# Patient Record
Sex: Female | Born: 1960
Health system: Southern US, Community
[De-identification: ages and names within clinical notes are randomized; demographics above are authoritative.]

## PROBLEM LIST (undated history)

## (undated) DIAGNOSIS — F79 Unspecified intellectual disabilities: Secondary | ICD-10-CM

## (undated) DIAGNOSIS — F419 Anxiety disorder, unspecified: Secondary | ICD-10-CM

## (undated) DIAGNOSIS — C50919 Malignant neoplasm of unspecified site of unspecified female breast: Secondary | ICD-10-CM

## (undated) DIAGNOSIS — K219 Gastro-esophageal reflux disease without esophagitis: Secondary | ICD-10-CM

## (undated) DIAGNOSIS — R1310 Dysphagia, unspecified: Secondary | ICD-10-CM

## (undated) DIAGNOSIS — C801 Malignant (primary) neoplasm, unspecified: Secondary | ICD-10-CM

## (undated) DIAGNOSIS — I10 Essential (primary) hypertension: Secondary | ICD-10-CM

## (undated) DIAGNOSIS — Z22322 Carrier or suspected carrier of Methicillin resistant Staphylococcus aureus: Secondary | ICD-10-CM

## (undated) DIAGNOSIS — F32A Depression, unspecified: Secondary | ICD-10-CM

## (undated) DIAGNOSIS — R296 Repeated falls: Secondary | ICD-10-CM

## (undated) DIAGNOSIS — M810 Age-related osteoporosis without current pathological fracture: Secondary | ICD-10-CM

## (undated) DIAGNOSIS — M1990 Unspecified osteoarthritis, unspecified site: Secondary | ICD-10-CM

## (undated) DIAGNOSIS — E785 Hyperlipidemia, unspecified: Secondary | ICD-10-CM

## (undated) DIAGNOSIS — Z8719 Personal history of other diseases of the digestive system: Secondary | ICD-10-CM

## (undated) DIAGNOSIS — L732 Hidradenitis suppurativa: Secondary | ICD-10-CM

## (undated) DIAGNOSIS — R569 Unspecified convulsions: Secondary | ICD-10-CM

## (undated) DIAGNOSIS — F329 Major depressive disorder, single episode, unspecified: Secondary | ICD-10-CM

## (undated) DIAGNOSIS — W19XXXA Unspecified fall, initial encounter: Secondary | ICD-10-CM

## (undated) DIAGNOSIS — J189 Pneumonia, unspecified organism: Secondary | ICD-10-CM

## (undated) DIAGNOSIS — H269 Unspecified cataract: Secondary | ICD-10-CM

## (undated) HISTORY — DX: Age-related osteoporosis without current pathological fracture: M81.0

## (undated) HISTORY — DX: Unspecified intellectual disabilities: F79

## (undated) HISTORY — DX: Carrier or suspected carrier of methicillin resistant Staphylococcus aureus: Z22.322

## (undated) HISTORY — DX: Hidradenitis suppurativa: L73.2

## (undated) HISTORY — DX: Essential (primary) hypertension: I10

## (undated) HISTORY — DX: Gastro-esophageal reflux disease without esophagitis: K21.9

## (undated) HISTORY — DX: Malignant (primary) neoplasm, unspecified: C80.1

## (undated) HISTORY — PX: CERVICAL ABLATION: SHX5771

## (undated) HISTORY — DX: Repeated falls: R29.6

## (undated) HISTORY — DX: Unspecified cataract: H26.9

## (undated) HISTORY — DX: Unspecified fall, initial encounter: W19.XXXA

## (undated) HISTORY — PX: OTHER SURGICAL HISTORY: SHX169

## (undated) HISTORY — DX: Unspecified convulsions: R56.9

---

## 2004-08-20 ENCOUNTER — Ambulatory Visit: Payer: Self-pay | Admitting: Family Medicine

## 2004-12-03 ENCOUNTER — Ambulatory Visit: Payer: Self-pay | Admitting: Unknown Physician Specialty

## 2004-12-07 ENCOUNTER — Ambulatory Visit: Payer: Self-pay | Admitting: Unknown Physician Specialty

## 2007-06-11 ENCOUNTER — Ambulatory Visit: Payer: Self-pay | Admitting: Family Medicine

## 2009-05-10 ENCOUNTER — Ambulatory Visit: Payer: Self-pay | Admitting: Family Medicine

## 2009-05-17 LAB — HM DEXA SCAN

## 2010-02-11 DIAGNOSIS — L732 Hidradenitis suppurativa: Secondary | ICD-10-CM

## 2010-02-11 DIAGNOSIS — Z22322 Carrier or suspected carrier of Methicillin resistant Staphylococcus aureus: Secondary | ICD-10-CM

## 2010-02-11 HISTORY — DX: Hidradenitis suppurativa: L73.2

## 2010-02-11 HISTORY — DX: Carrier or suspected carrier of methicillin resistant Staphylococcus aureus: Z22.322

## 2010-08-22 ENCOUNTER — Ambulatory Visit: Payer: Self-pay | Admitting: Family Medicine

## 2010-10-30 ENCOUNTER — Inpatient Hospital Stay: Payer: Self-pay | Admitting: Specialist

## 2011-02-20 DIAGNOSIS — L0390 Cellulitis, unspecified: Secondary | ICD-10-CM | POA: Diagnosis not present

## 2011-02-20 DIAGNOSIS — N390 Urinary tract infection, site not specified: Secondary | ICD-10-CM | POA: Diagnosis not present

## 2011-02-20 DIAGNOSIS — L0291 Cutaneous abscess, unspecified: Secondary | ICD-10-CM | POA: Diagnosis not present

## 2011-02-20 DIAGNOSIS — Q02 Microcephaly: Secondary | ICD-10-CM | POA: Diagnosis not present

## 2011-02-20 DIAGNOSIS — B953 Streptococcus pneumoniae as the cause of diseases classified elsewhere: Secondary | ICD-10-CM | POA: Diagnosis not present

## 2011-03-19 DIAGNOSIS — F39 Unspecified mood [affective] disorder: Secondary | ICD-10-CM | POA: Diagnosis not present

## 2011-04-08 DIAGNOSIS — L0390 Cellulitis, unspecified: Secondary | ICD-10-CM | POA: Diagnosis not present

## 2011-04-10 DIAGNOSIS — A4902 Methicillin resistant Staphylococcus aureus infection, unspecified site: Secondary | ICD-10-CM | POA: Diagnosis not present

## 2011-04-10 DIAGNOSIS — L02429 Furuncle of limb, unspecified: Secondary | ICD-10-CM | POA: Diagnosis not present

## 2011-04-10 DIAGNOSIS — L0291 Cutaneous abscess, unspecified: Secondary | ICD-10-CM | POA: Diagnosis not present

## 2011-04-10 DIAGNOSIS — L02229 Furuncle of trunk, unspecified: Secondary | ICD-10-CM | POA: Diagnosis not present

## 2011-04-10 DIAGNOSIS — L02439 Carbuncle of limb, unspecified: Secondary | ICD-10-CM | POA: Diagnosis not present

## 2011-04-10 DIAGNOSIS — L0390 Cellulitis, unspecified: Secondary | ICD-10-CM | POA: Diagnosis not present

## 2011-04-15 ENCOUNTER — Encounter: Payer: Self-pay | Admitting: Cardiothoracic Surgery

## 2011-04-15 ENCOUNTER — Encounter: Payer: Self-pay | Admitting: Nurse Practitioner

## 2011-04-15 DIAGNOSIS — F82 Specific developmental disorder of motor function: Secondary | ICD-10-CM | POA: Diagnosis not present

## 2011-04-15 DIAGNOSIS — F72 Severe intellectual disabilities: Secondary | ICD-10-CM | POA: Diagnosis not present

## 2011-04-15 DIAGNOSIS — L732 Hidradenitis suppurativa: Secondary | ICD-10-CM | POA: Diagnosis not present

## 2011-04-15 DIAGNOSIS — F411 Generalized anxiety disorder: Secondary | ICD-10-CM | POA: Diagnosis not present

## 2011-04-17 DIAGNOSIS — H543 Unqualified visual loss, both eyes: Secondary | ICD-10-CM | POA: Diagnosis not present

## 2011-04-17 DIAGNOSIS — F82 Specific developmental disorder of motor function: Secondary | ICD-10-CM | POA: Diagnosis not present

## 2011-04-17 DIAGNOSIS — F72 Severe intellectual disabilities: Secondary | ICD-10-CM | POA: Diagnosis not present

## 2011-04-17 DIAGNOSIS — F411 Generalized anxiety disorder: Secondary | ICD-10-CM | POA: Diagnosis not present

## 2011-04-17 DIAGNOSIS — L732 Hidradenitis suppurativa: Secondary | ICD-10-CM | POA: Diagnosis not present

## 2011-04-19 DIAGNOSIS — F72 Severe intellectual disabilities: Secondary | ICD-10-CM | POA: Diagnosis not present

## 2011-04-19 DIAGNOSIS — L732 Hidradenitis suppurativa: Secondary | ICD-10-CM | POA: Diagnosis not present

## 2011-04-19 DIAGNOSIS — H543 Unqualified visual loss, both eyes: Secondary | ICD-10-CM | POA: Diagnosis not present

## 2011-04-19 DIAGNOSIS — F411 Generalized anxiety disorder: Secondary | ICD-10-CM | POA: Diagnosis not present

## 2011-04-19 DIAGNOSIS — F82 Specific developmental disorder of motor function: Secondary | ICD-10-CM | POA: Diagnosis not present

## 2011-04-23 DIAGNOSIS — F72 Severe intellectual disabilities: Secondary | ICD-10-CM | POA: Diagnosis not present

## 2011-04-23 DIAGNOSIS — L732 Hidradenitis suppurativa: Secondary | ICD-10-CM | POA: Diagnosis not present

## 2011-04-23 DIAGNOSIS — F82 Specific developmental disorder of motor function: Secondary | ICD-10-CM | POA: Diagnosis not present

## 2011-04-23 DIAGNOSIS — F411 Generalized anxiety disorder: Secondary | ICD-10-CM | POA: Diagnosis not present

## 2011-04-23 DIAGNOSIS — H543 Unqualified visual loss, both eyes: Secondary | ICD-10-CM | POA: Diagnosis not present

## 2011-04-26 DIAGNOSIS — L732 Hidradenitis suppurativa: Secondary | ICD-10-CM | POA: Diagnosis not present

## 2011-04-26 DIAGNOSIS — F411 Generalized anxiety disorder: Secondary | ICD-10-CM | POA: Diagnosis not present

## 2011-04-26 DIAGNOSIS — H543 Unqualified visual loss, both eyes: Secondary | ICD-10-CM | POA: Diagnosis not present

## 2011-04-26 DIAGNOSIS — F82 Specific developmental disorder of motor function: Secondary | ICD-10-CM | POA: Diagnosis not present

## 2011-04-26 DIAGNOSIS — F72 Severe intellectual disabilities: Secondary | ICD-10-CM | POA: Diagnosis not present

## 2011-05-03 DIAGNOSIS — F72 Severe intellectual disabilities: Secondary | ICD-10-CM | POA: Diagnosis not present

## 2011-05-03 DIAGNOSIS — F82 Specific developmental disorder of motor function: Secondary | ICD-10-CM | POA: Diagnosis not present

## 2011-05-03 DIAGNOSIS — F411 Generalized anxiety disorder: Secondary | ICD-10-CM | POA: Diagnosis not present

## 2011-05-03 DIAGNOSIS — H543 Unqualified visual loss, both eyes: Secondary | ICD-10-CM | POA: Diagnosis not present

## 2011-05-03 DIAGNOSIS — L732 Hidradenitis suppurativa: Secondary | ICD-10-CM | POA: Diagnosis not present

## 2011-05-13 ENCOUNTER — Encounter: Payer: Self-pay | Admitting: Cardiothoracic Surgery

## 2011-05-13 ENCOUNTER — Encounter: Payer: Self-pay | Admitting: Nurse Practitioner

## 2011-05-13 DIAGNOSIS — F411 Generalized anxiety disorder: Secondary | ICD-10-CM | POA: Diagnosis not present

## 2011-05-13 DIAGNOSIS — L732 Hidradenitis suppurativa: Secondary | ICD-10-CM | POA: Diagnosis not present

## 2011-05-13 DIAGNOSIS — F72 Severe intellectual disabilities: Secondary | ICD-10-CM | POA: Diagnosis not present

## 2011-05-13 DIAGNOSIS — F82 Specific developmental disorder of motor function: Secondary | ICD-10-CM | POA: Diagnosis not present

## 2011-05-27 DIAGNOSIS — E7800 Pure hypercholesterolemia, unspecified: Secondary | ICD-10-CM | POA: Diagnosis not present

## 2011-05-27 DIAGNOSIS — K2100 Gastro-esophageal reflux disease with esophagitis, without bleeding: Secondary | ICD-10-CM | POA: Diagnosis not present

## 2011-05-27 DIAGNOSIS — F3289 Other specified depressive episodes: Secondary | ICD-10-CM | POA: Diagnosis not present

## 2011-05-27 DIAGNOSIS — F329 Major depressive disorder, single episode, unspecified: Secondary | ICD-10-CM | POA: Diagnosis not present

## 2011-05-27 DIAGNOSIS — F411 Generalized anxiety disorder: Secondary | ICD-10-CM | POA: Diagnosis not present

## 2011-06-18 DIAGNOSIS — F39 Unspecified mood [affective] disorder: Secondary | ICD-10-CM | POA: Diagnosis not present

## 2011-09-04 DIAGNOSIS — E7800 Pure hypercholesterolemia, unspecified: Secondary | ICD-10-CM | POA: Diagnosis not present

## 2011-09-04 DIAGNOSIS — Q02 Microcephaly: Secondary | ICD-10-CM | POA: Diagnosis not present

## 2011-09-04 DIAGNOSIS — F79 Unspecified intellectual disabilities: Secondary | ICD-10-CM | POA: Diagnosis not present

## 2011-09-04 DIAGNOSIS — R259 Unspecified abnormal involuntary movements: Secondary | ICD-10-CM | POA: Diagnosis not present

## 2011-09-12 DIAGNOSIS — E785 Hyperlipidemia, unspecified: Secondary | ICD-10-CM | POA: Diagnosis not present

## 2011-09-12 DIAGNOSIS — I10 Essential (primary) hypertension: Secondary | ICD-10-CM | POA: Diagnosis not present

## 2011-09-12 DIAGNOSIS — R5381 Other malaise: Secondary | ICD-10-CM | POA: Diagnosis not present

## 2011-09-12 DIAGNOSIS — G40909 Epilepsy, unspecified, not intractable, without status epilepticus: Secondary | ICD-10-CM | POA: Diagnosis not present

## 2011-09-17 DIAGNOSIS — F39 Unspecified mood [affective] disorder: Secondary | ICD-10-CM | POA: Diagnosis not present

## 2011-10-10 ENCOUNTER — Ambulatory Visit: Payer: Self-pay | Admitting: Family Medicine

## 2011-10-10 DIAGNOSIS — Z1231 Encounter for screening mammogram for malignant neoplasm of breast: Secondary | ICD-10-CM | POA: Diagnosis not present

## 2011-10-28 DIAGNOSIS — I10 Essential (primary) hypertension: Secondary | ICD-10-CM | POA: Diagnosis not present

## 2011-10-28 DIAGNOSIS — F79 Unspecified intellectual disabilities: Secondary | ICD-10-CM | POA: Diagnosis not present

## 2011-10-28 DIAGNOSIS — R5381 Other malaise: Secondary | ICD-10-CM | POA: Diagnosis not present

## 2011-10-28 DIAGNOSIS — R5383 Other fatigue: Secondary | ICD-10-CM | POA: Diagnosis not present

## 2011-10-28 DIAGNOSIS — E7800 Pure hypercholesterolemia, unspecified: Secondary | ICD-10-CM | POA: Diagnosis not present

## 2011-11-28 DIAGNOSIS — F79 Unspecified intellectual disabilities: Secondary | ICD-10-CM | POA: Diagnosis not present

## 2011-11-28 DIAGNOSIS — E7800 Pure hypercholesterolemia, unspecified: Secondary | ICD-10-CM | POA: Diagnosis not present

## 2011-11-28 DIAGNOSIS — Z23 Encounter for immunization: Secondary | ICD-10-CM | POA: Diagnosis not present

## 2011-12-04 DIAGNOSIS — R569 Unspecified convulsions: Secondary | ICD-10-CM | POA: Diagnosis not present

## 2011-12-04 DIAGNOSIS — F919 Conduct disorder, unspecified: Secondary | ICD-10-CM | POA: Diagnosis not present

## 2011-12-07 ENCOUNTER — Emergency Department: Payer: Self-pay | Admitting: Emergency Medicine

## 2011-12-07 DIAGNOSIS — I10 Essential (primary) hypertension: Secondary | ICD-10-CM | POA: Diagnosis not present

## 2011-12-07 DIAGNOSIS — N200 Calculus of kidney: Secondary | ICD-10-CM | POA: Diagnosis not present

## 2011-12-07 DIAGNOSIS — R1011 Right upper quadrant pain: Secondary | ICD-10-CM | POA: Diagnosis not present

## 2011-12-07 DIAGNOSIS — R509 Fever, unspecified: Secondary | ICD-10-CM | POA: Diagnosis not present

## 2011-12-07 DIAGNOSIS — R9431 Abnormal electrocardiogram [ECG] [EKG]: Secondary | ICD-10-CM | POA: Diagnosis not present

## 2011-12-07 DIAGNOSIS — R109 Unspecified abdominal pain: Secondary | ICD-10-CM | POA: Diagnosis not present

## 2011-12-07 DIAGNOSIS — Z79899 Other long term (current) drug therapy: Secondary | ICD-10-CM | POA: Diagnosis not present

## 2011-12-07 LAB — COMPREHENSIVE METABOLIC PANEL
Albumin: 4.2 g/dL (ref 3.4–5.0)
Alkaline Phosphatase: 115 U/L (ref 50–136)
BUN: 14 mg/dL (ref 7–18)
Bilirubin,Total: 0.3 mg/dL (ref 0.2–1.0)
Co2: 30 mmol/L (ref 21–32)
Creatinine: 0.57 mg/dL — ABNORMAL LOW (ref 0.60–1.30)
EGFR (African American): >60
Glucose: 119 mg/dL — ABNORMAL HIGH (ref 65–99)
SGOT(AST): 30 U/L (ref 15–37)
SGPT (ALT): 31 U/L (ref 12–78)
Total Protein: 7.1 g/dL (ref 6.4–8.2)

## 2011-12-07 LAB — CBC
MCHC: 33.5 g/dL (ref 32.0–36.0)
Platelet: 248 10*3/uL (ref 150–440)
RBC: 4.24 10*6/uL (ref 3.80–5.20)
RDW: 13.8 % (ref 11.5–14.5)

## 2011-12-07 LAB — URINALYSIS, COMPLETE
Bilirubin,UR: NEGATIVE
Glucose,UR: NEGATIVE mg/dL (ref 0–75)
Protein: NEGATIVE
RBC,UR: 68 /HPF (ref 0–5)
Specific Gravity: 1.017 (ref 1.003–1.030)
Squamous Epithelial: 4
WBC UR: NONE SEEN /HPF (ref 0–5)

## 2011-12-07 LAB — LIPASE, BLOOD: Lipase: 176 U/L (ref 73–393)

## 2011-12-13 DIAGNOSIS — E7800 Pure hypercholesterolemia, unspecified: Secondary | ICD-10-CM | POA: Diagnosis not present

## 2011-12-13 DIAGNOSIS — R1310 Dysphagia, unspecified: Secondary | ICD-10-CM | POA: Diagnosis not present

## 2011-12-13 DIAGNOSIS — F09 Unspecified mental disorder due to known physiological condition: Secondary | ICD-10-CM | POA: Diagnosis not present

## 2011-12-13 DIAGNOSIS — N200 Calculus of kidney: Secondary | ICD-10-CM | POA: Diagnosis not present

## 2011-12-26 ENCOUNTER — Ambulatory Visit: Payer: Self-pay | Admitting: Family Medicine

## 2011-12-26 DIAGNOSIS — R1310 Dysphagia, unspecified: Secondary | ICD-10-CM | POA: Diagnosis not present

## 2011-12-28 ENCOUNTER — Emergency Department: Payer: Self-pay | Admitting: Unknown Physician Specialty

## 2011-12-28 DIAGNOSIS — R569 Unspecified convulsions: Secondary | ICD-10-CM | POA: Diagnosis not present

## 2011-12-28 DIAGNOSIS — I10 Essential (primary) hypertension: Secondary | ICD-10-CM | POA: Diagnosis not present

## 2011-12-28 DIAGNOSIS — G40909 Epilepsy, unspecified, not intractable, without status epilepticus: Secondary | ICD-10-CM | POA: Diagnosis not present

## 2011-12-28 DIAGNOSIS — I498 Other specified cardiac arrhythmias: Secondary | ICD-10-CM | POA: Diagnosis not present

## 2011-12-28 LAB — COMPREHENSIVE METABOLIC PANEL
Alkaline Phosphatase: 112 U/L (ref 50–136)
BUN: 14 mg/dL (ref 7–18)
Bilirubin,Total: 0.4 mg/dL (ref 0.2–1.0)
Calcium, Total: 8.9 mg/dL (ref 8.5–10.1)
Chloride: 100 mmol/L (ref 98–107)
Co2: 24 mmol/L (ref 21–32)
Creatinine: 0.66 mg/dL (ref 0.60–1.30)
Osmolality: 277 (ref 275–301)
SGPT (ALT): 56 U/L (ref 12–78)
Total Protein: 7.5 g/dL (ref 6.4–8.2)

## 2011-12-28 LAB — CBC
HCT: 42.6 % (ref 35.0–47.0)
MCH: 31.5 pg (ref 26.0–34.0)
MCHC: 32.9 g/dL (ref 32.0–36.0)
MCV: 96 fL (ref 80–100)
Platelet: 269 10*3/uL (ref 150–440)
RBC: 4.45 10*6/uL (ref 3.80–5.20)
WBC: 11.2 10*3/uL — ABNORMAL HIGH (ref 3.6–11.0)

## 2011-12-28 LAB — URINALYSIS, COMPLETE
Ketone: NEGATIVE
Nitrite: NEGATIVE
Ph: 7 (ref 4.5–8.0)
Protein: NEGATIVE
RBC,UR: 30 /HPF (ref 0–5)
Specific Gravity: 1.008 (ref 1.003–1.030)
Squamous Epithelial: <1

## 2011-12-28 LAB — TSH: Thyroid Stimulating Horm: 2.55 u[IU]/mL

## 2011-12-28 LAB — PHENYTOIN LEVEL, TOTAL: Dilantin: 2.1 ug/mL — ABNORMAL LOW (ref 10.0–20.0)

## 2011-12-28 LAB — TROPONIN I: Troponin-I: <0.02 ng/mL

## 2012-01-02 DIAGNOSIS — G40919 Epilepsy, unspecified, intractable, without status epilepticus: Secondary | ICD-10-CM | POA: Diagnosis not present

## 2012-01-02 DIAGNOSIS — G808 Other cerebral palsy: Secondary | ICD-10-CM | POA: Diagnosis not present

## 2012-01-25 DIAGNOSIS — I10 Essential (primary) hypertension: Secondary | ICD-10-CM | POA: Diagnosis not present

## 2012-01-25 DIAGNOSIS — R1312 Dysphagia, oropharyngeal phase: Secondary | ICD-10-CM | POA: Diagnosis not present

## 2012-01-25 DIAGNOSIS — R569 Unspecified convulsions: Secondary | ICD-10-CM | POA: Diagnosis not present

## 2012-01-25 DIAGNOSIS — F79 Unspecified intellectual disabilities: Secondary | ICD-10-CM | POA: Diagnosis not present

## 2012-01-25 DIAGNOSIS — IMO0001 Reserved for inherently not codable concepts without codable children: Secondary | ICD-10-CM | POA: Diagnosis not present

## 2012-02-20 DIAGNOSIS — F79 Unspecified intellectual disabilities: Secondary | ICD-10-CM | POA: Diagnosis not present

## 2012-02-20 DIAGNOSIS — I10 Essential (primary) hypertension: Secondary | ICD-10-CM | POA: Diagnosis not present

## 2012-02-20 DIAGNOSIS — R1312 Dysphagia, oropharyngeal phase: Secondary | ICD-10-CM | POA: Diagnosis not present

## 2012-02-20 DIAGNOSIS — R569 Unspecified convulsions: Secondary | ICD-10-CM | POA: Diagnosis not present

## 2012-02-25 DIAGNOSIS — W19XXXA Unspecified fall, initial encounter: Secondary | ICD-10-CM | POA: Diagnosis not present

## 2012-02-25 DIAGNOSIS — F79 Unspecified intellectual disabilities: Secondary | ICD-10-CM | POA: Diagnosis not present

## 2012-03-03 DIAGNOSIS — R300 Dysuria: Secondary | ICD-10-CM | POA: Diagnosis not present

## 2012-03-03 DIAGNOSIS — R112 Nausea with vomiting, unspecified: Secondary | ICD-10-CM | POA: Diagnosis not present

## 2012-03-17 DIAGNOSIS — F39 Unspecified mood [affective] disorder: Secondary | ICD-10-CM | POA: Diagnosis not present

## 2012-03-19 DIAGNOSIS — R059 Cough, unspecified: Secondary | ICD-10-CM | POA: Diagnosis not present

## 2012-03-19 DIAGNOSIS — J209 Acute bronchitis, unspecified: Secondary | ICD-10-CM | POA: Diagnosis not present

## 2012-03-19 DIAGNOSIS — J988 Other specified respiratory disorders: Secondary | ICD-10-CM | POA: Diagnosis not present

## 2012-03-19 DIAGNOSIS — R05 Cough: Secondary | ICD-10-CM | POA: Diagnosis not present

## 2012-03-19 DIAGNOSIS — R569 Unspecified convulsions: Secondary | ICD-10-CM | POA: Diagnosis not present

## 2012-03-20 DIAGNOSIS — G40219 Localization-related (focal) (partial) symptomatic epilepsy and epileptic syndromes with complex partial seizures, intractable, without status epilepticus: Secondary | ICD-10-CM | POA: Diagnosis not present

## 2012-03-25 DIAGNOSIS — Z711 Person with feared health complaint in whom no diagnosis is made: Secondary | ICD-10-CM | POA: Diagnosis not present

## 2012-05-20 DIAGNOSIS — Z5181 Encounter for therapeutic drug level monitoring: Secondary | ICD-10-CM | POA: Diagnosis not present

## 2012-05-20 DIAGNOSIS — G40909 Epilepsy, unspecified, not intractable, without status epilepticus: Secondary | ICD-10-CM | POA: Diagnosis not present

## 2012-05-28 DIAGNOSIS — K2100 Gastro-esophageal reflux disease with esophagitis, without bleeding: Secondary | ICD-10-CM | POA: Diagnosis not present

## 2012-05-28 DIAGNOSIS — E7800 Pure hypercholesterolemia, unspecified: Secondary | ICD-10-CM | POA: Diagnosis not present

## 2012-05-28 DIAGNOSIS — E785 Hyperlipidemia, unspecified: Secondary | ICD-10-CM | POA: Diagnosis not present

## 2012-05-28 DIAGNOSIS — G40909 Epilepsy, unspecified, not intractable, without status epilepticus: Secondary | ICD-10-CM | POA: Diagnosis not present

## 2012-05-28 DIAGNOSIS — I10 Essential (primary) hypertension: Secondary | ICD-10-CM | POA: Diagnosis not present

## 2012-05-28 DIAGNOSIS — Z0000 Encounter for general adult medical examination without abnormal findings: Secondary | ICD-10-CM | POA: Diagnosis not present

## 2012-05-28 DIAGNOSIS — R109 Unspecified abdominal pain: Secondary | ICD-10-CM | POA: Diagnosis not present

## 2012-05-28 DIAGNOSIS — Z1159 Encounter for screening for other viral diseases: Secondary | ICD-10-CM | POA: Diagnosis not present

## 2012-05-28 DIAGNOSIS — R5381 Other malaise: Secondary | ICD-10-CM | POA: Diagnosis not present

## 2012-07-13 DIAGNOSIS — Z711 Person with feared health complaint in whom no diagnosis is made: Secondary | ICD-10-CM | POA: Diagnosis not present

## 2012-07-24 DIAGNOSIS — Z1339 Encounter for screening examination for other mental health and behavioral disorders: Secondary | ICD-10-CM | POA: Diagnosis not present

## 2012-07-24 DIAGNOSIS — E7800 Pure hypercholesterolemia, unspecified: Secondary | ICD-10-CM | POA: Diagnosis not present

## 2012-07-24 DIAGNOSIS — K2100 Gastro-esophageal reflux disease with esophagitis, without bleeding: Secondary | ICD-10-CM | POA: Diagnosis not present

## 2012-07-24 DIAGNOSIS — R1084 Generalized abdominal pain: Secondary | ICD-10-CM | POA: Diagnosis not present

## 2012-09-15 DIAGNOSIS — H109 Unspecified conjunctivitis: Secondary | ICD-10-CM | POA: Diagnosis not present

## 2012-10-13 ENCOUNTER — Ambulatory Visit: Payer: Self-pay | Admitting: Family Medicine

## 2012-10-13 DIAGNOSIS — Z1231 Encounter for screening mammogram for malignant neoplasm of breast: Secondary | ICD-10-CM | POA: Diagnosis not present

## 2012-10-13 DIAGNOSIS — R928 Other abnormal and inconclusive findings on diagnostic imaging of breast: Secondary | ICD-10-CM | POA: Diagnosis not present

## 2012-10-29 ENCOUNTER — Ambulatory Visit: Payer: Self-pay | Admitting: Family Medicine

## 2012-10-29 DIAGNOSIS — R928 Other abnormal and inconclusive findings on diagnostic imaging of breast: Secondary | ICD-10-CM | POA: Diagnosis not present

## 2012-10-29 DIAGNOSIS — N6489 Other specified disorders of breast: Secondary | ICD-10-CM | POA: Diagnosis not present

## 2012-10-29 DIAGNOSIS — N63 Unspecified lump in unspecified breast: Secondary | ICD-10-CM | POA: Diagnosis not present

## 2012-11-25 DIAGNOSIS — F79 Unspecified intellectual disabilities: Secondary | ICD-10-CM | POA: Diagnosis not present

## 2012-11-25 DIAGNOSIS — G40909 Epilepsy, unspecified, not intractable, without status epilepticus: Secondary | ICD-10-CM | POA: Diagnosis not present

## 2012-12-15 DIAGNOSIS — F39 Unspecified mood [affective] disorder: Secondary | ICD-10-CM | POA: Diagnosis not present

## 2012-12-30 DIAGNOSIS — E7800 Pure hypercholesterolemia, unspecified: Secondary | ICD-10-CM | POA: Diagnosis not present

## 2012-12-30 DIAGNOSIS — K2100 Gastro-esophageal reflux disease with esophagitis, without bleeding: Secondary | ICD-10-CM | POA: Diagnosis not present

## 2012-12-30 DIAGNOSIS — Z23 Encounter for immunization: Secondary | ICD-10-CM | POA: Diagnosis not present

## 2012-12-30 DIAGNOSIS — R1084 Generalized abdominal pain: Secondary | ICD-10-CM | POA: Diagnosis not present

## 2012-12-30 DIAGNOSIS — Z1339 Encounter for screening examination for other mental health and behavioral disorders: Secondary | ICD-10-CM | POA: Diagnosis not present

## 2013-01-08 ENCOUNTER — Emergency Department: Payer: Self-pay | Admitting: Emergency Medicine

## 2013-01-08 DIAGNOSIS — R51 Headache: Secondary | ICD-10-CM | POA: Diagnosis not present

## 2013-01-08 DIAGNOSIS — R109 Unspecified abdominal pain: Secondary | ICD-10-CM | POA: Diagnosis not present

## 2013-01-08 LAB — URINALYSIS, COMPLETE
Leukocyte Esterase: NEGATIVE
Nitrite: NEGATIVE
Ph: 5 (ref 4.5–8.0)
RBC,UR: 32 /HPF (ref 0–5)
Specific Gravity: 1.023 (ref 1.003–1.030)
Squamous Epithelial: 1

## 2013-01-08 LAB — COMPREHENSIVE METABOLIC PANEL
Alkaline Phosphatase: 108 U/L
Anion Gap: 2 — ABNORMAL LOW (ref 7–16)
BUN: 21 mg/dL — ABNORMAL HIGH (ref 7–18)
Bilirubin,Total: 0.4 mg/dL (ref 0.2–1.0)
Calcium, Total: 9.6 mg/dL (ref 8.5–10.1)
Chloride: 102 mmol/L (ref 98–107)
Co2: 32 mmol/L (ref 21–32)
EGFR (Non-African Amer.): >60
Glucose: 122 mg/dL — ABNORMAL HIGH (ref 65–99)
Potassium: 4 mmol/L (ref 3.5–5.1)
SGOT(AST): 39 U/L — ABNORMAL HIGH (ref 15–37)
Sodium: 136 mmol/L (ref 136–145)

## 2013-01-08 LAB — CBC
HCT: 39.5 % (ref 35.0–47.0)
HGB: 13.1 g/dL (ref 12.0–16.0)
MCH: 30.2 pg (ref 26.0–34.0)
MCHC: 33.2 g/dL (ref 32.0–36.0)
MCV: 91 fL (ref 80–100)
RDW: 14.2 % (ref 11.5–14.5)
WBC: 10.7 10*3/uL (ref 3.6–11.0)

## 2013-02-11 DIAGNOSIS — C50919 Malignant neoplasm of unspecified site of unspecified female breast: Secondary | ICD-10-CM

## 2013-02-11 HISTORY — DX: Malignant neoplasm of unspecified site of unspecified female breast: C50.919

## 2013-03-01 DIAGNOSIS — E7800 Pure hypercholesterolemia, unspecified: Secondary | ICD-10-CM | POA: Diagnosis not present

## 2013-03-01 DIAGNOSIS — K2100 Gastro-esophageal reflux disease with esophagitis, without bleeding: Secondary | ICD-10-CM | POA: Diagnosis not present

## 2013-03-01 DIAGNOSIS — I10 Essential (primary) hypertension: Secondary | ICD-10-CM | POA: Diagnosis not present

## 2013-03-01 DIAGNOSIS — R32 Unspecified urinary incontinence: Secondary | ICD-10-CM | POA: Diagnosis not present

## 2013-03-01 DIAGNOSIS — Z23 Encounter for immunization: Secondary | ICD-10-CM | POA: Diagnosis not present

## 2013-03-22 DIAGNOSIS — F79 Unspecified intellectual disabilities: Secondary | ICD-10-CM | POA: Diagnosis not present

## 2013-03-22 DIAGNOSIS — Z8279 Family history of other congenital malformations, deformations and chromosomal abnormalities: Secondary | ICD-10-CM | POA: Diagnosis not present

## 2013-06-08 DIAGNOSIS — Z0000 Encounter for general adult medical examination without abnormal findings: Secondary | ICD-10-CM | POA: Diagnosis not present

## 2013-06-08 DIAGNOSIS — I10 Essential (primary) hypertension: Secondary | ICD-10-CM | POA: Diagnosis not present

## 2013-06-08 DIAGNOSIS — Z23 Encounter for immunization: Secondary | ICD-10-CM | POA: Diagnosis not present

## 2013-06-08 DIAGNOSIS — K2100 Gastro-esophageal reflux disease with esophagitis, without bleeding: Secondary | ICD-10-CM | POA: Diagnosis not present

## 2013-06-08 DIAGNOSIS — E7800 Pure hypercholesterolemia, unspecified: Secondary | ICD-10-CM | POA: Diagnosis not present

## 2013-06-08 DIAGNOSIS — Z1339 Encounter for screening examination for other mental health and behavioral disorders: Secondary | ICD-10-CM | POA: Diagnosis not present

## 2013-06-08 DIAGNOSIS — Z1331 Encounter for screening for depression: Secondary | ICD-10-CM | POA: Diagnosis not present

## 2013-06-15 DIAGNOSIS — F39 Unspecified mood [affective] disorder: Secondary | ICD-10-CM | POA: Diagnosis not present

## 2013-06-17 DIAGNOSIS — R109 Unspecified abdominal pain: Secondary | ICD-10-CM | POA: Diagnosis not present

## 2013-06-17 DIAGNOSIS — R5381 Other malaise: Secondary | ICD-10-CM | POA: Diagnosis not present

## 2013-06-17 DIAGNOSIS — A088 Other specified intestinal infections: Secondary | ICD-10-CM | POA: Diagnosis not present

## 2013-06-17 DIAGNOSIS — I10 Essential (primary) hypertension: Secondary | ICD-10-CM | POA: Diagnosis not present

## 2013-06-17 DIAGNOSIS — R5383 Other fatigue: Secondary | ICD-10-CM | POA: Diagnosis not present

## 2013-06-17 LAB — BASIC METABOLIC PANEL
BUN: 13 mg/dL (ref 4–21)
CREATININE: 0.6 mg/dL (ref 0.5–1.1)
Glucose: 112 mg/dL
SODIUM: 146 mmol/L (ref 137–147)

## 2013-06-17 LAB — CBC AND DIFFERENTIAL
NEUTROS ABS: 3 /uL
WBC: 6.8 10^3/mL

## 2013-09-30 DIAGNOSIS — R569 Unspecified convulsions: Secondary | ICD-10-CM | POA: Insufficient documentation

## 2013-09-30 DIAGNOSIS — F79 Unspecified intellectual disabilities: Secondary | ICD-10-CM | POA: Diagnosis not present

## 2013-10-05 DIAGNOSIS — L732 Hidradenitis suppurativa: Secondary | ICD-10-CM | POA: Diagnosis not present

## 2013-10-05 DIAGNOSIS — B353 Tinea pedis: Secondary | ICD-10-CM | POA: Diagnosis not present

## 2013-10-08 DIAGNOSIS — R569 Unspecified convulsions: Secondary | ICD-10-CM | POA: Diagnosis not present

## 2013-10-22 DIAGNOSIS — F79 Unspecified intellectual disabilities: Secondary | ICD-10-CM | POA: Diagnosis not present

## 2013-10-22 DIAGNOSIS — R569 Unspecified convulsions: Secondary | ICD-10-CM | POA: Diagnosis not present

## 2013-11-04 ENCOUNTER — Ambulatory Visit: Payer: Self-pay | Admitting: Family Medicine

## 2013-11-04 DIAGNOSIS — N6459 Other signs and symptoms in breast: Secondary | ICD-10-CM | POA: Diagnosis not present

## 2013-11-04 DIAGNOSIS — N63 Unspecified lump in unspecified breast: Secondary | ICD-10-CM | POA: Diagnosis not present

## 2013-11-11 DIAGNOSIS — C801 Malignant (primary) neoplasm, unspecified: Secondary | ICD-10-CM

## 2013-11-11 HISTORY — DX: Malignant (primary) neoplasm, unspecified: C80.1

## 2013-11-16 ENCOUNTER — Ambulatory Visit: Payer: Medicare Other

## 2013-11-16 ENCOUNTER — Ambulatory Visit (INDEPENDENT_AMBULATORY_CARE_PROVIDER_SITE_OTHER): Payer: Medicare Other | Admitting: General Surgery

## 2013-11-16 ENCOUNTER — Encounter: Payer: Self-pay | Admitting: General Surgery

## 2013-11-16 VITALS — BP 120/72 | HR 86 | Resp 14 | Ht <= 58 in | Wt 149.0 lb

## 2013-11-16 DIAGNOSIS — N6320 Unspecified lump in the left breast, unspecified quadrant: Secondary | ICD-10-CM | POA: Insufficient documentation

## 2013-11-16 DIAGNOSIS — N63 Unspecified lump in breast: Secondary | ICD-10-CM | POA: Diagnosis not present

## 2013-11-16 DIAGNOSIS — C50812 Malignant neoplasm of overlapping sites of left female breast: Secondary | ICD-10-CM | POA: Diagnosis not present

## 2013-11-16 DIAGNOSIS — F79 Unspecified intellectual disabilities: Secondary | ICD-10-CM | POA: Insufficient documentation

## 2013-11-16 NOTE — Progress Notes (Signed)
Patient ID: Helen Patton, female   DOB: [birth year], 53 y.o.   MRN: [id number]  Chief Complaint  Patient presents with  . Breast Problem    abnormal mammogram    HPI Helen Patton is a 53 y.o. female.  who presents for a breast evaluation. The most recent mammogram and ultrasound was done on 11-04-13.  Patient does perform regular self breast checks with the help of group home staff and gets regular mammograms done.   Here today with her mom, Tonnie and [place], [name]. The patient does not talk much and there has not been any noted trauma or injury. 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 Report from the group home enter is that she had a sister who died at 50 months from heart problems and a brother who has also passed. Family history of breast cancer includes and Aunt at age 53. The patient functions at approximately the level of a 73-year-old by report of the group home manager who is been caring for for 20 years. She is able to ambulate, she can't follow instructions, she is essentially nonverbal except for grunts and squeaks.   HPI  Past Medical History  Diagnosis Date  . Mental retardation   . GERD (gastroesophageal reflux disease)   . Osteoporosis   . Hypertension   . Seizures   . MRSA (methicillin resistant staph aureus) culture positive 2012  . Hidradenitis 2012    MRSA, on chronic topical suppression    Past Surgical History  Procedure Laterality Date  . Broken leg    . Dislocated hip    . Cervical ablation      Family History  Problem Relation Age of Onset  . Diabetes Father   . Cancer Maternal Aunt 63    breast    Social History History  Substance Use Topics  . Smoking status: Never Smoker   . Smokeless tobacco: Never Used  . Alcohol Use: No    Allergies  Allergen Reactions  . Sulfa Antibiotics   . Keflex [Cephalexin] Other (See Comments)    unknown    Current Outpatient Prescriptions   Medication Sig Dispense Refill  . alendronate (FOSAMAX) 70 MG tablet Take 70 mg by mouth once a week. Take with a full glass of water on an empty stomach.      Marland Kitchen amLODipine (NORVASC) 10 MG tablet Take 10 mg by mouth daily.      . benzoyl peroxide 5 % external liquid Apply topically at bedtime.      . calcium-vitamin D (OSCAL WITH D) 500-200 MG-UNIT per tablet Take 1 tablet by mouth daily with breakfast.      . clindamycin (CLEOCIN T) 1 % SWAB Apply topically daily.      Marland Kitchen docusate sodium (COLACE) 100 MG capsule Take 100 mg by mouth 2 (two) times daily.      . DULoxetine (CYMBALTA) 60 MG capsule Take 60 mg by mouth daily.      Marland Kitchen lamoTRIgine (LAMICTAL) 150 MG tablet Take 150 mg by mouth 2 (two) times daily.      Marland Kitchen loratadine (CLARITIN) 10 MG tablet Take 10 mg by mouth daily.      Marland Kitchen LORazepam (ATIVAN) 1 MG tablet Take 1 mg by mouth every 8 (eight) hours as needed for anxiety.      Marland Kitchen losartan (COZAAR) 100 MG tablet Take 100 mg by mouth daily.      Marland Kitchen omeprazole (PRILOSEC) 20 MG capsule Take  20 mg by mouth daily.      Marland Kitchen terbinafine (LAMISIL) 1 % cream Apply 1 application topically 2 (two) times a week.       No current facility-administered medications for this visit.    Review of Systems Review of Systems  Constitutional: Negative.   Respiratory: Negative.   Cardiovascular: Negative.     Blood pressure 120/72, pulse 86, resp. rate 14, height 4\' 10"  (1.473 m), weight 149 lb (67.586 kg).  Physical Exam Physical Exam  Constitutional: She appears well-developed and well-nourished.  Neck: Neck supple.  Cardiovascular: Normal rate, regular rhythm and normal heart sounds.   Pulmonary/Chest: Effort normal and breath sounds normal. Right breast exhibits no inverted nipple, no mass, no nipple discharge, no skin change and no tenderness. Left breast exhibits no inverted nipple, no mass, no nipple discharge, no skin change and no tenderness.    Lymphadenopathy:    She has no cervical adenopathy.     She has no axillary adenopathy.  Neurological: She is alert.  Skin: Skin is warm and dry.    Data Reviewed 2014 2015 mammograms reviewed. 2015 ultrasound of September 24 reviewed showing a indeterminate breast mass at 6:00 and likely complex cyst at 4:00. BI-RAD-4.  PCP notes of 06/17/2013 reviewed.  Laboratory studies of 06/17/2013 showed a hemoglobin of 12.2 with an MCV of 90, white blood cell count 6800, normal comprehensive metabolic panel with exception of mild elevation of serum sodium of 146. Normal liver function studies.   Ultrasound examination of the left breast showed the cystic structure at the 4:00 position. At the 6:00 position there is an ill-defined area of hypoechoic tissue with acoustic shadowing measuring 0.3 x 0.5 x 0.7 cm at the 6:00 position, 4 cm from the nipple. This appears to correlate with the hospital ultrasound.  The patient's mother and the group home caregiver were amenable to attempt to biopsy. The patient tolerated the procedure exceptionally well. 10 cc of 0.5% Xylocaine with 0.25% Marcaine with 1-200,000 units of epinephrine was utilized well tolerated. ChloraPrep was applied to the skin. A 14-gauge Finesse device was used approximately a core samples obtained. A postbiopsy clip placed. Skin defect closed with benzoin and Steri-Strips followed by Telfa and Tegaderm dressing.  Postbiopsy  instructions reviewed with the group home enter.  Assessment     abnormal left mammogram/ultrasound.    Plan     The patient's mother will be contacted with the results when available. If benign results are obtained we'll plan for followup mammogram in 6 months. If abnormal results are obtained we'll arrange to discuss with the patient's mother options for management.         PCP/Ref Dr. Kathyrn Drown, [place] 11/16/2013, 10:40 AM

## 2013-11-16 NOTE — Patient Instructions (Signed)

## 2013-11-18 ENCOUNTER — Ambulatory Visit (INDEPENDENT_AMBULATORY_CARE_PROVIDER_SITE_OTHER): Payer: Medicare Other | Admitting: General Surgery

## 2013-11-18 DIAGNOSIS — C50912 Malignant neoplasm of unspecified site of left female breast: Secondary | ICD-10-CM | POA: Diagnosis not present

## 2013-11-18 LAB — PATHOLOGY

## 2013-11-18 NOTE — Progress Notes (Signed)
Patient ID: Helen Patton, female   DOB: 1960-09-04, 52 y.o.   MRN: [id number]  Chief Complaint  Patient presents with  . Other    discussion    HPI Helen Patton is a 53 y.o. female.  The patient's mom(Molley), caregiver(Becky) and Aunt are here today for discussion.   HPI  Past Medical History  Diagnosis Date  . Mental retardation   . GERD (gastroesophageal reflux disease)   . Osteoporosis   . Hypertension   . Seizures   . MRSA (methicillin resistant staph aureus) culture positive 2012  . Hidradenitis 2012    MRSA, on chronic topical suppression    Past Surgical History  Procedure Laterality Date  . Broken leg    . Dislocated hip    . Cervical ablation      Family History  Problem Relation Age of Onset  . Diabetes Father   . Cancer Maternal Aunt 63    breast    Social History History  Substance Use Topics  . Smoking status: Never Smoker   . Smokeless tobacco: Never Used  . Alcohol Use: No    Allergies  Allergen Reactions  . Sulfa Antibiotics   . Keflex [Cephalexin] Other (See Comments)    unknown    Current Outpatient Prescriptions  Medication Sig Dispense Refill  . alendronate (FOSAMAX) 70 MG tablet Take 70 mg by mouth once a week. Take with a full glass of water on an empty stomach.      Marland Kitchen amLODipine (NORVASC) 10 MG tablet Take 10 mg by mouth daily.      . benzoyl peroxide 5 % external liquid Apply topically at bedtime.      . calcium-vitamin D (OSCAL WITH D) 500-200 MG-UNIT per tablet Take 1 tablet by mouth daily with breakfast.      . clindamycin (CLEOCIN T) 1 % SWAB Apply topically daily.      Marland Kitchen docusate sodium (COLACE) 100 MG capsule Take 100 mg by mouth 2 (two) times daily.      . DULoxetine (CYMBALTA) 60 MG capsule Take 60 mg by mouth daily.      Marland Kitchen lamoTRIgine (LAMICTAL) 150 MG tablet Take 150 mg by mouth 2 (two) times daily.      Marland Kitchen loratadine (CLARITIN) 10 MG tablet Take 10 mg by mouth daily.      Marland Kitchen LORazepam (ATIVAN) 1 MG tablet  Take 1 mg by mouth every 8 (eight) hours as needed for anxiety.      Marland Kitchen losartan (COZAAR) 100 MG tablet Take 100 mg by mouth daily.      Marland Kitchen omeprazole (PRILOSEC) 20 MG capsule Take 20 mg by mouth daily.      Marland Kitchen terbinafine (LAMISIL) 1 % cream Apply 1 application topically 2 (two) times a week.       No current facility-administered medications for this visit.    Review of Systems Review of Systems  There were no vitals taken for this visit.  Physical Exam Physical Exam Patient not present for visit.  Data Reviewed Biopsy showed invasive mammary carcinoma of no special tie.  Assessment    Left breast cancer.     Plan    The patient's case was reviewed with medical oncology. With her mental capacity being as limited as it is, I don't think that she would tolerate radiation therapy for breast conservation. The patient's mother, aunt and group home caregiver had a chance to review the procedure for radiation therapy with a nurse from the radiation  center.  The potential to complete wide excision alone, and make use of an antiestrogen if indicated while not standard of care, may be most appropriate for this patient.    Patient's family to decide on surgery and will call the office back when they would like to proceed.   Once a surgery date is arranged, a phone interview can be completed by contacting [name] with the group home at [phone].     PCP: Philemon Kingdom 11/21/2013, 8:54 PM

## 2013-11-21 DIAGNOSIS — C50919 Malignant neoplasm of unspecified site of unspecified female breast: Secondary | ICD-10-CM | POA: Insufficient documentation

## 2013-11-24 ENCOUNTER — Telehealth: Payer: Self-pay | Admitting: *Deleted

## 2013-11-24 ENCOUNTER — Other Ambulatory Visit: Payer: Self-pay | Admitting: General Surgery

## 2013-11-24 DIAGNOSIS — C50912 Malignant neoplasm of unspecified site of left female breast: Secondary | ICD-10-CM

## 2013-11-24 NOTE — Telephone Encounter (Signed)
No answer at home number.  We need to get a date for patient's surgery.

## 2013-11-24 NOTE — Telephone Encounter (Signed)
Message left for Helen Patton in Pre-admit with this information.   This was also handwritten on surgical orders which will be hand delivered to [facility] tomorrow morning.

## 2013-11-24 NOTE — Telephone Encounter (Signed)
Message copied by Dominga Ferry on Wed Nov 24, 2013  4:18 PM ------      Message from: Goehner, Dorrington W      Created: Wed Nov 24, 2013  3:18 PM       Please notify day surgery that the patient does require IV vancomycin the morning of surgery. Thank you ------

## 2013-11-24 NOTE — Telephone Encounter (Signed)
Patient's surgery has been scheduled for 12-07-13 at [facility].  Paperwork has been mailed to patient's mom and [name] at [employer].   This patient's mom was instructed to call the office should she have further questions.

## 2013-12-06 ENCOUNTER — Encounter: Payer: Self-pay | Admitting: General Surgery

## 2013-12-06 ENCOUNTER — Ambulatory Visit (INDEPENDENT_AMBULATORY_CARE_PROVIDER_SITE_OTHER): Payer: Medicare Other | Admitting: General Surgery

## 2013-12-06 VITALS — BP 122/78 | HR 66 | Resp 14 | Ht <= 58 in | Wt 150.0 lb

## 2013-12-06 DIAGNOSIS — F79 Unspecified intellectual disabilities: Secondary | ICD-10-CM

## 2013-12-06 DIAGNOSIS — C50912 Malignant neoplasm of unspecified site of left female breast: Secondary | ICD-10-CM | POA: Diagnosis not present

## 2013-12-06 DIAGNOSIS — Z01818 Encounter for other preprocedural examination: Secondary | ICD-10-CM | POA: Diagnosis not present

## 2013-12-06 NOTE — Progress Notes (Signed)
Patient ID: Helen Patton, female   DOB: 1960-11-19, 53 y.o.   MRN: [id number]  Chief Complaint  Patient presents with  . Other    fatty area between breasts    HPI Helen Patton is a 53 y.o. female who presents for an evaluation of a fatty area between her breasts. The area was noticed approximately 2 weeks ago, shortly after the biopsy of the left breast confirming malignancy.. No pain in this area. The area seems to have reduced in size since initial discovery. No history of trauma to the area.  HPI  Past Medical History  Diagnosis Date  . Mental retardation   . GERD (gastroesophageal reflux disease)   . Osteoporosis   . Hypertension   . Seizures   . MRSA (methicillin resistant staph aureus) culture positive 2012  . Hidradenitis 2012    MRSA, on chronic topical suppression    Past Surgical History  Procedure Laterality Date  . Broken leg    . Dislocated hip    . Cervical ablation      Family History  Problem Relation Age of Onset  . Diabetes Father   . Cancer Maternal Aunt 63    breast    Social History History  Substance Use Topics  . Smoking status: Never Smoker   . Smokeless tobacco: Never Used  . Alcohol Use: No    Allergies  Allergen Reactions  . Sulfa Antibiotics   . Keflex [Cephalexin] Other (See Comments)    unknown    Current Outpatient Prescriptions  Medication Sig Dispense Refill  . alendronate (FOSAMAX) 70 MG tablet Take 70 mg by mouth once a week. Take with a full glass of water on an empty stomach.      Marland Kitchen amLODipine (NORVASC) 10 MG tablet Take 10 mg by mouth daily.      . benzoyl peroxide 5 % external liquid Apply topically at bedtime.      . calcium-vitamin D (OSCAL WITH D) 500-200 MG-UNIT per tablet Take 1 tablet by mouth daily with breakfast.      . clindamycin (CLEOCIN T) 1 % SWAB Apply topically daily.      Marland Kitchen docusate sodium (COLACE) 100 MG capsule Take 100 mg by mouth 2 (two) times daily.      . DULoxetine (CYMBALTA) 60  MG capsule Take 60 mg by mouth daily.      Marland Kitchen lamoTRIgine (LAMICTAL) 150 MG tablet Take 150 mg by mouth 2 (two) times daily.      Marland Kitchen loratadine (CLARITIN) 10 MG tablet Take 10 mg by mouth daily.      Marland Kitchen LORazepam (ATIVAN) 1 MG tablet Take 1 mg by mouth every 8 (eight) hours as needed for anxiety.      Marland Kitchen losartan (COZAAR) 100 MG tablet Take 100 mg by mouth daily.      Marland Kitchen omeprazole (PRILOSEC) 20 MG capsule Take 20 mg by mouth daily.      Marland Kitchen terbinafine (LAMISIL) 1 % cream Apply 1 application topically 2 (two) times a week.       No current facility-administered medications for this visit.    Review of Systems Review of Systems  Constitutional: Negative.   Respiratory: Negative.   Cardiovascular: Negative.     Blood pressure 122/78, pulse 66, resp. rate 14, height 4\' 10"  (1.473 m), weight 150 lb (68.04 kg).  Physical Exam Physical Exam  Constitutional: She appears well-developed and well-nourished.  Cardiovascular: Normal rate, regular rhythm and normal heart sounds.   No  murmur heard. Pulmonary/Chest: Effort normal and breath sounds normal.    Skin: Skin is warm and dry.      Assessment    Soft tissue swelling, no evidence of correlation with previous breast biopsy.  Previously identified breast cancer.    Plan    The patient's case has been presented at the [facility] tumor board. In light of her limited ability to participate in care, wide local excision only is planned followed by an aromatase inhibitor. Sentinel node biopsy will not be undertaken as it would not change treatment plan. She is not able to participate in radiation therapy.  ECG obtained today at her PCPs office showed no abnormality.    PCP: Philemon Kingdom 12/06/2013, 8:53 PM

## 2013-12-07 ENCOUNTER — Ambulatory Visit: Payer: Self-pay | Admitting: General Surgery

## 2013-12-07 DIAGNOSIS — C50912 Malignant neoplasm of unspecified site of left female breast: Secondary | ICD-10-CM | POA: Diagnosis not present

## 2013-12-07 DIAGNOSIS — K219 Gastro-esophageal reflux disease without esophagitis: Secondary | ICD-10-CM | POA: Diagnosis not present

## 2013-12-07 DIAGNOSIS — F79 Unspecified intellectual disabilities: Secondary | ICD-10-CM | POA: Diagnosis not present

## 2013-12-07 DIAGNOSIS — M810 Age-related osteoporosis without current pathological fracture: Secondary | ICD-10-CM | POA: Diagnosis not present

## 2013-12-07 DIAGNOSIS — Z881 Allergy status to other antibiotic agents status: Secondary | ICD-10-CM | POA: Diagnosis not present

## 2013-12-07 DIAGNOSIS — Z833 Family history of diabetes mellitus: Secondary | ICD-10-CM | POA: Diagnosis not present

## 2013-12-07 DIAGNOSIS — Z79899 Other long term (current) drug therapy: Secondary | ICD-10-CM | POA: Diagnosis not present

## 2013-12-07 DIAGNOSIS — Z882 Allergy status to sulfonamides status: Secondary | ICD-10-CM | POA: Diagnosis not present

## 2013-12-07 DIAGNOSIS — I10 Essential (primary) hypertension: Secondary | ICD-10-CM | POA: Diagnosis not present

## 2013-12-07 DIAGNOSIS — L732 Hidradenitis suppurativa: Secondary | ICD-10-CM | POA: Diagnosis not present

## 2013-12-07 DIAGNOSIS — D0512 Intraductal carcinoma in situ of left breast: Secondary | ICD-10-CM | POA: Diagnosis not present

## 2013-12-07 DIAGNOSIS — C50512 Malignant neoplasm of lower-outer quadrant of left female breast: Secondary | ICD-10-CM | POA: Diagnosis not present

## 2013-12-07 DIAGNOSIS — Z803 Family history of malignant neoplasm of breast: Secondary | ICD-10-CM | POA: Diagnosis not present

## 2013-12-07 HISTORY — PX: BREAST SURGERY: SHX581

## 2013-12-07 HISTORY — PX: BREAST LUMPECTOMY: SHX2

## 2013-12-08 ENCOUNTER — Encounter: Payer: Self-pay | Admitting: General Surgery

## 2013-12-10 ENCOUNTER — Ambulatory Visit (INDEPENDENT_AMBULATORY_CARE_PROVIDER_SITE_OTHER): Payer: Self-pay | Admitting: *Deleted

## 2013-12-10 DIAGNOSIS — C50912 Malignant neoplasm of unspecified site of left female breast: Secondary | ICD-10-CM

## 2013-12-10 NOTE — Progress Notes (Signed)
Patient came in today for a wound check.  The wound is clean, with no signs of infection noted. .Follow up as scheduled.  

## 2013-12-10 NOTE — Patient Instructions (Signed)
The patient is aware to call back for any questions or concerns.  

## 2013-12-13 ENCOUNTER — Encounter: Payer: Self-pay | Admitting: General Surgery

## 2013-12-14 DIAGNOSIS — F39 Unspecified mood [affective] disorder: Secondary | ICD-10-CM | POA: Diagnosis not present

## 2013-12-15 ENCOUNTER — Ambulatory Visit (INDEPENDENT_AMBULATORY_CARE_PROVIDER_SITE_OTHER): Payer: Self-pay | Admitting: General Surgery

## 2013-12-15 ENCOUNTER — Encounter: Payer: Self-pay | Admitting: General Surgery

## 2013-12-15 VITALS — BP 128/76 | HR 76 | Resp 14 | Ht <= 58 in | Wt 147.0 lb

## 2013-12-15 DIAGNOSIS — C50912 Malignant neoplasm of unspecified site of left female breast: Secondary | ICD-10-CM

## 2013-12-15 NOTE — Progress Notes (Signed)
Patient ID: Helen Patton, female   DOB: Feb 16, 1960, 53 y.o.   MRN: [id number]  Chief Complaint  Patient presents with  . Routine Post Op    left breast wide excision    HPI Helen Patton is a 53 y.o. female.  Here today for postoperative visit, left breast wide excision on 12-07-13. She has been getting along well per mother and caregiver. She does not seem to complain about much pain. The patient is accompanied by the group home manager and her mother.  HPI  Past Medical History  Diagnosis Date  . Mental retardation   . GERD (gastroesophageal reflux disease)   . Osteoporosis   . Hypertension   . Seizures   . MRSA (methicillin resistant staph aureus) culture positive 2012  . Hidradenitis 2012    MRSA, on chronic topical suppression  . Cancer Oct 2015    invasive mammary carcinoma    Past Surgical History  Procedure Laterality Date  . Broken leg    . Dislocated hip    . Cervical ablation    . Breast surgery Left 12-07-13    T1b, Nx; ER+, PR+, her 2 pending. (Not candidate for chemotherapy)    Family History  Problem Relation Age of Onset  . Diabetes Father   . Cancer Maternal Aunt 63    breast    Social History History  Substance Use Topics  . Smoking status: Never Smoker   . Smokeless tobacco: Never Used  . Alcohol Use: No    Allergies  Allergen Reactions  . Sulfa Antibiotics   . Keflex [Cephalexin] Other (See Comments)    unknown    Current Outpatient Prescriptions  Medication Sig Dispense Refill  . alendronate (FOSAMAX) 70 MG tablet Take 70 mg by mouth once a week. Take with a full glass of water on an empty stomach.    Marland Kitchen amLODipine (NORVASC) 10 MG tablet Take 10 mg by mouth daily.    . benzoyl peroxide 5 % external liquid Apply topically at bedtime.    . calcium-vitamin D (OSCAL WITH D) 500-200 MG-UNIT per tablet Take 1 tablet by mouth daily with breakfast.    . clindamycin (CLEOCIN T) 1 % SWAB Apply topically daily.    Marland Kitchen docusate  sodium (COLACE) 100 MG capsule Take 100 mg by mouth 2 (two) times daily.    . DULoxetine (CYMBALTA) 60 MG capsule Take 60 mg by mouth daily.    Marland Kitchen lamoTRIgine (LAMICTAL) 150 MG tablet Take 150 mg by mouth 2 (two) times daily.    Marland Kitchen loratadine (CLARITIN) 10 MG tablet Take 10 mg by mouth daily.    Marland Kitchen LORazepam (ATIVAN) 1 MG tablet Take 1 mg by mouth every 8 (eight) hours as needed for anxiety.    Marland Kitchen losartan (COZAAR) 100 MG tablet Take 100 mg by mouth daily.    Marland Kitchen omeprazole (PRILOSEC) 20 MG capsule Take 20 mg by mouth daily.    Marland Kitchen terbinafine (LAMISIL) 1 % cream Apply 1 application topically 2 (two) times a week.     No current facility-administered medications for this visit.    Review of Systems Review of Systems  Constitutional: Negative.   Respiratory: Negative.   Cardiovascular: Negative.     Blood pressure 128/76, pulse 76, resp. rate 14, height 4' 10"  (1.473 m), weight 147 lb (66.679 kg).  Physical Exam Physical Exam  Constitutional: She appears well-developed.  Pulmonary/Chest:    Incision healing well.  Neurological: She is alert.  Skin: Skin is warm  and dry.    Data Reviewed Telephone report from pathology shows ER and PR positive, HER-2/neu 2+, reflex to [place] pending.  The patient's case is previously been presented at the [facility] tumor board. She is not a candidate for  Radiation therapy or adjuvant chemotherapy based on her mental retardation.  Assessment    Doing well status post wide excision. Stage I carcinoma the breast.     Plan    We will initiate Femara, 2.5 mg daily. We'll plan for follow-up examination in one month.       PCP:  Philemon Kingdom 12/16/2013, 8:59 AM

## 2013-12-15 NOTE — Patient Instructions (Signed)
Continue self breast exams. Call office for any new breast issues or concerns. 

## 2013-12-16 ENCOUNTER — Encounter: Payer: Self-pay | Admitting: General Surgery

## 2013-12-18 DIAGNOSIS — Z23 Encounter for immunization: Secondary | ICD-10-CM | POA: Diagnosis not present

## 2013-12-18 DIAGNOSIS — Z01818 Encounter for other preprocedural examination: Secondary | ICD-10-CM | POA: Diagnosis not present

## 2014-01-17 ENCOUNTER — Ambulatory Visit (INDEPENDENT_AMBULATORY_CARE_PROVIDER_SITE_OTHER): Payer: Self-pay | Admitting: General Surgery

## 2014-01-17 ENCOUNTER — Encounter: Payer: Self-pay | Admitting: General Surgery

## 2014-01-17 VITALS — BP 122/80 | HR 74 | Resp 14 | Ht 59.0 in | Wt 151.0 lb

## 2014-01-17 DIAGNOSIS — C50912 Malignant neoplasm of unspecified site of left female breast: Secondary | ICD-10-CM

## 2014-01-17 NOTE — Progress Notes (Signed)
Patient ID: Helen Patton, female   DOB: 1960-05-15, 53 y.o.   MRN: [id number]  Chief Complaint  Patient presents with  . Follow-up    left  breast wide excision     HPI Helen Patton is a 53 y.o. female here today for postoperative visit, left breast wide excision on 12-07-13. She has been getting along well per mother and caregiver.  HPI  Past Medical History  Diagnosis Date  . Mental retardation   . GERD (gastroesophageal reflux disease)   . Osteoporosis   . Hypertension   . Seizures   . MRSA (methicillin resistant staph aureus) culture positive 2012  . Hidradenitis 2012    MRSA, on chronic topical suppression  . Cancer Oct 2015    invasive mammary carcinoma    Past Surgical History  Procedure Laterality Date  . Broken leg    . Dislocated hip    . Cervical ablation    . Breast surgery Left 12-07-13    T1b, Nx; ER+, PR+, her 2 pending. (Not candidate for chemotherapy)    Family History  Problem Relation Age of Onset  . Diabetes Father   . Cancer Maternal Aunt 63    breast    Social History History  Substance Use Topics  . Smoking status: Never Smoker   . Smokeless tobacco: Never Used  . Alcohol Use: No    Allergies  Allergen Reactions  . Sulfa Antibiotics   . Keflex [Cephalexin] Other (See Comments)    unknown    Current Outpatient Prescriptions  Medication Sig Dispense Refill  . alendronate (FOSAMAX) 70 MG tablet Take 70 mg by mouth once a week. Take with a full glass of water on an empty stomach.    . benzoyl peroxide 5 % external liquid Apply topically at bedtime.    . calcium-vitamin D (OSCAL WITH D) 500-200 MG-UNIT per tablet Take 1 tablet by mouth daily with breakfast.    . clindamycin (CLEOCIN T) 1 % SWAB Apply topically daily.    Marland Kitchen docusate sodium (COLACE) 100 MG capsule Take 100 mg by mouth 2 (two) times daily.    . DULoxetine (CYMBALTA) 60 MG capsule Take 60 mg by mouth daily.    Marland Kitchen lamoTRIgine (LAMICTAL) 150 MG tablet Take 150  mg by mouth 2 (two) times daily.    Marland Kitchen letrozole (FEMARA) 2.5 MG tablet Take 2.5 mg by mouth daily.    Marland Kitchen loratadine (CLARITIN) 10 MG tablet Take 10 mg by mouth daily.    Marland Kitchen LORazepam (ATIVAN) 1 MG tablet Take 1 mg by mouth every 8 (eight) hours as needed for anxiety.    Marland Kitchen losartan (COZAAR) 100 MG tablet Take 100 mg by mouth daily.    Marland Kitchen omeprazole (PRILOSEC) 20 MG capsule Take 20 mg by mouth daily.    Marland Kitchen terbinafine (LAMISIL) 1 % cream Apply 1 application topically 2 (two) times a week.     No current facility-administered medications for this visit.    Review of Systems Review of Systems  Constitutional: Negative.   Respiratory: Negative.   Cardiovascular: Negative.     Blood pressure 122/80, pulse 74, resp. rate 14, height 4\' 11"  (1.499 m), weight 151 lb (68.493 kg).  Physical Exam Physical Exam  Constitutional: She is oriented to person, place, and time. She appears well-developed and well-nourished.  Pulmonary/Chest:  Left breast excision looks clean and well healed  Neurological: She is alert and oriented to person, place, and time.  Skin: Skin is warm and dry.  Assessment    Stage I carcinoma the left breast, good tolerance of aromatase inhibitor.    Plan    Patient to return in April 2016 for breast check .      PCP: Philemon Kingdom 01/18/2014, 12:11 PM

## 2014-01-17 NOTE — Patient Instructions (Signed)
Patient to return in April 2016 for breast check.

## 2014-01-24 DIAGNOSIS — R569 Unspecified convulsions: Secondary | ICD-10-CM | POA: Diagnosis not present

## 2014-01-24 DIAGNOSIS — F79 Unspecified intellectual disabilities: Secondary | ICD-10-CM | POA: Diagnosis not present

## 2014-01-31 DIAGNOSIS — R569 Unspecified convulsions: Secondary | ICD-10-CM | POA: Diagnosis not present

## 2014-05-12 DIAGNOSIS — G809 Cerebral palsy, unspecified: Secondary | ICD-10-CM | POA: Diagnosis not present

## 2014-05-12 DIAGNOSIS — F79 Unspecified intellectual disabilities: Secondary | ICD-10-CM | POA: Diagnosis not present

## 2014-05-12 DIAGNOSIS — C50919 Malignant neoplasm of unspecified site of unspecified female breast: Secondary | ICD-10-CM | POA: Diagnosis not present

## 2014-05-12 DIAGNOSIS — M25551 Pain in right hip: Secondary | ICD-10-CM | POA: Diagnosis not present

## 2014-05-19 ENCOUNTER — Ambulatory Visit (INDEPENDENT_AMBULATORY_CARE_PROVIDER_SITE_OTHER): Payer: Medicare Other | Admitting: General Surgery

## 2014-05-19 ENCOUNTER — Encounter: Payer: Self-pay | Admitting: General Surgery

## 2014-05-19 VITALS — BP 130/70 | HR 70 | Resp 14 | Ht 59.0 in | Wt 150.0 lb

## 2014-05-19 DIAGNOSIS — C50912 Malignant neoplasm of unspecified site of left female breast: Secondary | ICD-10-CM | POA: Diagnosis not present

## 2014-05-19 NOTE — Progress Notes (Signed)
Patient ID: Helen Patton, female   DOB: 1960/08/25, 54 y.o.   MRN: [id number]  Chief Complaint  Patient presents with  . Follow-up    Breast cancer    HPI Helen Patton is a 54 y.o. female here today for here follow up with breast cancer. Patient had a left breast wide excision done on 12/07/13. Last bilateral mammogram was done on 11/04/13. She saw Dr. Carmin Richmond and her heart murmur is getting louder.  HPI  Past Medical History  Diagnosis Date  . Mental retardation   . GERD (gastroesophageal reflux disease)   . Osteoporosis   . Hypertension   . Seizures   . MRSA (methicillin resistant staph aureus) culture positive 2012  . Hidradenitis 2012    MRSA, on chronic topical suppression  . Cancer Oct 2015    invasive mammary carcinoma    Past Surgical History  Procedure Laterality Date  . Broken leg    . Dislocated hip    . Cervical ablation    . Breast surgery Left 12-07-13    T1b, Nx; ER+, PR+, her 2 pending. (Not candidate for chemotherapy)    Family History  Problem Relation Age of Onset  . Diabetes Father   . Cancer Maternal Aunt 63    breast    Social History History  Substance Use Topics  . Smoking status: Never Smoker   . Smokeless tobacco: Never Used  . Alcohol Use: No    Allergies  Allergen Reactions  . Sulfa Antibiotics   . Keflex [Cephalexin] Other (See Comments)    unknown    Current Outpatient Prescriptions  Medication Sig Dispense Refill  . alendronate (FOSAMAX) 70 MG tablet Take 70 mg by mouth once a week. Take with a full glass of water on an empty stomach.    . benzoyl peroxide 5 % external liquid Apply topically at bedtime.    . calcium-vitamin D (OSCAL WITH D) 500-200 MG-UNIT per tablet Take 1 tablet by mouth daily with breakfast.    . clindamycin (CLEOCIN T) 1 % SWAB Apply topically daily.    Marland Kitchen docusate sodium (COLACE) 100 MG capsule Take 100 mg by mouth 2 (two) times daily.    . DULoxetine (CYMBALTA) 60 MG capsule Take 60 mg by  mouth daily.    Marland Kitchen lamoTRIgine (LAMICTAL) 150 MG tablet Take 150 mg by mouth 2 (two) times daily.    Marland Kitchen letrozole (FEMARA) 2.5 MG tablet Take 2.5 mg by mouth daily.    Marland Kitchen loratadine (CLARITIN) 10 MG tablet Take 10 mg by mouth daily.    Marland Kitchen LORazepam (ATIVAN) 1 MG tablet Take 1 mg by mouth every 8 (eight) hours as needed for anxiety.    Marland Kitchen losartan (COZAAR) 100 MG tablet Take 100 mg by mouth daily.    Marland Kitchen omeprazole (PRILOSEC) 20 MG capsule Take 20 mg by mouth daily.    Marland Kitchen terbinafine (LAMISIL) 1 % cream Apply 1 application topically 2 (two) times a week.     No current facility-administered medications for this visit.    Review of Systems Review of Systems  Constitutional: Negative.   Respiratory: Negative.   Cardiovascular: Negative.     Blood pressure 130/70, pulse 70, resp. rate 14, height 4\' 11"  (1.499 m), weight 150 lb (68.04 kg).  Physical Exam Physical Exam  Constitutional: She is oriented to person, place, and time. She appears well-developed and well-nourished.  Eyes: Conjunctivae are normal. No scleral icterus.  Neck: Neck supple.  Cardiovascular: Normal rate and regular  rhythm.   Murmur heard.  Systolic murmur is present with a grade of 2/6  Pulmonary/Chest: Effort normal and breath sounds normal. Right breast exhibits no inverted nipple, no mass, no nipple discharge, no skin change and no tenderness. Left breast exhibits no inverted nipple, no mass, no nipple discharge, no skin change and no tenderness.  Left breat incision at 6 o'clock well healed.  Lymphadenopathy:    She has no cervical adenopathy.    She has no axillary adenopathy.  Neurological: She is alert and oriented to person, place, and time.  Skin: Skin is warm and dry.     Assessment    Doing well status post left breast wide excision.    Plan       Patient will be asked to return to the office in six months  with a bilateral diagnostic mammogram.  PCP:  Philemon Kingdom 05/21/2014, 12:25 PM

## 2014-05-19 NOTE — Patient Instructions (Addendum)
Patient will be asked to return to the office in six months  with a bilateral screening mammogram.

## 2014-05-26 ENCOUNTER — Ambulatory Visit
Admit: 2014-05-26 | Disposition: A | Discharge status: Home / Self Care | Payer: Self-pay | Attending: Family Medicine | Admitting: Family Medicine

## 2014-05-26 DIAGNOSIS — R011 Cardiac murmur, unspecified: Secondary | ICD-10-CM | POA: Diagnosis not present

## 2014-05-26 DIAGNOSIS — I071 Rheumatic tricuspid insufficiency: Secondary | ICD-10-CM | POA: Diagnosis not present

## 2014-05-26 DIAGNOSIS — I340 Nonrheumatic mitral (valve) insufficiency: Secondary | ICD-10-CM | POA: Diagnosis not present

## 2014-05-27 DIAGNOSIS — M25551 Pain in right hip: Secondary | ICD-10-CM | POA: Diagnosis not present

## 2014-05-27 DIAGNOSIS — R569 Unspecified convulsions: Secondary | ICD-10-CM | POA: Diagnosis not present

## 2014-06-01 DIAGNOSIS — G40901 Epilepsy, unspecified, not intractable, with status epilepticus: Secondary | ICD-10-CM | POA: Diagnosis not present

## 2014-06-01 DIAGNOSIS — F73 Profound intellectual disabilities: Secondary | ICD-10-CM | POA: Diagnosis not present

## 2014-06-01 DIAGNOSIS — M6281 Muscle weakness (generalized): Secondary | ICD-10-CM | POA: Diagnosis not present

## 2014-06-04 NOTE — Op Note (Signed)
PATIENT NAME:  Helen Patton, Helen Patton MR#:  [id number] DATE OF BIRTH:  May 27, 1960  DATE OF PROCEDURE:  12/07/2013  PREOPERATIVE DIAGNOSIS: Left breast cancer.   POSTOPERATIVE DIAGNOSIS: Left breast cancer.   OPERATIVE PROCEDURE: Left breast wide excision.   SURGEON: Hervey Ard, MD   ANESTHESIA: General by LMA under Dr. Myra Gianotti. Marcaine 0.5% with 1:200,000 units epinephrine, 30 cc local infiltration.   ESTIMATED BLOOD LOSS: Minimal.   CLINICAL NOTE: This 54 year old woman with profound mental retardation was identified with an abnormal mammogram and core biopsy showed evidence of invasive mammary carcinoma. Considering her limited ability to participate in care and in close consultation with both her mother and the group home manager where she has lived for the last 20 years, and after presenting her case at the [facility] tumor board it was elected to proceed to wide excision alone.   OPERATIVE NOTE: With the patient under adequate general anesthesia, the breast was prepped with ChloraPrep and draped. Ultrasound was used to identify the previous biopsy site and marker. A radial incision at the five-thirty o'clock position was made from the edge of the areola to just above the inframammary fold. The skin was incised sharply and the remaining dissection completed with electrocautery. The adipose layer was elevated off the breast parenchyma and a 5 x 5 x 5 cm block of tissue excised, orientated and stents were sent for specimen radiograph. The clip was identified in the gross examination showed at least a 5 mm margin for the biopsy site from the edge of the resection. Hemostasis was excellent. The wound was closed in multiple layers with 2-0 Vicryl figure-of-eight sutures. The breast parenchyma was elevated off the underlying pectoralis fascias to allow better approximation. Skin was closed with running 4-0 Vicryl subcuticular suture. Marcaine was infiltrated for postoperative analgesia. Benzoin and  Steri-Strips, followed by Telfa pad, fluff gauze, Kerlix and an Ace wrap were then applied.   The patient tolerated the procedure well and was taken to the recovery room in stable condition.     ____________________________ Robert Bellow, MD jwb:JT D: 12/07/2013 10:04:19 ET T: 12/07/2013 22:52:36 ET JOB#: [id number]  cc: Robert Bellow, MD, <Dictator> Richard L. Rosanna Randy, MD  JEFFREY Amedeo Kinsman MD ELECTRONICALLY SIGNED 12/08/2013 14:53

## 2014-06-06 LAB — SURGICAL PATHOLOGY

## 2014-06-08 DIAGNOSIS — G40901 Epilepsy, unspecified, not intractable, with status epilepticus: Secondary | ICD-10-CM | POA: Diagnosis not present

## 2014-06-08 DIAGNOSIS — F73 Profound intellectual disabilities: Secondary | ICD-10-CM | POA: Diagnosis not present

## 2014-06-08 DIAGNOSIS — M6281 Muscle weakness (generalized): Secondary | ICD-10-CM | POA: Diagnosis not present

## 2014-06-14 DIAGNOSIS — F39 Unspecified mood [affective] disorder: Secondary | ICD-10-CM | POA: Diagnosis not present

## 2014-06-15 DIAGNOSIS — M6281 Muscle weakness (generalized): Secondary | ICD-10-CM | POA: Diagnosis not present

## 2014-06-15 DIAGNOSIS — G40901 Epilepsy, unspecified, not intractable, with status epilepticus: Secondary | ICD-10-CM | POA: Diagnosis not present

## 2014-06-15 DIAGNOSIS — F73 Profound intellectual disabilities: Secondary | ICD-10-CM | POA: Diagnosis not present

## 2014-06-22 DIAGNOSIS — M25551 Pain in right hip: Secondary | ICD-10-CM | POA: Diagnosis not present

## 2014-06-22 DIAGNOSIS — Z0000 Encounter for general adult medical examination without abnormal findings: Secondary | ICD-10-CM | POA: Diagnosis not present

## 2014-06-22 DIAGNOSIS — G809 Cerebral palsy, unspecified: Secondary | ICD-10-CM | POA: Diagnosis not present

## 2014-06-22 DIAGNOSIS — R399 Unspecified symptoms and signs involving the genitourinary system: Secondary | ICD-10-CM | POA: Diagnosis not present

## 2014-06-22 DIAGNOSIS — F79 Unspecified intellectual disabilities: Secondary | ICD-10-CM | POA: Diagnosis not present

## 2014-06-24 DIAGNOSIS — F73 Profound intellectual disabilities: Secondary | ICD-10-CM | POA: Diagnosis not present

## 2014-06-24 DIAGNOSIS — M6281 Muscle weakness (generalized): Secondary | ICD-10-CM | POA: Diagnosis not present

## 2014-06-24 DIAGNOSIS — G40901 Epilepsy, unspecified, not intractable, with status epilepticus: Secondary | ICD-10-CM | POA: Diagnosis not present

## 2014-06-29 DIAGNOSIS — F73 Profound intellectual disabilities: Secondary | ICD-10-CM | POA: Diagnosis not present

## 2014-06-29 DIAGNOSIS — M6281 Muscle weakness (generalized): Secondary | ICD-10-CM | POA: Diagnosis not present

## 2014-06-29 DIAGNOSIS — G40901 Epilepsy, unspecified, not intractable, with status epilepticus: Secondary | ICD-10-CM | POA: Diagnosis not present

## 2014-07-06 DIAGNOSIS — F73 Profound intellectual disabilities: Secondary | ICD-10-CM | POA: Diagnosis not present

## 2014-07-06 DIAGNOSIS — M6281 Muscle weakness (generalized): Secondary | ICD-10-CM | POA: Diagnosis not present

## 2014-07-06 DIAGNOSIS — G40901 Epilepsy, unspecified, not intractable, with status epilepticus: Secondary | ICD-10-CM | POA: Diagnosis not present

## 2014-07-13 DIAGNOSIS — M6281 Muscle weakness (generalized): Secondary | ICD-10-CM | POA: Diagnosis not present

## 2014-07-13 DIAGNOSIS — G40901 Epilepsy, unspecified, not intractable, with status epilepticus: Secondary | ICD-10-CM | POA: Diagnosis not present

## 2014-07-13 DIAGNOSIS — F73 Profound intellectual disabilities: Secondary | ICD-10-CM | POA: Diagnosis not present

## 2014-07-26 ENCOUNTER — Other Ambulatory Visit: Payer: Self-pay | Admitting: Family Medicine

## 2014-08-02 ENCOUNTER — Other Ambulatory Visit: Payer: Self-pay | Admitting: Family Medicine

## 2014-08-03 ENCOUNTER — Other Ambulatory Visit: Payer: Self-pay

## 2014-08-03 MED ORDER — CALCIUM CARBONATE-VITAMIN D 500-200 MG-UNIT PO TABS: 1.0000 | ORAL_TABLET | Freq: Every day | ORAL | Status: DC

## 2014-08-23 ENCOUNTER — Emergency Department: Payer: Medicare Other

## 2014-08-23 ENCOUNTER — Encounter: Payer: Self-pay | Admitting: Emergency Medicine

## 2014-08-23 ENCOUNTER — Emergency Department
Admission: EM | Admit: 2014-08-23 | Discharge: 2014-08-23 | Disposition: A | Discharge status: Home / Self Care | Payer: Medicare Other | Attending: Emergency Medicine | Admitting: Emergency Medicine

## 2014-08-23 DIAGNOSIS — Z792 Long term (current) use of antibiotics: Secondary | ICD-10-CM | POA: Insufficient documentation

## 2014-08-23 DIAGNOSIS — Z79899 Other long term (current) drug therapy: Secondary | ICD-10-CM | POA: Diagnosis not present

## 2014-08-23 DIAGNOSIS — R569 Unspecified convulsions: Secondary | ICD-10-CM | POA: Diagnosis not present

## 2014-08-23 DIAGNOSIS — I10 Essential (primary) hypertension: Secondary | ICD-10-CM | POA: Insufficient documentation

## 2014-08-23 DIAGNOSIS — G40909 Epilepsy, unspecified, not intractable, without status epilepticus: Secondary | ICD-10-CM | POA: Diagnosis not present

## 2014-08-23 LAB — COMPREHENSIVE METABOLIC PANEL
ALT: 20 U/L (ref 14–54)
AST: 33 U/L (ref 15–41)
Albumin: 4.4 g/dL (ref 3.5–5.0)
Alkaline Phosphatase: 95 U/L (ref 38–126)
Anion gap: 10 (ref 5–15)
BILIRUBIN TOTAL: 0.9 mg/dL (ref 0.3–1.2)
BUN: 19 mg/dL (ref 6–20)
CALCIUM: 9 mg/dL (ref 8.9–10.3)
CHLORIDE: 93 mmol/L — AB (ref 101–111)
CO2: 28 mmol/L (ref 22–32)
Creatinine, Ser: 0.87 mg/dL (ref 0.44–1.00)
GFR calc Af Amer: >60 mL/min (ref >60)
Glucose, Bld: 136 mg/dL — ABNORMAL HIGH (ref 65–99)
Potassium: 5.2 mmol/L — ABNORMAL HIGH (ref 3.5–5.1)
SODIUM: 131 mmol/L — AB (ref 135–145)
Total Protein: 7.5 g/dL (ref 6.5–8.1)

## 2014-08-23 LAB — URINALYSIS COMPLETE WITH MICROSCOPIC (ARMC ONLY)
BACTERIA UA: NONE SEEN
BILIRUBIN URINE: NEGATIVE
GLUCOSE, UA: NEGATIVE mg/dL
KETONES UR: NEGATIVE mg/dL
Leukocytes, UA: NEGATIVE
NITRITE: NEGATIVE
PROTEIN: NEGATIVE mg/dL
Specific Gravity, Urine: 1.005 (ref 1.005–1.030)
Squamous Epithelial / LPF: NONE SEEN
pH: 7 (ref 5.0–8.0)

## 2014-08-23 LAB — CBC WITH DIFFERENTIAL/PLATELET
BASOS PCT: 1 %
Basophils Absolute: 0.1 10*3/uL (ref 0–0.1)
EOS ABS: 0.1 10*3/uL (ref 0–0.7)
Eosinophils Relative: 1 %
HCT: 38.6 % (ref 35.0–47.0)
Hemoglobin: 12.7 g/dL (ref 12.0–16.0)
Lymphocytes Relative: 16 %
Lymphs Abs: 1.8 10*3/uL (ref 1.0–3.6)
MCH: 29.9 pg (ref 26.0–34.0)
MCHC: 32.9 g/dL (ref 32.0–36.0)
MCV: 90.9 fL (ref 80.0–100.0)
Monocytes Absolute: 1.3 10*3/uL — ABNORMAL HIGH (ref 0.2–0.9)
Monocytes Relative: 11 %
NEUTROS PCT: 71 %
Neutro Abs: 8.3 10*3/uL — ABNORMAL HIGH (ref 1.4–6.5)
Platelets: 322 10*3/uL (ref 150–440)
RBC: 4.24 MIL/uL (ref 3.80–5.20)
RDW: 14.9 % — AB (ref 11.5–14.5)
WBC: 11.5 10*3/uL — ABNORMAL HIGH (ref 3.6–11.0)

## 2014-08-23 LAB — LIPASE, BLOOD: Lipase: 41 U/L (ref 22–51)

## 2014-08-23 LAB — GLUCOSE, CAPILLARY: Glucose-Capillary: 130 mg/dL — ABNORMAL HIGH (ref 65–99)

## 2014-08-23 MED ORDER — SODIUM CHLORIDE 0.9 % IV BOLUS (SEPSIS)
1000.0000 mL | Freq: Once | INTRAVENOUS | Status: AC
  Administered 2014-08-23: 1000 mL via INTRAVENOUS

## 2014-08-23 NOTE — ED Notes (Signed)
Pt here with caregiver, reports pt has had 2 seizures in 2 hours. Caregiver reports seizures last around 5 minutes; reports giving 1mg  ativan at 1413. Caregiver denies pt fell or hit head. Caregiver reports pt is back to normal, pt sees Dr Manuella Ghazi for neurology.

## 2014-08-23 NOTE — ED Provider Notes (Signed)
[facility] Emergency Department Provider Note  ____________________________________________  Time seen: Approximately 4 PM  I have reviewed the triage vital signs and the nursing notes.   HISTORY  Chief Complaint Seizures  History given by care provider who witnessed the seizures. Patient is nonverbal at her baseline  HPI Helen Patton is a 54 y.o. female with a history of mental retardation and seizures who presents today with seizure 2 from her group home. Per the caregiver the patient had 2, 5 minute seizure episodes. She was staring into space with lip smacking which is her typical seizure pattern. The last time she was having seizures was several months ago. She has been compliant with her medications and has not been acting sick lately. She is followed by Dr. Manuella Ghazi of neurology.Per the family at this time the patient is acting a little bit tired but otherwise is at her baseline.   Past Medical History  Diagnosis Date  . Mental retardation   . GERD (gastroesophageal reflux disease)   . Osteoporosis   . Hypertension   . Seizures   . MRSA (methicillin resistant staph aureus) culture positive 2012  . Hidradenitis 2012    MRSA, on chronic topical suppression  . Cancer Oct 2015    invasive mammary carcinoma    Patient Active Problem List   Diagnosis Date Noted  . Breast cancer 11/21/2013  . Mental retardation 11/16/2013    Past Surgical History  Procedure Laterality Date  . Broken leg    . Dislocated hip    . Cervical ablation    . Breast surgery Left 12-07-13    T1b, Nx; ER+, PR+, her 2 pending. (Not candidate for chemotherapy)    Current Outpatient Rx  Name  Route  Sig  Dispense  Refill  . alendronate (FOSAMAX) 70 MG tablet      1 TAB PO WEEKLY EARLY AM BEFORE FOOD/MEDS W/WATER. DON'T LIE DOWN FOR30 MIN (OSTEOPOROSIS) *NO CRUSH*   5 tablet   12   . benzoyl peroxide 5 % external liquid   Topical   Apply topically at bedtime.         . calcium-vitamin D (OSCAL WITH D) 500-200 MG-UNIT per tablet   Oral   Take 1 tablet by mouth daily with breakfast.   30 tablet   12   . clindamycin (CLEOCIN T) 1 % SWAB   Topical   Apply topically daily.         Marland Kitchen docusate sodium (COLACE) 100 MG capsule   Oral   Take 100 mg by mouth 2 (two) times daily.         . DULoxetine (CYMBALTA) 60 MG capsule   Oral   Take 60 mg by mouth daily.         Marland Kitchen lamoTRIgine (LAMICTAL) 150 MG tablet   Oral   Take 150 mg by mouth 2 (two) times daily.         Marland Kitchen letrozole (FEMARA) 2.5 MG tablet   Oral   Take 2.5 mg by mouth daily.         Marland Kitchen loratadine (CLARITIN) 10 MG tablet   Oral   Take 10 mg by mouth daily.         Marland Kitchen LORazepam (ATIVAN) 1 MG tablet   Oral   Take 1 mg by mouth every 8 (eight) hours as needed for anxiety.         Marland Kitchen losartan (COZAAR) 100 MG tablet   Oral   Take  100 mg by mouth daily.         . meloxicam (MOBIC) 7.5 MG tablet      TAKE ONE TABLET BY MOUTH 2 TIMES A DAY WITH MEALS   60 tablet   11   . omeprazole (PRILOSEC) 20 MG capsule   Oral   Take 20 mg by mouth daily.         Marland Kitchen terbinafine (LAMISIL) 1 % cream   Topical   Apply 1 application topically 2 (two) times a week.           Allergies Sulfa antibiotics and Keflex  Family History  Problem Relation Age of Onset  . Diabetes Father   . Cancer Maternal Aunt 63    breast    Social History History  Substance Use Topics  . Smoking status: Never Smoker   . Smokeless tobacco: Never Used  . Alcohol Use: No    Review of Systems Constitutional: No fever/chills Eyes: No visual changes. ENT: No sore throat. Cardiovascular: Denies chest pain. Respiratory: Denies shortness of breath. Gastrointestinal: No abdominal pain.  No nausea, no vomiting.  No diarrhea.  No constipation. Genitourinary: Negative for dysuria. Musculoskeletal: Negative for back pain. Skin: Negative for rash. Neurological: Negative for headaches, focal  weakness or numbness.  10-point ROS otherwise negative.  ____________________________________________   PHYSICAL EXAM:  VITAL SIGNS: ED Triage Vitals  Enc Vitals Group     BP 08/23/14 1513 139/72 mmHg     Pulse Rate 08/23/14 1513 109     Resp 08/23/14 1513 16     Temp 08/23/14 1513 98.3 F (36.8 C)     Temp Source 08/23/14 1513 Oral     SpO2 08/23/14 1513 96 %     Weight --      Height --      Head Cir --      Peak Flow --      Pain Score --      Pain Loc --      Pain Edu? --      Excl. in [place]? --     Constitutional: Alert. Well appearing and in no acute distress. Eyes: Conjunctivae are normal. PERRL. EOMI. Head: Atraumatic. Nose: No congestion/rhinnorhea. Mouth/Throat: Mucous membranes are moist.  Oropharynx non-erythematous. Neck: No stridor.   Cardiovascular: Normal rate, regular rhythm. Grossly normal heart sounds.  Good peripheral circulation. Respiratory: Normal respiratory effort.  No retractions. Lungs CTAB. Gastrointestinal: Soft with mild tenderness diffusely. There is no rigidity rebound or guarding. No distention. No abdominal bruits. No CVA tenderness. Musculoskeletal: No lower extremity tenderness nor edema.  No joint effusions. Neurologic:  Patient is nonverbal. No gross focal neurologic deficits are appreciated. Skin:  Skin is warm, dry and intact. No rash noted. Psychiatric: Mood and affect are normal. Speech and behavior are normal.  ____________________________________________   LABS (all labs ordered are listed, but only abnormal results are displayed)  Labs Reviewed  GLUCOSE, CAPILLARY - Abnormal; Notable for the following:    Glucose-Capillary 130 (*)    All other components within normal limits  URINALYSIS COMPLETEWITH MICROSCOPIC ([facility] ONLY) - Abnormal; Notable for the following:    Color, Urine STRAW (*)    APPearance CLEAR (*)    Hgb urine dipstick 2+ (*)    All other components within normal limits  CBC WITH DIFFERENTIAL/PLATELET -  Abnormal; Notable for the following:    WBC 11.5 (*)    RDW 14.9 (*)    Neutro Abs 8.3 (*)  Monocytes Absolute 1.3 (*)    All other components within normal limits  COMPREHENSIVE METABOLIC PANEL - Abnormal; Notable for the following:    Sodium 131 (*)    Potassium 5.2 (*)    Chloride 93 (*)    Glucose, Bld 136 (*)    All other components within normal limits  LIPASE, BLOOD  CBG MONITORING, ED   ____________________________________________  EKG   ____________________________________________  RADIOLOGY  No active cardiopulmonary disease. I personally reviewed these images. ____________________________________________   PROCEDURES    ____________________________________________   INITIAL IMPRESSION / ASSESSMENT AND PLAN / ED COURSE  Pertinent labs & imaging results that were available during my care of the patient were reviewed by me and considered in my medical decision making (see chart for details).  ----------------------------------------- 8:45 PM on 08/23/2014 -----------------------------------------  Patient resting comfortably at this time. No further seizures in the emergency department. Heart rate is down in the 90s while at rest. Caregiver and mother says that the patient is now back to her baseline mental status. I discussed the case with Dr. Irish Elders of the neurology service who does not want to make any changes to the medications at this time. Since the seizures only happening once every several months and the sodium is also borderline low it does not want to increase the patient's Lamictal for fear that it will further drop the sodium. Discussed this with the family and they will follow up with Dr. Brigitte Pulse for further recommendations. Do not see any evidence of an outside source causing the seizures as the patient did not have any outward signs of infection. There is a slightly elevated white blood cell count, but this has been seen in the past and this may be  more as a result of the seizures that of alternate pathology. I discussed thoroughly the patient's abdominal tenderness and the family says that the patient has this chronically and has been worked up for in the past without finding a pathologic source. They stated the exam was the patient's baseline. ____________________________________________   FINAL CLINICAL IMPRESSION(S) / ED DIAGNOSES  Acute seizure.     Orbie Pyo, MD 08/23/14 [phone]

## 2014-08-23 NOTE — Discharge Instructions (Signed)

## 2014-09-12 DIAGNOSIS — R569 Unspecified convulsions: Secondary | ICD-10-CM | POA: Diagnosis not present

## 2014-09-12 DIAGNOSIS — F79 Unspecified intellectual disabilities: Secondary | ICD-10-CM | POA: Diagnosis not present

## 2014-09-13 DIAGNOSIS — F39 Unspecified mood [affective] disorder: Secondary | ICD-10-CM | POA: Diagnosis not present

## 2014-09-20 ENCOUNTER — Other Ambulatory Visit: Payer: Self-pay

## 2014-09-20 DIAGNOSIS — C50912 Malignant neoplasm of unspecified site of left female breast: Secondary | ICD-10-CM

## 2014-09-26 ENCOUNTER — Other Ambulatory Visit: Payer: Self-pay

## 2014-09-26 ENCOUNTER — Emergency Department: Payer: Medicare Other

## 2014-09-26 ENCOUNTER — Emergency Department
Admission: EM | Admit: 2014-09-26 | Discharge: 2014-09-26 | Disposition: A | Discharge status: Home / Self Care | Payer: Medicare Other | Attending: Emergency Medicine | Admitting: Emergency Medicine

## 2014-09-26 ENCOUNTER — Encounter: Payer: Self-pay | Admitting: Emergency Medicine

## 2014-09-26 DIAGNOSIS — Z79899 Other long term (current) drug therapy: Secondary | ICD-10-CM | POA: Diagnosis not present

## 2014-09-26 DIAGNOSIS — I10 Essential (primary) hypertension: Secondary | ICD-10-CM | POA: Insufficient documentation

## 2014-09-26 DIAGNOSIS — Z79811 Long term (current) use of aromatase inhibitors: Secondary | ICD-10-CM | POA: Diagnosis not present

## 2014-09-26 DIAGNOSIS — S7011XA Contusion of right thigh, initial encounter: Secondary | ICD-10-CM | POA: Diagnosis not present

## 2014-09-26 DIAGNOSIS — Z791 Long term (current) use of non-steroidal anti-inflammatories (NSAID): Secondary | ICD-10-CM | POA: Insufficient documentation

## 2014-09-26 DIAGNOSIS — R2689 Other abnormalities of gait and mobility: Secondary | ICD-10-CM | POA: Insufficient documentation

## 2014-09-26 DIAGNOSIS — S0990XA Unspecified injury of head, initial encounter: Secondary | ICD-10-CM | POA: Diagnosis not present

## 2014-09-26 DIAGNOSIS — R269 Unspecified abnormalities of gait and mobility: Secondary | ICD-10-CM | POA: Diagnosis not present

## 2014-09-26 DIAGNOSIS — K08401 Partial loss of teeth, unspecified cause, class I: Secondary | ICD-10-CM | POA: Insufficient documentation

## 2014-09-26 DIAGNOSIS — W1811XA Fall from or off toilet without subsequent striking against object, initial encounter: Secondary | ICD-10-CM | POA: Diagnosis not present

## 2014-09-26 LAB — COMPREHENSIVE METABOLIC PANEL
ALT: 16 U/L (ref 14–54)
ANION GAP: 9 (ref 5–15)
AST: 24 U/L (ref 15–41)
Albumin: 4.5 g/dL (ref 3.5–5.0)
Alkaline Phosphatase: 79 U/L (ref 38–126)
BUN: 24 mg/dL — ABNORMAL HIGH (ref 6–20)
CALCIUM: 9.4 mg/dL (ref 8.9–10.3)
CHLORIDE: 98 mmol/L — AB (ref 101–111)
CO2: 32 mmol/L (ref 22–32)
Creatinine, Ser: 0.87 mg/dL (ref 0.44–1.00)
GFR calc non Af Amer: >60 mL/min (ref >60)
Glucose, Bld: 118 mg/dL — ABNORMAL HIGH (ref 65–99)
Potassium: 4.3 mmol/L (ref 3.5–5.1)
SODIUM: 139 mmol/L (ref 135–145)
Total Bilirubin: 0.6 mg/dL (ref 0.3–1.2)
Total Protein: 7.9 g/dL (ref 6.5–8.1)

## 2014-09-26 LAB — URINALYSIS COMPLETE WITH MICROSCOPIC (ARMC ONLY)
BILIRUBIN URINE: NEGATIVE
Bacteria, UA: NONE SEEN
Glucose, UA: NEGATIVE mg/dL
HGB URINE DIPSTICK: NEGATIVE
LEUKOCYTES UA: NEGATIVE
NITRITE: NEGATIVE
Protein, ur: NEGATIVE mg/dL
SPECIFIC GRAVITY, URINE: 1.018 (ref 1.005–1.030)
pH: 6 (ref 5.0–8.0)

## 2014-09-26 LAB — CBC WITH DIFFERENTIAL/PLATELET
BASOS ABS: 0.2 10*3/uL — AB (ref 0–0.1)
Basophils Relative: 2 %
EOS PCT: 1 %
Eosinophils Absolute: 0.1 10*3/uL (ref 0–0.7)
HCT: 37.5 % (ref 35.0–47.0)
Hemoglobin: 12.2 g/dL (ref 12.0–16.0)
LYMPHS PCT: 12 %
Lymphs Abs: 1.2 10*3/uL (ref 1.0–3.6)
MCH: 29.3 pg (ref 26.0–34.0)
MCHC: 32.4 g/dL (ref 32.0–36.0)
MCV: 90.4 fL (ref 80.0–100.0)
Monocytes Absolute: 1.1 10*3/uL — ABNORMAL HIGH (ref 0.2–0.9)
Monocytes Relative: 11 %
NEUTROS ABS: 7.4 10*3/uL — AB (ref 1.4–6.5)
Neutrophils Relative %: 74 %
PLATELETS: 339 10*3/uL (ref 150–440)
RBC: 4.15 MIL/uL (ref 3.80–5.20)
RDW: 14.4 % (ref 11.5–14.5)
WBC: 9.9 10*3/uL (ref 3.6–11.0)

## 2014-09-26 LAB — TROPONIN I

## 2014-09-26 LAB — LIPASE, BLOOD: Lipase: 20 U/L — ABNORMAL LOW (ref 22–51)

## 2014-09-26 NOTE — ED Notes (Signed)
Observed patient walk to the toilet and caregiver stated that it was her base line

## 2014-09-26 NOTE — ED Notes (Addendum)
Caregiver reports pt with unsteady gait that started today, reports pt had change in medication ( Lamotrigine from 200mg  daily to 300mg  twice daily.) Pt denies any urinary symptoms.  Pt is nonverbal, able to answer yes and no questions only.

## 2014-09-26 NOTE — ED Provider Notes (Signed)
[facility] Emergency Department Provider Note ____________________________________________  Time seen: Approximately 3:19 PM  I have reviewed the triage vital signs and the nursing notes.   HISTORY  Chief Complaint Gait Problem  History Limited as patient is minimally verbal at baseline and can only say yes or no. History is provided by patient's caregiver from the [employer] home where she lives, Lum Keas who states she has lived there for 2 years.  HPI Helen Patton is a 54 y.o. female history of mental retardation, seizures, breast cancer who was in her usual state of health until she fell in the bathroom yesterday however no injury was noted and patient seemed to be walking normally. 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. While there she went to the bathroom and then held onto the rail as if she couldn't get her balance and would not walk. At baseline she walks without assistance.  Has not had any recent illness, fever, vomiting, diarrhea, cough. This morning is the first time caregivers noted any difficulty while at the vocational facility. Uncertain the time that this began   Past Medical History  Diagnosis Date  . Mental retardation   . GERD (gastroesophageal reflux disease)   . Osteoporosis   . Hypertension   . Seizures   . MRSA (methicillin resistant staph aureus) culture positive 2012  . Hidradenitis 2012    MRSA, on chronic topical suppression  . Cancer Oct 2015    invasive mammary carcinoma    Patient Active Problem List   Diagnosis Date Noted  . Breast cancer 11/21/2013  . Mental retardation 11/16/2013    Past Surgical History  Procedure Laterality Date  . Broken leg    . Dislocated hip    . Cervical ablation    . Breast surgery Left 12-07-13    T1b, Nx; ER+, PR+, her 2 pending. (Not candidate for chemotherapy)     Current Outpatient Rx  Name  Route  Sig  Dispense  Refill  . alendronate (FOSAMAX) 70 MG tablet      1 TAB PO WEEKLY EARLY AM BEFORE FOOD/MEDS W/WATER. DON'T LIE DOWN FOR30 MIN (OSTEOPOROSIS) *NO CRUSH*   5 tablet   12   . benzoyl peroxide 5 % external liquid   Topical   Apply topically at bedtime.         . calcium-vitamin D (OSCAL WITH D) 500-200 MG-UNIT per tablet   Oral   Take 1 tablet by mouth daily with breakfast.   30 tablet   12   . clindamycin (CLEOCIN T) 1 % SWAB   Topical   Apply topically daily.         Marland Kitchen docusate sodium (COLACE) 100 MG capsule   Oral   Take 100 mg by mouth 2 (two) times daily.         . DULoxetine (CYMBALTA) 60 MG capsule   Oral   Take 60 mg by mouth daily.         Marland Kitchen lamoTRIgine (LAMICTAL) 150 MG tablet   Oral   Take 150 mg by mouth 2 (two) times daily.         Marland Kitchen letrozole (FEMARA) 2.5 MG tablet   Oral   Take 2.5 mg by mouth daily.         Marland Kitchen loratadine (CLARITIN) 10 MG tablet   Oral   Take 10 mg  by mouth daily.         Marland Kitchen LORazepam (ATIVAN) 1 MG tablet   Oral   Take 1 mg by mouth every 8 (eight) hours as needed for anxiety.         Marland Kitchen losartan (COZAAR) 100 MG tablet   Oral   Take 100 mg by mouth daily.         . meloxicam (MOBIC) 7.5 MG tablet      TAKE ONE TABLET BY MOUTH 2 TIMES A DAY WITH MEALS   60 tablet   11   . omeprazole (PRILOSEC) 20 MG capsule   Oral   Take 20 mg by mouth daily.         Marland Kitchen terbinafine (LAMISIL) 1 % cream   Topical   Apply 1 application topically 2 (two) times a week.           Allergies Sulfa antibiotics and Keflex  Family History  Problem Relation Age of Onset  . Diabetes Father   . Cancer Maternal Aunt 63    breast    Social History Social History  Substance Use Topics  . Smoking status: Never Smoker   . Smokeless tobacco: Never Used  . Alcohol Use: No    Review of Systems Constitutional: No feve ENT: No URI CGastrointestinal: no vomiting.  No  diarrhea.   Endocrine:No recent weight change 10-point ROS otherwise negative.  ____________________________________________   PHYSICAL EXAM:  VITAL SIGNS: ED Triage Vitals  Enc Vitals Group     BP 09/26/14 1217 125/61 mmHg     Pulse Rate 09/26/14 1216 98     Resp 09/26/14 1216 22     Temp 09/26/14 1216 98.4 F (36.9 C)     Temp Source 09/26/14 1216 Oral     SpO2 09/26/14 1216 98 %     Weight 09/26/14 1216 170 lb (77.111 kg)     Height 09/26/14 1216 5' (1.524 m)     Head Cir --      Peak Flow --      Pain Score 09/26/14 1217 10     Pain Loc --      Pain Edu? --      Excl. in [place]? --    Constitutional: Alert. Well appearing and in no acute distress. Responds to caregiver per baseline Eyes: Conjunctivae are normal. PERRL. EOMI. Head: Atraumatic. Nose: No congestion/rhinnorhea. Mouth/Throat: Mucous membranes are moist.  Oropharynx non-erythematous. Missing multiple teeth Neck: No stridor.   Lymphatic: No cervical lymphadenopathy. Cardiovascular: Normal rate, regular rhythm. Grossly normal heart sounds.  Peripheral pulses 2+ B Respiratory: Normal respiratory effort.  No retractions. Lungs CTAB. Gastrointestinal: Soft and nontender. No distention. Normal bowel sounds.  Musculoskeletal: No lower extremity tenderness nor edema.  No calf TTP. Neurologic: Answers yes to questions. Strength 5 out of 54 extremities. Normal finger to nose bilaterally. Moves face symmetrically.  Skin:  Skin is warm, dry and intact. No rash noted. Psychiatric: Mood and affect are pleasant per baseline. Speech and behavior are at her baseline.  ____________________________________________   LABS (all labs ordered are listed, but only abnormal results are displayed)  Labs Reviewed  COMPREHENSIVE METABOLIC PANEL - Abnormal; Notable for the following:    Chloride 98 (*)    Glucose, Bld 118 (*)    BUN 24 (*)    All other components within normal limits  LIPASE, BLOOD - Abnormal; Notable for the  following:    Lipase 20 (*)    All other components within  normal limits  CBC WITH DIFFERENTIAL/PLATELET - Abnormal; Notable for the following:    Neutro Abs 7.4 (*)    Monocytes Absolute 1.1 (*)    Basophils Absolute 0.2 (*)    All other components within normal limits  URINALYSIS COMPLETEWITH MICROSCOPIC ([facility] ONLY) - Abnormal; Notable for the following:    Color, Urine YELLOW (*)    APPearance CLEAR (*)    Ketones, ur TRACE (*)    Squamous Epithelial / LPF 0-5 (*)    All other components within normal limits  TROPONIN I  LAMOTRIGINE LEVEL   ____________________________________________  EKG   Date: 09/26/2014  Rate: 93  Rhythm: normal sinus rhythm  QRS Axis: normal  Intervals: normal  ST/T Wave abnormalities: normal  Conduction Disutrbances: none  Narrative Interpretation: unremarkable     ____________________________________________  RADIOLOGY  CT head-IMPRESSION: 1. No acute intracranial pathology. 2. Chronic microvascular disease and cerebral atrophy.  ____________________________________________   PROCEDURES  Procedure(s) performed: none  Critical Care performed: none ____________________________________________   INITIAL IMPRESSION / ASSESSMENT AND PLAN / ED COURSE  Pertinent labs & imaging results that were available during my care of the patient were reviewed by me and considered in my medical decision making (see chart for details).  ----------------------------------------- 6:00 PM on 09/26/2014 ----------------------------------------- On nursing evaluation patient with a gait consistent with her baseline per her caregiver who watched patient ambulate. She ambulates with a limp at baseline.  ----------------------------------------- 6:55 PM on 09/26/2014 -----------------------------------------  Patient appears to be acting and moving at her baseline. Will discharge to home with close follow-up with her primary care provider. It appears  that her gait disturbance was transient as we have not seen it in the ER.  ____________________________________________   FINAL CLINICAL IMPRESSION(S) / ED DIAGNOSES  transient gait disturbance    Ponciano Ort, MD 09/26/14 1856

## 2014-09-26 NOTE — ED Notes (Signed)
Patient and caregiver with no complaints at this time. Respirations even and unlabored. Skin warm/dry. Discharge instructions reviewed with patient and caregiver at this time. Patient and caregiver given opportunity to voice concerns/ask questions. Patient discharged at this time and left Emergency Department, via wheelchair.

## 2014-09-26 NOTE — Discharge Instructions (Signed)
Return to the emergency department for new or worsening symptoms, difficulty walking, fever, vomiting, numbness or weakness on one side or the other, or for any other concerns.  Fall Prevention and Home Safety Falls cause injuries and can affect all age groups. It is possible to prevent falls.  HOW TO PREVENT FALLS  Wear shoes with rubber soles that do not have an opening for your toes.  Keep the inside and outside of your house well lit.  Use night lights throughout your home.  Remove clutter from floors.  Clean up floor spills.  Remove throw rugs or fasten them to the floor with carpet tape.  Do not place electrical cords across pathways.  Put grab bars by your tub, shower, and toilet. Do not use towel bars as grab bars.  Put handrails on both sides of the stairway. Fix loose handrails.  Do not climb on stools or stepladders, if possible.  Do not wax your floors.  Repair uneven or unsafe sidewalks, walkways, or stairs.  Keep items you use a lot within reach.  Be aware of pets.  Keep emergency numbers next to the telephone.  Put smoke detectors in your home and near bedrooms. Ask your doctor what other things you can do to prevent falls. Document Released: 11/24/2008 Document Revised: 07/30/2011 Document Reviewed: 04/30/2011 [facility] Patient Information 2015 [place], [place]. This information is not intended to replace advice given to you by your health care provider. Make sure you discuss any questions you have with your health care provider.   Limp Your child has a limp. This is most probably due to a minor sprain or bruise. When children limp or show other signs of not wanting to bear weight on one leg (like crawling when they can already walk), they usually do not have a serious injury. A minor injury such as a fall may cause hip pain for several days. If your child can point to the spot that hurts, this can help with the diagnosis. Most children will get better after  1-2 days of rest.  If there is no improvement, your child needs to be evaluated. As part of an evaluation, your child may have some tests performed such as x-rays, ultrasound and blood tests. Sometimes, more invasive testing such as inserting a needle into the hip joint or bone is required to see if there is an infection. SEEK IMMEDIATE MEDICAL CARE IF:  Fever develops.  There is swelling at any site.  Your child has tenderness or a painful spot on the leg where you touch or press.  There is a red area on the leg.  Your child is not feeling well or is too sleepy or irritable. Document Released: 03/07/2004 Document Revised: 04/22/2011 Document Reviewed: 05/19/2008 [facility] Patient Information 2015 [place], [place]. This information is not intended to replace advice given to you by your health care provider. Make sure you discuss any questions you have with your health care provider.

## 2014-09-27 LAB — LAMOTRIGINE LEVEL: Lamotrigine Lvl: 16.2 ug/mL (ref 2.0–20.0)

## 2014-10-04 ENCOUNTER — Other Ambulatory Visit: Payer: Self-pay

## 2014-10-04 MED ORDER — CLINDAMYCIN PHOSPHATE 1 % EX SOLN
Freq: Every day | CUTANEOUS | Status: DC
Start: 2014-10-04 — End: 2015-11-30

## 2014-10-20 ENCOUNTER — Other Ambulatory Visit: Payer: Self-pay | Admitting: Family Medicine

## 2014-10-25 DIAGNOSIS — S7011XA Contusion of right thigh, initial encounter: Secondary | ICD-10-CM | POA: Diagnosis not present

## 2014-10-25 DIAGNOSIS — W19XXXA Unspecified fall, initial encounter: Secondary | ICD-10-CM | POA: Diagnosis not present

## 2014-10-29 ENCOUNTER — Ambulatory Visit (INDEPENDENT_AMBULATORY_CARE_PROVIDER_SITE_OTHER): Payer: Medicare Other

## 2014-10-29 DIAGNOSIS — Z23 Encounter for immunization: Secondary | ICD-10-CM | POA: Diagnosis not present

## 2014-11-08 ENCOUNTER — Ambulatory Visit
Admission: RE | Admit: 2014-11-08 | Discharge: 2014-11-08 | Disposition: A | Discharge status: Home / Self Care | Payer: Medicare Other | Source: Ambulatory Visit | Attending: General Surgery | Admitting: General Surgery

## 2014-11-08 ENCOUNTER — Other Ambulatory Visit: Payer: Self-pay | Admitting: General Surgery

## 2014-11-08 DIAGNOSIS — Z853 Personal history of malignant neoplasm of breast: Secondary | ICD-10-CM | POA: Insufficient documentation

## 2014-11-08 DIAGNOSIS — C50912 Malignant neoplasm of unspecified site of left female breast: Secondary | ICD-10-CM

## 2014-11-08 DIAGNOSIS — N63 Unspecified lump in breast: Secondary | ICD-10-CM | POA: Diagnosis not present

## 2014-11-08 DIAGNOSIS — R928 Other abnormal and inconclusive findings on diagnostic imaging of breast: Secondary | ICD-10-CM | POA: Diagnosis not present

## 2014-11-08 DIAGNOSIS — N6489 Other specified disorders of breast: Secondary | ICD-10-CM | POA: Diagnosis not present

## 2014-11-08 HISTORY — DX: Malignant neoplasm of unspecified site of unspecified female breast: C50.919

## 2014-11-11 ENCOUNTER — Other Ambulatory Visit: Payer: Self-pay | Admitting: Family Medicine

## 2014-11-15 DIAGNOSIS — M87851 Other osteonecrosis, right femur: Secondary | ICD-10-CM | POA: Diagnosis not present

## 2014-11-17 ENCOUNTER — Encounter: Payer: Self-pay | Admitting: General Surgery

## 2014-11-17 ENCOUNTER — Ambulatory Visit (INDEPENDENT_AMBULATORY_CARE_PROVIDER_SITE_OTHER): Payer: Medicare Other | Admitting: General Surgery

## 2014-11-17 VITALS — BP 124/76 | HR 66 | Resp 12 | Ht 60.0 in | Wt 145.0 lb

## 2014-11-17 DIAGNOSIS — C50912 Malignant neoplasm of unspecified site of left female breast: Secondary | ICD-10-CM | POA: Diagnosis not present

## 2014-11-17 NOTE — Progress Notes (Signed)
Patient ID: Helen Patton, female   DOB: [birth year], 54 y.o.   MRN: [id number]  Chief Complaint  Patient presents with  . Follow-up    mammogram    HPI Helen Patton is a 54 y.o. female.  who presents for her 6 month follow up and breast evaluation. The most recent mammogram was done on 11-08-14.  Patient does perform regular self breast checks and gets regular mammograms done.  No new breast issues. She has had 2 falls recently and has been evaluated by Dr Earnestine Leys. She is here today with her caregiver from [place] home and her mother. Tolerating Femara.  HPI  Past Medical History  Diagnosis Date  . Mental retardation   . GERD (gastroesophageal reflux disease)   . Osteoporosis   . Hypertension   . Seizures ([place])   . MRSA (methicillin resistant staph aureus) culture positive 2012  . Hidradenitis 2012    MRSA, on chronic topical suppression  . Cancer [facility]) Oct 2015    invasive mammary carcinoma  . Breast cancer ([place]) 2015    [name]  . Falls     Past Surgical History  Procedure Laterality Date  . Broken leg    . Dislocated hip    . Cervical ablation    . Breast surgery Left 12-07-13    T1b, Nx; ER+, PR+, her 2 pending. (Not candidate for chemotherapy)    Family History  Problem Relation Age of Onset  . Diabetes Father   . Cancer Maternal Aunt 63    breast  . Breast cancer Maternal Aunt     Social History Social History  Substance Use Topics  . Smoking status: Never Smoker   . Smokeless tobacco: Never Used  . Alcohol Use: No    Allergies  Allergen Reactions  . Sulfa Antibiotics   . Keflex [Cephalexin] Other (See Comments)    unknown    Current Outpatient Prescriptions  Medication Sig Dispense Refill  . alendronate (FOSAMAX) 70 MG tablet 1 TAB PO WEEKLY EARLY AM BEFORE FOOD/MEDS W/WATER. DON'T LIE DOWN FOR30 MIN (OSTEOPOROSIS) *NO CRUSH* 5 tablet 12  . BENZOYL PEROXIDE 5 % external wash USE TO WASH AFFECTED AREA  DAILY 237 g 0  . calcium-vitamin D (OSCAL WITH D) 500-200 MG-UNIT per tablet Take 1 tablet by mouth daily with breakfast. 30 tablet 12  . clindamycin (CLEOCIN T) 1 % external solution Apply topically daily. 60 mL 12  . docusate sodium (COLACE) 100 MG capsule Take 100 mg by mouth 2 (two) times daily.    . DULoxetine (CYMBALTA) 60 MG capsule Take 60 mg by mouth daily.    Marland Kitchen lamoTRIgine (LAMICTAL) 150 MG tablet Take 300 mg by mouth 2 (two) times daily.     Marland Kitchen letrozole (FEMARA) 2.5 MG tablet Take 2.5 mg by mouth daily.    Marland Kitchen loratadine (CLARITIN) 10 MG tablet Take 10 mg by mouth daily.    Marland Kitchen LORazepam (ATIVAN) 1 MG tablet Take 1 mg by mouth every 8 (eight) hours as needed for anxiety.    Marland Kitchen losartan (COZAAR) 100 MG tablet Take 100 mg by mouth daily.    . meloxicam (MOBIC) 7.5 MG tablet TAKE ONE TABLET BY MOUTH 2 TIMES A DAY WITH MEALS 60 tablet 11  . omeprazole (PRILOSEC) 20 MG capsule Take 20 mg by mouth daily.    Marland Kitchen terbinafine (LAMISIL) 1 % cream As directed previously 30 g 0   No current facility-administered medications for this visit.  Review of Systems Review of Systems  Constitutional: Negative.   Respiratory: Negative.   Cardiovascular: Negative.     Blood pressure 124/76, pulse 66, resp. rate 12, height 5' (1.524 m), weight 145 lb (65.772 kg).  Physical Exam Physical Exam  Constitutional: She appears well-developed and well-nourished.  HENT:  Mouth/Throat: Oropharynx is clear and moist.  Eyes: Conjunctivae are normal. No scleral icterus.  Pulmonary/Chest: Right breast exhibits no inverted nipple, no mass, no nipple discharge, no skin change and no tenderness. Left breast exhibits no inverted nipple, no mass, no nipple discharge, no skin change and no tenderness.    Well healed incision at 5 o'clock left breast.  Lymphadenopathy:    She has no axillary adenopathy.  Neurological: She is alert.  Skin: Skin is warm and dry.  Psychiatric: She has a normal mood and affect.     Data Reviewed Bilateral mammograms and right ultrasound dated 11/08/2014 showed no definable new abnormality except for post biopsy changes. BI-RADS-2.  Assessment    Doing well status post breast conservation.    Plan          Follow up in one year with bilateral diagnostic mammogram and office visit.   PCP:  Cranford Mon, Ilsa Iha 11/18/2014, 5:26 PM

## 2014-11-17 NOTE — Patient Instructions (Addendum)
The patient is aware to call back for any questions or concerns. Follow up in one year with bilateral diagnostic mammogram and office visit.

## 2014-11-28 DIAGNOSIS — R296 Repeated falls: Secondary | ICD-10-CM | POA: Diagnosis not present

## 2014-11-28 DIAGNOSIS — S7291XA Unspecified fracture of right femur, initial encounter for closed fracture: Secondary | ICD-10-CM | POA: Diagnosis not present

## 2014-11-28 DIAGNOSIS — M25551 Pain in right hip: Secondary | ICD-10-CM | POA: Diagnosis not present

## 2014-11-28 DIAGNOSIS — M25561 Pain in right knee: Secondary | ICD-10-CM | POA: Diagnosis not present

## 2014-12-12 ENCOUNTER — Emergency Department
Admission: EM | Admit: 2014-12-12 | Discharge: 2014-12-12 | Disposition: A | Discharge status: Home / Self Care | Payer: Medicare Other | Attending: Emergency Medicine | Admitting: Emergency Medicine

## 2014-12-12 ENCOUNTER — Encounter: Payer: Self-pay | Admitting: Emergency Medicine

## 2014-12-12 DIAGNOSIS — F79 Unspecified intellectual disabilities: Secondary | ICD-10-CM | POA: Insufficient documentation

## 2014-12-12 DIAGNOSIS — I10 Essential (primary) hypertension: Secondary | ICD-10-CM | POA: Diagnosis not present

## 2014-12-12 DIAGNOSIS — Y998 Other external cause status: Secondary | ICD-10-CM | POA: Diagnosis not present

## 2014-12-12 DIAGNOSIS — Z79899 Other long term (current) drug therapy: Secondary | ICD-10-CM | POA: Insufficient documentation

## 2014-12-12 DIAGNOSIS — Z791 Long term (current) use of non-steroidal anti-inflammatories (NSAID): Secondary | ICD-10-CM | POA: Diagnosis not present

## 2014-12-12 DIAGNOSIS — W19XXXA Unspecified fall, initial encounter: Secondary | ICD-10-CM

## 2014-12-12 DIAGNOSIS — W1839XA Other fall on same level, initial encounter: Secondary | ICD-10-CM | POA: Diagnosis not present

## 2014-12-12 DIAGNOSIS — Z792 Long term (current) use of antibiotics: Secondary | ICD-10-CM | POA: Insufficient documentation

## 2014-12-12 DIAGNOSIS — Y9389 Activity, other specified: Secondary | ICD-10-CM | POA: Insufficient documentation

## 2014-12-12 DIAGNOSIS — Y9289 Other specified places as the place of occurrence of the external cause: Secondary | ICD-10-CM | POA: Insufficient documentation

## 2014-12-12 DIAGNOSIS — S0990XA Unspecified injury of head, initial encounter: Secondary | ICD-10-CM | POA: Diagnosis present

## 2014-12-12 NOTE — ED Notes (Signed)
Pt to ed with c/o fall today.  Caregiver reports pt fell backwards.  Pt was able to get up with assistance. Pt from [facility] group home.

## 2014-12-12 NOTE — Discharge Instructions (Signed)
Fall Prevention in the Home  Falls can cause injuries and can affect people from all age groups. There are many simple things that you can do to make your home safe and to help prevent falls. WHAT CAN I DO ON THE OUTSIDE OF MY HOME?  Regularly repair the edges of walkways and driveways and fix any cracks.  Remove high doorway thresholds.  Trim any shrubbery on the main path into your home.  Use bright outdoor lighting.  Clear walkways of debris and clutter, including tools and rocks.  Regularly check that handrails are securely fastened and in good repair. Both sides of any steps should have handrails.  Install guardrails along the edges of any raised decks or porches.  Have leaves, snow, and ice cleared regularly.  Use sand or salt on walkways during winter months.  In the garage, clean up any spills right away, including grease or oil spills. WHAT CAN I DO IN THE BATHROOM?  Use night lights.  Install grab bars by the toilet and in the tub and shower. Do not use towel bars as grab bars.  Use non-skid mats or decals on the floor of the tub or shower.  If you need to sit down while you are in the shower, use a plastic, non-slip stool..  Keep the floor dry. Immediately clean up any water that spills on the floor.  Remove soap buildup in the tub or shower on a regular basis.  Attach bath mats securely with double-sided non-slip rug tape.  Remove throw rugs and other tripping hazards from the floor. WHAT CAN I DO IN THE BEDROOM?  Use night lights.  Make sure that a bedside light is easy to reach.  Do not use oversized bedding that drapes onto the floor.  Have a firm chair that has side arms to use for getting dressed.  Remove throw rugs and other tripping hazards from the floor. WHAT CAN I DO IN THE KITCHEN?   Clean up any spills right away.  Avoid walking on wet floors.  Place frequently used items in easy-to-reach places.  If you need to reach for something  above you, use a sturdy step stool that has a grab bar.  Keep electrical cables out of the way.  Do not use floor polish or wax that makes floors slippery. If you have to use wax, make sure that it is non-skid floor wax.  Remove throw rugs and other tripping hazards from the floor. WHAT CAN I DO IN THE STAIRWAYS?  Do not leave any items on the stairs.  Make sure that there are handrails on both sides of the stairs. Fix handrails that are broken or loose. Make sure that handrails are as long as the stairways.  Check any carpeting to make sure that it is firmly attached to the stairs. Fix any carpet that is loose or worn.  Avoid having throw rugs at the top or bottom of stairways, or secure the rugs with carpet tape to prevent them from moving.  Make sure that you have a light switch at the top of the stairs and the bottom of the stairs. If you do not have them, have them installed. WHAT ARE SOME OTHER FALL PREVENTION TIPS?  Wear closed-toe shoes that fit well and support your feet. Wear shoes that have rubber soles or low heels.  When you use a stepladder, make sure that it is completely opened and that the sides are firmly locked. Have someone hold the ladder while you   are using it. Do not climb a closed stepladder.  Add color or contrast paint or tape to grab bars and handrails in your home. Place contrasting color strips on the first and last steps.  Use mobility aids as needed, such as canes, walkers, scooters, and crutches.  Turn on lights if it is dark. Replace any light bulbs that burn out.  Set up furniture so that there are clear paths. Keep the furniture in the same spot.  Fix any uneven floor surfaces.  Choose a carpet design that does not hide the edge of steps of a stairway.  Be aware of any and all pets.  Review your medicines with your healthcare provider. Some medicines can cause dizziness or changes in blood pressure, which increase your risk of falling. Talk  with your health care provider about other ways that you can decrease your risk of falls. This may include working with a physical therapist or trainer to improve your strength, balance, and endurance.   This information is not intended to replace advice given to you by your health care provider. Make sure you discuss any questions you have with your health care provider.   Document Released: 01/18/2002 Document Revised: 06/14/2014 Document Reviewed: 03/04/2014 Elsevier Interactive Patient Education 2016 [employer]  

## 2014-12-12 NOTE — ED Notes (Signed)
Per her caregiver she fell this am while walking into her workshop. Hit back of head   No LOC .small hematoma noted to back of head

## 2014-12-12 NOTE — ED Provider Notes (Signed)
[facility] Emergency Department Provider Note  ____________________________________________  Time seen: Approximately 10:57 AM  I have reviewed the triage vital signs and the nursing notes.   HISTORY  Chief Complaint Fall    HPI Helen Patton is a 54 y.o. female resents emergency department status post fall. The patient does have a history of mental retardation and is nonverbal. Per the patient's caregiver the patient had a mechanical fall this morning falling and striking the posterior aspect of her head. There was no loss of consciousness. No laceration or hematoma has been observed prior to arrival. Per the caregiver the patient has been acting "normally". No other complaint at this time.   Past Medical History  Diagnosis Date  . Mental retardation   . GERD (gastroesophageal reflux disease)   . Osteoporosis   . Hypertension   . Seizures ([place])   . MRSA (methicillin resistant staph aureus) culture positive 2012  . Hidradenitis 2012    MRSA, on chronic topical suppression  . Cancer [facility]) Oct 2015    invasive mammary carcinoma  . Breast cancer ([place]) 2015    [name]  . Falls     Patient Active Problem List   Diagnosis Date Noted  . Breast cancer ([place]) 11/21/2013  . Mental retardation 11/16/2013    Past Surgical History  Procedure Laterality Date  . Broken leg    . Dislocated hip    . Cervical ablation    . Breast surgery Left 12-07-13    T1b, Nx; ER+, PR+, her 2 pending. (Not candidate for chemotherapy)    Current Outpatient Rx  Name  Route  Sig  Dispense  Refill  . alendronate (FOSAMAX) 70 MG tablet      1 TAB PO WEEKLY EARLY AM BEFORE FOOD/MEDS W/WATER. DON'T LIE DOWN FOR30 MIN (OSTEOPOROSIS) *NO CRUSH*   5 tablet   12   . BENZOYL PEROXIDE 5 % external wash      USE TO WASH AFFECTED AREA DAILY   237 g   0   . calcium-vitamin D (OSCAL WITH D) 500-200 MG-UNIT per tablet   Oral   Take 1 tablet by mouth daily  with breakfast.   30 tablet   12   . clindamycin (CLEOCIN T) 1 % external solution   Topical   Apply topically daily.   60 mL   12   . docusate sodium (COLACE) 100 MG capsule   Oral   Take 100 mg by mouth 2 (two) times daily.         . DULoxetine (CYMBALTA) 60 MG capsule   Oral   Take 60 mg by mouth daily.         Marland Kitchen lamoTRIgine (LAMICTAL) 150 MG tablet   Oral   Take 300 mg by mouth 2 (two) times daily.          Marland Kitchen letrozole (FEMARA) 2.5 MG tablet   Oral   Take 2.5 mg by mouth daily.         Marland Kitchen loratadine (CLARITIN) 10 MG tablet   Oral   Take 10 mg by mouth daily.         Marland Kitchen LORazepam (ATIVAN) 1 MG tablet   Oral   Take 1 mg by mouth every 8 (eight) hours as needed for anxiety.         Marland Kitchen losartan (COZAAR) 100 MG tablet   Oral   Take 100 mg by mouth daily.         Marland Kitchen  meloxicam (MOBIC) 7.5 MG tablet      TAKE ONE TABLET BY MOUTH 2 TIMES A DAY WITH MEALS   60 tablet   11   . omeprazole (PRILOSEC) 20 MG capsule   Oral   Take 20 mg by mouth daily.         Marland Kitchen terbinafine (LAMISIL) 1 % cream      As directed previously   30 g   0     Allergies Sulfa antibiotics and Keflex  Family History  Problem Relation Age of Onset  . Diabetes Father   . Cancer Maternal Aunt 63    breast  . Breast cancer Maternal Aunt     Social History Social History  Substance Use Topics  . Smoking status: Never Smoker   . Smokeless tobacco: Never Used  . Alcohol Use: No    Review of Systems Constitutional: No fever/chills Eyes: No visual changes. ENT: No sore throat. Cardiovascular: Denies chest pain. Respiratory: Denies shortness of breath. Gastrointestinal: No vomiting.  No diarrhea.  No constipation. Genitourinary: Negative for dysuria. Musculoskeletal: Negative for back pain. Skin: Negative for rash. Neurological: Unable to ascertain. Patient acting "per normal" per caregiver  10-point ROS otherwise  negative.  ____________________________________________   PHYSICAL EXAM:  VITAL SIGNS: ED Triage Vitals  Enc Vitals Group     BP 12/12/14 1018 125/67 mmHg     Pulse Rate 12/12/14 1018 90     Resp 12/12/14 1018 20     Temp 12/12/14 1018 98.5 F (36.9 C)     Temp Source 12/12/14 1018 Oral     SpO2 12/12/14 1018 96 %     Weight 12/12/14 1013 145 lb (65.772 kg)     Height 12/12/14 1013 5' (1.524 m)     Head Cir --      Peak Flow --      Pain Score --      Pain Loc --      Pain Edu? --      Excl. in [place]? --     Constitutional: Alert and responding to caregiver appropriately. Well appearing and in no acute distress. Eyes: Conjunctivae are normal. PERRL. EOMI. Head: Atraumatic. Nose: No congestion/rhinnorhea. Mouth/Throat: Mucous membranes are moist.  Oropharynx non-erythematous. Neck: No stridor.  No cervical spine tenderness to palpation. Cardiovascular: Normal rate, regular rhythm. Grossly normal heart sounds.  Good peripheral circulation. Respiratory: Normal respiratory effort.  No retractions. Lungs CTAB. Gastrointestinal: Soft and nontender. No distention. No abdominal bruits. No CVA tenderness. Musculoskeletal: No lower extremity tenderness nor edema.  No joint effusions. Neurologic:  Normal speech and language. No gross focal neurologic deficits are appreciated. No gait instability. Skin:  Skin is warm, dry and intact. No rash noted. Psychiatric: Mood and affect are normal. Speech and behavior are normal.  ____________________________________________   LABS (all labs ordered are listed, but only abnormal results are displayed)  Labs Reviewed - No data to display ____________________________________________  EKG   ____________________________________________  RADIOLOGY   ____________________________________________   PROCEDURES  Procedure(s) performed: None  Critical Care performed: No  ____________________________________________   INITIAL  IMPRESSION / ASSESSMENT AND PLAN / ED COURSE  Pertinent labs & imaging results that were available during my care of the patient were reviewed by me and considered in my medical decision making (see chart for details).  Agents history, symptoms, physical exam are taken into consideration for diagnosis. Patient is a mentally retarded 54 year old female who presents in the care of her caregiver for fall this  morning. He is reported to strike her head with no loss of consciousness. The patient has been acting "normally" per the caregiver since this occurrence. No visible signs of trauma or observed. Family is limited due to patient's limited interaction. Due to the patient's permanent GCS below 15 I consulted with attending Dr. Thomasene Lot to determine need for CT imaging of the brain. Since the patient was acting normal, and there was no gross abnormality detected on physical exam, I determined not to pursue a CT scan which was seconded by Dr. Thomasene Lot. I informed the patient's caregiver of diagnosis and treatment course. She verbalizes understanding of this and verbalizes that at this time she agrees with no CAT scan. I gave strict ED precautions to return for any increase in symptoms or change from patient's baseline. Her caregiver is aware of this and states that she will call 911 should any of these symptoms appear and they will return to the emergency department. Discharged back to group home in care of her caregiver. ____________________________________________   FINAL CLINICAL IMPRESSION(S) / ED DIAGNOSES  Final diagnoses:  Fall, initial encounter  Mental retardation      Darletta Moll, PA-C 12/12/14 1210  I discussed the case with J.D. Cuthriell, agreed in not obtaining a CT scan of the head for this event that had no loss of consciousness, no acute findings on physical exam, and no change in behavior or communication. I have further review the documentation above.   Ahmed Prima, MD 12/12/14 [phone]

## 2014-12-14 DIAGNOSIS — F32A Depression, unspecified: Secondary | ICD-10-CM | POA: Insufficient documentation

## 2014-12-14 DIAGNOSIS — F329 Major depressive disorder, single episode, unspecified: Secondary | ICD-10-CM | POA: Insufficient documentation

## 2014-12-14 DIAGNOSIS — G809 Cerebral palsy, unspecified: Secondary | ICD-10-CM | POA: Insufficient documentation

## 2014-12-14 DIAGNOSIS — R296 Repeated falls: Secondary | ICD-10-CM | POA: Diagnosis not present

## 2014-12-14 DIAGNOSIS — M25559 Pain in unspecified hip: Secondary | ICD-10-CM | POA: Insufficient documentation

## 2014-12-14 DIAGNOSIS — E669 Obesity, unspecified: Secondary | ICD-10-CM | POA: Insufficient documentation

## 2014-12-14 DIAGNOSIS — E785 Hyperlipidemia, unspecified: Secondary | ICD-10-CM | POA: Insufficient documentation

## 2014-12-14 DIAGNOSIS — A4902 Methicillin resistant Staphylococcus aureus infection, unspecified site: Secondary | ICD-10-CM | POA: Insufficient documentation

## 2014-12-14 DIAGNOSIS — R569 Unspecified convulsions: Secondary | ICD-10-CM | POA: Diagnosis not present

## 2014-12-14 DIAGNOSIS — K219 Gastro-esophageal reflux disease without esophagitis: Secondary | ICD-10-CM | POA: Insufficient documentation

## 2014-12-14 DIAGNOSIS — Z5181 Encounter for therapeutic drug level monitoring: Secondary | ICD-10-CM | POA: Diagnosis not present

## 2014-12-14 DIAGNOSIS — B353 Tinea pedis: Secondary | ICD-10-CM | POA: Insufficient documentation

## 2014-12-14 DIAGNOSIS — F79 Unspecified intellectual disabilities: Secondary | ICD-10-CM | POA: Diagnosis not present

## 2014-12-14 DIAGNOSIS — F419 Anxiety disorder, unspecified: Secondary | ICD-10-CM | POA: Insufficient documentation

## 2014-12-14 DIAGNOSIS — R4189 Other symptoms and signs involving cognitive functions and awareness: Secondary | ICD-10-CM | POA: Insufficient documentation

## 2014-12-14 DIAGNOSIS — I10 Essential (primary) hypertension: Secondary | ICD-10-CM | POA: Insufficient documentation

## 2014-12-20 ENCOUNTER — Ambulatory Visit: Payer: Self-pay | Admitting: Family Medicine

## 2014-12-20 DIAGNOSIS — H25813 Combined forms of age-related cataract, bilateral: Secondary | ICD-10-CM | POA: Diagnosis not present

## 2014-12-21 DIAGNOSIS — M6281 Muscle weakness (generalized): Secondary | ICD-10-CM | POA: Diagnosis not present

## 2014-12-21 DIAGNOSIS — R2681 Unsteadiness on feet: Secondary | ICD-10-CM | POA: Diagnosis not present

## 2014-12-27 ENCOUNTER — Other Ambulatory Visit: Payer: Self-pay | Admitting: Family Medicine

## 2014-12-28 ENCOUNTER — Ambulatory Visit (INDEPENDENT_AMBULATORY_CARE_PROVIDER_SITE_OTHER): Payer: Medicare Other | Admitting: Family Medicine

## 2014-12-28 ENCOUNTER — Encounter: Payer: Self-pay | Admitting: Family Medicine

## 2014-12-28 VITALS — BP 124/60 | HR 88 | Temp 97.5°F | Resp 18 | Wt 145.0 lb

## 2014-12-28 DIAGNOSIS — R296 Repeated falls: Secondary | ICD-10-CM | POA: Diagnosis not present

## 2014-12-28 NOTE — Progress Notes (Signed)
Patient ID: Helen Patton, Patton   DOB: 22-May-1960, 54 y.o.   MRN: [id number]       Patient: Helen Patton    DOB: 1960-10-13   54 y.o.   MRN: [id number] Visit Date: 12/28/2014  Today's Provider: Wilhemena Durie, MD   Chief Complaint  Patient presents with  . Fall    Patient needs an order to get a walker to prevent falls.    Subjective:    Fall  Patient is here to have orders signed to have a walker to help with ambulation. Patient has already had a neurology work-up through Dr. Manuella Ghazi, which caregiver reports was negative. Patient does have a history of re-current falls. Patient is also requesting an order be signed for a gait belt to help with ambulating.      Allergies  Allergen Reactions  . Sulfa Antibiotics   . Keflex [Cephalexin] Other (See Comments)    unknown   Previous Medications   ALENDRONATE (FOSAMAX) 70 MG TABLET    1 TAB PO WEEKLY EARLY AM BEFORE FOOD/MEDS W/WATER. DON'T LIE DOWN FOR30 MIN (OSTEOPOROSIS) *NO CRUSH*   BENZOYL PEROXIDE 5 % EXTERNAL WASH    USE TO WASH AFFECTED AREA DAILY   CALCIUM-VITAMIN D (OSCAL WITH D) 500-200 MG-UNIT PER TABLET    Take 1 tablet by mouth daily with breakfast.   CLINDAMYCIN (CLEOCIN T) 1 % EXTERNAL SOLUTION    Apply topically daily.   DOCUSATE SODIUM (COLACE) 100 MG CAPSULE    Take 100 mg by mouth 2 (two) times daily.   DULOXETINE (CYMBALTA) 60 MG CAPSULE    Take 60 mg by mouth daily.   LAMOTRIGINE (LAMICTAL) 100 MG TABLET    TAKE 3 TABLETS (300MG ) BY MOUTH 2 TIMES A DAY   LAMOTRIGINE (LAMICTAL) 150 MG TABLET    Take 300 mg by mouth 2 (two) times daily.    LETROZOLE (FEMARA) 2.5 MG TABLET    Take 2.5 mg by mouth daily.   LORATADINE (CLARITIN) 10 MG TABLET    Take 10 mg by mouth daily.   LORAZEPAM (ATIVAN) 1 MG TABLET    Take 1 mg by mouth every 8 (eight) hours as needed for anxiety.   LOSARTAN (COZAAR) 100 MG TABLET    Take 100 mg by mouth daily.   MELOXICAM (MOBIC) 7.5 MG TABLET    TAKE ONE TABLET BY  MOUTH 2 TIMES A DAY WITH MEALS   OMEPRAZOLE (PRILOSEC) 20 MG CAPSULE    TAKE 1 CAPSULE BY MOUTH TWICE DAILY FOR GERD. (DO NOT CRUSH)   TERBINAFINE (LAMISIL) 1 % CREAM    As directed previously    Review of Systems  Constitutional: Negative.   HENT: Negative.   Eyes: Negative.   Respiratory: Negative.   Cardiovascular: Negative.   Musculoskeletal: Negative.   Neurological: Negative.   Psychiatric/Behavioral: Negative.     Social History  Substance Use Topics  . Smoking status: Never Smoker   . Smokeless tobacco: Never Used  . Alcohol Use: No   Objective:   BP 124/60 mmHg  Pulse 88  Temp(Src) 97.5 F (36.4 C)  Resp 18  Wt 145 lb (65.772 kg)  Physical Exam  Constitutional: She appears well-developed and well-nourished.  HENT:  Head: Normocephalic and atraumatic.  Right Ear: External ear normal.  Left Ear: External ear normal.  Nose: Nose normal.  Eyes: Conjunctivae are normal.  Neck: Neck supple.  Cardiovascular: Normal rate, regular rhythm, normal heart sounds and intact distal pulses.  Pulmonary/Chest: Effort normal and breath sounds normal.  Abdominal: Soft.  Neurological: She is alert. She has normal reflexes. No cranial nerve deficit.  Stable neurologic exam with congenital microcephaly. She is unable to communicate verbally. She is losing some strength as she ages and this is increasing her risk of falls.  Skin: Skin is warm and dry.  Psychiatric: She has a normal mood and affect. Her behavior is normal.        Assessment & Plan:     1. Falls frequently Consider use of walker. - Ambulatory referral to Physical Therapy 2.Congenital Microcephaly      Wilhemena Durie, MD  [facility]

## 2014-12-30 DIAGNOSIS — R2681 Unsteadiness on feet: Secondary | ICD-10-CM | POA: Diagnosis not present

## 2014-12-30 DIAGNOSIS — M6281 Muscle weakness (generalized): Secondary | ICD-10-CM | POA: Diagnosis not present

## 2015-02-01 ENCOUNTER — Ambulatory Visit (INDEPENDENT_AMBULATORY_CARE_PROVIDER_SITE_OTHER): Payer: Medicare Other | Admitting: Family Medicine

## 2015-02-01 DIAGNOSIS — M2550 Pain in unspecified joint: Secondary | ICD-10-CM | POA: Diagnosis not present

## 2015-02-01 DIAGNOSIS — E669 Obesity, unspecified: Secondary | ICD-10-CM

## 2015-02-01 DIAGNOSIS — F79 Unspecified intellectual disabilities: Secondary | ICD-10-CM | POA: Diagnosis not present

## 2015-02-01 MED ORDER — TRAMADOL HCL 50 MG PO TABS: 50.0000 mg | ORAL_TABLET | Freq: Four times a day (QID) | ORAL | Status: DC | PRN

## 2015-02-01 NOTE — Progress Notes (Signed)
Patient ID: Helen Patton, female   DOB: Jun 14, 1960, 54 y.o.   MRN: [id number]    Subjective:  HPI  Patient and caregiver is here to day to discuss joint issues. They were here on November 16th last. Physical therapy evaluation was ordered. Caregiver states patient was advised she does not need to do physical therapy and the orthopedic said not to do it. She is still having pain, mainly complaints of right hip and right knee pain, caregiver noticed that patient is not picking up her left leg up as high. She is using Mobic but it does not help, she takes Tylenol during the day. They wanted to see what else can patient try without it making her sedative. Also wanted to see if there was  Second opinion about what is going on. Patient is also seems to be more stiff.  Prior to Admission medications   Medication Sig Start Date End Date Taking? Authorizing Provider  alendronate (FOSAMAX) 70 MG tablet 1 TAB PO WEEKLY EARLY AM BEFORE FOOD/MEDS W/WATER. DON'T LIE DOWN FOR30 MIN (OSTEOPOROSIS) *NO CRUSH* 08/03/14  Yes Richard Maceo Pro., MD  BENZOYL PEROXIDE 5 % external wash USE TO [facility] AFFECTED AREA DAILY 10/24/14  Yes Richard Maceo Pro., MD  calcium-vitamin D (OSCAL WITH D) 500-200 MG-UNIT per tablet Take 1 tablet by mouth daily with breakfast. 08/03/14  Yes Jerrol Banana., MD  clindamycin (CLEOCIN T) 1 % external solution Apply topically daily. 10/04/14  Yes Richard Maceo Pro., MD  docusate sodium (COLACE) 100 MG capsule Take 100 mg by mouth 2 (two) times daily.   Yes Historical Provider, MD  DULoxetine (CYMBALTA) 60 MG capsule Take 60 mg by mouth daily.   Yes Historical Provider, MD  lamoTRIgine (LAMICTAL) 100 MG tablet TAKE 3 TABLETS (300MG ) BY MOUTH 2 TIMES A DAY 11/10/14  Yes Historical Provider, MD  lamoTRIgine (LAMICTAL) 150 MG tablet Take 300 mg by mouth 2 (two) times daily.    Yes Historical Provider, MD  letrozole (FEMARA) 2.5 MG tablet Take 2.5 mg by mouth daily.   Yes  Historical Provider, MD  loratadine (CLARITIN) 10 MG tablet Take 10 mg by mouth daily.   Yes Historical Provider, MD  LORazepam (ATIVAN) 1 MG tablet Take 1 mg by mouth every 8 (eight) hours as needed for anxiety.   Yes Historical Provider, MD  losartan (COZAAR) 100 MG tablet Take 100 mg by mouth daily.   Yes Historical Provider, MD  meloxicam (MOBIC) 7.5 MG tablet TAKE ONE TABLET BY MOUTH 2 TIMES A DAY WITH MEALS 07/27/14  Yes Richard Maceo Pro., MD  omeprazole (PRILOSEC) 20 MG capsule TAKE 1 CAPSULE BY MOUTH TWICE DAILY FOR GERD. (DO NOT CRUSH) 12/28/14  Yes Jerrol Banana., MD  terbinafine (LAMISIL) 1 % cream As directed previously 11/11/14  Yes Jerrol Banana., MD    Patient Active Problem List   Diagnosis Date Noted  . Anxiety 12/14/2014  . Arthralgia of hip 12/14/2014  . Athlete's foot 12/14/2014  . Cerebral palsy ([place]) 12/14/2014  . Cognitive decline 12/14/2014  . Clinical depression 12/14/2014  . Acid reflux 12/14/2014  . HLD (hyperlipidemia) 12/14/2014  . BP (high blood pressure) 12/14/2014  . Methicillin resistant Staphylococcus aureus infection 12/14/2014  . Adiposity 12/14/2014  . Breast cancer ([place]) 11/21/2013  . Mental retardation 11/16/2013  . Seizure ([place]) 09/30/2013    Past Medical History  Diagnosis Date  . Mental retardation   . GERD (gastroesophageal reflux disease)   .  Osteoporosis   . Hypertension   . Seizures ([place])   . MRSA (methicillin resistant staph aureus) culture positive 2012  . Hidradenitis 2012    MRSA, on chronic topical suppression  . Cancer [facility]) Oct 2015    invasive mammary carcinoma  . Breast cancer ([place]) 2015    [name]  . Falls     Social History   Social History  . Marital Status: Single    Spouse Name: N/A  . Number of Children: N/A  . Years of Education: N/A   Occupational History  . Not on file.   Social History Main Topics  . Smoking status: Never Smoker   . Smokeless tobacco: Never Used  .  Alcohol Use: No  . Drug Use: No  . Sexual Activity: Not on file   Other Topics Concern  . Not on file   Social History Narrative    Allergies  Allergen Reactions  . Sulfa Antibiotics   . Keflex [Cephalexin] Other (See Comments)    unknown    Review of Systems  Constitutional: Negative.   HENT: Negative.   Eyes: Negative.   Respiratory: Negative.   Cardiovascular: Negative.   Gastrointestinal: Negative.   Musculoskeletal: Positive for joint pain.  Skin: Negative.   Neurological: Negative.   Psychiatric/Behavioral: Negative.     Immunization History  Administered Date(s) Administered  . Influenza,inj,Quad PF,36+ Mos 10/29/2014   Objective:  There were no vitals taken for this visit.  Physical Exam  Constitutional: She is oriented to person, place, and time and well-developed, well-nourished, and in no distress.  HENT:  Head: Normocephalic and atraumatic.  Right Ear: External ear normal.  Left Ear: External ear normal.  Nose: Nose normal.  Eyes: Conjunctivae are normal.  Neck: Neck supple.  Cardiovascular: Normal rate, regular rhythm and normal heart sounds.   Pulmonary/Chest: Effort normal and breath sounds normal.  Abdominal: Soft.  Neurological: She is alert and oriented to person, place, and time.  Skin: Skin is warm and dry.  Psychiatric: Mood, memory, affect and judgment normal.    Lab Results  Component Value Date   WBC 9.9 09/26/2014   HGB 12.2 09/26/2014   HCT 37.5 09/26/2014   PLT 339 09/26/2014   GLUCOSE 118* 09/26/2014    CMP     Component Value Date/Time   NA 139 09/26/2014 1545   NA 146 06/17/2013   NA 136 01/08/2013 1742   K 4.3 09/26/2014 1545   K 4.0 01/08/2013 1742   CL 98* 09/26/2014 1545   CL 102 01/08/2013 1742   CO2 32 09/26/2014 1545   CO2 32 01/08/2013 1742   GLUCOSE 118* 09/26/2014 1545   GLUCOSE 122* 01/08/2013 1742   BUN 24* 09/26/2014 1545   BUN 13 06/17/2013   BUN 21* 01/08/2013 1742   CREATININE 0.87 09/26/2014  1545   CREATININE 0.6 06/17/2013   CREATININE 0.74 01/08/2013 1742   CALCIUM 9.4 09/26/2014 1545   CALCIUM 9.6 01/08/2013 1742   PROT 7.9 09/26/2014 1545   PROT 7.3 01/08/2013 1742   ALBUMIN 4.5 09/26/2014 1545   ALBUMIN 4.1 01/08/2013 1742   AST 24 09/26/2014 1545   AST 39* 01/08/2013 1742   ALT 16 09/26/2014 1545   ALT 50 01/08/2013 1742   ALKPHOS 79 09/26/2014 1545   ALKPHOS 108 01/08/2013 1742   BILITOT 0.6 09/26/2014 1545   BILITOT 0.4 01/08/2013 1742   GFRNONAA >60 09/26/2014 1545   GFRNONAA >60 01/08/2013 1742   GFRAA >60 09/26/2014 1545  GFRAA >60 01/08/2013 1742    Assessment and Plan :  1. Joint pain/OA of hips Not better. Per evaluation patient is not a candidate for physical therapy. Can try Tramadol. Stop Meloxicam since this is not effective. Refer to another orthopedic surgeon for a second opinion. Try every other week NSAID. More than 50% of the time of this visit spent in counseling with patient, mother, and caregiver. 2. Mental retardation/Micropcephaly  3. Adiposity 4.Falls --are biggest risk for pt. Patient was seen and examined by Dr. Eulas Post and note was scribed by Theressa Millard, RMA.   I have done the exam and reviewed the above chart and it is accurate to the best of my knowledge.  Miguel Aschoff MD [facility] 02/01/2015 2:33 PM

## 2015-02-01 NOTE — Patient Instructions (Signed)
Stop Meloxicam as of January 1st 2017. Start Tramadol.

## 2015-02-16 ENCOUNTER — Telehealth: Payer: Self-pay | Admitting: Family Medicine

## 2015-02-16 NOTE — Telephone Encounter (Signed)
Please review-aa 

## 2015-02-16 NOTE — Telephone Encounter (Signed)
Helen Patton from the group home called saying it was going to be several weeks before she can be seen at [name] office.  Helen Patton's right hip is really hurting a lot.  They need a stronger pain medication for her until she can get in with [name].  Their call back is [phone]   They use pharmacare.  Thanks  [employer]

## 2015-02-17 MED ORDER — HYDROCODONE-ACETAMINOPHEN 5-325 MG PO TABS: 1.0000 | ORAL_TABLET | Freq: Four times a day (QID) | ORAL | Status: DC | PRN

## 2015-02-17 NOTE — Telephone Encounter (Signed)
Please print out generic Hydrocodone/apap--5/325--1 q 6 hrs prn pain--#100 I have called and will call back.

## 2015-02-17 NOTE — Telephone Encounter (Signed)
Pt's care giver [name] called again about stronger pain medication. I advised that Dr. Rosanna Randy is usually out of the office on Fridays but he is coming in this afternoon. [name] stated that she needs to be able to pick this RX up and be back at the home that pt is in by 2 pm this afternoon. I advised I would send the message and she stated that this was ridiculous b/c she called at 11:30 am yesterday.

## 2015-02-17 NOTE — Telephone Encounter (Signed)
Becky called to see if medication was ready. I advised that Dr. Rosanna Randy wouldn't be here until later today. She asked if it usually takes this long and that pt was here about 2 weeks ago for the same reason and that it should not take this long b/c Dr. Rosanna Randy was aware of the issue. Thanks TNP

## 2015-02-22 ENCOUNTER — Telehealth: Payer: Self-pay | Admitting: Family Medicine

## 2015-02-22 NOTE — Telephone Encounter (Signed)
Is this ok to do?-aa 

## 2015-02-22 NOTE — Telephone Encounter (Signed)
Needs DC for tramadol 50mg .  Fax to [facility] [phone]  Thanks [name]

## 2015-02-22 NOTE — Telephone Encounter (Signed)
Ok to do this 

## 2015-02-22 NOTE — Telephone Encounter (Signed)
Done-aa 

## 2015-02-23 ENCOUNTER — Ambulatory Visit
Admission: RE | Admit: 2015-02-23 | Discharge: 2015-02-23 | Disposition: A | Discharge status: Home / Self Care | Payer: Medicare Other | Source: Ambulatory Visit | Attending: Family Medicine | Admitting: Family Medicine

## 2015-02-23 ENCOUNTER — Ambulatory Visit (INDEPENDENT_AMBULATORY_CARE_PROVIDER_SITE_OTHER): Payer: Medicare Other | Admitting: Family Medicine

## 2015-02-23 VITALS — BP 108/60 | HR 94 | Temp 98.1°F | Resp 16

## 2015-02-23 DIAGNOSIS — M2550 Pain in unspecified joint: Secondary | ICD-10-CM

## 2015-02-23 DIAGNOSIS — G808 Other cerebral palsy: Secondary | ICD-10-CM

## 2015-02-23 DIAGNOSIS — F79 Unspecified intellectual disabilities: Secondary | ICD-10-CM | POA: Diagnosis not present

## 2015-02-23 DIAGNOSIS — J189 Pneumonia, unspecified organism: Secondary | ICD-10-CM | POA: Insufficient documentation

## 2015-02-23 DIAGNOSIS — E669 Obesity, unspecified: Secondary | ICD-10-CM

## 2015-02-23 DIAGNOSIS — I517 Cardiomegaly: Secondary | ICD-10-CM | POA: Insufficient documentation

## 2015-02-23 DIAGNOSIS — R05 Cough: Secondary | ICD-10-CM | POA: Diagnosis not present

## 2015-02-23 DIAGNOSIS — R059 Cough, unspecified: Secondary | ICD-10-CM

## 2015-02-23 MED ORDER — AZITHROMYCIN 250 MG PO TABS: ORAL_TABLET | ORAL | Status: DC

## 2015-02-23 NOTE — Progress Notes (Signed)
Patient ID: Helen Patton, female   DOB: 23-Aug-1960, 55 y.o.   MRN: [id number]    Subjective:  HPI  Patient developed a cough yesterday January 11th or January 10th at the earliest but caregiver did not find out until yesterday evening. Have not been able to see the color of the phlegm she is coughing up, some sore throat, some shortness of breath, she is sweating. Caregiver also mentions that patient's blood pressure was low yesterday.  Prior to Admission medications   Medication Sig Start Date End Date Taking? Authorizing Provider  alendronate (FOSAMAX) 70 MG tablet 1 TAB PO WEEKLY EARLY AM BEFORE FOOD/MEDS W/WATER. DON'T LIE DOWN FOR30 MIN (OSTEOPOROSIS) *NO CRUSH* 08/03/14  Yes Richard Maceo Pro., MD  BENZOYL PEROXIDE 5 % external wash USE TO [facility] AFFECTED AREA DAILY 10/24/14  Yes Richard Maceo Pro., MD  calcium-vitamin D (OSCAL WITH D) 500-200 MG-UNIT per tablet Take 1 tablet by mouth daily with breakfast. 08/03/14  Yes Jerrol Banana., MD  clindamycin (CLEOCIN T) 1 % external solution Apply topically daily. 10/04/14  Yes Richard Maceo Pro., MD  docusate sodium (COLACE) 100 MG capsule Take 100 mg by mouth 2 (two) times daily.   Yes Historical Provider, MD  DULoxetine (CYMBALTA) 60 MG capsule Take 60 mg by mouth daily.   Yes Historical Provider, MD  HYDROcodone-acetaminophen (NORCO/VICODIN) 5-325 MG tablet Take 1 tablet by mouth every 6 (six) hours as needed for moderate pain. 02/17/15  Yes Richard Maceo Pro., MD  lamoTRIgine (LAMICTAL) 100 MG tablet TAKE 3 TABLETS (300MG ) BY MOUTH 2 TIMES A DAY 11/10/14  Yes Historical Provider, MD  lamoTRIgine (LAMICTAL) 150 MG tablet Take 300 mg by mouth 2 (two) times daily.    Yes Historical Provider, MD  letrozole (FEMARA) 2.5 MG tablet Take 2.5 mg by mouth daily.   Yes Historical Provider, MD  loratadine (CLARITIN) 10 MG tablet Take 10 mg by mouth daily.   Yes Historical Provider, MD  LORazepam (ATIVAN) 1 MG tablet Take 1 mg by mouth  every 8 (eight) hours as needed for anxiety.   Yes Historical Provider, MD  losartan (COZAAR) 100 MG tablet Take 100 mg by mouth daily.   Yes Historical Provider, MD  omeprazole (PRILOSEC) 20 MG capsule TAKE 1 CAPSULE BY MOUTH TWICE DAILY FOR GERD. (DO NOT CRUSH) 12/28/14  Yes Jerrol Banana., MD  terbinafine (LAMISIL) 1 % cream As directed previously 11/11/14  Yes Richard Maceo Pro., MD  traMADol (ULTRAM) 50 MG tablet Take 1 tablet (50 mg total) by mouth every 6 (six) hours as needed. 02/01/15  Yes Richard Maceo Pro., MD    Patient Active Problem List   Diagnosis Date Noted  . Anxiety 12/14/2014  . Arthralgia of hip 12/14/2014  . Athlete's foot 12/14/2014  . Cerebral palsy ([place]) 12/14/2014  . Cognitive decline 12/14/2014  . Clinical depression 12/14/2014  . Acid reflux 12/14/2014  . HLD (hyperlipidemia) 12/14/2014  . BP (high blood pressure) 12/14/2014  . Methicillin resistant Staphylococcus aureus infection 12/14/2014  . Adiposity 12/14/2014  . Breast cancer ([place]) 11/21/2013  . Mental retardation 11/16/2013  . Seizure ([place]) 09/30/2013    Past Medical History  Diagnosis Date  . Mental retardation   . GERD (gastroesophageal reflux disease)   . Osteoporosis   . Hypertension   . Seizures ([place])   . MRSA (methicillin resistant staph aureus) culture positive 2012  . Hidradenitis 2012    MRSA, on chronic topical suppression  .  Cancer [facility]) Oct 2015    invasive mammary carcinoma  . Breast cancer ([place]) 2015    [name]  . Falls     Social History   Social History  . Marital Status: Single    Spouse Name: N/A  . Number of Children: N/A  . Years of Education: N/A   Occupational History  . Not on file.   Social History Main Topics  . Smoking status: Never Smoker   . Smokeless tobacco: Never Used  . Alcohol Use: No  . Drug Use: No  . Sexual Activity: Not on file   Other Topics Concern  . Not on file   Social History Narrative    Allergies    Allergen Reactions  . Sulfa Antibiotics   . Keflex [Cephalexin] Other (See Comments)    unknown    Review of Systems  Constitutional: Positive for malaise/fatigue.  Respiratory: Positive for cough and shortness of breath.   Cardiovascular: Negative.   Musculoskeletal: Positive for myalgias, joint pain and falls.  Neurological: Positive for weakness.  Endo/Heme/Allergies: Negative.   Psychiatric/Behavioral: Negative.     Immunization History  Administered Date(s) Administered  . Influenza,inj,Quad PF,36+ Mos 10/29/2014   Objective:  BP 108/60 mmHg  Pulse 94  Temp(Src) 98.1 F (36.7 C)  Resp 16  Wt   SpO2 90%  Physical Exam  Constitutional: She is well-developed, well-nourished, and in no distress.  HENT:  Head: Normocephalic and atraumatic.  Mouth/Throat: Oropharynx is clear and moist.  Eyes: Conjunctivae are normal. Pupils are equal, round, and reactive to light.  Neck: Normal range of motion. Neck supple.  Cardiovascular: Normal rate, regular rhythm, normal heart sounds and intact distal pulses.   No murmur heard. Pulmonary/Chest: Effort normal and breath sounds normal. No respiratory distress. She has no wheezes.    Lab Results  Component Value Date   WBC 9.9 09/26/2014   HGB 12.2 09/26/2014   HCT 37.5 09/26/2014   PLT 339 09/26/2014   GLUCOSE 118* 09/26/2014   TSH 2.55 12/28/2011    CMP     Component Value Date/Time   NA 139 09/26/2014 1545   NA 146 06/17/2013   NA 136 01/08/2013 1742   K 4.3 09/26/2014 1545   K 4.0 01/08/2013 1742   CL 98* 09/26/2014 1545   CL 102 01/08/2013 1742   CO2 32 09/26/2014 1545   CO2 32 01/08/2013 1742   GLUCOSE 118* 09/26/2014 1545   GLUCOSE 122* 01/08/2013 1742   BUN 24* 09/26/2014 1545   BUN 13 06/17/2013   BUN 21* 01/08/2013 1742   CREATININE 0.87 09/26/2014 1545   CREATININE 0.6 06/17/2013   CREATININE 0.74 01/08/2013 1742   CALCIUM 9.4 09/26/2014 1545   CALCIUM 9.6 01/08/2013 1742   PROT 7.9 09/26/2014  1545   PROT 7.3 01/08/2013 1742   ALBUMIN 4.5 09/26/2014 1545   ALBUMIN 4.1 01/08/2013 1742   AST 24 09/26/2014 1545   AST 39* 01/08/2013 1742   ALT 16 09/26/2014 1545   ALT 50 01/08/2013 1742   ALKPHOS 79 09/26/2014 1545   ALKPHOS 108 01/08/2013 1742   BILITOT 0.6 09/26/2014 1545   BILITOT 0.4 01/08/2013 1742   GFRNONAA >60 09/26/2014 1545   GFRNONAA >60 01/08/2013 1742   GFRAA >60 09/26/2014 1545   GFRAA >60 01/08/2013 1742    Assessment and Plan :  1. Cough New. Will get Chest Xray. Most likely viral but will provide RX due to the poor communication with patient.  2. Mental  retardation/microcephaly  3. Joint pain/OA Stable on Norco treatment at this time. She has appointment with [name] next week 4. Other cerebral palsy ([place])  5. Adiposity    Miguel Aschoff MD [facility] 02/23/2015 10:59 AM

## 2015-02-24 ENCOUNTER — Telehealth: Payer: Self-pay

## 2015-02-24 NOTE — Telephone Encounter (Signed)
Advised as below.  

## 2015-02-24 NOTE — Telephone Encounter (Signed)
Left message for Becky to call back. 

## 2015-02-24 NOTE — Telephone Encounter (Signed)
-----   Message from Jerrol Banana., MD sent at 02/23/2015  3:47 PM EST ----- Pneumonia is present--take antibiotics--plase attempt to take temperature daily for the next week--if it spikes above 101 and stays there or if pt becomes short of breath let us know. Otherwise see [place] back 4-6 weeks for f/u of pneumonia.

## 2015-02-28 ENCOUNTER — Telehealth: Payer: Self-pay | Admitting: Family Medicine

## 2015-02-28 DIAGNOSIS — J189 Pneumonia, unspecified organism: Secondary | ICD-10-CM

## 2015-02-28 DIAGNOSIS — M87051 Idiopathic aseptic necrosis of right femur: Secondary | ICD-10-CM | POA: Diagnosis not present

## 2015-02-28 DIAGNOSIS — M25552 Pain in left hip: Secondary | ICD-10-CM | POA: Diagnosis not present

## 2015-02-28 DIAGNOSIS — M25551 Pain in right hip: Secondary | ICD-10-CM | POA: Diagnosis not present

## 2015-02-28 DIAGNOSIS — J181 Lobar pneumonia, unspecified organism: Principal | ICD-10-CM

## 2015-02-28 NOTE — Telephone Encounter (Signed)
Becky pt caregiver called stating pt completed the Rx yesterday.  Pt still has a cough.  Does pt need to be seen again or does she need to wait a few days for cough to get better.  Pharm care.  [id number]

## 2015-02-28 NOTE — Telephone Encounter (Signed)
It'll probably be a week or 2 before the cough starts to improve.

## 2015-03-01 ENCOUNTER — Telehealth: Payer: Self-pay | Admitting: Emergency Medicine

## 2015-03-01 MED ORDER — PREDNISONE 10 MG (21) PO TBPK: 10.0000 mg | ORAL_TABLET | Freq: Every day | ORAL | Status: DC

## 2015-03-01 NOTE — Telephone Encounter (Signed)
LMTCB. I have not signed off on rx, yet since I have not talked to caregiver.

## 2015-03-01 NOTE — Telephone Encounter (Signed)
Okay to write note for work. 

## 2015-03-01 NOTE — Telephone Encounter (Signed)
Patient's caregiver advised as directed below. RX sent to pharmacy.

## 2015-03-01 NOTE — Telephone Encounter (Signed)
Caregiver called and wants an extended note for pt not to go to "work" until she gets over this pneumonia. So they want a note faxed stating how long she needs to be out of "work". Also [name] does not want her baring any weight on her hip right now unless absolutely necessary, so they are working on finding her a wheel chair. I can write the note and have you sign it if you just let me know how long you want her to be out. Thanks.   Fax number- [phone]

## 2015-03-01 NOTE — Telephone Encounter (Signed)
Note faxed to caregiver.

## 2015-03-01 NOTE — Telephone Encounter (Signed)
Spoke with caregiver. Informed her of below. Wanted to let you know that pt saw [name] yesterday and she will need her hip replaced. Caregiver said that [name] said he would get with you to see if there was anything else to do to get her better faster, because she is in a lot of pain and the sooner her hip is replaced the better.

## 2015-03-01 NOTE — Telephone Encounter (Signed)
Prednisone 10 mg, 6 day taper.

## 2015-03-07 ENCOUNTER — Telehealth: Payer: Self-pay | Admitting: Family Medicine

## 2015-03-07 DIAGNOSIS — S72001A Fracture of unspecified part of neck of right femur, initial encounter for closed fracture: Secondary | ICD-10-CM | POA: Insufficient documentation

## 2015-03-07 DIAGNOSIS — M87059 Idiopathic aseptic necrosis of unspecified femur: Secondary | ICD-10-CM | POA: Insufficient documentation

## 2015-03-07 NOTE — Telephone Encounter (Signed)
She should be seen 1 month after last OV

## 2015-03-07 NOTE — Telephone Encounter (Signed)
Please review-aa 

## 2015-03-07 NOTE — Telephone Encounter (Signed)
Pt's caregiver [name] stated pt finished the Prednisone today 03/07/15 and would like to know when she should make pt a f/u appt with Dr. Rosanna Randy for a chest xray or what does she need to do about getting pt ready for surgery clearance. [name] stated that pt has improved a lot since her last visit. Please advise. Thanks TNP

## 2015-03-08 NOTE — Telephone Encounter (Signed)
Helen Patton advised as below, appt will be scheduled.

## 2015-03-14 ENCOUNTER — Telehealth: Payer: Self-pay | Admitting: Family Medicine

## 2015-03-14 NOTE — Telephone Encounter (Signed)
Pt's caregiver [name] would like an order for Chucks faxed to [phone] for a 30 day supply of 120 per month. Please advise. Thanks TNP

## 2015-03-14 NOTE — Telephone Encounter (Signed)
Order done and faxed-aa

## 2015-03-14 NOTE — Telephone Encounter (Signed)
Please review-aa 

## 2015-03-14 NOTE — Telephone Encounter (Signed)
ok 

## 2015-03-21 DIAGNOSIS — F39 Unspecified mood [affective] disorder: Secondary | ICD-10-CM | POA: Diagnosis not present

## 2015-03-27 ENCOUNTER — Ambulatory Visit
Admission: RE | Admit: 2015-03-27 | Discharge: 2015-03-27 | Disposition: A | Discharge status: Home / Self Care | Payer: Medicare Other | Source: Ambulatory Visit | Attending: Family Medicine | Admitting: Family Medicine

## 2015-03-27 ENCOUNTER — Ambulatory Visit (INDEPENDENT_AMBULATORY_CARE_PROVIDER_SITE_OTHER): Payer: Medicare Other | Admitting: Family Medicine

## 2015-03-27 VITALS — BP 138/74 | HR 92 | Temp 98.1°F | Resp 16

## 2015-03-27 DIAGNOSIS — J189 Pneumonia, unspecified organism: Secondary | ICD-10-CM | POA: Diagnosis not present

## 2015-03-27 DIAGNOSIS — J986 Disorders of diaphragm: Secondary | ICD-10-CM | POA: Diagnosis not present

## 2015-03-27 DIAGNOSIS — J181 Lobar pneumonia, unspecified organism: Principal | ICD-10-CM

## 2015-03-27 NOTE — Progress Notes (Signed)
Patient ID: Helen Patton, female   DOB: [birth year], 55 y.o.   MRN: [id number]   Helen Patton  MRN: [id number] DOB: May 12, 1960  Subjective:  HPI   1. Right lower lobe pneumonia The patient is a 55 year old female who presents for follow up of her pneumonia.  She is with her mother and care taker.  They report she is doing much better and no longer has any symptoms of cough or shortness of breath.  Patient Active Problem List   Diagnosis Date Noted  . Anxiety 12/14/2014  . Arthralgia of hip 12/14/2014  . Athlete's foot 12/14/2014  . Cerebral palsy ([place]) 12/14/2014  . Cognitive decline 12/14/2014  . Clinical depression 12/14/2014  . Acid reflux 12/14/2014  . HLD (hyperlipidemia) 12/14/2014  . BP (high blood pressure) 12/14/2014  . Methicillin resistant Staphylococcus aureus infection 12/14/2014  . Adiposity 12/14/2014  . Breast cancer ([place]) 11/21/2013  . Mental retardation 11/16/2013  . Seizure ([place]) 09/30/2013    Past Medical History  Diagnosis Date  . Mental retardation   . GERD (gastroesophageal reflux disease)   . Osteoporosis   . Hypertension   . Seizures ([place])   . MRSA (methicillin resistant staph aureus) culture positive 2012  . Hidradenitis 2012    MRSA, on chronic topical suppression  . Cancer [facility]) Oct 2015    invasive mammary carcinoma  . Breast cancer ([place]) 2015    [name]  . [place]     Social History   Social History  . Marital Status: Single    Spouse Name: N/A  . Number of Children: N/A  . Years of Education: N/A   Occupational History  . Not on file.   Social History Main Topics  . Smoking status: Never Smoker   . Smokeless tobacco: Never Used  . Alcohol Use: No  . Drug Use: No  . Sexual Activity: Not on file   Other Topics Concern  . Not on file   Social History Narrative    Outpatient Prescriptions Prior to Visit  Medication Sig Dispense Refill  . alendronate (FOSAMAX) 70 MG tablet 1 TAB PO WEEKLY EARLY  AM BEFORE FOOD/MEDS W/WATER. DON'T LIE DOWN FOR30 MIN (OSTEOPOROSIS) *NO CRUSH* 5 tablet 12  . BENZOYL PEROXIDE 5 % external wash USE TO WASH AFFECTED AREA DAILY 237 g 0  . calcium-vitamin D (OSCAL WITH D) 500-200 MG-UNIT per tablet Take 1 tablet by mouth daily with breakfast. 30 tablet 12  . clindamycin (CLEOCIN T) 1 % external solution Apply topically daily. 60 mL 12  . docusate sodium (COLACE) 100 MG capsule Take 100 mg by mouth 2 (two) times daily.    . DULoxetine (CYMBALTA) 60 MG capsule Take 60 mg by mouth daily.    Marland Kitchen HYDROcodone-acetaminophen (NORCO/VICODIN) 5-325 MG tablet Take 1 tablet by mouth every 6 (six) hours as needed for moderate pain. 100 tablet 0  . lamoTRIgine (LAMICTAL) 150 MG tablet Take 300 mg by mouth 2 (two) times daily.     Marland Kitchen letrozole (FEMARA) 2.5 MG tablet Take 2.5 mg by mouth daily.    Marland Kitchen loratadine (CLARITIN) 10 MG tablet Take 10 mg by mouth daily.    Marland Kitchen LORazepam (ATIVAN) 1 MG tablet Take 1 mg by mouth every 8 (eight) hours as needed for anxiety.    Marland Kitchen losartan (COZAAR) 100 MG tablet Take 100 mg by mouth daily.    Marland Kitchen omeprazole (PRILOSEC) 20 MG capsule TAKE 1 CAPSULE BY MOUTH TWICE DAILY FOR GERD. (DO  NOT CRUSH) 60 capsule 12  . terbinafine (LAMISIL) 1 % cream As directed previously 30 g 0  . traMADol (ULTRAM) 50 MG tablet Take 1 tablet (50 mg total) by mouth every 6 (six) hours as needed. 120 tablet 5  . azithromycin (ZITHROMAX) 250 MG tablet As directed 6 tablet 0  . lamoTRIgine (LAMICTAL) 100 MG tablet TAKE 3 TABLETS (300MG ) BY MOUTH 2 TIMES A DAY    . predniSONE (STERAPRED UNI-PAK 21 TAB) 10 MG (21) TBPK tablet Take 1 tablet (10 mg total) by mouth daily. 21 tablet 0   No facility-administered medications prior to visit.   Outpatient Encounter Prescriptions as of 03/27/2015  Medication Sig Note  . alendronate (FOSAMAX) 70 MG tablet 1 TAB PO WEEKLY EARLY AM BEFORE FOOD/MEDS W/WATER. DON'T LIE DOWN FOR30 MIN (OSTEOPOROSIS) *NO CRUSH*   . BENZOYL PEROXIDE 5 % external  wash USE TO [facility] AFFECTED AREA DAILY   . calcium-vitamin D (OSCAL WITH D) 500-200 MG-UNIT per tablet Take 1 tablet by mouth daily with breakfast.   . clindamycin (CLEOCIN T) 1 % external solution Apply topically daily.   Marland Kitchen docusate sodium (COLACE) 100 MG capsule Take 100 mg by mouth 2 (two) times daily.   . DULoxetine (CYMBALTA) 60 MG capsule Take 60 mg by mouth daily.   Marland Kitchen HYDROcodone-acetaminophen (NORCO/VICODIN) 5-325 MG tablet Take 1 tablet by mouth every 6 (six) hours as needed for moderate pain.   Marland Kitchen lamoTRIgine (LAMICTAL) 150 MG tablet Take 300 mg by mouth 2 (two) times daily.    Marland Kitchen letrozole (FEMARA) 2.5 MG tablet Take 2.5 mg by mouth daily.   Marland Kitchen loratadine (CLARITIN) 10 MG tablet Take 10 mg by mouth daily.   Marland Kitchen LORazepam (ATIVAN) 1 MG tablet Take 1 mg by mouth every 8 (eight) hours as needed for anxiety.   Marland Kitchen losartan (COZAAR) 100 MG tablet Take 100 mg by mouth daily.   Marland Kitchen omeprazole (PRILOSEC) 20 MG capsule TAKE 1 CAPSULE BY MOUTH TWICE DAILY FOR GERD. (DO NOT CRUSH)   . terbinafine (LAMISIL) 1 % cream As directed previously   . traMADol (ULTRAM) 50 MG tablet Take 1 tablet (50 mg total) by mouth every 6 (six) hours as needed.   . [DISCONTINUED] azithromycin (ZITHROMAX) 250 MG tablet As directed   . [DISCONTINUED] lamoTRIgine (LAMICTAL) 100 MG tablet TAKE 3 TABLETS (300MG ) BY MOUTH 2 TIMES A DAY 12/14/2014: Received from: [facility]  . [DISCONTINUED] predniSONE (STERAPRED UNI-PAK 21 TAB) 10 MG (21) TBPK tablet Take 1 tablet (10 mg total) by mouth daily.    No facility-administered encounter medications on file as of 03/27/2015.    Allergies  Allergen Reactions  . Sulfa Antibiotics Other (See Comments)    "Does not know"  . Keflex [Cephalexin] Other (See Comments)    "Does not know" unknown    Review of Systems  Constitutional: Negative for fever, chills, weight loss, malaise/fatigue and diaphoresis.  Eyes: Negative.   Respiratory: Negative for cough, hemoptysis,  sputum production, shortness of breath and wheezing.   Cardiovascular: Negative for chest pain, palpitations, orthopnea and leg swelling.  Gastrointestinal: Negative.   Neurological: Negative for dizziness, weakness and headaches.  Endo/Heme/Allergies: Negative.   Psychiatric/Behavioral: Negative.    Objective:  BP 138/74 mmHg  Pulse 92  Temp(Src) 98.1 F (36.7 C) (Oral)  Resp 16  Physical Exam  Constitutional: She is well-developed, well-nourished, and in no distress.  HENT:  Head: Atraumatic.  Right Ear: External ear normal.  Left Ear: External ear normal.  Nose: Nose normal.  Eyes: Pupils are equal, round, and reactive to light.  Neck: Normal range of motion. Neck supple.  Cardiovascular: Normal rate, regular rhythm and normal heart sounds.   Pulmonary/Chest: Effort normal and breath sounds normal.  Abdominal: Soft.  Neurological: She is alert.  Skin: Skin is warm and dry.  Psychiatric: Mood, memory, affect and judgment normal.    Assessment and Plan :   1. Right lower lobe pneumonia If x-ray is clear patient will be ok for her surgery, if she still has some pneumonia we will repeat the x-ray in another couple weeks. 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 3.Hip OA If CXR cleared then pt medically cleared for THR with Dr Marry Guan. Miguel Aschoff MD [facility] 03/27/2015 10:41 AM

## 2015-03-29 ENCOUNTER — Telehealth: Payer: Self-pay

## 2015-03-29 NOTE — Telephone Encounter (Signed)
Just wanted to make sure that you would speak to [name] to let him know pneumonia has cleared. So patient can proceed with surgery. Do i just need to do that or did you want to talk to him about anything else in regards to the patient?-aa

## 2015-03-30 NOTE — Telephone Encounter (Signed)
Please let Dr Maree Krabbe Julianne Handler she is cleared for surgery.

## 2015-03-30 NOTE — Telephone Encounter (Signed)
Advised receptionist and she sent message to the doctor with the message below-aa

## 2015-04-05 ENCOUNTER — Other Ambulatory Visit: Payer: Self-pay

## 2015-04-05 MED ORDER — DOCUSATE SODIUM 100 MG PO CAPS: 100.0000 mg | ORAL_CAPSULE | Freq: Two times a day (BID) | ORAL | Status: DC

## 2015-04-05 MED ORDER — AMLODIPINE BESYLATE 10 MG PO TABS: 10.0000 mg | ORAL_TABLET | Freq: Every day | ORAL | Status: DC

## 2015-04-05 MED ORDER — DULOXETINE HCL 60 MG PO CPEP: 60.0000 mg | ORAL_CAPSULE | Freq: Every day | ORAL | Status: DC

## 2015-04-05 MED ORDER — LOSARTAN POTASSIUM 100 MG PO TABS: 100.0000 mg | ORAL_TABLET | Freq: Every day | ORAL | Status: DC

## 2015-04-05 MED ORDER — LORATADINE 10 MG PO TABS: 10.0000 mg | ORAL_TABLET | Freq: Every day | ORAL | Status: DC

## 2015-04-25 DIAGNOSIS — S72001S Fracture of unspecified part of neck of right femur, sequela: Secondary | ICD-10-CM | POA: Diagnosis not present

## 2015-04-25 DIAGNOSIS — M87051 Idiopathic aseptic necrosis of right femur: Secondary | ICD-10-CM | POA: Diagnosis not present

## 2015-04-25 DIAGNOSIS — M8700 Idiopathic aseptic necrosis of unspecified bone: Secondary | ICD-10-CM | POA: Diagnosis not present

## 2015-05-04 ENCOUNTER — Other Ambulatory Visit: Payer: Self-pay

## 2015-05-04 MED ORDER — TERBINAFINE HCL 1 % EX CREA: TOPICAL_CREAM | CUTANEOUS | Status: DC

## 2015-05-04 NOTE — Telephone Encounter (Signed)
Refill request received from [facility] to fill Terbinafine cream, please review-aa

## 2015-05-11 DIAGNOSIS — I10 Essential (primary) hypertension: Secondary | ICD-10-CM | POA: Diagnosis not present

## 2015-05-11 DIAGNOSIS — M810 Age-related osteoporosis without current pathological fracture: Secondary | ICD-10-CM | POA: Diagnosis not present

## 2015-05-11 DIAGNOSIS — E785 Hyperlipidemia, unspecified: Secondary | ICD-10-CM | POA: Diagnosis not present

## 2015-05-11 DIAGNOSIS — K219 Gastro-esophageal reflux disease without esophagitis: Secondary | ICD-10-CM | POA: Diagnosis not present

## 2015-05-11 DIAGNOSIS — Z7901 Long term (current) use of anticoagulants: Secondary | ICD-10-CM | POA: Diagnosis not present

## 2015-05-11 DIAGNOSIS — Z01812 Encounter for preprocedural laboratory examination: Secondary | ICD-10-CM | POA: Diagnosis not present

## 2015-05-11 DIAGNOSIS — F419 Anxiety disorder, unspecified: Secondary | ICD-10-CM | POA: Diagnosis not present

## 2015-05-11 DIAGNOSIS — R569 Unspecified convulsions: Secondary | ICD-10-CM | POA: Diagnosis not present

## 2015-05-11 DIAGNOSIS — Z79899 Other long term (current) drug therapy: Secondary | ICD-10-CM | POA: Diagnosis not present

## 2015-05-11 DIAGNOSIS — M25551 Pain in right hip: Secondary | ICD-10-CM | POA: Diagnosis not present

## 2015-05-11 DIAGNOSIS — F79 Unspecified intellectual disabilities: Secondary | ICD-10-CM | POA: Diagnosis not present

## 2015-05-11 LAB — BASIC METABOLIC PANEL
Anion gap: 7 (ref 5–15)
BUN: 18 mg/dL (ref 6–20)
CALCIUM: 9.6 mg/dL (ref 8.9–10.3)
CO2: 28 mmol/L (ref 22–32)
CREATININE: 0.57 mg/dL (ref 0.44–1.00)
Chloride: 102 mmol/L (ref 101–111)
GFR calc Af Amer: >60 mL/min (ref >60)
GFR calc non Af Amer: >60 mL/min (ref >60)
GLUCOSE: 90 mg/dL (ref 65–99)
Potassium: 3.9 mmol/L (ref 3.5–5.1)
Sodium: 137 mmol/L (ref 135–145)

## 2015-05-11 LAB — CBC
HCT: 36.5 % (ref 35.0–47.0)
Hemoglobin: 12.1 g/dL (ref 12.0–16.0)
MCH: 29.6 pg (ref 26.0–34.0)
MCHC: 33 g/dL (ref 32.0–36.0)
MCV: 89.9 fL (ref 80.0–100.0)
PLATELETS: 338 10*3/uL (ref 150–440)
RBC: 4.07 MIL/uL (ref 3.80–5.20)
RDW: 14.6 % — AB (ref 11.5–14.5)
WBC: 11.5 10*3/uL — AB (ref 3.6–11.0)

## 2015-05-11 LAB — APTT: APTT: 30 s (ref 24–36)

## 2015-05-11 LAB — SEDIMENTATION RATE: Sed Rate: 23 mm/hr (ref 0–30)

## 2015-05-11 LAB — TYPE AND SCREEN
ABO/RH(D): O POS
Antibody Screen: NEGATIVE

## 2015-05-11 LAB — PROTIME-INR
INR: 0.95
PROTHROMBIN TIME: 12.9 s (ref 11.4–15.0)

## 2015-05-11 LAB — SURGICAL PCR SCREEN
MRSA, PCR: NEGATIVE
Staphylococcus aureus: NEGATIVE

## 2015-05-11 LAB — ABO/RH: ABO/RH(D): O POS

## 2015-05-11 NOTE — Pre-Procedure Instructions (Signed)
Patient ID: Helen Patton, female DOB: [birth year], 55 y.o. MRN: [id number] Medical Clearance Dr. Marianna Fuss  MRN: [id number] DOB: 03/01/1960  Subjective:  HPI   1. Right lower lobe pneumonia The patient is a 55 year old female who presents for follow up of her pneumonia. She is with her mother and care taker. They report she is doing much better and no longer has any symptoms of cough or shortness of breath.  Patient Active Problem List   Diagnosis Date Noted  . Anxiety 12/14/2014  . Arthralgia of hip 12/14/2014  . Athlete's foot 12/14/2014  . Cerebral palsy ([place]) 12/14/2014  . Cognitive decline 12/14/2014  . Clinical depression 12/14/2014  . Acid reflux 12/14/2014  . HLD (hyperlipidemia) 12/14/2014  . BP (high blood pressure) 12/14/2014  . Methicillin resistant Staphylococcus aureus infection 12/14/2014  . Adiposity 12/14/2014  . Breast cancer ([place]) 11/21/2013  . Mental retardation 11/16/2013  . Seizure ([place]) 09/30/2013    Past Medical History  Diagnosis Date  . Mental retardation   . GERD (gastroesophageal reflux disease)   . Osteoporosis   . Hypertension   . Seizures ([place])   . MRSA (methicillin resistant staph aureus) culture positive 2012  . Hidradenitis 2012    MRSA, on chronic topical suppression  . Cancer [facility]) Oct 2015    invasive mammary carcinoma  . Breast cancer ([place]) 2015    [name]  . [place]     Social History   Social History  . Marital Status: Single    Spouse Name: N/A  . Number of Children: N/A  . Years of Education: N/A   Occupational History  . Not on file.   Social History Main Topics  . Smoking status: Never Smoker   . Smokeless tobacco: Never Used  . Alcohol Use: No  . Drug Use: No  . Sexual Activity: Not on file   Other Topics Concern  . Not  on file   Social History Narrative    Outpatient Prescriptions Prior to Visit  Medication Sig Dispense Refill  . alendronate (FOSAMAX) 70 MG tablet 1 TAB PO WEEKLY EARLY AM BEFORE FOOD/MEDS W/WATER. DON'T LIE DOWN FOR30 MIN (OSTEOPOROSIS) *NO CRUSH* 5 tablet 12  . BENZOYL PEROXIDE 5 % external wash USE TO WASH AFFECTED AREA DAILY 237 g 0  . calcium-vitamin D (OSCAL WITH D) 500-200 MG-UNIT per tablet Take 1 tablet by mouth daily with breakfast. 30 tablet 12  . clindamycin (CLEOCIN T) 1 % external solution Apply topically daily. 60 mL 12  . docusate sodium (COLACE) 100 MG capsule Take 100 mg by mouth 2 (two) times daily.    . DULoxetine (CYMBALTA) 60 MG capsule Take 60 mg by mouth daily.    Marland Kitchen HYDROcodone-acetaminophen (NORCO/VICODIN) 5-325 MG tablet Take 1 tablet by mouth every 6 (six) hours as needed for moderate pain. 100 tablet 0  . lamoTRIgine (LAMICTAL) 150 MG tablet Take 300 mg by mouth 2 (two) times daily.     Marland Kitchen letrozole (FEMARA) 2.5 MG tablet Take 2.5 mg by mouth daily.    Marland Kitchen loratadine (CLARITIN) 10 MG tablet Take 10 mg by mouth daily.    Marland Kitchen LORazepam (ATIVAN) 1 MG tablet Take 1 mg by mouth every 8 (eight) hours as needed for anxiety.    Marland Kitchen losartan (COZAAR) 100 MG tablet Take 100 mg by mouth daily.    Marland Kitchen omeprazole (PRILOSEC) 20 MG capsule TAKE 1 CAPSULE BY MOUTH TWICE DAILY FOR GERD. (DO NOT CRUSH) 60 capsule  12  . terbinafine (LAMISIL) 1 % cream As directed previously 30 g 0  . traMADol (ULTRAM) 50 MG tablet Take 1 tablet (50 mg total) by mouth every 6 (six) hours as needed. 120 tablet 5  . azithromycin (ZITHROMAX) 250 MG tablet As directed 6 tablet 0  . lamoTRIgine (LAMICTAL) 100 MG tablet TAKE 3 TABLETS (300MG ) BY MOUTH 2 TIMES A DAY    . predniSONE (STERAPRED UNI-PAK 21 TAB) 10 MG (21) TBPK tablet Take 1 tablet (10 mg total) by mouth daily. 21 tablet 0   No facility-administered  medications prior to visit.   Outpatient Encounter Prescriptions as of 03/27/2015  Medication Sig Note  . alendronate (FOSAMAX) 70 MG tablet 1 TAB PO WEEKLY EARLY AM BEFORE FOOD/MEDS W/WATER. DON'T LIE DOWN FOR30 MIN (OSTEOPOROSIS) *NO CRUSH*   . BENZOYL PEROXIDE 5 % external wash USE TO [facility] AFFECTED AREA DAILY   . calcium-vitamin D (OSCAL WITH D) 500-200 MG-UNIT per tablet Take 1 tablet by mouth daily with breakfast.   . clindamycin (CLEOCIN T) 1 % external solution Apply topically daily.   Marland Kitchen docusate sodium (COLACE) 100 MG capsule Take 100 mg by mouth 2 (two) times daily.   . DULoxetine (CYMBALTA) 60 MG capsule Take 60 mg by mouth daily.   Marland Kitchen HYDROcodone-acetaminophen (NORCO/VICODIN) 5-325 MG tablet Take 1 tablet by mouth every 6 (six) hours as needed for moderate pain.   Marland Kitchen lamoTRIgine (LAMICTAL) 150 MG tablet Take 300 mg by mouth 2 (two) times daily.    Marland Kitchen letrozole (FEMARA) 2.5 MG tablet Take 2.5 mg by mouth daily.   Marland Kitchen loratadine (CLARITIN) 10 MG tablet Take 10 mg by mouth daily.   Marland Kitchen LORazepam (ATIVAN) 1 MG tablet Take 1 mg by mouth every 8 (eight) hours as needed for anxiety.   Marland Kitchen losartan (COZAAR) 100 MG tablet Take 100 mg by mouth daily.   Marland Kitchen omeprazole (PRILOSEC) 20 MG capsule TAKE 1 CAPSULE BY MOUTH TWICE DAILY FOR GERD. (DO NOT CRUSH)   . terbinafine (LAMISIL) 1 % cream As directed previously   . traMADol (ULTRAM) 50 MG tablet Take 1 tablet (50 mg total) by mouth every 6 (six) hours as needed.   . [DISCONTINUED] azithromycin (ZITHROMAX) 250 MG tablet As directed   . [DISCONTINUED] lamoTRIgine (LAMICTAL) 100 MG tablet TAKE 3 TABLETS (300MG ) BY MOUTH 2 TIMES A DAY 12/14/2014: Received from: [facility]  . [DISCONTINUED] predniSONE (STERAPRED UNI-PAK 21 TAB) 10 MG (21) TBPK tablet Take 1 tablet (10 mg total) by mouth daily.    No facility-administered encounter medications on file as of  03/27/2015.    Allergies  Allergen Reactions  . Sulfa Antibiotics Other (See Comments)    "Does not know"  . Keflex [Cephalexin] Other (See Comments)    "Does not know" unknown    Review of Systems  Constitutional: Negative for fever, chills, weight loss, malaise/fatigue and diaphoresis.  Eyes: Negative.  Respiratory: Negative for cough, hemoptysis, sputum production, shortness of breath and wheezing.  Cardiovascular: Negative for chest pain, palpitations, orthopnea and leg swelling.  Gastrointestinal: Negative.  Neurological: Negative for dizziness, weakness and headaches.  Endo/Heme/Allergies: Negative.  Psychiatric/Behavioral: Negative.   Objective:  BP 138/74 mmHg  Pulse 92  Temp(Src) 98.1 F (36.7 C) (Oral)  Resp 16  Physical Exam  Constitutional: She is well-developed, well-nourished, and in no distress.  HENT:  Head: Atraumatic.  Right Ear: External ear normal.  Left Ear: External ear normal.  Nose: Nose normal.  Eyes: Pupils are equal,  round, and reactive to light.  Neck: Normal range of motion. Neck supple.  Cardiovascular: Normal rate, regular rhythm and normal heart sounds.  Pulmonary/Chest: Effort normal and breath sounds normal.  Abdominal: Soft.  Neurological: She is alert.  Skin: Skin is warm and dry.  Psychiatric: Mood, memory, affect and judgment normal.    Assessment and Plan :   1. Right lower lobe pneumonia If x-ray is clear patient will be ok for her surgery, if she still has some pneumonia we will repeat the x-ray in another couple weeks. 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 3.Hip OA If CXR cleared then pt medically cleared for THR with Dr Marry Guan. Miguel Aschoff MD [facility] 03/27/2015 10:41 AM          Links    Previous Version

## 2015-05-11 NOTE — Patient Instructions (Signed)
  Your procedure is scheduled on: 05/19/15 Report to Day Surgery. To find out your arrival time please call [phone] between 1PM - 3PM on 05/18/15.  Remember: Instructions that are not followed completely may result in serious medical risk, up to and including death, or upon the discretion of your surgeon and anesthesiologist your surgery may need to be rescheduled.    __x__ 1. Do not eat food or drink liquids after midnight. No gum chewing or hard candies.     __x__ 2. No Alcohol for 24 hours before or after surgery.   ____ 3. Bring all medications with you on the day of surgery if instructed.    __x__ 4. Notify your doctor if there is any change in your medical condition     (cold, fever, infections).     Do not wear jewelry, make-up, hairpins, clips or nail polish.  Do not wear lotions, powders, or perfumes. You may wear deodorant.  Do not shave 48 hours prior to surgery. Men may shave face and neck.  Do not bring valuables to the hospital.    [facility] is not responsible for any belongings or valuables.               Contacts, dentures or bridgework may not be worn into surgery.  Leave your suitcase in the car. After surgery it may be brought to your room.  For patients admitted to the hospital, discharge time is determined by your                treatment team.   Patients discharged the day of surgery will not be allowed to drive home.   Please read over the following fact sheets that you were given:   MRSA Information and Surgical Site Infection Prevention   ____ Take these medicines the morning of surgery with A SIP OF WATER:    1. larsartan  2. lamictal  3. omeprazole  4.amlodipine  5.Norco  6.Ativan if very anxious             7.cymbalta ____ Fleet Enema (as directed)   __x__ Use CHG Soap as directed  ____ Use inhalers on the day of surgery  ____ Stop metformin 2 days prior to surgery    ____ Take 1/2 of usual insulin dose the night before surgery and none  on the morning of surgery.   ____ Stop Coumadin/Plavix/aspirin on   __x__ Stop Anti-inflammatories on Mobic tomorrow   ____ Stop supplements until after surgery.    ____ Bring C-Pap to the hospital.

## 2015-05-12 ENCOUNTER — Encounter
Admission: RE | Admit: 2015-05-12 | Discharge: 2015-05-12 | Disposition: A | Discharge status: Home / Self Care | Payer: Medicare Other | Source: Ambulatory Visit | Attending: Orthopedic Surgery | Admitting: Orthopedic Surgery

## 2015-05-12 DIAGNOSIS — Z79899 Other long term (current) drug therapy: Secondary | ICD-10-CM | POA: Diagnosis not present

## 2015-05-12 DIAGNOSIS — F419 Anxiety disorder, unspecified: Secondary | ICD-10-CM | POA: Insufficient documentation

## 2015-05-12 DIAGNOSIS — M25551 Pain in right hip: Secondary | ICD-10-CM | POA: Diagnosis not present

## 2015-05-12 DIAGNOSIS — F79 Unspecified intellectual disabilities: Secondary | ICD-10-CM | POA: Insufficient documentation

## 2015-05-12 DIAGNOSIS — K219 Gastro-esophageal reflux disease without esophagitis: Secondary | ICD-10-CM | POA: Insufficient documentation

## 2015-05-12 DIAGNOSIS — Z01812 Encounter for preprocedural laboratory examination: Secondary | ICD-10-CM | POA: Diagnosis not present

## 2015-05-12 DIAGNOSIS — M810 Age-related osteoporosis without current pathological fracture: Secondary | ICD-10-CM | POA: Insufficient documentation

## 2015-05-12 DIAGNOSIS — I10 Essential (primary) hypertension: Secondary | ICD-10-CM | POA: Insufficient documentation

## 2015-05-12 DIAGNOSIS — Z7901 Long term (current) use of anticoagulants: Secondary | ICD-10-CM | POA: Insufficient documentation

## 2015-05-12 DIAGNOSIS — E785 Hyperlipidemia, unspecified: Secondary | ICD-10-CM | POA: Diagnosis not present

## 2015-05-12 DIAGNOSIS — R569 Unspecified convulsions: Secondary | ICD-10-CM | POA: Insufficient documentation

## 2015-05-12 LAB — URINALYSIS COMPLETE WITH MICROSCOPIC (ARMC ONLY)
BILIRUBIN URINE: NEGATIVE
Glucose, UA: NEGATIVE mg/dL
Ketones, ur: NEGATIVE mg/dL
Nitrite: NEGATIVE
PH: 7 (ref 5.0–8.0)
PROTEIN: NEGATIVE mg/dL
RBC / HPF: NONE SEEN RBC/hpf (ref 0–5)
Specific Gravity, Urine: 1.005 (ref 1.005–1.030)

## 2015-05-15 LAB — URINE CULTURE: Special Requests: NORMAL

## 2015-05-15 NOTE — Pre-Procedure Instructions (Signed)
Urine culture results multiple species present, suggest recollection faxed to Cindy/Elaine at Dr. Clydell Hakim office.

## 2015-05-18 ENCOUNTER — Encounter
Admission: RE | Admit: 2015-05-18 | Discharge: 2015-05-18 | Disposition: A | Discharge status: Home / Self Care | Payer: Medicare Other | Source: Ambulatory Visit | Attending: Internal Medicine | Admitting: Internal Medicine

## 2015-05-19 ENCOUNTER — Inpatient Hospital Stay
Admission: RE | Admit: 2015-05-19 | Discharge: 2015-05-19 | DRG: 177 | Disposition: A | Discharge status: Home / Self Care | Payer: Medicare Other | Source: Ambulatory Visit | Attending: Internal Medicine | Admitting: Internal Medicine

## 2015-05-19 ENCOUNTER — Encounter: Payer: Self-pay | Admitting: Anesthesiology

## 2015-05-19 ENCOUNTER — Inpatient Hospital Stay
Admission: EM | Admit: 2015-05-19 | Discharge: 2015-05-24 | Disposition: A | Discharge status: Other Acute Inpatient Hospital | Payer: Medicare Other | Source: Home / Self Care | Attending: Internal Medicine | Admitting: Internal Medicine

## 2015-05-19 ENCOUNTER — Encounter
Admission: RE | Disposition: A | Discharge status: Home / Self Care | Payer: Self-pay | Source: Ambulatory Visit | Attending: Orthopedic Surgery

## 2015-05-19 ENCOUNTER — Inpatient Hospital Stay: Payer: Medicare Other

## 2015-05-19 ENCOUNTER — Encounter: Payer: Self-pay | Admitting: Emergency Medicine

## 2015-05-19 DIAGNOSIS — R7301 Impaired fasting glucose: Secondary | ICD-10-CM | POA: Diagnosis present

## 2015-05-19 DIAGNOSIS — Z79899 Other long term (current) drug therapy: Secondary | ICD-10-CM

## 2015-05-19 DIAGNOSIS — Z8249 Family history of ischemic heart disease and other diseases of the circulatory system: Secondary | ICD-10-CM

## 2015-05-19 DIAGNOSIS — Z833 Family history of diabetes mellitus: Secondary | ICD-10-CM

## 2015-05-19 DIAGNOSIS — Z791 Long term (current) use of non-steroidal anti-inflammatories (NSAID): Secondary | ICD-10-CM

## 2015-05-19 DIAGNOSIS — K5900 Constipation, unspecified: Secondary | ICD-10-CM | POA: Diagnosis present

## 2015-05-19 DIAGNOSIS — J189 Pneumonia, unspecified organism: Secondary | ICD-10-CM | POA: Diagnosis not present

## 2015-05-19 DIAGNOSIS — F79 Unspecified intellectual disabilities: Secondary | ICD-10-CM | POA: Diagnosis present

## 2015-05-19 DIAGNOSIS — Z01818 Encounter for other preprocedural examination: Secondary | ICD-10-CM | POA: Diagnosis not present

## 2015-05-19 DIAGNOSIS — Z7983 Long term (current) use of bisphosphonates: Secondary | ICD-10-CM

## 2015-05-19 DIAGNOSIS — Z5309 Procedure and treatment not carried out because of other contraindication: Secondary | ICD-10-CM | POA: Diagnosis present

## 2015-05-19 DIAGNOSIS — Z853 Personal history of malignant neoplasm of breast: Secondary | ICD-10-CM

## 2015-05-19 DIAGNOSIS — I10 Essential (primary) hypertension: Secondary | ICD-10-CM | POA: Diagnosis present

## 2015-05-19 DIAGNOSIS — J9601 Acute respiratory failure with hypoxia: Secondary | ICD-10-CM | POA: Diagnosis present

## 2015-05-19 DIAGNOSIS — K219 Gastro-esophageal reflux disease without esophagitis: Secondary | ICD-10-CM | POA: Diagnosis present

## 2015-05-19 DIAGNOSIS — R0902 Hypoxemia: Secondary | ICD-10-CM

## 2015-05-19 DIAGNOSIS — Z79818 Long term (current) use of other agents affecting estrogen receptors and estrogen levels: Secondary | ICD-10-CM | POA: Diagnosis not present

## 2015-05-19 DIAGNOSIS — G40909 Epilepsy, unspecified, not intractable, without status epilepticus: Secondary | ICD-10-CM | POA: Diagnosis present

## 2015-05-19 DIAGNOSIS — R7981 Abnormal blood-gas level: Secondary | ICD-10-CM

## 2015-05-19 DIAGNOSIS — Q02 Microcephaly: Secondary | ICD-10-CM

## 2015-05-19 DIAGNOSIS — R918 Other nonspecific abnormal finding of lung field: Secondary | ICD-10-CM | POA: Diagnosis not present

## 2015-05-19 DIAGNOSIS — Z8261 Family history of arthritis: Secondary | ICD-10-CM | POA: Diagnosis not present

## 2015-05-19 DIAGNOSIS — M810 Age-related osteoporosis without current pathological fracture: Secondary | ICD-10-CM | POA: Diagnosis present

## 2015-05-19 DIAGNOSIS — F419 Anxiety disorder, unspecified: Secondary | ICD-10-CM | POA: Diagnosis present

## 2015-05-19 DIAGNOSIS — Z820 Family history of epilepsy and other diseases of the nervous system: Secondary | ICD-10-CM | POA: Diagnosis not present

## 2015-05-19 DIAGNOSIS — R0602 Shortness of breath: Secondary | ICD-10-CM | POA: Diagnosis not present

## 2015-05-19 DIAGNOSIS — Z803 Family history of malignant neoplasm of breast: Secondary | ICD-10-CM

## 2015-05-19 DIAGNOSIS — J690 Pneumonitis due to inhalation of food and vomit: Principal | ICD-10-CM | POA: Diagnosis present

## 2015-05-19 DIAGNOSIS — M879 Osteonecrosis, unspecified: Secondary | ICD-10-CM | POA: Diagnosis present

## 2015-05-19 DIAGNOSIS — R569 Unspecified convulsions: Secondary | ICD-10-CM | POA: Diagnosis not present

## 2015-05-19 DIAGNOSIS — R05 Cough: Secondary | ICD-10-CM | POA: Diagnosis not present

## 2015-05-19 LAB — CBC WITH DIFFERENTIAL/PLATELET
BASOS ABS: 0.1 10*3/uL (ref 0–0.1)
BASOS PCT: 1 %
EOS PCT: 1 %
Eosinophils Absolute: 0.1 10*3/uL (ref 0–0.7)
HEMATOCRIT: 34.3 % — AB (ref 35.0–47.0)
Hemoglobin: 11.3 g/dL — ABNORMAL LOW (ref 12.0–16.0)
LYMPHS PCT: 8 %
Lymphs Abs: 1 10*3/uL (ref 1.0–3.6)
MCH: 29.5 pg (ref 26.0–34.0)
MCHC: 33.1 g/dL (ref 32.0–36.0)
MCV: 89.1 fL (ref 80.0–100.0)
MONO ABS: 1.5 10*3/uL — AB (ref 0.2–0.9)
Monocytes Relative: 12 %
NEUTROS ABS: 9.7 10*3/uL — AB (ref 1.4–6.5)
Neutrophils Relative %: 78 %
PLATELETS: 269 10*3/uL (ref 150–440)
RBC: 3.84 MIL/uL (ref 3.80–5.20)
RDW: 14.7 % — AB (ref 11.5–14.5)
WBC: 12.4 10*3/uL — AB (ref 3.6–11.0)

## 2015-05-19 LAB — BASIC METABOLIC PANEL
ANION GAP: 5 (ref 5–15)
BUN: 19 mg/dL (ref 6–20)
CO2: 30 mmol/L (ref 22–32)
Calcium: 8.9 mg/dL (ref 8.9–10.3)
Chloride: 100 mmol/L — ABNORMAL LOW (ref 101–111)
Creatinine, Ser: 0.56 mg/dL (ref 0.44–1.00)
Glucose, Bld: 123 mg/dL — ABNORMAL HIGH (ref 65–99)
POTASSIUM: 3.7 mmol/L (ref 3.5–5.1)
Sodium: 135 mmol/L (ref 135–145)

## 2015-05-19 SURGERY — ARTHROPLASTY, HIP, TOTAL,POSTERIOR APPROACH
Anesthesia: Choice | Laterality: Right

## 2015-05-19 MED ORDER — PIPERACILLIN-TAZOBACTAM 3.375 G IVPB
3.3750 g | Freq: Three times a day (TID) | INTRAVENOUS | Status: DC
  Administered 2015-05-19 – 2015-05-22 (×9): 3.375 g via INTRAVENOUS
  Filled 2015-05-19 (×11): qty 50

## 2015-05-19 MED ORDER — LEVOFLOXACIN IN D5W 750 MG/150ML IV SOLN
750.0000 mg | INTRAVENOUS | Status: DC
Start: 2015-05-20 — End: 2015-05-20
  Administered 2015-05-20: 10:00:00 750 mg via INTRAVENOUS
  Filled 2015-05-19: qty 150

## 2015-05-19 MED ORDER — SODIUM CHLORIDE 0.9% FLUSH
3.0000 mL | Freq: Two times a day (BID) | INTRAVENOUS | Status: DC
  Administered 2015-05-19 – 2015-05-24 (×10): 3 mL via INTRAVENOUS

## 2015-05-19 MED ORDER — AMLODIPINE BESYLATE 10 MG PO TABS
10.0000 mg | ORAL_TABLET | Freq: Every day | ORAL | Status: DC
  Administered 2015-05-20 – 2015-05-23 (×4): 10 mg via ORAL
  Filled 2015-05-19 (×6): qty 1

## 2015-05-19 MED ORDER — LORATADINE 10 MG PO TABS
10.0000 mg | ORAL_TABLET | Freq: Every day | ORAL | Status: DC
  Administered 2015-05-20 – 2015-05-24 (×5): 10 mg via ORAL
  Filled 2015-05-19 (×5): qty 1

## 2015-05-19 MED ORDER — LACTATED RINGERS IV SOLN
INTRAVENOUS | Status: DC
  Administered 2015-05-19: 07:00:00 via INTRAVENOUS

## 2015-05-19 MED ORDER — ACETAMINOPHEN 650 MG RE SUPP: 650.0000 mg | Freq: Four times a day (QID) | RECTAL | Status: DC | PRN

## 2015-05-19 MED ORDER — ACETAMINOPHEN 325 MG PO TABS: 650.0000 mg | ORAL_TABLET | Freq: Four times a day (QID) | ORAL | Status: DC | PRN

## 2015-05-19 MED ORDER — DULOXETINE HCL 60 MG PO CPEP
60.0000 mg | ORAL_CAPSULE | Freq: Every day | ORAL | Status: DC
  Administered 2015-05-20 – 2015-05-24 (×5): 60 mg via ORAL
  Filled 2015-05-19 (×5): qty 1

## 2015-05-19 MED ORDER — LOSARTAN POTASSIUM 50 MG PO TABS
100.0000 mg | ORAL_TABLET | Freq: Every day | ORAL | Status: DC
  Administered 2015-05-20 – 2015-05-24 (×5): 100 mg via ORAL
  Filled 2015-05-19 (×5): qty 2

## 2015-05-19 MED ORDER — TERBINAFINE HCL 1 % EX CREA
TOPICAL_CREAM | Freq: Two times a day (BID) | CUTANEOUS | Status: DC
  Administered 2015-05-19 – 2015-05-23 (×3): via TOPICAL
  Administered 2015-05-23: 1 via TOPICAL
  Filled 2015-05-19: qty 12

## 2015-05-19 MED ORDER — LETROZOLE 2.5 MG PO TABS
2.5000 mg | ORAL_TABLET | Freq: Every day | ORAL | Status: DC
  Administered 2015-05-20 – 2015-05-24 (×5): 2.5 mg via ORAL
  Filled 2015-05-19 (×5): qty 1

## 2015-05-19 MED ORDER — CLINDAMYCIN PHOSPHATE 900 MG/50ML IV SOLN: 900.0000 mg | INTRAVENOUS | Status: DC

## 2015-05-19 MED ORDER — HYDROCODONE-ACETAMINOPHEN 5-325 MG PO TABS
1.0000 | ORAL_TABLET | ORAL | Status: DC | PRN
  Administered 2015-05-19 – 2015-05-24 (×14): 1 via ORAL
  Filled 2015-05-19 (×15): qty 1

## 2015-05-19 MED ORDER — LORAZEPAM 1 MG PO TABS: 1.0000 mg | ORAL_TABLET | Freq: Three times a day (TID) | ORAL | Status: DC | PRN

## 2015-05-19 MED ORDER — DOCUSATE SODIUM 100 MG PO CAPS
100.0000 mg | ORAL_CAPSULE | Freq: Two times a day (BID) | ORAL | Status: DC
  Administered 2015-05-20 – 2015-05-24 (×9): 100 mg via ORAL
  Filled 2015-05-19 (×9): qty 1

## 2015-05-19 MED ORDER — PANTOPRAZOLE SODIUM 40 MG PO TBEC
40.0000 mg | DELAYED_RELEASE_TABLET | Freq: Every day | ORAL | Status: DC
  Administered 2015-05-19 – 2015-05-24 (×6): 40 mg via ORAL
  Filled 2015-05-19 (×6): qty 1

## 2015-05-19 MED ORDER — CHLORHEXIDINE GLUCONATE 4 % EX LIQD
60.0000 mL | Freq: Once | CUTANEOUS | Status: DC
Start: 2015-05-19 — End: 2015-05-19

## 2015-05-19 MED ORDER — TETRACAINE HCL 1 % IJ SOLN
INTRAMUSCULAR | Status: AC
  Filled 2015-05-19: qty 2

## 2015-05-19 MED ORDER — LEVOFLOXACIN IN D5W 750 MG/150ML IV SOLN
750.0000 mg | Freq: Once | INTRAVENOUS | Status: AC
  Administered 2015-05-19: 750 mg via INTRAVENOUS
  Filled 2015-05-19: qty 150

## 2015-05-19 MED ORDER — ENOXAPARIN SODIUM 40 MG/0.4ML ~~LOC~~ SOLN
40.0000 mg | SUBCUTANEOUS | Status: DC
  Administered 2015-05-19 – 2015-05-24 (×6): 40 mg via SUBCUTANEOUS
  Filled 2015-05-19 (×6): qty 0.4

## 2015-05-19 MED ORDER — VANCOMYCIN HCL IN DEXTROSE 1-5 GM/200ML-% IV SOLN: 1000.0000 mg | Freq: Once | INTRAVENOUS | Status: DC

## 2015-05-19 MED ORDER — LAMOTRIGINE 100 MG PO TABS
300.0000 mg | ORAL_TABLET | Freq: Two times a day (BID) | ORAL | Status: DC
  Administered 2015-05-19 – 2015-05-24 (×10): 300 mg via ORAL
  Filled 2015-05-19 (×6): qty 3
  Filled 2015-05-19: qty 2
  Filled 2015-05-19 (×4): qty 3

## 2015-05-19 MED ORDER — POVIDONE-IODINE 10 % EX SWAB: 2.0000 "application " | Freq: Once | CUTANEOUS | Status: DC

## 2015-05-19 MED ORDER — IPRATROPIUM-ALBUTEROL 0.5-2.5 (3) MG/3ML IN SOLN
3.0000 mL | Freq: Four times a day (QID) | RESPIRATORY_TRACT | Status: DC
  Administered 2015-05-19 – 2015-05-22 (×11): 3 mL via RESPIRATORY_TRACT
  Filled 2015-05-19 (×12): qty 3

## 2015-05-19 MED ORDER — TRANEXAMIC ACID 1000 MG/10ML IV SOLN
1000.0000 mg | INTRAVENOUS | Status: DC
  Filled 2015-05-19: qty 10

## 2015-05-19 MED ORDER — CLOTRIMAZOLE 1 % EX CREA
1.0000 "application " | TOPICAL_CREAM | Freq: Two times a day (BID) | CUTANEOUS | Status: DC
  Administered 2015-05-19 – 2015-05-23 (×4): 1 via TOPICAL
  Filled 2015-05-19: qty 15

## 2015-05-19 SURGICAL SUPPLY — 48 items
BLADE DRUM FLTD (BLADE) IMPLANT
BLADE SAW 1 (BLADE) IMPLANT
CANISTER SUCT 1200ML W/VALVE (MISCELLANEOUS) IMPLANT
CANISTER SUCT 3000ML (MISCELLANEOUS) IMPLANT
CARTRIDGE OIL MAESTRO DRILL (MISCELLANEOUS) IMPLANT
CATH FOL LEG HOLDER (MISCELLANEOUS) IMPLANT
CATH TRAY METER 16FR LF (MISCELLANEOUS) IMPLANT
DIFFUSER MAESTRO (MISCELLANEOUS) IMPLANT
DRAPE INCISE IOBAN 66X60 STRL (DRAPES) IMPLANT
DRAPE SHEET LG 3/4 BI-LAMINATE (DRAPES) IMPLANT
DRSG DERMACEA 8X12 NADH (GAUZE/BANDAGES/DRESSINGS) IMPLANT
DRSG OPSITE POSTOP 3X4 (GAUZE/BANDAGES/DRESSINGS) IMPLANT
DRSG OPSITE POSTOP 4X12 (GAUZE/BANDAGES/DRESSINGS) IMPLANT
DRSG OPSITE POSTOP 4X14 (GAUZE/BANDAGES/DRESSINGS) IMPLANT
DRSG TEGADERM 4X4.75 (GAUZE/BANDAGES/DRESSINGS) IMPLANT
DURAPREP 26ML APPLICATOR (WOUND CARE) IMPLANT
ELECT BLADE 6.5 EXT (BLADE) IMPLANT
ELECT CAUTERY BLADE 6.4 (BLADE) IMPLANT
GLOVE BIOGEL M STRL SZ7.5 (GLOVE) IMPLANT
GLOVE INDICATOR 8.0 STRL GRN (GLOVE) IMPLANT
GLOVE SURG 9.0 ORTHO LTXF (GLOVE) IMPLANT
GLOVE SURG ORTHO 9.0 STRL STRW (GLOVE) IMPLANT
GOWN STRL REUS W/ TWL LRG LVL3 (GOWN DISPOSABLE) IMPLANT
GOWN STRL REUS W/TWL 2XL LVL3 (GOWN DISPOSABLE) IMPLANT
GOWN STRL REUS W/TWL LRG LVL3 (GOWN DISPOSABLE)
HANDPIECE SUCTION TUBG SURGILV (MISCELLANEOUS) IMPLANT
HEMOVAC 400CC 10FR (MISCELLANEOUS) IMPLANT
HOOD PEEL AWAY FLYTE STAYCOOL (MISCELLANEOUS) IMPLANT
KIT RM TURNOVER STRD PROC AR (KITS) IMPLANT
NDL SAFETY 18GX1.5 (NEEDLE) IMPLANT
NS IRRIG 500ML POUR BTL (IV SOLUTION) IMPLANT
OIL CARTRIDGE MAESTRO DRILL (MISCELLANEOUS)
PACK HIP PROSTHESIS (MISCELLANEOUS) IMPLANT
SOL .9 NS 3000ML IRR  AL (IV SOLUTION)
SOL .9 NS 3000ML IRR UROMATIC (IV SOLUTION) IMPLANT
SOL PREP PVP 2OZ (MISCELLANEOUS)
SOLUTION PREP PVP 2OZ (MISCELLANEOUS) IMPLANT
SPONGE DRAIN TRACH 4X4 STRL 2S (GAUZE/BANDAGES/DRESSINGS) IMPLANT
STAPLER SKIN PROX 35W (STAPLE) IMPLANT
SUT ETHIBOND #5 BRAIDED 30INL (SUTURE) IMPLANT
SUT VIC AB 0 CT1 36 (SUTURE) IMPLANT
SUT VIC AB 1 CT1 36 (SUTURE) IMPLANT
SUT VIC AB 2-0 CT1 27 (SUTURE)
SUT VIC AB 2-0 CT1 TAPERPNT 27 (SUTURE) IMPLANT
SYR 20CC LL (SYRINGE) IMPLANT
TAPE ADH 3 LX (MISCELLANEOUS) IMPLANT
TAPE TRANSPORE STRL 2 31045 (GAUZE/BANDAGES/DRESSINGS) IMPLANT
TOWEL OR 17X26 4PK STRL BLUE (TOWEL DISPOSABLE) IMPLANT

## 2015-05-19 NOTE — OR Nursing (Signed)
Dr Marry Guan and Dr Ronelle Nigh to see patient.  Sats reports and patient also has cough.  Chest xray ordered per Dr Marry Guan.

## 2015-05-19 NOTE — OR Nursing (Signed)
[name] (caregiver from group home)  Called nurse from facility and they decided they would like to be seen in ER today.  Notified Dr Marry Guan and called report to charge nurse in ER. Patient transported via stretcher to ER.

## 2015-05-19 NOTE — ED Provider Notes (Signed)
[facility] Emergency Department Provider Note  ____________________________________________  Time seen: Approximately [id number] AM  I have reviewed the triage vital signs and the nursing notes.   HISTORY  Chief Complaint Cough and Pneumonia Direct history from the patient is limited by her mental retardation.   HPI Helen Patton is a 55 y.o. female with a history of mental retardation who is presenting to the emergency department with a cough and right-sided pneumonia. Per her mother and caretaker the patient come in today for a right-sided hip revision. She was given Ativan earlier this morning for sedation so she could tolerate the procedure. However, when she arrived at the hospital she began coughing. Cough. Wet. She was found to have an oxygen saturation of 81%. After coughing spells she would resolve up to 9395% but then desat back to the high 80s. She was placed on nasal cannula and transferred to the emergency department. The family denies her being admitted within the past 3 months the hospital. The patient is a permanent resident at a group home.   Past Medical History  Diagnosis Date  . Mental retardation   . GERD (gastroesophageal reflux disease)   . Osteoporosis   . Hypertension   . Seizures ([place])   . MRSA (methicillin resistant staph aureus) culture positive 2012  . Hidradenitis 2012    MRSA, on chronic topical suppression  . Cancer [facility]) Oct 2015    invasive mammary carcinoma  . Breast cancer ([place]) 2015    [name]  . Falls     Patient Active Problem List   Diagnosis Date Noted  . Anxiety 12/14/2014  . Arthralgia of hip 12/14/2014  . Athlete's foot 12/14/2014  . Cerebral palsy ([place]) 12/14/2014  . Cognitive decline 12/14/2014  . Clinical depression 12/14/2014  . Acid reflux 12/14/2014  . HLD (hyperlipidemia) 12/14/2014  . BP (high blood pressure) 12/14/2014  . Methicillin resistant Staphylococcus aureus infection  12/14/2014  . Adiposity 12/14/2014  . Breast cancer ([place]) 11/21/2013  . Mental retardation 11/16/2013  . Seizure ([place]) 09/30/2013    Past Surgical History  Procedure Laterality Date  . Broken leg    . Dislocated hip    . Cervical ablation    . Breast surgery Left 12-07-13    T1b, Nx; ER+, PR+, her 2 pending. (Not candidate for chemotherapy)    Current Outpatient Rx  Name  Route  Sig  Dispense  Refill  . alendronate (FOSAMAX) 70 MG tablet      1 TAB PO WEEKLY EARLY AM BEFORE FOOD/MEDS W/WATER. DON'T LIE DOWN FOR30 MIN (OSTEOPOROSIS) *NO CRUSH*   5 tablet   12   . amLODipine (NORVASC) 10 MG tablet   Oral   Take 1 tablet (10 mg total) by mouth daily.   30 tablet   12   . BENZOYL PEROXIDE 5 % external wash      USE TO WASH AFFECTED AREA DAILY   237 g   0   . calcium-vitamin D (OSCAL WITH D) 500-200 MG-UNIT per tablet   Oral   Take 1 tablet by mouth daily with breakfast.   30 tablet   12   . clindamycin (CLEOCIN T) 1 % external solution   Topical   Apply topically daily.   60 mL   12   . clotrimazole (LOTRIMIN) 1 % cream   Topical   Apply 1 application topically 2 (two) times daily. For rash in skin folds         .  docusate sodium (COLACE) 100 MG capsule   Oral   Take 1 capsule (100 mg total) by mouth 2 (two) times daily.   60 capsule   12   . DULoxetine (CYMBALTA) 60 MG capsule   Oral   Take 1 capsule (60 mg total) by mouth daily.   30 capsule   12   . HYDROcodone-acetaminophen (NORCO/VICODIN) 5-325 MG tablet   Oral   Take 1 tablet by mouth every 6 (six) hours as needed for moderate pain.   100 tablet   0   . lamoTRIgine (LAMICTAL) 150 MG tablet   Oral   Take 300 mg by mouth 2 (two) times daily.          Marland Kitchen letrozole (FEMARA) 2.5 MG tablet   Oral   Take 2.5 mg by mouth daily.         Marland Kitchen loratadine (CLARITIN) 10 MG tablet   Oral   Take 1 tablet (10 mg total) by mouth daily.   30 tablet   12   . LORazepam (ATIVAN) 1 MG tablet   Oral    Take 1 mg by mouth every 8 (eight) hours as needed (take one tablet by mouth as needed for seizure lasting longer than 3 min.  or more than 2 seizures in 2 hours).          . losartan (COZAAR) 100 MG tablet   Oral   Take 1 tablet (100 mg total) by mouth daily.   30 tablet   12   . meloxicam (MOBIC) 7.5 MG tablet   Oral   Take 7.5 mg by mouth 2 (two) times daily. With meals         . omeprazole (PRILOSEC) 20 MG capsule      TAKE 1 CAPSULE BY MOUTH TWICE DAILY FOR GERD. (DO NOT CRUSH)   60 capsule   12   . terbinafine (LAMISIL) 1 % cream      Apply to affected area 2 times daily until clear and then 2 times a week for suppression Patient taking differently: Apply to affected area 2 times daily until clear and then 2 times a week for suppression ( on feet)   30 g   2     Allergies Sulfa antibiotics and Keflex  Family History  Problem Relation Age of Onset  . Diabetes Father   . Parkinson's disease Father   . Cancer Maternal Aunt 63    breast  . Breast cancer Maternal Aunt   . Neuropathy Mother   . Arthritis Mother   . Autoimmune disease Brother     AIDS  . Hypertension Maternal Grandmother   . Cancer Paternal Grandmother     Social History Social History  Substance Use Topics  . Smoking status: Never Smoker   . Smokeless tobacco: Never Used  . Alcohol Use: No    Review of Systems  Caveat secondary to nonverbal.  ____________________________________________   PHYSICAL EXAM:  VITAL SIGNS: ED Triage Vitals  Enc Vitals Group     BP 05/19/15 0950 105/50 mmHg     Pulse Rate 05/19/15 0950 89     Resp 05/19/15 0950 18     Temp 05/19/15 0950 98.8 F (37.1 C)     Temp Source 05/19/15 0950 Oral     SpO2 05/19/15 0950 89 %     Weight 05/19/15 0950 147 lb (66.679 kg)     Height 05/19/15 0950 4\' 11"  (1.499 m)     Head Cir --  Peak Flow --      Pain Score 05/19/15 0954 0     Pain Loc --      Pain Edu? --      Excl. in [place]? --     Constitutional:  Alert. Well appearing and in no acute distress. Eyes: Conjunctivae are normal. PERRL. EOMI. Head: Atraumatic. Nose: No congestion/rhinnorhea. Mouth/Throat: Mucous membranes are moist.   Neck: No stridor.   Cardiovascular: Normal rate, regular rhythm. Grossly normal heart sounds.  Good peripheral circulation. Respiratory: Normal respiratory effort.  No retractions. Lungs CTAB. Gastrointestinal: Soft and nontender. No distention. No abdominal bruits. No CVA tenderness. Musculoskeletal: No lower extremity tenderness nor edema.  No joint effusions. Neurologic:  No gross focal neurologic deficits are appreciated. Skin:  Skin is warm, dry and intact. No rash noted.   ____________________________________________   LABS (all labs ordered are listed, but only abnormal results are displayed)  Labs Reviewed  CBC WITH DIFFERENTIAL/PLATELET  BASIC METABOLIC PANEL   ____________________________________________  EKG   ____________________________________________  RADIOLOGY   IMPRESSION: Increased airspace opacity seen in right upper lobe concerning for pneumonia or atelectasis. Continued short-term follow-up radiographs are recommended to ensure resolution.   Electronically Signed  By: Marijo Conception, M.D.  On: 05/19/2015 07:21 ____________________________________________   PROCEDURES  ____________________________________________   INITIAL IMPRESSION / ASSESSMENT AND PLAN / ED COURSE  Pertinent labs & imaging results that were available during my care of the patient were reviewed by me and considered in my medical decision making (see chart for details).  ----------------------------------------- 10:59 AM on 05/19/2015 -----------------------------------------  Patient requiring nasal cannula O2. Family says that she has not vomited so no known episode that would've caused aspiration. However, they said that she stuffed scooter mouth and may have aspirated this way  causing a pneumonia. ____________________________________________   FINAL CLINICAL IMPRESSION(S) / ED DIAGNOSES  Community acquired pneumonia    Orbie Pyo, MD 05/19/15 1100

## 2015-05-19 NOTE — H&P (Signed)
[place] at [place] NAME: Helen Patton    MR#:  [id number]  DATE OF BIRTH:  [birth year]  DATE OF ADMISSION:  05/19/2015  PRIMARY CARE PHYSICIAN: Wilhemena Durie, MD   REQUESTING/REFERRING PHYSICIAN: Dr. Suezanne Jacquet  CHIEF COMPLAINT:   Chief Complaint  Patient presents with  . Cough  . Pneumonia    HISTORY OF PRESENT ILLNESS:  Helen Patton  is a 55 y.o. female with a known history of microcephaly and mental retardation. The patient is nonverbal. The patient was scheduled to have a right hip revision by [name]. The procedure was canceled secondary to hypoxia. When the patient was placed on oxygen then she started coughing. A chest x-ray revealed a right upper lobe pneumonia. Hospitalist services were contacted for further evaluation. History obtained from old chart and family at the bedside. Patient has been wheelchair bound since her hip issues. As per family, she is in pain and takes Norco and that was ordered.  PAST MEDICAL HISTORY:   Past Medical History  Diagnosis Date  . Mental retardation   . GERD (gastroesophageal reflux disease)   . Osteoporosis   . Hypertension   . Seizures ([place])   . MRSA (methicillin resistant staph aureus) culture positive 2012  . Hidradenitis 2012    MRSA, on chronic topical suppression  . Cancer [facility]) Oct 2015    invasive mammary carcinoma  . Breast cancer ([place]) 2015    [name]  . [place]     PAST SURGICAL HISTORY:   Past Surgical History  Procedure Laterality Date  . Broken leg    . Dislocated hip    . Cervical ablation    . Breast surgery Left 12-07-13    T1b, Nx; ER+, PR+, her 2 pending. (Not candidate for chemotherapy)    SOCIAL HISTORY:   Social History  Substance Use Topics  . Smoking status: Never Smoker   . Smokeless tobacco: Never Used  . Alcohol Use: No    FAMILY HISTORY:   Family History  Problem Relation Age of Onset  . Diabetes  Father   . Parkinson's disease Father   . Cancer Maternal Aunt 63    breast  . Breast cancer Maternal Aunt   . Neuropathy Mother   . Arthritis Mother   . Diabetes Mother   . Autoimmune disease Brother     AIDS  . Hypertension Maternal Grandmother   . Cancer Paternal Grandmother     DRUG ALLERGIES:   Allergies  Allergen Reactions  . Sulfa Antibiotics Other (See Comments)    "Does not know"  . Keflex [Cephalexin] Other (See Comments)    "Does not know" unknown    REVIEW OF SYSTEMS:  Unable to obtain since the patient is nonverbal.  MEDICATIONS AT HOME:   Prior to Admission medications   Medication Sig Start Date End Date Taking? Authorizing Provider  alendronate (FOSAMAX) 70 MG tablet 1 TAB PO WEEKLY EARLY AM BEFORE FOOD/MEDS W/WATER. DON'T LIE DOWN FOR30 MIN (OSTEOPOROSIS) *NO CRUSH* 08/03/14  Yes Mekenzie Modeste Maceo Pro., MD  amLODipine (NORVASC) 10 MG tablet Take 1 tablet (10 mg total) by mouth daily. 04/05/15  Yes Alyric Parkin Maceo Pro., MD  BENZOYL PEROXIDE 5 % external wash USE TO [facility] AFFECTED AREA DAILY 10/24/14  Yes Caulder Wehner Maceo Pro., MD  calcium-vitamin D Darron Doom WITH D) 500-200 MG-UNIT per tablet Take 1 tablet by mouth daily with breakfast. 08/03/14  Yes Jerrol Banana., MD  clindamycin (CLEOCIN T) 1 % external solution Apply topically daily. 10/04/14  Yes Maliyah Willets Maceo Pro., MD  clotrimazole (LOTRIMIN) 1 % cream Apply 1 application topically 2 (two) times daily. For rash in skin folds   Yes Historical Provider, MD  docusate sodium (COLACE) 100 MG capsule Take 1 capsule (100 mg total) by mouth 2 (two) times daily. 04/05/15  Yes Rishit Burkhalter Maceo Pro., MD  DULoxetine (CYMBALTA) 60 MG capsule Take 1 capsule (60 mg total) by mouth daily. 04/05/15  Yes Hulet Ehrmann Maceo Pro., MD  HYDROcodone-acetaminophen (NORCO/VICODIN) 5-325 MG tablet Take 1 tablet by mouth every 6 (six) hours as needed for moderate pain. 02/17/15  Yes Ajia Chadderdon Maceo Pro., MD  lamoTRIgine (LAMICTAL) 150  MG tablet Take 300 mg by mouth 2 (two) times daily.    Yes Historical Provider, MD  letrozole (FEMARA) 2.5 MG tablet Take 2.5 mg by mouth daily.   Yes Historical Provider, MD  loratadine (CLARITIN) 10 MG tablet Take 1 tablet (10 mg total) by mouth daily. 04/05/15  Yes Dailee Manalang Maceo Pro., MD  LORazepam (ATIVAN) 1 MG tablet Take 1 mg by mouth every 8 (eight) hours as needed (take one tablet by mouth as needed for seizure lasting longer than 3 min.  or more than 2 seizures in 2 hours).    Yes Historical Provider, MD  losartan (COZAAR) 100 MG tablet Take 1 tablet (100 mg total) by mouth daily. 04/05/15  Yes Pa Tennant Maceo Pro., MD  meloxicam (MOBIC) 7.5 MG tablet Take 7.5 mg by mouth 2 (two) times daily. With meals   Yes Historical Provider, MD  omeprazole (PRILOSEC) 20 MG capsule TAKE 1 CAPSULE BY MOUTH TWICE DAILY FOR GERD. (DO NOT CRUSH) 12/28/14  Yes Christina Gintz Maceo Pro., MD  terbinafine (LAMISIL) 1 % cream Apply to affected area 2 times daily until clear and then 2 times a week for suppression Patient taking differently: Apply to affected area 2 times daily until clear and then 2 times a week for suppression ( on feet) 05/04/15  Yes Jerrol Banana., MD      VITAL SIGNS:  Blood pressure 105/50, pulse 89, temperature 98.8 F (37.1 C), temperature source Oral, resp. rate 18, height 4\' 11"  (1.499 m), weight 66.679 kg (147 lb), SpO2 97 %.  PHYSICAL EXAMINATION:  GENERAL:  55 y.o.-year-old patient lying in the bed, moving around in pain.  EYES: Pupils equal, round, reactive to light and accommodation. No scleral icterus.  HEENT: Head atraumatic, normocephalic. Oropharynx and nasopharynx clear.  NECK:  Supple, no jugular venous distention. No thyroid enlargement, no tenderness.  LUNGS: Decreased breath sounds bilaterally, no wheezing, rales,rhonchi or crepitation. No use of accessory muscles of respiration.  CARDIOVASCULAR: S1, S2 normal. No murmurs, rubs, or gallops.  ABDOMEN: Soft,  nontender, nondistended. Bowel sounds present. No organomegaly or mass.  EXTREMITIES: No pedal edema, cyanosis, or clubbing.  NEUROLOGIC: Patient moves her extremities on her own PSYCHIATRIC: The patient is alert.  SKIN: No rash, lesion, or ulcer.   LABORATORY PANEL:   CBC  Recent Labs Lab 05/19/15 1052  WBC 12.4*  HGB 11.3*  HCT 34.3*  PLT 269   ------------------------------------------------------------------------------------------------------------------  Chemistries   Recent Labs Lab 05/19/15 1052  NA 135  K 3.7  CL 100*  CO2 30  GLUCOSE 123*  BUN 19  CREATININE 0.56  CALCIUM 8.9   ------------------------------------------------------------------------------------------------------------------    RADIOLOGY:  X-ray Chest Pa Or Ap  05/19/2015  CLINICAL DATA:  Hypoxia.  Preop.  EXAM: CHEST 1 VIEW COMPARISON:  March 27, 2015. FINDINGS: Stable cardiomediastinal silhouette. Calcified mediastinal adenopathy is again noted and unchanged. Left lung is clear. Increased airspace opacity is noted laterally in right upper lobe concerning for pneumonia or atelectasis. Bony thorax is unremarkable. Hypoinflation of the lungs is noted. IMPRESSION: Increased airspace opacity seen in right upper lobe concerning for pneumonia or atelectasis. Continued short-term follow-up radiographs are recommended to ensure resolution. Electronically Signed   By: Marijo Conception, M.D.   On: 05/19/2015 07:21    IMPRESSION AND PLAN:   1. Acute respiratory failure with hypoxia. Pulse ox 89% on room air. Give oxygen supplementation 2. Right upper lobe pneumonia with leukocytosis. Since the patient is from her group home will get a little more aggressive with antibiotics with Zosyn and Levaquin. Hold off on vancomycin at this time because this is likely an aspiration. I will get swallow evaluation. 3. Microcephaly and mental retardation 4. Anxiety continue psychiatric medications 5. Seizure disorder  on Lamictal 6. Gastroesophageal reflux disease on PPI 7. Right hip pain on Norco. Follow-up with [name] as outpatient 8. Impaired fasting glucose and on a hemoglobin A1c to laboratory data   All the records are reviewed and case discussed with ED provider. Management plans discussed with the patient, family and they are in agreement.  CODE STATUS: Full code  TOTAL TIME TAKING CARE OF THIS PATIENT: 50 minutes.    Loletha Grayer M.D on 05/19/2015 at 12:39 PM  Between 7am to 6pm - Pager - [phone]  After 6pm call admission pager [phone]  [employer] Office  [phone]  CC: Primary care physician; Wilhemena Durie, MD

## 2015-05-19 NOTE — ED Notes (Signed)
Report given to Maryland Pink, RN

## 2015-05-19 NOTE — OR Nursing (Signed)
Dr Marry Guan in discussed plan of care with family and caregiver.  O2 discontinued at this time O2 sats 91-92 on room air, when patient coughs O2 sats 95%.

## 2015-05-19 NOTE — OR Nursing (Signed)
Dr Marry Guan in to see patient and family.  He spoke with Dr Rosanna Randy.  Dr. Rosanna Randy is calling in antibiotic.  Order received to discharge at this time.  Family was instructed if anything changed with patient fever, altered mental status, if symptoms worsen etc to bring patient back to ER.

## 2015-05-19 NOTE — H&P (Signed)
The patient has been re-examined, and the chart reviewed, and there have been no interval changes to the documented history and physical.    The risks, benefits, and alternatives have been discussed at length. The patient expressed understanding of the risks benefits and agreed with plans for surgical intervention.  James P. Hooten, Jr. M.D.    

## 2015-05-19 NOTE — ED Notes (Signed)
Scheduled for right hip replacement this morning.  Once in pre-op, patient noted to be coughing and low sats.  Chest X-Ray done and right lung noted to have an infiltrated and aspiration pneumonia suspected.  Patient lives in group home and home wasa concerned that condition would deteriorate over the weekend on oral antibiotics. Sent to ED for evaluation.

## 2015-05-19 NOTE — OR Nursing (Signed)
O2 sats 83% on room air, Nasal canula with 3L O2 administered. sats up to 94%.

## 2015-05-19 NOTE — Anesthesia Preprocedure Evaluation (Deleted)

## 2015-05-20 ENCOUNTER — Inpatient Hospital Stay: Payer: Medicare Other

## 2015-05-20 LAB — BASIC METABOLIC PANEL
ANION GAP: 7 (ref 5–15)
BUN: 15 mg/dL (ref 6–20)
CHLORIDE: 96 mmol/L — AB (ref 101–111)
CO2: 29 mmol/L (ref 22–32)
CREATININE: 0.67 mg/dL (ref 0.44–1.00)
Calcium: 8.4 mg/dL — ABNORMAL LOW (ref 8.9–10.3)
GFR calc non Af Amer: >60 mL/min (ref >60)
Glucose, Bld: 103 mg/dL — ABNORMAL HIGH (ref 65–99)
Potassium: 3.2 mmol/L — ABNORMAL LOW (ref 3.5–5.1)
Sodium: 132 mmol/L — ABNORMAL LOW (ref 135–145)

## 2015-05-20 LAB — CBC
HCT: 34.7 % — ABNORMAL LOW (ref 35.0–47.0)
HEMOGLOBIN: 11.6 g/dL — AB (ref 12.0–16.0)
MCH: 30 pg (ref 26.0–34.0)
MCHC: 33.6 g/dL (ref 32.0–36.0)
MCV: 89.3 fL (ref 80.0–100.0)
Platelets: 278 10*3/uL (ref 150–440)
RBC: 3.88 MIL/uL (ref 3.80–5.20)
RDW: 14.6 % — ABNORMAL HIGH (ref 11.5–14.5)
WBC: 7.6 10*3/uL (ref 3.6–11.0)

## 2015-05-20 MED ORDER — LORAZEPAM 0.5 MG PO TABS
0.5000 mg | ORAL_TABLET | Freq: Three times a day (TID) | ORAL | Status: DC | PRN
  Administered 2015-05-20 – 2015-05-21 (×2): 0.5 mg via ORAL
  Filled 2015-05-20 (×3): qty 1

## 2015-05-20 NOTE — Progress Notes (Signed)
[place] at [place] NAME: Helen Patton    MR#:  [id number]  DATE OF BIRTH:  [birth year]  SUBJECTIVE:  CHIEF COMPLAINT:   Chief Complaint  Patient presents with  . Cough  . Pneumonia   - Patient with microcephaly and MR - family at bedside, patient restless and has right hip pain - can get impulsive at times  REVIEW OF SYSTEMS:  Review of Systems  Unable to perform ROS: psychiatric disorder    DRUG ALLERGIES:   Allergies  Allergen Reactions  . Sulfa Antibiotics Other (See Comments)    "Does not know"  . Keflex [Cephalexin] Other (See Comments)    "Does not know" unknown    VITALS:  Blood pressure 103/52, pulse 90, temperature 98.6 F (37 C), temperature source Oral, resp. rate 20, height 4\' 11"  (1.499 m), weight 66.679 kg (147 lb), SpO2 97 %.  PHYSICAL EXAMINATION:  Physical Exam  GENERAL:  55 y.o.-year-old patient lying in the bed with no acute distress. Restless in bed EYES: Pupils equal, round, reactive to light and accommodation. No scleral icterus. Extraocular muscles intact.  HEENT: Head microcephalic,atraumatic, normocephalic. Oropharynx and nasopharynx clear. Poor dentition NECK:  Supple, no jugular venous distention. No thyroid enlargement, no tenderness.  LUNGS: Normal breath sounds bilaterally, no wheezing, rales,rhonchi or crepitation. No use of accessory muscles of respiration.  CARDIOVASCULAR: S1, S2 normal. No rubs, or gallops. Soft 3/6 systolic murmur present.  ABDOMEN: Soft, nontender, nondistended. Bowel sounds present. No organomegaly or mass.  EXTREMITIES: No pedal edema, cyanosis, or clubbing.  NEUROLOGIC: moving both arms in bed, not typically following commands, speech not very clear. At baseline per Mother and group home supervisor PSYCHIATRIC: The patient is alert, unable to test orientation.  SKIN: No obvious rash, lesion, or ulcer.    LABORATORY PANEL:   CBC  Recent Labs Lab  05/20/15 0548  WBC 7.6  HGB 11.6*  HCT 34.7*  PLT 278   ------------------------------------------------------------------------------------------------------------------  Chemistries   Recent Labs Lab 05/20/15 0548  NA 132*  K 3.2*  CL 96*  CO2 29  GLUCOSE 103*  BUN 15  CREATININE 0.67  CALCIUM 8.4*   ------------------------------------------------------------------------------------------------------------------  Cardiac Enzymes No results for input(s): TROPONINI in the last 168 hours. ------------------------------------------------------------------------------------------------------------------  RADIOLOGY:  X-ray Chest Pa Or Ap  05/19/2015  CLINICAL DATA:  Hypoxia.  Preop. EXAM: CHEST 1 VIEW COMPARISON:  March 27, 2015. FINDINGS: Stable cardiomediastinal silhouette. Calcified mediastinal adenopathy is again noted and unchanged. Left lung is clear. Increased airspace opacity is noted laterally in right upper lobe concerning for pneumonia or atelectasis. Bony thorax is unremarkable. Hypoinflation of the lungs is noted. IMPRESSION: Increased airspace opacity seen in right upper lobe concerning for pneumonia or atelectasis. Continued short-term follow-up radiographs are recommended to ensure resolution. Electronically Signed   By: Marijo Conception, M.D.   On: 05/19/2015 07:21    EKG:   Orders placed or performed during the hospital encounter of 09/26/14  . ED EKG  . ED EKG  . EKG    ASSESSMENT AND PLAN:   55 year old female with past medical history significant for microcephaly, mental retardation, hypertension, seizure disorder presented from group home to get an elective right hip surgery but was noted to be hypoxic.  #1 aspiration pneumonia-right upper lobe infiltrate. -No swallowing muscle difficulty noted. Appreciate speech therapy consult. Mostly due to her impulsive behavior and sometimes tries to eat a larger bouts. -For now placed on honey thick liquid  pured diet. -Might need a modified barium swallow study for objective evidence. -Continue Zosyn -Wean O2 as tolerated. Follow-up chest x-ray tomorrow.  #2 right hip pain-due to avascular necrosis. Prior right femoral neck fracture surgery done on the same side. -Surgery was supposed to be scheduled yesterday however canceled due to her hypoxemia. -We'll check with [name] prior to discharge about rescheduling the surgery.  #3 hypertension-continue Cozaar and Norvasc  #4 seizure disorder-on Lamictal  #5 history of breast cancer-stable. On letrozole.  #6 DVT prophylaxis-Lovenox   All the records are reviewed and case discussed with Care Management/Social Workerr. Management plans discussed with the patient, family and they are in agreement.  CODE STATUS: Full Code  TOTAL TIME TAKING CARE OF THIS PATIENT: 37 minutes.   POSSIBLE D/C IN 2 DAYS, DEPENDING ON CLINICAL CONDITION.   Gladstone Lighter M.D on 05/20/2015 at 12:51 PM  Between 7am to 6pm - Pager - [phone]  After 6pm go to [email or web address] - password [email or web address] [facility]  Office  [phone]  CC: Primary care physician; Wilhemena Durie, MD

## 2015-05-20 NOTE — Evaluation (Signed)
Clinical/Bedside Swallow Evaluation Patient Details  Name: Helen Patton MRN: [id number] Date of Birth: 10/14/1960  Today's Date: 05/20/2015 Time: SLP Start Time (ACUTE ONLY): 0910 SLP Stop Time (ACUTE ONLY): 1022 SLP Time Calculation (min) (ACUTE ONLY): 72 min  Past Medical History:  Past Medical History  Diagnosis Date  . Mental retardation   . GERD (gastroesophageal reflux disease)   . Osteoporosis   . Hypertension   . Seizures ([place])   . MRSA (methicillin resistant staph aureus) culture positive 2012  . Hidradenitis 2012    MRSA, on chronic topical suppression  . Cancer [facility]) Oct 2015    invasive mammary carcinoma  . Breast cancer ([place]) 2015    [name]  . Falls    Past Surgical History:  Past Surgical History  Procedure Laterality Date  . Broken leg    . Dislocated hip    . Cervical ablation    . Breast surgery Left 12-07-13    T1b, Nx; ER+, PR+, her 2 pending. (Not candidate for chemotherapy)   HPI: Helen Patton is a 55 y.o. female with a known history of microcephaly and mental retardation. She was scheduled to have a right hip revision by [name]. The procedure was canceled secondary to hypoxia. When the patient was placed on oxygen then she started coughing. A chest x-ray revealed a right upper lobe pneumonia. She was admitted and ST consult was obtained to assess for aspiration.     Assessment / Plan / Recommendation Clinical Impression  Pt presents with increased risk of aspiration secondary to her cognitive status. Pt has MR and is very impulsive when self feeding and often holds food inmouth needinf cues sto swallow. Today, pt tolerated all consistencies with ST with no s/s of aspiration.When given bites of puree and sips of thin liquid by cup, she often needed cues to swallow the boluses. pt sis tolerate sips of thin liquid by straw as well as sips of honey thick liquid by cup with no need for cues to swallow. Some difficulty noted with  mastication of soft solid seconadary to poor detition. (one functioning tooth) Vocal qaulity remained clear throughout assessment, laryngeal elevation appeared adequate. Rec Dysphgagia 1 diet with honey thick liquids for now.Consider trial of thinliquids by straw with ST only in hopes of diet upgrade at some point. Discussed need for strict aspiration precautions to include allowing Pt to ASSIST with feeding but not independenlty feed secondary to impulsivity. Limit distractions during meals.Will monitor closely for s/s of aspiration with new diet and modify as appropriate.     Aspiration Risk  Moderate aspiration risk    Diet Recommendation  Dysphagia 1 with honey thick liquids   Medication Administration: Whole meds with liquid ( HONEY. pills may need to be crushed and given in applesauce)    Other  Recommendations Oral Care Recommendations: Oral care BID   Follow up Recommendations  Skilled Nursing facility    Frequency and Duration min 3x week  1 week       Prognosis Prognosis for Safe Diet Advancement: Fair Barriers to Reach Goals: Cognitive deficits      Swallow Study   General Date of Onset: 05/19/15 Type of Study: Bedside Swallow Evaluation Previous Swallow Assessment: Family reports Pt has had a MBSS 1-2 years ago. Mech soft diet at that time with precautions and cues needed. Diet Prior to this Study: Dysphagia 1 (puree) Temperature Spikes Noted: No Respiratory Status: Room air Behavior/Cognition: Distractible;Requires cueing Oral Cavity Assessment: Within Functional  Limits Oral Care Completed by SLP: No Oral Cavity - Dentition: Missing dentition Vision: Functional for self-feeding Self-Feeding Abilities: Able to feed self Patient Positioning: Upright in bed Baseline Vocal Quality: Normal Volitional Cough: Strong Volitional Swallow: Unable to elicit    Oral/Motor/Sensory Function Facial ROM: Within Functional Limits Facial Symmetry: Within Functional Limits Facial  Strength: Within Functional Limits Facial Sensation: Within Functional Limits Lingual ROM: Within Functional Limits Lingual Symmetry: Within Functional Limits Lingual Strength: Within Functional Limits Lingual Sensation: Within Functional Limits Mandible: Within Functional Limits   Ice Chips Ice chips: Not tested   Thin Liquid Thin Liquid: Within functional limits Presentation: Cup;Straw;Spoon Other Comments: Pt needed cues to swallow with cup and spoon trails    Nectar Thick     Honey Thick Honey Thick Liquid: Within functional limits Presentation: Cup   Puree Puree: Impaired Presentation: Spoon Oral Phase Functional Implications: Oral holding   Solid   GO   Solid: Impaired Presentation: Self Fed Oral Phase Impairments: Impaired mastication        Lucila Maine 05/20/2015,10:24 AM

## 2015-05-21 LAB — BASIC METABOLIC PANEL
Anion gap: 4 — ABNORMAL LOW (ref 5–15)
BUN: 20 mg/dL (ref 6–20)
CALCIUM: 8.8 mg/dL — AB (ref 8.9–10.3)
CO2: 32 mmol/L (ref 22–32)
CREATININE: 0.58 mg/dL (ref 0.44–1.00)
Chloride: 102 mmol/L (ref 101–111)
GFR calc non Af Amer: >60 mL/min (ref >60)
GLUCOSE: 106 mg/dL — AB (ref 65–99)
Potassium: 3.5 mmol/L (ref 3.5–5.1)
Sodium: 138 mmol/L (ref 135–145)

## 2015-05-21 NOTE — Progress Notes (Signed)
Helen Patton NAME: Helen Patton    MR#:  [id number]  DATE OF BIRTH:  [birth year]  SUBJECTIVE:  CHIEF COMPLAINT:   Chief Complaint  Patient presents with  . Cough  . Pneumonia   - Patient with microcephaly and MR - family at bedside, no complaints - CXR improving  REVIEW OF SYSTEMS:  Review of Systems  Unable to perform ROS: psychiatric disorder    DRUG ALLERGIES:   Allergies  Allergen Reactions  . Sulfa Antibiotics Other (See Comments)    "Does not know"  . Keflex [Cephalexin] Other (See Comments)    "Does not know" unknown    VITALS:  Blood pressure 107/57, pulse 98, temperature 98.9 F (37.2 C), temperature source Oral, resp. rate 20, height 4\' 11"  (1.499 m), weight 66.679 kg (147 lb), SpO2 95 %.  PHYSICAL EXAMINATION:  Physical Exam  GENERAL:  55 y.o.-year-old patient lying in the bed with no acute distress. Restless in bed EYES: Pupils equal, round, reactive to light and accommodation. No scleral icterus. Extraocular muscles intact.  HEENT: Head microcephalic,atraumatic, normocephalic. Oropharynx and nasopharynx clear. Poor dentition NECK:  Supple, no jugular venous distention. No thyroid enlargement, no tenderness.  LUNGS: Normal breath sounds bilaterally, no wheezing, rales,rhonchi or crepitation. No use of accessory muscles of respiration.  CARDIOVASCULAR: S1, S2 normal. No rubs, or gallops. Soft 3/6 systolic murmur present.  ABDOMEN: Soft, nontender, nondistended. Bowel sounds present. No organomegaly or mass.  EXTREMITIES: No pedal edema, cyanosis, or clubbing.  NEUROLOGIC: moving both arms in bed, not typically following commands, speech not very clear. At baseline per Mother and group home supervisor PSYCHIATRIC: The patient is alert, unable to test orientation.  SKIN: No obvious rash, lesion, or ulcer.    LABORATORY PANEL:   CBC  Recent Labs Lab 05/20/15 0548  WBC 7.6  HGB 11.6*   HCT 34.7*  PLT 278   ------------------------------------------------------------------------------------------------------------------  Chemistries   Recent Labs Lab 05/21/15 0524  NA 138  K 3.5  CL 102  CO2 32  GLUCOSE 106*  BUN 20  CREATININE 0.58  CALCIUM 8.8*   ------------------------------------------------------------------------------------------------------------------  Cardiac Enzymes No results for input(s): TROPONINI in the last 168 hours. ------------------------------------------------------------------------------------------------------------------  RADIOLOGY:  Dg Chest Port 1 View  05/20/2015  CLINICAL DATA:  Follow up PNA. mental retardation pt- unable to follow breathing instructions. Best images obtained. Hx of breast Ca-left breast lumpectomy. Hx of HTN EXAM: PORTABLE CHEST 1 VIEW COMPARISON:  05/19/2015 FINDINGS: Cardiac silhouette is normal in size and configuration. No mediastinal or hilar masses or convincing adenopathy. Small calcified nodes noted along the AP window and left hilum. Clear lungs.  No pleural effusion or pneumothorax. Bony thorax is demineralized but grossly intact. IMPRESSION: No active disease. Electronically Signed   By: Lajean Manes M.D.   On: 05/20/2015 16:10    EKG:   Orders placed or performed during the hospital encounter of 09/26/14  . ED EKG  . ED EKG  . EKG    ASSESSMENT AND PLAN:   55 year old female with past medical history significant for microcephaly, mental retardation, hypertension, seizure disorder presented from group home to get an elective right hip surgery but was noted to be hypoxic.  #1 aspiration pneumonia-right upper lobe infiltrate. - Appreciate speech therapy consult. Mostly due to her impulsive behavior and sometimes tries to eat a larger bouts. Last MBSS was 1-2 years ago - another one scheduled for tomorrow -continue honey thick liquid pured diet. -Might  need a modified barium swallow study for  objective evidence. -Continue Zosyn- change to augmentin at discharge, CXR with improvement -Wean O2 as tolerated.  #2 right hip pain-due to avascular necrosis. Prior right femoral neck fracture surgery done on the same side. -Surgery was supposed to be scheduled 05/19/15 however canceled due to her hypoxemia. -reschedule as outpatient  #3 hypertension-continue Cozaar and Norvasc  #4 seizure disorder-on Lamictal  #5 history of breast cancer-stable. On letrozole.  #6 DVT prophylaxis-Lovenox  Possible discharge tomorrow   All the records are reviewed and case discussed with Care Management/Social Workerr. Management plans discussed with the patient, family and they are in agreement.  CODE STATUS: Full Code  TOTAL TIME TAKING CARE OF THIS PATIENT: 37 minutes.   POSSIBLE D/C IN 1 DAYS, DEPENDING ON CLINICAL CONDITION.   Gladstone Lighter M.D on 05/21/2015 at 9:16 AM  Between 7am to 6pm - Pager - [phone]  After 6pm go to [email or web address] - password [email or web address] [facility]  Office  [phone]  CC: Primary care physician; Wilhemena Durie, MD

## 2015-05-22 ENCOUNTER — Inpatient Hospital Stay: Payer: Medicare Other

## 2015-05-22 ENCOUNTER — Encounter: Payer: Self-pay | Admitting: Radiology

## 2015-05-22 MED ORDER — IPRATROPIUM-ALBUTEROL 0.5-2.5 (3) MG/3ML IN SOLN
3.0000 mL | Freq: Three times a day (TID) | RESPIRATORY_TRACT | Status: DC
  Administered 2015-05-22 – 2015-05-24 (×6): 3 mL via RESPIRATORY_TRACT
  Filled 2015-05-22 (×6): qty 3

## 2015-05-22 MED ORDER — HYDROCODONE-ACETAMINOPHEN 5-325 MG PO TABS: 1.0000 | ORAL_TABLET | Freq: Four times a day (QID) | ORAL | Status: DC | PRN

## 2015-05-22 MED ORDER — IOPAMIDOL (ISOVUE-370) INJECTION 76%
75.0000 mL | Freq: Once | INTRAVENOUS | Status: AC | PRN
  Administered 2015-05-22: 75 mL via INTRAVENOUS

## 2015-05-22 MED ORDER — LORAZEPAM 1 MG PO TABS: 1.0000 mg | ORAL_TABLET | Freq: Three times a day (TID) | ORAL | Status: DC | PRN

## 2015-05-22 MED ORDER — AMOXICILLIN-POT CLAVULANATE 875-125 MG PO TABS: 1.0000 | ORAL_TABLET | Freq: Two times a day (BID) | ORAL | Status: DC

## 2015-05-22 MED ORDER — SODIUM CHLORIDE 0.9 % IV SOLN
3.0000 g | Freq: Four times a day (QID) | INTRAVENOUS | Status: DC
  Administered 2015-05-22 – 2015-05-23 (×4): 3 g via INTRAVENOUS
  Filled 2015-05-22 (×6): qty 3

## 2015-05-22 MED ORDER — IPRATROPIUM-ALBUTEROL 0.5-2.5 (3) MG/3ML IN SOLN: 3.0000 mL | Freq: Four times a day (QID) | RESPIRATORY_TRACT | Status: DC | PRN

## 2015-05-22 MED ORDER — GUAIFENESIN ER 600 MG PO TB12
600.0000 mg | ORAL_TABLET | Freq: Two times a day (BID) | ORAL | Status: DC
  Administered 2015-05-22 – 2015-05-24 (×4): 600 mg via ORAL
  Filled 2015-05-22 (×4): qty 1

## 2015-05-22 NOTE — Progress Notes (Signed)
CT called and requested 20g above wrist be placed for CT of chest.

## 2015-05-22 NOTE — Progress Notes (Signed)
SATURATION QUALIFICATIONS: (This note is used to comply with regulatory documentation for home oxygen)  Patient Saturations on Room Air at Rest = 84%  Patient Saturations on Room Air while Ambulating = patient wheelchair bound  Patient Saturations on 2 Liters of oxygen while Ambulating = 94%  Please briefly explain why patient needs home oxygen:

## 2015-05-22 NOTE — Evaluation (Signed)
Objective Swallowing Evaluation: Type of Study: MBS-Modified Barium Swallow Study  Patient Details  Name: Helen Patton MRN: [id number] Date of Birth: 21-Sep-1960  Today's Date: 05/22/2015 Time: SLP Start Time (ACUTE ONLY): 0930-SLP Stop Time (ACUTE ONLY): 1030 SLP Time Calculation (min) (ACUTE ONLY): 60 min  Past Medical History:  Past Medical History  Diagnosis Date  . Mental retardation   . GERD (gastroesophageal reflux disease)   . Osteoporosis   . Hypertension   . Seizures ([place])   . MRSA (methicillin resistant staph aureus) culture positive 2012  . Hidradenitis 2012    MRSA, on chronic topical suppression  . Cancer [facility]) Oct 2015    invasive mammary carcinoma  . Breast cancer ([place]) 2015    [name]  . Falls    Past Surgical History:  Past Surgical History  Procedure Laterality Date  . Broken leg    . Dislocated hip    . Cervical ablation    . Breast surgery Left 12-07-13    T1b, Nx; ER+, PR+, her 2 pending. (Not candidate for chemotherapy)   No Data Recorded  Subjective: Pt was alert and pleasant. Family and caregiver present  Assessment / Plan / Recommendation  Subjective:  HISTORY: Helen Patton is a 55 y.o. female with a known history of microcephaly and mental retardation. The patient is nonverbal. The patient was scheduled to have a right hip revision by [name]. The procedure was canceled secondary to hypoxia. When the patient was placed on oxygen then she started coughing. A chest x-ray revealed a right upper lobe pneumonia. Hospitalist services were contacted for further evaluation. History obtained from old chart and family at the bedside. Patient has been wheelchair bound since her hip issues.   The patient was seen for clinical bedside swallow eval on 05/20/2015 and found to present: with increased risk of aspiration secondary to her cognitive status. Pt has MR and is very impulsive when self feeding and often holds food in mouth needing  cues to swallow. Today, pt tolerated all consistencies with ST with no s/s of aspiration. When given bites of puree and sips of thin liquid by cup, she often needed cues to swallow the boluses. pt tolerates sips of thin liquid by straw as well as sips of honey thick liquid by cup with no need for cues to swallow. Some difficulty noted with mastication of soft solid secondary to poor dentition. (one functioning tooth) Vocal quality remained clear throughout assessment, laryngeal elevation appeared adequate. Rec Dysphagia 1 diet with honey thick liquids for now.      Objective:  Radiological Procedure: A videoflouroscopic evaluation of oral-preparatory, reflex initiation, and pharyngeal phases of the swallow was performed; as well as a screening of the upper esophageal phase.  I. POSTURE: Upright in MBS chair  II. VIEW: Lateral  III. COMPENSATORY STRATEGIES: Verbal cues elicit swallow  IV. BOLUSES ADMINISTERED:   Thin Liquid: 2 teaspoon presentations; 3 cup rim gulps; 3 straw gulps   Nectar-thick Liquid: 2 teaspoon presentations    Puree: 2 teaspoon presentations   Mechanical Soft: Refused  V. RESULTS OF EVALUATION: A. ORAL PREPARATORY PHASE: (The lips, tongue, and velum are observed for strength and coordination)       **Overall Severity Rating: Moderate-severe; abnormal oral hold, refused to masticate cracker  B. SWALLOW INITIATION/REFLEX: (The reflex is normal if "triggered" by the time the bolus reached the base of the tongue)  **Overall Severity Rating: Mild; triggers at valleculae or while falling from the valleculae to  the pyriform sinuses  C. PHARYNGEAL PHASE: (Pharyngeal function is normal if the bolus shows rapid, smooth, and continuous transit through the pharynx and there is no pharyngeal residue after the swallow)  **Overall Severity Rating: Minimal; minimal decreased tongue base retraction, hyolaryngeal movement, trace pharyngeal residue post swallow.  D. LARYNGEAL  PENETRATION: (Material entering into the laryngeal inlet/vestibule but not aspirated) Transient laryngeal penetration during swallows of thin liquid  E. ASPIRATION: None  F. ESOPHAGEAL PHASE: (Screening of the upper esophagus) An esophageal sweep in the upright position with liquid and solid  consistencies showed esophageal retention with retrograde flow within the  esophagus, but not through the PES.   ASSESSMENT: The patient was seen today for an MBS and is presenting with minimal pharyngeal dysphagia with moderate-severe behavioral / cognitive issues increasing her risk for aspiration.  The patient consistently demonstrates oral hold of all boluses presented by spoon (there is no posterior drip into the pharynx during this oral hold).  She did respond to verbal cues to swallow.   Timing of the pharyngeal swallow is delayed, triggering while falling from the valleculae to the pyriform sinuses.  With cup rim "gulps", the patient demonstrates transient laryngeal penetration without aspiration.  With straw sips, the patient demonstrates inconsistent transient laryngeal penetration.  An esophageal sweep in the upright position with liquid and solid  consistencies showed esophageal retention with retrograde flow within the  esophagus, but not through the PES.  Recommend that the patient resume her usual diet with constant supervision to discourage oral holding of food/liquid.  She would benefit from SLP clinical swallow assessment in her usual environment to develop feeding/swallowing guidelines.  In addition, strict reflux precautions are recommended.    PLAN/RECOMMENDATIONS:  A. Diet: Usual diet with supervision to curb impulsive over filling the oral cavity and verbal cues to swallow each bite.   B. Swallowing Precautions: Swallow each bite before taking another; may use straw but discourage more than 2 pulls at a time (or limit quantity available to be swallowed), strict reflux precautions- stay upright  after meals for 90 minutes, minimize food/liquid 2-3 hours before bed (trying to avoid nocturnal reflux), elevate the head of the bed.   C. Recommended consultation to:   D. Therapy recommendations: SLP in home evaluation to develop effective and safe feeding/swallowing guidelines   E. Results and recommendations were discussed with the patient's mother and primary care giver.  Will FAX report to group home.            No flowsheet data found.  Leroy Sea, MS/CCC- SLP   Lou Miner 05/22/2015, 12:02 PM

## 2015-05-22 NOTE — Care Management (Signed)
Admitted to [facility] with the diagnosis of acute respiratory failure. Lives at [place] x 3 years. Mother is Tawona Dever [phone]). Dr. Rosanna Randy is listed as primary care physician. Wheelchair bound since hip problems. Scheduled for Hip replacement surgery 05/19/15, but cancelled due to respiratory distress. Shelbie Ammons RN MSN CCM Care Management [phone]

## 2015-05-22 NOTE — Progress Notes (Signed)
PT Cancellation Note  Patient Details Name: Helen Patton MRN: [id number] DOB: September 19, 1960   Cancelled Treatment:    Reason Eval/Treat Not Completed: Medical issues which prohibited therapy (Consult received and chart reviewed.  Per MD, patient preparing to leave unit for chest CT; recommends therapy hold at this time and re-attempt next date as medically appropriate.)   Alece Koppel H. Owens Shark, PT, DPT, NCS 05/22/2015, 1:39 PM [phone]

## 2015-05-22 NOTE — Progress Notes (Signed)
Pharmacy Antibiotic Note  Helen Patton is a 55 y.o. female admitted on 05/19/2015 with aspiration pneumonia.  Pharmacy has been consulted for Unasyn dosing. Patient was previously on Zosyn.   Plan: Unasyn 3 g iv q 6 hours.   Height: 4\' 11"  (149.9 cm) Weight: 147 lb (66.679 kg) IBW/kg (Calculated) : 43.2  Temp (24hrs), Avg:98.7 F (37.1 C), Min:98 F (36.7 C), Max:99.1 F (37.3 C)   Recent Labs Lab 05/19/15 1052 05/20/15 0548 05/21/15 0524  WBC 12.4* 7.6  --   CREATININE 0.56 0.67 0.58    Estimated Creatinine Clearance: 64.4 mL/min (by C-G formula based on Cr of 0.58).    Allergies  Allergen Reactions  . Sulfa Antibiotics Other (See Comments)    "Does not know"  . Keflex [Cephalexin] Other (See Comments)    "Does not know" unknown    Antimicrobials this admission: Levaquin 4/7 >> 4/8 Zosyn 4/7 >> 4/10 Unasyn 4/10 >>  Dose adjustments this admission:   Microbiology results: 4/7 BCx: NGTD x 2   Thank you for allowing pharmacy to be a part of this patient's care.  Ulice Dash D 05/22/2015 4:16 PM

## 2015-05-22 NOTE — Progress Notes (Signed)
[facility] [place] NAME: Helen Patton    MR#:  [id number]  DATE OF BIRTH:  02-02-1961  SUBJECTIVE:  CHIEF COMPLAINT:   Chief Complaint  Patient presents with  . Cough  . 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  REVIEW OF SYSTEMS:  Review of Systems  Unable to perform ROS: psychiatric disorder    DRUG ALLERGIES:   Allergies  Allergen Reactions  . Sulfa Antibiotics Other (See Comments)    "Does not know"  . Keflex [Cephalexin] Other (See Comments)    "Does not know" unknown    VITALS:  Blood pressure 110/52, pulse 91, temperature 98.7 F (37.1 C), temperature source Oral, resp. rate 20, height 4\' 11"  (1.499 m), weight 66.679 kg (147 lb), SpO2 96 %.  PHYSICAL EXAMINATION:  Physical Exam  GENERAL:  55 y.o.-year-old patient lying in the bed with no acute distress. Restless in bed EYES: Pupils equal, round, reactive to light and accommodation. No scleral icterus. Extraocular muscles intact.  HEENT: Head microcephalic,atraumatic, normocephalic. Oropharynx and nasopharynx clear. Poor dentition NECK:  Supple, no jugular venous distention. No thyroid enlargement, no tenderness.  LUNGS: Normal breath sounds bilaterally, no wheezing, rales,rhonchi or crepitation. No use of accessory muscles of respiration.  CARDIOVASCULAR: S1, S2 normal. No rubs, or gallops. Soft 3/6 systolic murmur present.  ABDOMEN: Soft, nontender, nondistended. Bowel sounds present. No organomegaly or mass.  EXTREMITIES: No pedal edema, cyanosis, or clubbing.  NEUROLOGIC: moving both arms in bed, sometimes following commands, speech not very clear. At baseline per Mother and group home supervisor PSYCHIATRIC: The patient is alert, at baseline.  SKIN: No obvious rash, lesion, or ulcer.    LABORATORY PANEL:   CBC  Recent Labs Lab 05/20/15 0548  WBC 7.6  HGB 11.6*  HCT 34.7*   PLT 278   ------------------------------------------------------------------------------------------------------------------  Chemistries   Recent Labs Lab 05/21/15 0524  NA 138  K 3.5  CL 102  CO2 32  GLUCOSE 106*  BUN 20  CREATININE 0.58  CALCIUM 8.8*   ------------------------------------------------------------------------------------------------------------------  Cardiac Enzymes No results for input(s): TROPONINI in the last 168 hours. ------------------------------------------------------------------------------------------------------------------  RADIOLOGY:  Ct Angio Chest Pe W/cm &/or Wo Cm  05/22/2015  CLINICAL DATA:  Cough, hypoxia, pneumonia, history breast cancer, hypertension EXAM: CT ANGIOGRAPHY CHEST WITH CONTRAST TECHNIQUE: Multidetector CT imaging of the chest was performed using the standard protocol during bolus administration of intravenous contrast. Multiplanar CT image reconstructions and MIPs were obtained to evaluate the vascular anatomy. CONTRAST:  75 cc Isovue 370 IV COMPARISON:  None FINDINGS: Atherosclerotic calcifications aorta and coronary arteries. No aortic aneurysm or dissection identified. Scattered respiratory motion artifacts particularly lower lobes. Pulmonary arteries well opacified and, withinin limitations of motion, grossly patent. No evidence of pulmonary embolism. Calcified mediastinal and hilar lymph nodes compatible with old granulomatous disease. Dilated esophagus with air-fluid level extending to a small hiatal hernia. Visualized upper abdomen normal appearance. Scattered peribronchial thickening bilaterally. Minimal dependent atelectasis LEFT lower lobe. Mild patchy airspace infiltrates in RIGHT upper lobe suspicious for pneumonia. Fluid filled bronchi and RIGHT lower lobe. No pleural effusion or pneumothorax. Bones appear demineralized with old appearing height losses of adjacent mid thoracic vertebra. Review of the MIP images confirms  the above findings. IMPRESSION: No evidence pulmonary embolism. Mild patchy infiltrate RIGHT upper lobe with scattered bronchitic changes and question mucoid impaction in RIGHT lower lobe. Small hiatal hernia  with distended thoracic esophagus. Old granulomatous disease. Electronically Signed   By: Lavonia Dana M.D.   On: 05/22/2015 14:55   Dg Chest Port 1 View  05/22/2015  CLINICAL DATA:  55 y.o. female with a known history of microcephaly and mental retardation. The patient is nonverbal. The patient was scheduled to have a right hip revision by [name]. The procedure was canceled secondary to hypoxia. When the patient was placed on oxygen then she started coughing. A chest x-ray revealed a right upper lobe pneumonia. EXAM: PORTABLE CHEST 1 VIEW COMPARISON:  05/20/2015 FINDINGS: Cardiac is normal in size. No mediastinal or hilar masses or evidence of adenopathy. There are several calcified hilar and AP window lymph nodes, stable. Clear lungs.  No pleural effusion or pneumothorax. Bony thorax intact. There is residual contrast in the stomach. IMPRESSION: 1. No acute cardiopulmonary disease.  No evidence of pneumonia. Electronically Signed   By: Lajean Manes M.D.   On: 05/22/2015 12:00    EKG:   Orders placed or performed during the hospital encounter of 09/26/14  . ED EKG  . ED EKG  . EKG    ASSESSMENT AND PLAN:   55 year old female with past medical history significant for microcephaly, mental retardation, hypertension, seizure disorder presented from group home to get an elective right hip surgery but was noted to be hypoxic.  #1 Acute hypoxia- secondary to aspiration pneumonia-right upper lobe infiltrate. - CT chest negative for PE, but has RUL infiltrate and RLL mucus in bronchi - still needing about 2l o2- wean as tolerated - encourage sitting in a chair, incentive spirometry if able to do it. - Appreciate speech therapy consult. Mostly due to her impulsive behavior and sometimes tries  to eat a larger bouts. - MBSS today ad continued recommendation for honey thick liquid and pured diet. - Change ABX to unasyn- change to augmentin at discharge, CXR with improvement -Wean O2 as tolerated.  #2 right hip pain-due to avascular necrosis. Prior right femoral neck fracture surgery done on the same side. -Surgery was supposed to be scheduled 05/19/15 however canceled due to her hypoxemia. -reschedule as outpatient  #3 hypertension-continue Cozaar and Norvasc  #4 seizure disorder-on Lamictal  #5 history of breast cancer-stable. On letrozole.  #6 DVT prophylaxis-Lovenox     All the records are reviewed and case discussed with Care Management/Social Workerr. Management plans discussed with the patient, family and they are in agreement.  CODE STATUS: Full Code  TOTAL TIME TAKING CARE OF THIS PATIENT: 37 minutes.   POSSIBLE D/C IN 1 DAYS, DEPENDING ON CLINICAL CONDITION.   Gladstone Lighter M.D on 05/22/2015 at 3:19 PM  Between 7am to 6pm - Pager - [phone]  After 6pm go to [email or web address] - password [email or web address] [place] [facility]  Office  [phone]  CC: Primary care physician; Wilhemena Durie, MD

## 2015-05-22 NOTE — Care Management Important Message (Signed)
Important Message  Patient Details  Name: Helen Patton MRN: [id number] Date of Birth: 1960-09-11   Medicare Important Message Given:  Yes    Juliann Pulse A Amia Rynders 05/22/2015, 1:42 PM

## 2015-05-22 NOTE — Clinical Social Work Note (Signed)
Clinical Social Work Assessment  Patient Details  Name: Helen Patton MRN: [id number] Date of Birth: [birth year]  Date of referral:  05/22/15               Reason for consult:  Facility Placement                Permission sought to share information with:  Family Supports Permission granted to share information::  Yes, Verbal Permission Granted  Name::     Helen Patton   Housing/Transportation Living arrangements for the past 2 months:  [place] of Information:  Guardian Patient Interpreter Needed:  None Criminal Activity/Legal Involvement Pertinent to Current Situation/Hospitalization:  No - Comment as needed Significant Relationships:  Parents Lives with:  Facility Resident Do you feel safe going back to the place where you live?  Yes Need for family participation in patient care:  Yes (Comment)  Care giving concerns: No care giving concerns identified.   Social Worker assessment / plan:  CSW met with pt's mother to address consult. CSW introduced herself and explained role of social work. CSW also explained the process of discharging and returning to facility. Pt was scheduled to have hip surgery and would go to [facility] for STR, however it was cancelled due to respiratory status. Pt is from [facility] and is able to return to facility if they are able to manage pt's care. CSW followed up with RN at [name] who will come to evaluate pt prior to return. CSW updated pt's mother regarding all of this. CSW will continue to follow.   Employment status:  Disabled (Comment on whether or not currently receiving Disability) Insurance information:  Medicaid In [place], [place] PT Recommendations:  Not assessed at this time Information / Referral to community resources:  Other (Comment Required) [name])  Patient/Family's Response to care:  Pt's mother was appreciative of CSW support.   Patient/Family's Understanding of and Emotional Response to  Diagnosis, Current Treatment, and Prognosis:  Pt's mother understands that pt is unable to have hip surgery, however pt is to return to [name] while hip surgery is being rescheduled.  Emotional Assessment Appearance:  Appears stated age Attitude/Demeanor/Rapport:  Other Affect (typically observed):  Other Orientation:  Oriented to Self Alcohol / Substance use:   None Psych involvement (Current and /or in the community):  No (Comment)  Discharge Needs  Concerns to be addressed:  Adjustment to Illness Readmission within the last 30 days:  No Current discharge risk:  Chronically ill Barriers to Discharge:  Continued Medical Work up   [employer], LCSW 05/22/2015, 5:14 PM

## 2015-05-22 NOTE — Clinical Social Work Note (Signed)
CSW consulted as pt was ready for discharge and was from [facility]. However, discharge was discontinued, per MD. Assessment complete, completed note to follow. CSW will continue to follow.   Darden Dates, MSW, LCSW  Clinical Social Worker  [phone]

## 2015-05-22 NOTE — Consult Note (Signed)
   [facility] CM Inpatient Consult   05/22/2015  Sumitra Deckert [facility] [birth year] [id number]  Patient screened for potential [place] Management services. Patient is eligible for [place]. Epic reveals patient's there were no identifiable [facility] care management needs at this time related to patient being a resident of [name] group [facility]. [facility] Care Management services not appropriate at this time. If patient's post hospital needs change please place a [facility] Care Management consult. For questions please contact:   Malky Rudzinski RN, [facility] Liaison  [phone]) Business Mobile [phone]) Toll free office

## 2015-05-22 NOTE — Care Management (Signed)
No qualifying diagnosis for home oxygen at this time. CT of chest ordered. Per Dr. Tressia Miners. Shelbie Ammons RN MSN CCM Care Management [phone]

## 2015-05-23 ENCOUNTER — Inpatient Hospital Stay
Admission: RE | Admit: 2015-05-23 | Discharge: 2015-05-23 | Disposition: A | Discharge status: Home / Self Care | Payer: Medicare Other | Source: Ambulatory Visit | Attending: Internal Medicine | Admitting: Internal Medicine

## 2015-05-23 LAB — BLOOD GAS, ARTERIAL
ALLENS TEST (PASS/FAIL): POSITIVE — AB
Acid-Base Excess: 7.9 mmol/L — ABNORMAL HIGH (ref 0.0–3.0)
Bicarbonate: 33.3 mEq/L — ABNORMAL HIGH (ref 21.0–28.0)
FIO2: 0.21
O2 SAT: 81.6 %
PCO2 ART: 49 mmHg — AB (ref 32.0–48.0)
PH ART: 7.44 (ref 7.350–7.450)
PO2 ART: 44 mmHg — AB (ref 83.0–108.0)
Patient temperature: 37

## 2015-05-23 LAB — ECHOCARDIOGRAM COMPLETE
HEIGHTINCHES: 59 in
Weight: 2352 oz

## 2015-05-23 LAB — CBC
HEMATOCRIT: 34.7 % — AB (ref 35.0–47.0)
HEMOGLOBIN: 11.6 g/dL — AB (ref 12.0–16.0)
MCH: 30.4 pg (ref 26.0–34.0)
MCHC: 33.4 g/dL (ref 32.0–36.0)
MCV: 91.1 fL (ref 80.0–100.0)
Platelets: 302 10*3/uL (ref 150–440)
RBC: 3.81 MIL/uL (ref 3.80–5.20)
RDW: 14.3 % (ref 11.5–14.5)
WBC: 8.3 10*3/uL (ref 3.6–11.0)

## 2015-05-23 MED ORDER — LORAZEPAM 0.5 MG PO TABS
0.5000 mg | ORAL_TABLET | Freq: Three times a day (TID) | ORAL | Status: DC | PRN
  Administered 2015-05-23: 0.5 mg via ORAL
  Filled 2015-05-23: qty 1

## 2015-05-23 MED ORDER — POLYETHYLENE GLYCOL 3350 17 G PO PACK
17.0000 g | PACK | Freq: Every day | ORAL | Status: DC
  Administered 2015-05-23 – 2015-05-24 (×2): 17 g via ORAL
  Filled 2015-05-23 (×2): qty 1

## 2015-05-23 MED ORDER — FUROSEMIDE 10 MG/ML IJ SOLN
40.0000 mg | Freq: Once | INTRAMUSCULAR | Status: AC
  Administered 2015-05-23: 40 mg via INTRAVENOUS
  Filled 2015-05-23: qty 4

## 2015-05-23 MED ORDER — AMOXICILLIN-POT CLAVULANATE 875-125 MG PO TABS
1.0000 | ORAL_TABLET | Freq: Two times a day (BID) | ORAL | Status: DC
  Administered 2015-05-23 – 2015-05-24 (×3): 1 via ORAL
  Filled 2015-05-23 (×4): qty 1

## 2015-05-23 MED ORDER — BISACODYL 10 MG RE SUPP
10.0000 mg | Freq: Every day | RECTAL | Status: DC | PRN
  Filled 2015-05-23: qty 1

## 2015-05-23 NOTE — Progress Notes (Signed)
*  PRELIMINARY RESULTS* Echocardiogram 2D Echocardiogram has been performed.  Helen Patton 05/23/2015, 12:19 PM

## 2015-05-23 NOTE — Progress Notes (Signed)
[place] at [place] NAME: Helen Patton    MR#:  [id number]  DATE OF BIRTH:  [birth year]  SUBJECTIVE:  CHIEF COMPLAINT:   Chief Complaint  Patient presents with  . Cough  . 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 Sulfa Antibiotics Other (See Comments)    "Does not know"  . Keflex [Cephalexin] Other (See Comments)    "Does not know" unknown    VITALS:  Blood pressure 112/56, pulse 95, temperature 98.6 F (37 C), temperature source Oral, resp. rate 18, height 4\' 11"  (1.499 m), weight 66.679 kg (147 lb), SpO2 95 %.  PHYSICAL EXAMINATION:  Physical Exam  GENERAL:  55 y.o.-year-old patient lying in the bed with no acute distress. Restless in bed EYES: Pupils equal, round, reactive to light and accommodation. No scleral icterus. Extraocular muscles intact.  HEENT: Head microcephalic,atraumatic, normocephalic. Oropharynx and nasopharynx clear. Poor dentition NECK:  Supple, no jugular venous distention. No thyroid enlargement, no tenderness.  LUNGS: Normal breath sounds bilaterally, no wheezing, rales,rhonchi or crepitation. No use of accessory muscles of respiration.  CARDIOVASCULAR: S1, S2 normal. No rubs, or gallops. Soft 3/6 systolic murmur present.  ABDOMEN: Soft, nontender, nondistended. Bowel sounds present. No organomegaly or mass.  EXTREMITIES: No pedal edema, cyanosis, or clubbing.  NEUROLOGIC: moving both arms in bed, sometimes following commands, speech not very clear. At baseline per Mother and group home supervisor PSYCHIATRIC: The patient is alert, at baseline.  SKIN: No obvious rash, lesion, or ulcer.    LABORATORY PANEL:   CBC  Recent Labs Lab 05/23/15 0454  WBC 8.3  HGB  11.6*  HCT 34.7*  PLT 302   ------------------------------------------------------------------------------------------------------------------  Chemistries   Recent Labs Lab 05/21/15 0524  NA 138  K 3.5  CL 102  CO2 32  GLUCOSE 106*  BUN 20  CREATININE 0.58  CALCIUM 8.8*   ------------------------------------------------------------------------------------------------------------------  Cardiac Enzymes No results for input(s): TROPONINI in the last 168 hours. ------------------------------------------------------------------------------------------------------------------  RADIOLOGY:  Ct Angio Chest Pe W/cm &/or Wo Cm  05/22/2015  CLINICAL DATA:  Cough, hypoxia, pneumonia, history breast cancer, hypertension EXAM: CT ANGIOGRAPHY CHEST WITH CONTRAST TECHNIQUE: Multidetector CT imaging of the chest was performed using the standard protocol during bolus administration of intravenous contrast. Multiplanar CT image reconstructions and MIPs were obtained to evaluate the vascular anatomy. CONTRAST:  75 cc Isovue 370 IV COMPARISON:  None FINDINGS: Atherosclerotic calcifications aorta and coronary arteries. No aortic aneurysm or dissection identified. Scattered respiratory motion artifacts particularly lower lobes. Pulmonary arteries well opacified and, withinin limitations of motion, grossly patent. No evidence of pulmonary embolism. Calcified mediastinal and hilar lymph nodes compatible with old granulomatous disease. Dilated esophagus with air-fluid level extending to a small hiatal hernia. Visualized upper abdomen normal appearance. Scattered peribronchial thickening bilaterally. Minimal dependent atelectasis LEFT lower lobe. Mild patchy airspace infiltrates in RIGHT upper lobe suspicious for pneumonia. Fluid filled bronchi and RIGHT lower lobe. No pleural effusion or pneumothorax. Bones appear demineralized with old appearing height losses of adjacent mid thoracic vertebra. Review of the MIP  images confirms the above findings. IMPRESSION: No evidence pulmonary embolism. Mild patchy infiltrate RIGHT upper lobe with scattered bronchitic changes and question mucoid impaction in RIGHT lower lobe. Small hiatal  hernia with distended thoracic esophagus. Old granulomatous disease. Electronically Signed   By: Lavonia Dana M.D.   On: 05/22/2015 14:55   Dg Chest Port 1 View  05/22/2015  CLINICAL DATA:  55 y.o. female with a known history of microcephaly and mental retardation. The patient is nonverbal. The patient was scheduled to have a right hip revision by [name]. The procedure was canceled secondary to hypoxia. When the patient was placed on oxygen then she started coughing. A chest x-ray revealed a right upper lobe pneumonia. EXAM: PORTABLE CHEST 1 VIEW COMPARISON:  05/20/2015 FINDINGS: Cardiac is normal in size. No mediastinal or hilar masses or evidence of adenopathy. There are several calcified hilar and AP window lymph nodes, stable. Clear lungs.  No pleural effusion or pneumothorax. Bony thorax intact. There is residual contrast in the stomach. IMPRESSION: 1. No acute cardiopulmonary disease.  No evidence of pneumonia. Electronically Signed   By: Lajean Manes M.D.   On: 05/22/2015 12:00    EKG:   Orders placed or performed during the hospital encounter of 09/26/14  . ED EKG  . ED EKG  . EKG    ASSESSMENT AND PLAN:   55 year old female with past medical history significant for microcephaly, mental retardation, hypertension, seizure disorder presented from group home to get an elective right hip surgery but was noted to be hypoxic.  #1 Acute hypoxia- secondary to aspiration pneumonia-right upper lobe infiltrate. -Likely patient also has obesity hypoventilation syndrome. ABG showing significant hypoxia on room air. - CT chest negative for PE, but has RUL infiltrate and RLL mucus in bronchi - Continue 2 L oxygen. -Chest physiotherapy or percussion vest,- encourage sitting in a  chair, incentive spirometry if able to do it. - Appreciate speech therapy consult. Mostly due to her impulsive behavior and sometimes tries to eat a larger bouts. - MBSS done, reflux changes also noted. Diet changed to thin liquids and pured diet with aspiration precautions and assisted feeding. - Change ABX to unasyn- change to augmentin -Wean O2 as tolerated. -Check echocardiogram to rule out any diastolic dysfunction  #2 right hip pain-due to avascular necrosis. Prior right femoral neck fracture surgery done on the same side. -Surgery was supposed to be scheduled 05/19/15 however canceled due to her hypoxemia. -reschedule as outpatient  #3 hypertension-continue Cozaar and Norvasc  #4 seizure disorder-on Lamictal  #5 history of breast cancer-stable. On letrozole.  #6 DVT prophylaxis-Lovenox  #7 Constipation- meds adjusted today  Likely discharge to Group home in 1-2 days Updated Mother at bedside   All the records are reviewed and case discussed with Care Management/Social Workerr. Management plans discussed with the patient, family and they are in agreement.  CODE STATUS: Full Code  TOTAL TIME TAKING CARE OF THIS PATIENT: 37 minutes.   POSSIBLE D/C IN 1-2 DAYS, DEPENDING ON CLINICAL CONDITION.   Gladstone Lighter M.D on 05/23/2015 at 1:38 PM  Between 7am to 6pm - Pager - [phone]  After 6pm go to [email or web address] - password [email or web address] [place] [facility]  Office  [phone]  CC: Primary care physician; Wilhemena Durie, MD

## 2015-05-23 NOTE — Progress Notes (Signed)
Speech Language Pathology Treatment: Dysphagia  Patient Details Name: Helen Patton MRN: [id number] DOB: 25-Apr-1960 Today's Date: 05/23/2015 Time: 0830-0930 SLP Time Calculation (min) (ACUTE ONLY): 60 min  Assessment / Plan / Recommendation Clinical Impression  Pt appeared to adequately tolerate trials of thin liquids via straw w/ monitored amount of liquid in a cup at a time to reduce the amount of liquid the pt can take in, "gulp", at a time - pt consumes liquids (and foods) in an impulsive manner increasing her risk for aspiration. A straw was used in order to keep pt's head level and/or looking down for the straw as she tends to tilt the cup and look up to finish drinking the last of a drink from a cup. The looking up and gulping behaviors when drinking can increase risk for aspiration to occur. Pt did not demo. Any oral holding or delay in A-P transfer w/ trials of thin liquids and the 2-3 trials of puree. Pt refused further po trials of food and turned her head away - suspect the fact that pt has not had a bowel movement in ~4-5 days could be contributing to a "fullness" feeling and lack of desire to not eat. NSG informed of this. Mother will f/u as well. Much time was spent on education of aspiration precautions and feeding strategies to monitor/lessen impulsive behavior; support pt during feeding. ST will continue to f/u w/ toleration of po diet; education on strategies. Mother agreed.  Rec. A Dys. 2 diet w/ thin liquids at this time(foods cut, ground well); meds in puree - crushed as able. !00% supervision at all meals to facilitate, monitor.    HPI HPI: Pt is a 55 y.o. female with a known history of microcephaly and mental retardation. The patient is nonverbal. The patient was scheduled to have a right hip revision by [name]. The procedure was canceled secondary to hypoxia. When the patient was placed on oxygen then she started coughing. A chest x-ray revealed a right upper lobe  pneumonia. Hospitalist services were contacted for further evaluation. History obtained from old chart and family at the bedside. Patient has been wheelchair bound since her hip issues and has NOT had a bowel movement in ~4-5 days per Mother. During MBSS performed yesterday, timing of the pharyngeal swallow is delayed triggering while falling from the valleculae to the pyriform sinuses.With cup rim "gulps", the patient demonstrates transient laryngeal penetration without aspiration.With straw sips, the patient demonstrates inconsistent transient laryngeal penetration. An esophageal sweep in the upright position with liquid and solidconsistencies showed esophageal retention with retrograde flow within theesophagus. Pt does have the risk for aspiration of Reflux material.       SLP Plan  Continue with current plan of care     Recommendations  Diet recommendations: Dysphagia 2 (fine chop);Thin liquid Liquids provided via: Straw Medication Administration: Crushed with puree Supervision: Staff to assist with self feeding;Full supervision/cueing for compensatory strategies Compensations: Minimize environmental distractions;Slow rate;Small sips/bites;Follow solids with liquid (check for oral clearing) Postural Changes and/or Swallow Maneuvers: Seated upright 90 degrees;Upright 30-60 min after meal             General recommendations:  (Dietician consult) Oral Care Recommendations: Oral care BID;Staff/trained caregiver to provide oral care Follow up Recommendations: Skilled Nursing facility Plan: Continue with current plan of care     [place], [place], CCC-SLP  Watson,Katherine 05/23/2015, 5:48 PM

## 2015-05-23 NOTE — Progress Notes (Signed)
PT Cancellation Note  Patient Details Name: Helen Patton MRN: [id number] DOB: 04-08-1960   Cancelled Treatment:    Reason Eval/Treat Not Completed: Other (comment) (Evaluation re-attempted.  Mother at bedside states patient just had pain medication approx one hour prior and was just getting comfortable.  Requests therapist allow patient to rest peacefully at this time and re-attempt next date as appropriate.)   Of note, mother reports that prior to admission, patient was resident of [facility] home; is non-verbal at baseline, but does understand others "pretty well".  In recent months, has been largely WC bound due to progressive R hip pain, requiring assist from staff for all transfers to maintain safety and pain-free activity as able.  Unsure of exact transfer technique or whether patient typically uses assist device to assist with transfer.   Keni Elison H. Owens Shark, PT, DPT, NCS 05/23/2015, 4:10 PM [phone]

## 2015-05-24 ENCOUNTER — Telehealth: Payer: Self-pay | Admitting: Family Medicine

## 2015-05-24 LAB — BASIC METABOLIC PANEL
ANION GAP: 8 (ref 5–15)
BUN: 23 mg/dL — ABNORMAL HIGH (ref 6–20)
CALCIUM: 9.6 mg/dL (ref 8.9–10.3)
CO2: 33 mmol/L — AB (ref 22–32)
Chloride: 101 mmol/L (ref 101–111)
Creatinine, Ser: 0.65 mg/dL (ref 0.44–1.00)
GFR calc Af Amer: >60 mL/min (ref >60)
GFR calc non Af Amer: >60 mL/min (ref >60)
GLUCOSE: 127 mg/dL — AB (ref 65–99)
Potassium: 3.5 mmol/L (ref 3.5–5.1)
Sodium: 142 mmol/L (ref 135–145)

## 2015-05-24 MED ORDER — POLYETHYLENE GLYCOL 3350 17 G PO PACK: 17.0000 g | PACK | Freq: Every day | ORAL | Status: DC

## 2015-05-24 MED ORDER — BISACODYL 10 MG RE SUPP: 10.0000 mg | Freq: Every day | RECTAL | Status: DC | PRN

## 2015-05-24 MED ORDER — GUAIFENESIN ER 600 MG PO TB12: 600.0000 mg | ORAL_TABLET | Freq: Two times a day (BID) | ORAL | Status: DC

## 2015-05-24 NOTE — Discharge Summary (Signed)
[facility] [place] NAME: Helen Patton    MR#:  [id number]  DATE OF BIRTH:  [birth year]  DATE OF ADMISSION:  05/19/2015 ADMITTING PHYSICIAN: Loletha Grayer, MD  DATE OF DISCHARGE: 05/24/15  PRIMARY CARE PHYSICIAN: Wilhemena Durie, MD    ADMISSION DIAGNOSIS:  CAP (community acquired pneumonia) [J18.9]  DISCHARGE DIAGNOSIS:  Active Problems:   Acute respiratory failure with hypoxia ([place])   SECONDARY DIAGNOSIS:   Past Medical History  Diagnosis Date  . Mental retardation   . GERD (gastroesophageal reflux disease)   . Osteoporosis   . Hypertension   . Seizures ([place])   . MRSA (methicillin resistant staph aureus) culture positive 2012  . Hidradenitis 2012    MRSA, on chronic topical suppression  . Cancer [facility]) Oct 2015    invasive mammary carcinoma  . Breast cancer ([place]) 2015    [name]  . [place]     HOSPITAL COURSE:   55 year old female with past medical history significant for microcephaly, mental retardation, hypertension, seizure disorder presented from group home to get an elective right hip surgery but was noted to be hypoxic.  #1 Acute hypoxia- secondary to aspiration pneumonia-right upper lobe infiltrate. - CT chest negative for PE, but has RUL infiltrate and RLL mucus in bronchi -Chest physiotherapy helping her,- encourage sitting in a chair, incentive spirometry if able to do it. - Appreciate speech therapy consult. Mostly due to her impulsive behavior and sometimes tries to eat a larger bouts. GI f/u as outpatient - MBSS done, reflux changes also noted. Diet changed to thin liquids and pured diet with aspiration precautions and assisted feeding. Add prilosec - on augmentin, received unasyn in hospital - off o2 now. -ECHO normal EF  #2 right hip pain-due to avascular necrosis. Prior right femoral neck fracture surgery done on the same side. -Surgery was supposed to be scheduled 05/19/15  however canceled due to her hypoxemia. -reschedule as outpatient- discussed with [name]  #3 hypertension-continue Cozaar and Norvasc  #4 seizure disorder-on Lamictal  #5 history of breast cancer-stable. On letrozole.  #6 Constipation- meds adjusted  Discharge to group home today.  DISCHARGE CONDITIONS:   Guarded  CONSULTS OBTAINED:   None  DRUG ALLERGIES:   Allergies  Allergen Reactions  . Sulfa Antibiotics Other (See Comments)    "Does not know"  . Keflex [Cephalexin] Other (See Comments)    "Does not know" unknown    DISCHARGE MEDICATIONS:   Current Discharge Medication List    START taking these medications   Details  amoxicillin-clavulanate (AUGMENTIN) 875-125 MG tablet Take 1 tablet by mouth 2 (two) times daily. X 8 days Qty: 16 tablet, Refills: 0    bisacodyl (DULCOLAX) 10 MG suppository Place 1 suppository (10 mg total) rectally daily as needed for moderate constipation. Qty: 12 suppository, Refills: 0    guaiFENesin (MUCINEX) 600 MG 12 hr tablet Take 1 tablet (600 mg total) by mouth 2 (two) times daily. Qty: 30 tablet, Refills: 0    polyethylene glycol (MIRALAX / GLYCOLAX) packet Take 17 g by mouth daily. Qty: 14 each, Refills: 0      CONTINUE these medications which have CHANGED   Details  HYDROcodone-acetaminophen (NORCO/VICODIN) 5-325 MG tablet Take 1 tablet by mouth every 6 (six) hours as needed for moderate pain. Qty: 30 tablet, Refills: 0    LORazepam (ATIVAN) 1 MG tablet Take 1 tablet (1 mg total) by mouth every 8 (eight) hours as needed (take  one tablet by mouth as needed for seizure lasting longer than 3 min.  or more than 2 seizures in 2 hours). Qty: 25 tablet, Refills: 0      CONTINUE these medications which have NOT CHANGED   Details  alendronate (FOSAMAX) 70 MG tablet 1 TAB PO WEEKLY EARLY AM BEFORE FOOD/MEDS W/WATER. DON'T LIE DOWN FOR30 MIN (OSTEOPOROSIS) *NO CRUSH* Qty: 5 tablet, Refills: 12    amLODipine (NORVASC) 10 MG  tablet Take 1 tablet (10 mg total) by mouth daily. Qty: 30 tablet, Refills: 12    BENZOYL PEROXIDE 5 % external wash USE TO [facility] AFFECTED AREA DAILY Qty: 237 g, Refills: 0    calcium-vitamin D (OSCAL WITH D) 500-200 MG-UNIT per tablet Take 1 tablet by mouth daily with breakfast. Qty: 30 tablet, Refills: 12    clindamycin (CLEOCIN T) 1 % external solution Apply topically daily. Qty: 60 mL, Refills: 12    clotrimazole (LOTRIMIN) 1 % cream Apply 1 application topically 2 (two) times daily. For rash in skin folds    docusate sodium (COLACE) 100 MG capsule Take 1 capsule (100 mg total) by mouth 2 (two) times daily. Qty: 60 capsule, Refills: 12    DULoxetine (CYMBALTA) 60 MG capsule Take 1 capsule (60 mg total) by mouth daily. Qty: 30 capsule, Refills: 12    lamoTRIgine (LAMICTAL) 150 MG tablet Take 300 mg by mouth 2 (two) times daily.     letrozole (FEMARA) 2.5 MG tablet Take 2.5 mg by mouth daily.    loratadine (CLARITIN) 10 MG tablet Take 1 tablet (10 mg total) by mouth daily. Qty: 30 tablet, Refills: 12    losartan (COZAAR) 100 MG tablet Take 1 tablet (100 mg total) by mouth daily. Qty: 30 tablet, Refills: 12    meloxicam (MOBIC) 7.5 MG tablet Take 7.5 mg by mouth 2 (two) times daily. With meals    omeprazole (PRILOSEC) 20 MG capsule TAKE 1 CAPSULE BY MOUTH TWICE DAILY FOR GERD. (DO NOT CRUSH) Qty: 60 capsule, Refills: 12    terbinafine (LAMISIL) 1 % cream Apply to affected area 2 times daily until clear and then 2 times a week for suppression Qty: 30 g, Refills: 2         DISCHARGE INSTRUCTIONS:   1. PCP f/u in 1-2 weeks 2. Chest physiotherapy   If you experience worsening of your admission symptoms, develop shortness of breath, life threatening emergency, suicidal or homicidal thoughts you must seek medical attention immediately by calling 911 or calling your MD immediately  if symptoms less severe.  You Must read complete instructions/literature along with all the  possible adverse reactions/side effects for all the Medicines you take and that have been prescribed to you. Take any new Medicines after you have completely understood and accept all the possible adverse reactions/side effects.   Please note  You were cared for by a hospitalist during your hospital stay. If you have any questions about your discharge medications or the care you received while you were in the hospital after you are discharged, you can call the unit and asked to speak with the hospitalist on call if the hospitalist that took care of you is not available. Once you are discharged, your primary care physician will handle any further medical issues. Please note that NO REFILLS for any discharge medications will be authorized once you are discharged, as it is imperative that you return to your primary care physician (or establish a relationship with a primary care physician if you do  not have one) for your aftercare needs so that they can reassess your need for medications and monitor your lab values.    Today   CHIEF COMPLAINT:   Chief Complaint  Patient presents with  . Cough  . Pneumonia    VITAL SIGNS:  Blood pressure 128/54, pulse 95, temperature 98.2 F (36.8 C), temperature source Oral, resp. rate 20, height 4\' 11"  (1.499 m), weight 66.679 kg (147 lb), SpO2 94 %.  I/O:   Intake/Output Summary (Last 24 hours) at 05/24/15 1110 Last data filed at 05/24/15 0830  Gross per 24 hour  Intake    240 ml  Output      0 ml  Net    240 ml    PHYSICAL EXAMINATION:   Physical Exam  GENERAL: 55 y.o.-year-old patient lying in the bed with no acute distress. Restless in bed EYES: Pupils equal, round, reactive to light and accommodation. No scleral icterus. Extraocular muscles intact.  HEENT: Head microcephalic,atraumatic, normocephalic. Oropharynx and nasopharynx clear. Poor dentition NECK: Supple, no jugular venous distention. No thyroid enlargement, no tenderness.   LUNGS: Normal breath sounds bilaterally, no wheezing, rales,rhonchi or crepitation. No use of accessory muscles of respiration.  CARDIOVASCULAR: S1, S2 normal. No rubs, or gallops. Soft 3/6 systolic murmur present.  ABDOMEN: Soft, nontender, nondistended. Bowel sounds present. No organomegaly or mass.  EXTREMITIES: No pedal edema, cyanosis, or clubbing.  NEUROLOGIC: moving both arms in bed, sometimes following commands, speech not very clear. At baseline per Mother and group home supervisor PSYCHIATRIC: The patient is alert, at baseline.  SKIN: No obvious rash, lesion, or ulcer.   DATA REVIEW:   CBC  Recent Labs Lab 05/23/15 0454  WBC 8.3  HGB 11.6*  HCT 34.7*  PLT 302    Chemistries   Recent Labs Lab 05/24/15 0953  NA 142  K 3.5  CL 101  CO2 33*  GLUCOSE 127*  BUN 23*  CREATININE 0.65  CALCIUM 9.6    Cardiac Enzymes No results for input(s): TROPONINI in the last 168 hours.  Microbiology Results  Results for orders placed or performed during the hospital encounter of 05/19/15  CULTURE, BLOOD (ROUTINE X 2) w Reflex to PCR ID Panel     Status: None (Preliminary result)   Collection Time: 05/19/15 11:30 AM  Result Value Ref Range Status   Specimen Description BLOOD RIGHT ARM  Final   Special Requests BOTTLES DRAWN AEROBIC AND ANAEROBIC  3CC  Final   Culture NO GROWTH 3 DAYS  Final   Report Status PENDING  Incomplete  CULTURE, BLOOD (ROUTINE X 2) w Reflex to PCR ID Panel     Status: None (Preliminary result)   Collection Time: 05/19/15 11:31 AM  Result Value Ref Range Status   Specimen Description BLOOD RIGHT THUMB  Final   Special Requests BOTTLES DRAWN AEROBIC AND ANAEROBIC  1CC  Final   Culture NO GROWTH 3 DAYS  Final   Report Status PENDING  Incomplete    RADIOLOGY:  Ct Angio Chest Pe W/cm &/or Wo Cm  05/22/2015  CLINICAL DATA:  Cough, hypoxia, pneumonia, history breast cancer, hypertension EXAM: CT ANGIOGRAPHY CHEST WITH CONTRAST TECHNIQUE:  Multidetector CT imaging of the chest was performed using the standard protocol during bolus administration of intravenous contrast. Multiplanar CT image reconstructions and MIPs were obtained to evaluate the vascular anatomy. CONTRAST:  75 cc Isovue 370 IV COMPARISON:  None FINDINGS: Atherosclerotic calcifications aorta and coronary arteries. No aortic aneurysm or dissection identified. Scattered respiratory motion  artifacts particularly lower lobes. Pulmonary arteries well opacified and, withinin limitations of motion, grossly patent. No evidence of pulmonary embolism. Calcified mediastinal and hilar lymph nodes compatible with old granulomatous disease. Dilated esophagus with air-fluid level extending to a small hiatal hernia. Visualized upper abdomen normal appearance. Scattered peribronchial thickening bilaterally. Minimal dependent atelectasis LEFT lower lobe. Mild patchy airspace infiltrates in RIGHT upper lobe suspicious for pneumonia. Fluid filled bronchi and RIGHT lower lobe. No pleural effusion or pneumothorax. Bones appear demineralized with old appearing height losses of adjacent mid thoracic vertebra. Review of the MIP images confirms the above findings. IMPRESSION: No evidence pulmonary embolism. Mild patchy infiltrate RIGHT upper lobe with scattered bronchitic changes and question mucoid impaction in RIGHT lower lobe. Small hiatal hernia with distended thoracic esophagus. Old granulomatous disease. Electronically Signed   By: Lavonia Dana M.D.   On: 05/22/2015 14:55   Dg Chest Port 1 View  05/22/2015  CLINICAL DATA:  55 y.o. female with a known history of microcephaly and mental retardation. The patient is nonverbal. The patient was scheduled to have a right hip revision by [name]. The procedure was canceled secondary to hypoxia. When the patient was placed on oxygen then she started coughing. A chest x-ray revealed a right upper lobe pneumonia. EXAM: PORTABLE CHEST 1 VIEW COMPARISON:   05/20/2015 FINDINGS: Cardiac is normal in size. No mediastinal or hilar masses or evidence of adenopathy. There are several calcified hilar and AP window lymph nodes, stable. Clear lungs.  No pleural effusion or pneumothorax. Bony thorax intact. There is residual contrast in the stomach. IMPRESSION: 1. No acute cardiopulmonary disease.  No evidence of pneumonia. Electronically Signed   By: Lajean Manes M.D.   On: 05/22/2015 12:00    EKG:   Orders placed or performed during the hospital encounter of 09/26/14  . ED EKG  . ED EKG  . EKG      Management plans discussed with the patient, family and they are in agreement.  CODE STATUS:     Code Status Orders        Start     Ordered   05/19/15 1129  Full code   Continuous     05/19/15 1129    Code Status History    Date Active Date Inactive Code Status Order ID Comments User Context   This patient has a current code status but no historical code status.      TOTAL TIME TAKING CARE OF THIS PATIENT: 36 minutes.    Gladstone Lighter M.D on 05/24/2015 at 11:10 AM  Between 7am to 6pm - Pager - [phone]  After 6pm go to [email or web address] - password [email or web address] [facility]  Office  [phone]  CC: Primary care physician; Wilhemena Durie, MD

## 2015-05-24 NOTE — Discharge Instructions (Signed)
°  Diet and Recs. For Discharge: Dysphagia 2 w/ Thin liquids; Aspiration precautions and 100% monitoring at all meals to reduce impulsive eating/drinking("gulping") behaviors. Break up drinks into 3-4 parts; pt should use straw when drinking to keep head forward and looking down. Please add extra gravy to meats, potatoes. Yogurt TID meals. Reduce distractions during meals.  Meds Whole (as able) in Puree w/ NSG.

## 2015-05-24 NOTE — NC FL2 (Signed)
[place] LEVEL OF CARE SCREENING TOOL     IDENTIFICATION  Patient Name: Helen Patton Birthdate: [birth year] Sex: female Admission Date (Current Location): 05/19/2015  [facility] and [place] Number:  Engineering geologist and Address:  [facility], [address] [place], [place] [address]      Provider Number: [id number]  Attending Physician Name and Address:  Gladstone Lighter, MD  Relative Name and Phone Number:       Current Level of Care: Hospital Recommended Level of Care: [place] Prior Approval Number:    Date Approved/Denied:   PASRR Number:    Discharge Plan: Domiciliary (Rest home)    Current Diagnoses: Patient Active Problem List   Diagnosis Date Noted  . Acute respiratory failure with hypoxia ([place]) 05/19/2015  . Anxiety 12/14/2014  . Arthralgia of hip 12/14/2014  . Athlete's foot 12/14/2014  . Cerebral palsy ([place]) 12/14/2014  . Cognitive decline 12/14/2014  . Clinical depression 12/14/2014  . Acid reflux 12/14/2014  . HLD (hyperlipidemia) 12/14/2014  . BP (high blood pressure) 12/14/2014  . Methicillin resistant Staphylococcus aureus infection 12/14/2014  . Adiposity 12/14/2014  . Breast cancer ([place]) 11/21/2013  . Mental retardation 11/16/2013  . Seizure ([place]) 09/30/2013    Orientation RESPIRATION BLADDER Height & Weight     Self  Normal Incontinent Weight: 147 lb (66.679 kg) Height:  4\' 11"  (149.9 cm)  BEHAVIORAL SYMPTOMS/MOOD NEUROLOGICAL BOWEL NUTRITION STATUS      Incontinent Diet (Dys 2, Thin Liquids-extra gravy on meat and potatoes, yogurt TID meals)  AMBULATORY STATUS COMMUNICATION OF NEEDS Skin   Limited Assist Verbally Normal                       Personal Care Assistance Level of Assistance  Bathing, Feeding, Dressing Bathing Assistance: Limited assistance Feeding assistance: Limited assistance Dressing Assistance: Limited assistance     Functional Limitations Info   Sight, Hearing, Speech Sight Info: Impaired Hearing Info: Impaired Speech Info: Impaired    SPECIAL CARE FACTORS FREQUENCY   Psych Meds                    Contractures      Additional Factors Info  Code Status, Allergies Code Status Info: Full Code Allergies Info: Sulfa Antibiotics, Keflex           Current Medications (05/24/2015):  This is the current hospital active medication list Current Facility-Administered Medications  Medication Dose Route Frequency Provider Last Rate Last Dose  . acetaminophen (TYLENOL) tablet 650 mg  650 mg Oral Q6H PRN Loletha Grayer, MD       Or  . acetaminophen (TYLENOL) suppository 650 mg  650 mg Rectal Q6H PRN Loletha Grayer, MD      . amLODipine (NORVASC) tablet 10 mg  10 mg Oral Daily Loletha Grayer, MD   10 mg at 05/23/15 2112  . amoxicillin-clavulanate (AUGMENTIN) 875-125 MG per tablet 1 tablet  1 tablet Oral Q12H Gladstone Lighter, MD   1 tablet at 05/24/15 1031  . bisacodyl (DULCOLAX) suppository 10 mg  10 mg Rectal Daily PRN Gladstone Lighter, MD      . clotrimazole (LOTRIMIN) 1 % cream 1 application  1 application Topical BID Loletha Grayer, MD   1 application at [id number]  . docusate sodium (COLACE) capsule 100 mg  100 mg Oral BID Loletha Grayer, MD   100 mg at 05/24/15 1031  . DULoxetine (CYMBALTA) DR capsule 60 mg  60 mg Oral Daily Loletha Grayer, MD   60 mg at 05/24/15 1031  . enoxaparin (LOVENOX) injection 40 mg  40 mg Subcutaneous Q24H Loletha Grayer, MD   40 mg at 05/23/15 1440  . guaiFENesin (MUCINEX) 12 hr tablet 600 mg  600 mg Oral BID Gladstone Lighter, MD   600 mg at 05/24/15 1030  . HYDROcodone-acetaminophen (NORCO/VICODIN) 5-325 MG per tablet 1 tablet  1 tablet Oral Q4H PRN Loletha Grayer, MD   1 tablet at 05/24/15 1030  . ipratropium-albuterol (DUONEB) 0.5-2.5 (3) MG/3ML nebulizer solution 3 mL  3 mL Nebulization TID Loletha Grayer, MD   3 mL at 05/24/15 0757  . ipratropium-albuterol (DUONEB) 0.5-2.5 (3)  MG/3ML nebulizer solution 3 mL  3 mL Nebulization Q6H PRN Loletha Grayer, MD      . lamoTRIgine (LAMICTAL) tablet 300 mg  300 mg Oral BID Loletha Grayer, MD   300 mg at 05/24/15 1030  . letrozole [facility]) tablet 2.5 mg  2.5 mg Oral Daily Loletha Grayer, MD   2.5 mg at 05/24/15 1031  . loratadine (CLARITIN) tablet 10 mg  10 mg Oral Daily Loletha Grayer, MD   10 mg at 05/24/15 1031  . LORazepam (ATIVAN) tablet 0.5 mg  0.5 mg Oral Q8H PRN Gladstone Lighter, MD   0.5 mg at 05/23/15 1124  . losartan (COZAAR) tablet 100 mg  100 mg Oral Daily Loletha Grayer, MD   100 mg at 05/24/15 1030  . pantoprazole (PROTONIX) EC tablet 40 mg  40 mg Oral Daily Loletha Grayer, MD   40 mg at 05/24/15 1031  . polyethylene glycol (MIRALAX / GLYCOLAX) packet 17 g  17 g Oral Daily Gladstone Lighter, MD   17 g at 05/24/15 1033  . sodium chloride flush (NS) 0.9 % injection 3 mL  3 mL Intravenous Q12H Loletha Grayer, MD   3 mL at 05/24/15 1034  . terbinafine (LAMISIL) 1 % cream   Topical BID Loletha Grayer, MD         Discharge Medications: Please see discharge summary for a list of discharge medications.  Current Discharge Medication List    START taking these medications   Details  amoxicillin-clavulanate (AUGMENTIN) 875-125 MG tablet Take 1 tablet by mouth 2 (two) times daily. X 8 days Qty: 16 tablet, Refills: 0    bisacodyl (DULCOLAX) 10 MG suppository Place 1 suppository (10 mg total) rectally daily as needed for moderate constipation. Qty: 12 suppository, Refills: 0    guaiFENesin (MUCINEX) 600 MG 12 hr tablet Take 1 tablet (600 mg total) by mouth 2 (two) times daily. Qty: 30 tablet, Refills: 0    polyethylene glycol (MIRALAX / GLYCOLAX) packet Take 17 g by mouth daily. Qty: 14 each, Refills: 0      CONTINUE these medications which have CHANGED   Details  HYDROcodone-acetaminophen (NORCO/VICODIN) 5-325 MG tablet Take 1 tablet by mouth every 6 (six) hours as needed for  moderate pain. Qty: 30 tablet, Refills: 0    LORazepam (ATIVAN) 1 MG tablet Take 1 tablet (1 mg total) by mouth every 8 (eight) hours as needed (take one tablet by mouth as needed for seizure lasting longer than 3 min. or more than 2 seizures in 2 hours). Qty: 25 tablet, Refills: 0      CONTINUE these medications which have NOT CHANGED   Details  alendronate (FOSAMAX) 70 MG tablet 1 TAB PO WEEKLY EARLY AM BEFORE FOOD/MEDS W/WATER. DON'T LIE DOWN FOR30 MIN (OSTEOPOROSIS) *NO CRUSH* Qty: 5 tablet, Refills: 12  amLODipine (NORVASC) 10 MG tablet Take 1 tablet (10 mg total) by mouth daily. Qty: 30 tablet, Refills: 12    BENZOYL PEROXIDE 5 % external wash USE TO [facility] AFFECTED AREA DAILY Qty: 237 g, Refills: 0    calcium-vitamin D (OSCAL WITH D) 500-200 MG-UNIT per tablet Take 1 tablet by mouth daily with breakfast. Qty: 30 tablet, Refills: 12    clindamycin (CLEOCIN T) 1 % external solution Apply topically daily. Qty: 60 mL, Refills: 12    clotrimazole (LOTRIMIN) 1 % cream Apply 1 application topically 2 (two) times daily. For rash in skin folds    docusate sodium (COLACE) 100 MG capsule Take 1 capsule (100 mg total) by mouth 2 (two) times daily. Qty: 60 capsule, Refills: 12    DULoxetine (CYMBALTA) 60 MG capsule Take 1 capsule (60 mg total) by mouth daily. Qty: 30 capsule, Refills: 12    lamoTRIgine (LAMICTAL) 150 MG tablet Take 300 mg by mouth 2 (two) times daily.     letrozole (FEMARA) 2.5 MG tablet Take 2.5 mg by mouth daily.    loratadine (CLARITIN) 10 MG tablet Take 1 tablet (10 mg total) by mouth daily. Qty: 30 tablet, Refills: 12    losartan (COZAAR) 100 MG tablet Take 1 tablet (100 mg total) by mouth daily. Qty: 30 tablet, Refills: 12    meloxicam (MOBIC) 7.5 MG tablet Take 7.5 mg by mouth 2 (two) times daily. With meals    omeprazole (PRILOSEC) 20 MG capsule TAKE 1 CAPSULE BY MOUTH TWICE DAILY FOR GERD. (DO NOT  CRUSH) Qty: 60 capsule, Refills: 12    terbinafine (LAMISIL) 1 % cream Apply to affected area 2 times daily until clear and then 2 times a week for suppression Qty: 30 g, Refills: 2           Standards PRNs  Relevant Imaging Results:  Relevant Lab Results:   Additional Information SSN:  [id number]    Darden Dates, LCSW

## 2015-05-24 NOTE — Care Management Important Message (Signed)
Important Message  Patient Details  Name: Helen Patton MRN: [id number] Date of Birth: [birth year]   Medicare Important Message Given:  Yes    Shelbie Ammons, RN 05/24/2015, 8:17 AM

## 2015-05-24 NOTE — Progress Notes (Signed)
Pt has been discharged back to [facility] group home. Discharge instructions given and explained to [facility]. pt's caregiver at the group home. Caregiver verbalized understanding. Follow up appointments and meds reviewed. Discharge packet given to care giver. Pt transported to group home by [name] transportation.

## 2015-05-24 NOTE — Evaluation (Signed)
Physical Therapy Evaluation Patient Details Name: Helen Patton MRN: [id number] DOB: [birth year] Today's Date: 05/24/2015   History of Present Illness  presented to hospital for elective R hip surgery; however, upon arrival, noted to be hypoxic with newly-diagnosed PNA (likely related to aspiration).  R hip surgery rescheduled, but patient admitted for treatment of PNA.  Currently weaned to RA, tolerating OOB activities with sats 90-91% on RA.  Clinical Impression  Upon evaluation, patient alert and responsive to therapist, but largely non-verbal.  Tends to indicate wants/needs via hand gestures; follows approx 60-75% simple commands (verbally and with return gestures/demonstration). Patient very guarded in movement of R LE with all movement transitions; tolerates very minimal/no WBing with mobility efforts.  Currently requiring min/mod assist +2 for sit/stand, SPT between seating surfaces; absent active use of R LE with transfer (maintains NWB and off of floor throughout transfer).  Gait not attempted secondary to R hip pain and plans for upcoming surgical procedure.  Recommend continued use of manual WC as primary mobility upon discharge. Did attempt to promote scoot pivot transfers between surfaces to maximize patient safety and decrease caregiver burdent, but patient unable to comprehend alternative/new movement pattern and unwilling to atttempt additional activity once OOB to chair.  Will continue efforts as appropriate. Would benefit from skilled PT to address above deficits and promote optimal return to PLOF; Recommend transition to [place] in patient's familiar environment with familiar caregivers upon discharge from acute hospitalization.     Follow Up Recommendations Home health PT;Supervision/Assistance - 24 hour    Equipment Recommendations       Recommendations for Other Services       Precautions / Restrictions Precautions Precautions: Fall Precaution Comments: Seizure  history Restrictions Weight Bearing Restrictions: No      Mobility  Bed Mobility Overal bed mobility: Needs Assistance Bed Mobility: Supine to Sit     Supine to sit: Mod assist     General bed mobility comments: for management of LEs towards edge of bed.  Able to relax LEs into short-sitting position (flexed at hips, knees) but prefers to maintain R LE in full extension/NWB with all sitting efforts  Transfers Overall transfer level: Needs assistance Equipment used: 2 person hand held assist Transfers: Sit to/from Stand Sit to Stand: Min assist;Mod assist         General transfer comment: maintains weight exclusively in L LE (R LE in NWB, not in contact with floor surface) and pivots, slides between surfaces with L LE  Ambulation/Gait             General Gait Details: non-ambulatory due to R hip pain/restrictions  Stairs            Wheelchair Mobility    Modified Rankin (Stroke Patients Only)       Balance Overall balance assessment: Needs assistance Sitting-balance support: No upper extremity supported;Feet supported Sitting balance-Leahy Scale: Good     Standing balance support: Bilateral upper extremity supported Standing balance-Leahy Scale: Fair                               Pertinent Vitals/Pain Pain Assessment: Faces Faces Pain Scale: Hurts whole lot Pain Location: R hip Pain Descriptors / Indicators: Grimacing;Guarding Pain Intervention(s): Limited activity within patient's tolerance;Monitored during session;Repositioned    Home Living Family/patient expects to be discharged to:: Group home [name])  Prior Function Level of Independence: Needs assistance         Comments: Per caregiver within facility, at true baseline, patient is ambulatory with minimal/no use of assist device.  However, since January (due to progressive R hip pain and recommendations of Dr. Hooten/orthopedist), patient  has been exclusively WC level, performing SPT with +1 assist to/from all surfaces.  Progressively limited use of R LE noted due to pain.     Hand Dominance        Extremity/Trunk Assessment   Upper Extremity Assessment: Overall WFL for tasks assessed           Lower Extremity Assessment:  (L LE appears at least 4/5; R LE difficulty to assess secondary to significant guarding (patient with limited movement of R hip due to pain)--knee and ankle appear at least 3+/5)         Communication      Cognition Arousal/Alertness: Awake/alert Behavior During Therapy: Impulsive Overall Cognitive Status: Difficult to assess (patient non-verbal at baseline; appears to follow approx 60-75% simple commands, enhanced with gestures)                      General Comments      Exercises Other Exercises Other Exercises: Attempted to instruct in scoot pivot transfers (to enhance patient safety/indep, decrease caregiver burden) with therapist both demonstrating, gesturing and verbally instructing method; however, patient unable to comprehend alternative/new movement pattern and unwilling to atttempt additional activity once OOB to chair.  Will continue efforts as able.      Assessment/Plan    PT Assessment Patient needs continued PT services  PT Diagnosis Difficulty walking;Generalized weakness;Acute pain   PT Problem List Decreased strength;Decreased range of motion;Decreased activity tolerance;Decreased balance;Decreased mobility;Decreased cognition;Decreased knowledge of use of DME;Decreased safety awareness;Decreased knowledge of precautions;Pain  PT Treatment Interventions DME instruction;Functional mobility training;Therapeutic activities;Therapeutic exercise;Balance training;Patient/family education   PT Goals (Current goals can be found in the Care Plan section) Acute Rehab PT Goals PT Goal Formulation: Patient unable to participate in goal setting Time For Goal Achievement:  06/07/15 Potential to Achieve Goals: Fair    Frequency Min 2X/week   Barriers to discharge Decreased caregiver support      Co-evaluation               End of Session Equipment Utilized During Treatment: Gait belt Activity Tolerance: Patient limited by pain Patient left: in chair;with call bell/phone within reach;with chair alarm set;with family/visitor present Nurse Communication: Mobility status (+2 SPT with bilat HHA for return to bed)         Time: [id number] PT Time Calculation (min) (ACUTE ONLY): 14 min   Charges:   PT Evaluation $PT Eval Moderate Complexity: 1 Procedure PT Treatments $Therapeutic Activity: 8-22 mins   PT G Codes:        Mustaf Antonacci H. Owens Shark, PT, DPT, NCS 05/24/2015, 2:06 PM [phone]

## 2015-05-24 NOTE — Care Management (Signed)
Possible discharge back to [facility] today per Dr. Tressia Miners.  ABG results from yesterday show O2 saturations 81% room air and at rest.  PCO2 = [place] RN MSN [place] [phone]

## 2015-05-24 NOTE — Progress Notes (Signed)
Speech Language Pathology Treatment: Dysphagia  Patient Details Name: OLLA SERVISS MRN: [id number] DOB: 04/18/1960 Today's Date: 05/24/2015 Time: 1300-1400 SLP Time Calculation (min) (ACUTE ONLY): 60 min  Assessment / Plan / Recommendation Clinical Impression  Pt and mother seen today for pt's toleration of diet, Dys. 2 w/ thin liquids, as well as education on feeding strategies and education on aspiration precautions. Pt appeared to tolerate meds in puree w/ NSG as well as smaller, single sips of thin liquids via straw w/ 100% supervision to limit "gulping" drinking. Pt was able to follow through w/ the rec'd drinking strategies w/ moderate verbal/tactile cues. Mother present and educated handouts of food prep and on feeding strategies to reduce impulsive eating behavior including only putting a small amount on utensil and in the drink cup at a time refilling as the meal continues; use of a Dysphagia drink cup to limit bolus size/amount/flow. Education on other aspiration precautions; strategies; drink and food options given as well w/ handouts. Contact info. provided. Rec. F/u w/ St services at [facility] for any further education on aspiration precautions and feeding strategies as this would be in pt's home environment. Mother agreed.   HPI HPI: Pt is a 55 y.o. female with a known history of microcephaly and mental retardation. The patient is nonverbal. The patient was scheduled to have a right hip revision by [name]. The procedure was canceled secondary to hypoxia. When the patient was placed on oxygen then she started coughing. A chest x-ray revealed a right upper lobe pneumonia. Hospitalist services were contacted for further evaluation. History obtained from old chart and family at the bedside. Patient has been wheelchair bound since her hip issues and has NOT had a bowel movement in ~4-5 days per Mother. During MBSS performed yesterday, timing of the pharyngeal swallow is delayed  triggering while falling from the valleculae to the pyriform sinuses.With cup rim "gulps", the patient demonstrates transient laryngeal penetration without aspiration.With straw sips, the patient demonstrates inconsistent transient laryngeal penetration. An esophageal sweep in the upright position with liquid and solidconsistencies showed esophageal retention with retrograde flow within theesophagus. Pt does have the risk for aspiration of Reflux material. Pt is tolerating her current Dys. 2 w/ thin liquids diet w/ strict aspiration precautions per report.       SLP Plan  Continue with current plan of care     Recommendations  Diet recommendations: Dysphagia 2 (fine chop);Thin liquid Liquids provided via: Straw;Cup Medication Administration: Crushed with puree Supervision: Staff to assist with self feeding;Full supervision/cueing for compensatory strategies Compensations: Minimize environmental distractions;Slow rate;Small sips/bites;Follow solids with liquid Postural Changes and/or Swallow Maneuvers: Seated upright 90 degrees;Upright 30-60 min after meal             General recommendations: Rehab consult (at [facility] for f/u w/ education w/ Staff on precautions) Oral Care Recommendations: Oral care BID;Staff/trained caregiver to provide oral care Follow up Recommendations: Skilled Nursing facility Plan: Continue with current plan of care     [place], [place], CCC-SLP  Watson,Katherine 05/24/2015, 5:06 PM

## 2015-05-24 NOTE — Telephone Encounter (Signed)
fyi-aa 

## 2015-05-24 NOTE — Telephone Encounter (Signed)
Pt is being discharged from the hospital for pneumonia today.  I have scheduled a hospital follow up appt/MW

## 2015-05-24 NOTE — Clinical Social Work Note (Signed)
Pt is ready for discharge today to [facility]. CSW prepared discharge information and sent it to facility. Facility is able to accept and will provide transportation. CSW updated RN. CSW also spoke with pt's mother, who is in agreement and appreciative of CSW support. CSW is signing off as no further needs identified.   Darden Dates, MSW, LCSW  Clinical Social Worker  [phone]

## 2015-05-25 LAB — CULTURE, BLOOD (ROUTINE X 2)
CULTURE: NO GROWTH
Culture: NO GROWTH

## 2015-05-28 NOTE — Telephone Encounter (Signed)
thanks

## 2015-05-30 ENCOUNTER — Telehealth: Payer: Self-pay | Admitting: Family Medicine

## 2015-05-30 DIAGNOSIS — R9389 Abnormal findings on diagnostic imaging of other specified body structures: Secondary | ICD-10-CM

## 2015-05-30 DIAGNOSIS — J189 Pneumonia, unspecified organism: Secondary | ICD-10-CM

## 2015-05-30 NOTE — Telephone Encounter (Signed)
Are you ok with doing CPE on May 16th, Tuesday, at 11 am? i dont see any other CPE slots unless we work in a CPE in the afternoon after 3 pm. Please review-aa

## 2015-05-30 NOTE — Telephone Encounter (Signed)
Pt's caregiver [name] call to schedule pt for her CPE/AWE. Pt had her last CPE on 06/22/15. The first CPE available after 06/22/15 is 07/18/15. [name] stated pt needs it before then and would like to see if Dr. Rosanna Randy could work pt in. Please advise. Thanks TNP

## 2015-05-30 NOTE — Telephone Encounter (Signed)
Becky advised on voicemail, appt made and advised Helen Patton if that day does not work to let us know-aa

## 2015-05-30 NOTE — Telephone Encounter (Signed)
ok 

## 2015-06-01 ENCOUNTER — Ambulatory Visit (INDEPENDENT_AMBULATORY_CARE_PROVIDER_SITE_OTHER): Payer: Medicare Other | Admitting: Family Medicine

## 2015-06-01 VITALS — BP 104/60 | HR 86 | Temp 97.9°F | Resp 12

## 2015-06-01 DIAGNOSIS — Z09 Encounter for follow-up examination after completed treatment for conditions other than malignant neoplasm: Secondary | ICD-10-CM | POA: Diagnosis not present

## 2015-06-01 DIAGNOSIS — R0902 Hypoxemia: Secondary | ICD-10-CM | POA: Diagnosis not present

## 2015-06-01 DIAGNOSIS — K5909 Other constipation: Secondary | ICD-10-CM

## 2015-06-01 DIAGNOSIS — J690 Pneumonitis due to inhalation of food and vomit: Secondary | ICD-10-CM | POA: Diagnosis not present

## 2015-06-01 DIAGNOSIS — M25551 Pain in right hip: Secondary | ICD-10-CM | POA: Diagnosis not present

## 2015-06-01 DIAGNOSIS — F79 Unspecified intellectual disabilities: Secondary | ICD-10-CM | POA: Diagnosis not present

## 2015-06-01 DIAGNOSIS — E669 Obesity, unspecified: Secondary | ICD-10-CM | POA: Diagnosis not present

## 2015-06-01 NOTE — Progress Notes (Signed)
Patient ID: Helen Patton, female   DOB: 05/09/1960, 55 y.o.   MRN: [id number]    Subjective:  HPI  Patient is here for hospital follow up: Dates of admission and discharge are 4/7-4/12  Diagnoses: Acute hypoxia secondary to aspiration pneumonia-right upper lobe infiltrate. CT chest was done, Echo, MBSS, chest physiotherapy, EKG and labs were done along with blood cultures. She was placed on Augmentin to take for 8 days. Start Mucinex.  Right hip pain: Surgery was postponed due to patient getting sick.  Constipation: Patient to take Miralax, Dulcolax suppository as needed.  Per caregiver patient went in to have hip surgery on the 7th and had IV in she was given oxygen due to her stats were a little low and after that she started coughing severily. After that patient was admitted. Caregiver states that patient did not have any other symptoms and they were not aware until then that she was sick. Patient has finished Augmentin course and still taking Mucinex. She has not had to use Miralax or Dulcolax suppository, patient has had bowel movements normally so far. Patient has seemed ok since the hospital stay so far. Patient's bed has been raised and the way patient is been fed has changed. Patient has had some chest PT and regular PT since her hospital stay.   Prior to Admission medications   Medication Sig Start Date End Date Taking? Authorizing Provider  alendronate (FOSAMAX) 70 MG tablet 1 TAB PO WEEKLY EARLY AM BEFORE FOOD/MEDS W/WATER. DON'T LIE DOWN FOR30 MIN (OSTEOPOROSIS) *NO CRUSH* 08/03/14   Richard Maceo Pro., MD  amLODipine (NORVASC) 10 MG tablet Take 1 tablet (10 mg total) by mouth daily. 04/05/15   Richard Maceo Pro., MD  amoxicillin-clavulanate (AUGMENTIN) 875-125 MG tablet Take 1 tablet by mouth 2 (two) times daily. X 8 days 05/22/15   Gladstone Lighter, MD  BENZOYL PEROXIDE 5 % external wash USE TO [facility] AFFECTED AREA DAILY 10/24/14   Jerrol Banana., MD  bisacodyl  (DULCOLAX) 10 MG suppository Place 1 suppository (10 mg total) rectally daily as needed for moderate constipation. 05/24/15   Gladstone Lighter, MD  calcium-vitamin D (OSCAL WITH D) 500-200 MG-UNIT per tablet Take 1 tablet by mouth daily with breakfast. 08/03/14   Jerrol Banana., MD  clindamycin (CLEOCIN T) 1 % external solution Apply topically daily. 10/04/14   Richard Maceo Pro., MD  clotrimazole (LOTRIMIN) 1 % cream Apply 1 application topically 2 (two) times daily. For rash in skin folds    Historical Provider, MD  docusate sodium (COLACE) 100 MG capsule Take 1 capsule (100 mg total) by mouth 2 (two) times daily. 04/05/15   Richard Maceo Pro., MD  DULoxetine (CYMBALTA) 60 MG capsule Take 1 capsule (60 mg total) by mouth daily. 04/05/15   Richard Maceo Pro., MD  guaiFENesin (MUCINEX) 600 MG 12 hr tablet Take 1 tablet (600 mg total) by mouth 2 (two) times daily. 05/24/15   Gladstone Lighter, MD  HYDROcodone-acetaminophen (NORCO/VICODIN) 5-325 MG tablet Take 1 tablet by mouth every 6 (six) hours as needed for moderate pain. 05/22/15   Gladstone Lighter, MD  lamoTRIgine (LAMICTAL) 150 MG tablet Take 300 mg by mouth 2 (two) times daily.     Historical Provider, MD  letrozole (FEMARA) 2.5 MG tablet Take 2.5 mg by mouth daily.    Historical Provider, MD  loratadine (CLARITIN) 10 MG tablet Take 1 tablet (10 mg total) by mouth daily. 04/05/15   [place] Gilbert  Brooke Bonito., MD  LORazepam (ATIVAN) 1 MG tablet Take 1 tablet (1 mg total) by mouth every 8 (eight) hours as needed (take one tablet by mouth as needed for seizure lasting longer than 3 min.  or more than 2 seizures in 2 hours). 05/22/15   Gladstone Lighter, MD  losartan (COZAAR) 100 MG tablet Take 1 tablet (100 mg total) by mouth daily. 04/05/15   Richard Maceo Pro., MD  meloxicam (MOBIC) 7.5 MG tablet Take 7.5 mg by mouth 2 (two) times daily. With meals    Historical Provider, MD  omeprazole (PRILOSEC) 20 MG capsule TAKE 1 CAPSULE BY MOUTH TWICE  DAILY FOR GERD. (DO NOT CRUSH) 12/28/14   Richard Maceo Pro., MD  polyethylene glycol [facility] Floria Raveling) packet Take 17 g by mouth daily. 05/24/15   Gladstone Lighter, MD  terbinafine (LAMISIL) 1 % cream Apply to affected area 2 times daily until clear and then 2 times a week for suppression Patient taking differently: Apply to affected area 2 times daily until clear and then 2 times a week for suppression ( on feet) 05/04/15   Jerrol Banana., MD    Patient Active Problem List   Diagnosis Date Noted  . Acute respiratory failure with hypoxia ([place]) 05/19/2015  . Anxiety 12/14/2014  . Arthralgia of hip 12/14/2014  . Athlete's foot 12/14/2014  . Cerebral palsy ([place]) 12/14/2014  . Cognitive decline 12/14/2014  . Clinical depression 12/14/2014  . Acid reflux 12/14/2014  . HLD (hyperlipidemia) 12/14/2014  . BP (high blood pressure) 12/14/2014  . Methicillin resistant Staphylococcus aureus infection 12/14/2014  . Adiposity 12/14/2014  . Breast cancer ([place]) 11/21/2013  . Mental retardation 11/16/2013  . Seizure ([place]) 09/30/2013    Past Medical History  Diagnosis Date  . Mental retardation   . GERD (gastroesophageal reflux disease)   . Osteoporosis   . Hypertension   . Seizures ([place])   . MRSA (methicillin resistant staph aureus) culture positive 2012  . Hidradenitis 2012    MRSA, on chronic topical suppression  . Cancer [facility]) Oct 2015    invasive mammary carcinoma  . Breast cancer ([place]) 2015    [name]  . [place]     Social History   Social History  . Marital Status: Single    Spouse Name: N/A  . Number of Children: N/A  . Years of Education: N/A   Occupational History  . Not on file.   Social History Main Topics  . Smoking status: Never Smoker   . Smokeless tobacco: Never Used  . Alcohol Use: No  . Drug Use: No  . Sexual Activity: No   Other Topics Concern  . Not on file   Social History Narrative    Allergies  Allergen Reactions  .  Sulfa Antibiotics Other (See Comments)    "Does not know"  . Keflex [Cephalexin] Other (See Comments)    "Does not know" unknown    Review of Systems  Constitutional: Positive for malaise/fatigue.  Respiratory: Negative.   Cardiovascular: Negative.   Gastrointestinal: Negative.   Musculoskeletal: Positive for myalgias and joint pain.  Neurological: Positive for weakness.    Immunization History  Administered Date(s) Administered  . Influenza,inj,Quad PF,36+ Mos 10/29/2014   Objective:  BP 104/60 mmHg  Pulse 86  Temp(Src) 97.9 F (36.6 C)  Resp 12  SpO2 97%  Physical Exam  Constitutional: She is oriented to person, place, and time and well-developed, well-nourished, and in no distress. Vital signs are normal.  HENT:  Head: Normocephalic and atraumatic.  Eyes: Conjunctivae are normal. Pupils are equal, round, and reactive to light.  Neck: Normal range of motion. Neck supple.  Cardiovascular: Normal rate, regular rhythm, normal heart sounds and intact distal pulses.   No murmur heard. Pulmonary/Chest: Effort normal and breath sounds normal. No respiratory distress. She has no wheezes.  Musculoskeletal:  Patient is in a wheelchair today, due to hip pain not able to walk well.  Neurological: She is oriented to person, place, and time. She displays weakness and abnormal speech. Coordination and gait abnormal.    Lab Results  Component Value Date   WBC 8.3 05/23/2015   HGB 11.6* 05/23/2015   HCT 34.7* 05/23/2015   PLT 302 05/23/2015   GLUCOSE 127* 05/24/2015   TSH 2.55 12/28/2011   INR 0.95 05/11/2015    CMP     Component Value Date/Time   NA 142 05/24/2015 0953   NA 146 06/17/2013   NA 136 01/08/2013 1742   K 3.5 05/24/2015 0953   K 4.0 01/08/2013 1742   CL 101 05/24/2015 0953   CL 102 01/08/2013 1742   CO2 33* 05/24/2015 0953   CO2 32 01/08/2013 1742   GLUCOSE 127* 05/24/2015 0953   GLUCOSE 122* 01/08/2013 1742   BUN 23* 05/24/2015 0953   BUN 13  06/17/2013   BUN 21* 01/08/2013 1742   CREATININE 0.65 05/24/2015 0953   CREATININE 0.6 06/17/2013   CREATININE 0.74 01/08/2013 1742   CALCIUM 9.6 05/24/2015 0953   CALCIUM 9.6 01/08/2013 1742   PROT 7.9 09/26/2014 1545   PROT 7.3 01/08/2013 1742   ALBUMIN 4.5 09/26/2014 1545   ALBUMIN 4.1 01/08/2013 1742   AST 24 09/26/2014 1545   AST 39* 01/08/2013 1742   ALT 16 09/26/2014 1545   ALT 50 01/08/2013 1742   ALKPHOS 79 09/26/2014 1545   ALKPHOS 108 01/08/2013 1742   BILITOT 0.6 09/26/2014 1545   BILITOT 0.4 01/08/2013 1742   GFRNONAA >60 05/24/2015 0953   GFRNONAA >60 01/08/2013 1742   GFRAA >60 05/24/2015 0953   GFRAA >60 01/08/2013 1742    Assessment and Plan :  1. Hospital discharge follow-up Records reviewed.  2. Hypoxia Resolved at this time.secondary to pneumonia  3. Aspiration pneumonia of right lower lobe, unspecified aspiration pneumonia type ([place]) Asymptomatic today. Vitals stable.   I think the major issue is that she has high risk factors for recurrent aspiration pneumonia/pneumonitis.her head of bed is been elevated and her diet has been adjusted. They are following the diet as much as they can in the group home.overall I think her risk of recurrent pneumonia is high from aspiration but lately she can get through this surgery in the next month or 2 without recurrence.See  Her back in 3 weeks and we'll repeat chest x-ray then. 4. Mental retardation  5. Adiposity  6. Right hip pain Will re schedule surgery for this issue again.were probably clear on next visit on 06/26/2068  7. Other constipation Per caregiver has not been an issue since hospital stay. Resolved at this time. Follow as needed.  Patient was seen and examined by Dr. Eulas Post and note was scribed by Theressa Millard, RMA. I have done the exam and reviewed the above chart and it is accurate to the best of my knowledge.    Miguel Aschoff MD [facility] 06/01/2015 9:56 AM

## 2015-06-13 DIAGNOSIS — F339 Major depressive disorder, recurrent, unspecified: Secondary | ICD-10-CM | POA: Diagnosis not present

## 2015-06-20 ENCOUNTER — Other Ambulatory Visit: Payer: Self-pay

## 2015-06-20 NOTE — Telephone Encounter (Signed)
Fax from [employer] for [employer]

## 2015-06-21 MED ORDER — MELOXICAM 7.5 MG PO TABS: 7.5000 mg | ORAL_TABLET | Freq: Two times a day (BID) | ORAL | Status: DC

## 2015-06-21 MED ORDER — LETROZOLE 2.5 MG PO TABS: 2.5000 mg | ORAL_TABLET | Freq: Every day | ORAL | Status: DC

## 2015-06-21 MED ORDER — LOSARTAN POTASSIUM 100 MG PO TABS: 100.0000 mg | ORAL_TABLET | Freq: Every day | ORAL | Status: DC

## 2015-06-21 MED ORDER — LAMOTRIGINE 150 MG PO TABS: 300.0000 mg | ORAL_TABLET | Freq: Two times a day (BID) | ORAL | Status: DC

## 2015-06-23 DIAGNOSIS — W19XXXA Unspecified fall, initial encounter: Secondary | ICD-10-CM | POA: Diagnosis not present

## 2015-06-23 DIAGNOSIS — Y92099 Unspecified place in other non-institutional residence as the place of occurrence of the external cause: Secondary | ICD-10-CM | POA: Diagnosis not present

## 2015-06-23 DIAGNOSIS — W050XXA Fall from non-moving wheelchair, initial encounter: Secondary | ICD-10-CM | POA: Diagnosis not present

## 2015-06-23 DIAGNOSIS — Z711 Person with feared health complaint in whom no diagnosis is made: Secondary | ICD-10-CM | POA: Diagnosis not present

## 2015-06-27 ENCOUNTER — Ambulatory Visit
Admission: RE | Admit: 2015-06-27 | Discharge: 2015-06-27 | Disposition: A | Discharge status: Home / Self Care | Payer: Medicare Other | Source: Ambulatory Visit | Attending: Family Medicine | Admitting: Family Medicine

## 2015-06-27 ENCOUNTER — Ambulatory Visit (INDEPENDENT_AMBULATORY_CARE_PROVIDER_SITE_OTHER): Payer: Medicare Other | Admitting: Family Medicine

## 2015-06-27 VITALS — BP 130/84 | HR 100 | Resp 16

## 2015-06-27 DIAGNOSIS — M160 Bilateral primary osteoarthritis of hip: Secondary | ICD-10-CM

## 2015-06-27 DIAGNOSIS — R05 Cough: Secondary | ICD-10-CM | POA: Diagnosis not present

## 2015-06-27 DIAGNOSIS — Z0001 Encounter for general adult medical examination with abnormal findings: Secondary | ICD-10-CM | POA: Insufficient documentation

## 2015-06-27 DIAGNOSIS — Z0000 Encounter for general adult medical examination without abnormal findings: Secondary | ICD-10-CM | POA: Diagnosis not present

## 2015-06-27 DIAGNOSIS — R918 Other nonspecific abnormal finding of lung field: Secondary | ICD-10-CM | POA: Diagnosis not present

## 2015-06-27 DIAGNOSIS — T17908D Unspecified foreign body in respiratory tract, part unspecified causing other injury, subsequent encounter: Secondary | ICD-10-CM

## 2015-06-27 DIAGNOSIS — K219 Gastro-esophageal reflux disease without esophagitis: Secondary | ICD-10-CM | POA: Diagnosis not present

## 2015-06-27 NOTE — Progress Notes (Signed)
Patient ID: Helen Patton, female   DOB: August 15, 1960, 55 y.o.   MRN: [id number] Patient: Helen Patton, Female    DOB: 1960-10-25, 55 y.o.   MRN: [id number] Visit Date: 06/27/2015  Today's Provider: Wilhemena Durie, MD   Chief Complaint  Patient presents with  . Annual Exam   Subjective:   Helen Patton is a 55 y.o. female who presents today for her Subsequent Annual Wellness Visit. She feels well. She reports exercising none. She reports she is sleeping well. At the group home they are trying to follow a special diet for her. She is certainly not eating like she once did. She would literally gorge her mouth full of food as she was eating. No fevers, no seizures. No PND or orthopnea. She does get her medications at the group home and receives excellent care. Medicare wellness is reviewed and due to mental retardation we are unable to do with any repeatability the mental status exam. BMD-05/17/09 Mamm-11/08/14 Colonoscopy-None  Patient is also due to have a follow up chest x-ray for her pneumonia  Review of Systems  Constitutional: Positive for activity change.  HENT: Positive for trouble swallowing.   Eyes: Negative.   Respiratory: Negative.   Cardiovascular: Negative.   Gastrointestinal: Negative.   Endocrine: Negative.   Genitourinary: Negative.   Musculoskeletal: Negative.   Skin: Negative.   Allergic/Immunologic: Negative.   Neurological: Negative.   Hematological: Negative.   Psychiatric/Behavioral: Positive for agitation. The patient is nervous/anxious.     Patient Active Problem List   Diagnosis Date Noted  . Acute respiratory failure with hypoxia ([place]) 05/19/2015  . Avascular necrosis of femoral head ([place]) 03/07/2015  . Closed fracture of neck of right femur ([place]) 03/07/2015  . Anxiety 12/14/2014  . Arthralgia of hip 12/14/2014  . Athlete's foot 12/14/2014  . Cerebral palsy ([place]) 12/14/2014  . Cognitive decline 12/14/2014  . Clinical depression  12/14/2014  . Acid reflux 12/14/2014  . HLD (hyperlipidemia) 12/14/2014  . BP (high blood pressure) 12/14/2014  . Methicillin resistant Staphylococcus aureus infection 12/14/2014  . Adiposity 12/14/2014  . Breast cancer ([place]) 11/21/2013  . Mental retardation 11/16/2013  . Seizure ([place]) 09/30/2013    Social History   Social History  . Marital Status: Single    Spouse Name: N/A  . Number of Children: N/A  . Years of Education: N/A   Occupational History  . Not on file.   Social History Main Topics  . Smoking status: Never Smoker   . Smokeless tobacco: Never Used  . Alcohol Use: No  . Drug Use: No  . Sexual Activity: No   Other Topics Concern  . Not on file   Social History Narrative    Past Surgical History  Procedure Laterality Date  . Broken leg    . Dislocated hip    . Cervical ablation    . Breast surgery Left 12-07-13    T1b, Nx; ER+, PR+, her 2 pending. (Not candidate for chemotherapy)    Her family history includes Arthritis in her mother; Autoimmune disease in her brother; Breast cancer in her maternal aunt; Cancer in her paternal grandmother; Cancer (age of onset: 83) in her maternal aunt; Diabetes in her father and mother; Hypertension in her maternal grandmother; Neuropathy in her mother; Parkinson's disease in her father.    Outpatient Prescriptions Prior to Visit  Medication Sig Dispense Refill  . alendronate (FOSAMAX) 70 MG tablet 1 TAB PO WEEKLY EARLY AM BEFORE FOOD/MEDS W/WATER. DON'T LIE DOWN  FOR30 MIN (OSTEOPOROSIS) *NO CRUSH* 5 tablet 12  . amLODipine (NORVASC) 10 MG tablet Take 1 tablet (10 mg total) by mouth daily. 30 tablet 12  . BENZOYL PEROXIDE 5 % external wash USE TO WASH AFFECTED AREA DAILY 237 g 0  . bisacodyl (DULCOLAX) 10 MG suppository Place 1 suppository (10 mg total) rectally daily as needed for moderate constipation. 12 suppository 0  . calcium-vitamin D (OSCAL WITH D) 500-200 MG-UNIT per tablet Take 1 tablet by mouth daily with  breakfast. 30 tablet 12  . clindamycin (CLEOCIN T) 1 % external solution Apply topically daily. 60 mL 12  . clotrimazole (LOTRIMIN) 1 % cream Apply 1 application topically 2 (two) times daily. For rash in skin folds    . DULoxetine (CYMBALTA) 60 MG capsule Take 1 capsule (60 mg total) by mouth daily. 30 capsule 12  . HYDROcodone-acetaminophen (NORCO/VICODIN) 5-325 MG tablet Take 1 tablet by mouth every 6 (six) hours as needed for moderate pain. 30 tablet 0  . lamoTRIgine (LAMICTAL) 150 MG tablet Take 2 tablets (300 mg total) by mouth 2 (two) times daily. 60 tablet 12  . letrozole (FEMARA) 2.5 MG tablet Take 1 tablet (2.5 mg total) by mouth daily. 30 tablet 12  . loratadine (CLARITIN) 10 MG tablet Take 1 tablet (10 mg total) by mouth daily. 30 tablet 12  . LORazepam (ATIVAN) 1 MG tablet Take 1 tablet (1 mg total) by mouth every 8 (eight) hours as needed (take one tablet by mouth as needed for seizure lasting longer than 3 min.  or more than 2 seizures in 2 hours). 25 tablet 0  . losartan (COZAAR) 100 MG tablet Take 1 tablet (100 mg total) by mouth daily. 30 tablet 12  . meloxicam (MOBIC) 7.5 MG tablet Take 1 tablet (7.5 mg total) by mouth 2 (two) times daily. With meals 60 tablet 12  . omeprazole (PRILOSEC) 20 MG capsule TAKE 1 CAPSULE BY MOUTH TWICE DAILY FOR GERD. (DO NOT CRUSH) 60 capsule 12  . terbinafine (LAMISIL) 1 % cream Apply to affected area 2 times daily until clear and then 2 times a week for suppression (Patient taking differently: Apply to affected area 2 times daily until clear and then 2 times a week for suppression ( on feet)) 30 g 2  . docusate sodium (COLACE) 100 MG capsule Take 1 capsule (100 mg total) by mouth 2 (two) times daily. 60 capsule 12  . guaiFENesin (MUCINEX) 600 MG 12 hr tablet Take 1 tablet (600 mg total) by mouth 2 (two) times daily. 30 tablet 0  . polyethylene glycol (MIRALAX / GLYCOLAX) packet Take 17 g by mouth daily. (Patient not taking: Reported on 06/01/2015) 14  each 0   No facility-administered medications prior to visit.    Allergies  Allergen Reactions  . Sulfa Antibiotics Other (See Comments)    "Does not know"  . Keflex [Cephalexin] Other (See Comments)    "Does not know" unknown    Patient Care Team: Jerrol Banana., MD as PCP - General (Family Medicine) Jerrol Banana., MD (Family Medicine) Robert Bellow, MD (General Surgery)  Objective:   Vitals:  Filed Vitals:   06/27/15 1048  BP: 130/84  Pulse: 100  Resp: 16    Physical Exam  Constitutional: She appears well-developed and well-nourished.  HENT:  Head: Normocephalic and atraumatic.  Right Ear: External ear normal.  Left Ear: External ear normal.  Nose: Nose normal.  Mouth/Throat: Oropharynx is clear and moist.  Eyes:  Conjunctivae and EOM are normal. Pupils are equal, round, and reactive to light.  Neck: Normal range of motion. Neck supple.  Cardiovascular: Normal rate, regular rhythm, normal heart sounds and intact distal pulses.   Pulmonary/Chest: Effort normal and breath sounds normal.  Abdominal: Soft. Bowel sounds are normal.  Musculoskeletal: Normal range of motion.  Neurological: She is alert. No cranial nerve deficit.  Skin: Skin is warm and dry.  Psychiatric: She has a normal mood and affect. Her behavior is normal. Judgment and thought content normal.    Activities of Daily Living In your present state of health, do you have any difficulty performing the following activities: 06/27/2015 05/19/2015  Hearing? N N  Vision? N N  Difficulty concentrating or making decisions? N Y  Walking or climbing stairs? Y Y  Dressing or bathing? Y Y  Doing errands, shopping? Y N    Fall Risk Assessment Fall Risk  06/27/2015  Falls in the past year? Yes  Number falls in past yr: 2 or more  Injury with Fall? Yes     Depression Screen PHQ 2/9 Scores 06/27/2015  PHQ - 2 Score 0    Cognitive Testing - 6-CIT    Year: 0 4 points  Month: 0 3  points  Memorize "Pia Mau, [address], [place]"  Time (within 1 hour:) 0 3 points  Count backwards from 20: 0 2 4 points  Name months of year: 0 2 4 points  Repeat Address: 0 2 4 6 8 10  points   Total Score: Unable to complete/28  Interpretation : Normal (0-7) Abnormal (8-28)    Assessment & Plan:     Annual Wellness Visit  Reviewed patient's Family Medical History Reviewed and updated list of patient's medical providers Assessment of cognitive impairment was done Assessed patient's functional ability Established a written schedule for health screening [place] Completed and Reviewed  Exercise Activities and Dietary recommendations Goals    None      Immunization History  Administered Date(s) Administered  . Influenza,inj,Quad PF,36+ Mos 10/29/2014    Health Maintenance  Topic Date Due  . Hepatitis C Screening  May 11, 1960  . HIV Screening  03/31/1973  . TETANUS/TDAP  03/31/1977  . PAP SMEAR  04/01/1979  . COLONOSCOPY  03/31/2008  . INFLUENZA VACCINE  09/12/2015  . MAMMOGRAM  11/07/2016      Discussed health benefits of physical activity, and encouraged her to engage in regular exercise appropriate for her age and condition.   Pneumonia--chest x-ray today which is 6 weeks after initial diagnosis. She has been changed to special diet and try to lower the risk of aspiration which I think is happening regularly. May need pulmonary referral to deal with the issue for this surgery. If chest x-ray is clear I think she is medically okay to proceed with surgery. I'm not sure that there is anything else that can be done for this situation. With her mental retardation not think she has very limited understanding of this issue. She is very comfortable with her caregiver who is been taking care of her for many years. There is some communication witnessed between patient and her caregiver. She is starting to in her own way communicate with me to a degree  now after about 20 years. Microcephaly/mental retardation Chronic anxiety End-stage osteoarthritis--patient needs joint replacement surgery Seizures I have done the exam and reviewed the above chart and it is accurate to the best of my knowledge.  Helen Aschoff MD [facility]  [facility] 06/27/2015 10:54 AM  ------------------------------------------------------------------------------------------------------------

## 2015-06-28 ENCOUNTER — Institutional Professional Consult (permissible substitution): Payer: Medicare Other | Admitting: Internal Medicine

## 2015-06-28 NOTE — Telephone Encounter (Signed)
-----   Message from Jerrol Banana., MD sent at 06/28/2015  4:43 AM EDT ----- CXR not cleared. No antibiotics for now. Would refer to pulmonary for clearance for surgery.

## 2015-06-28 NOTE — Telephone Encounter (Signed)
Helen Patton and patient's mother advised of results, order put in-aa

## 2015-07-04 ENCOUNTER — Encounter: Payer: Self-pay | Admitting: Internal Medicine

## 2015-07-04 ENCOUNTER — Ambulatory Visit (INDEPENDENT_AMBULATORY_CARE_PROVIDER_SITE_OTHER): Payer: Medicare Other | Admitting: Internal Medicine

## 2015-07-04 VITALS — BP 124/80 | HR 86 | Ht 60.0 in | Wt 145.0 lb

## 2015-07-04 DIAGNOSIS — R911 Solitary pulmonary nodule: Secondary | ICD-10-CM

## 2015-07-04 DIAGNOSIS — C50919 Malignant neoplasm of unspecified site of unspecified female breast: Secondary | ICD-10-CM | POA: Diagnosis not present

## 2015-07-04 NOTE — Patient Instructions (Addendum)
Will send for PET scan. Call us after the test is done and will follow up via telephone.

## 2015-07-04 NOTE — Progress Notes (Signed)
[name]      Assessment and Plan:  Preoperative pulmonary evaluation. -This patient is at moderate to high risk of pulmonary complications with surgery, these were explained in detail today, which include aspiration pneumonia, pneumothorax, or prolonged need for mechanical ventilatory support. -Patient should remain on antireflux therapy before and after surgery. -The patient should avoid this elective surgery if they're feeling likely have a chest cold, or other respiratory problems. -I explained today that it would be difficult to reduce her risk significantly, given her cognitive dysfunction, inability to deep breathe, and high likelihood of aspiration the future.  Aspiration pneumonia. -Chest x-ray reviewed, there is resolving changes of recent aspiration pneumonia. -Patient has a known history of aspiration pneumonia, as well as a documented dysphagia, as related by the patient's group home provider.  Breast cancer. -The patient has a history of lumpectomy for previous Breast cancer. -We had a long discussion today regards to possible workup for this. We decided to send the patient for a PET scan, with the understanding that this may not be definitive test, but it if it is strongly positive, then we would likely postpone the elective hip replacement surgery. If the test is indeterminate or negative, then we would proceed with surgery, and consider any possible further workup for these changes after she has completed her surgery and recovery.  Hiatal hernia. -Likely contributory to recent episode of aspiration pneumonia. Would maintain this patient on anti-reflux precautions, as well as antacid medication.   Date: 07/04/2015  MRN# [id number] Helen Patton [birth year]  Referring Physician: Dr. Teena Dunk is a 55 y.o. old female seen in consultation for chief complaint of:    Chief Complaint  Patient presents with  . pulmonary  consult    pt ref by dr.gilbert.     HPI:   The patient is a 55 yo female group home resident with a history mental retardation. She had a recent episode of aspiration pneumonia. She is present with her mother and caregiver from group home. She is planned for a right hip surgery due to chronic DJD.  She has been cleared twice for surgery in the past, but on the first occasion she had a bad cough which was thought to be aspiration pneumonia.  On the second occasion she was given for agitation and anxiety, and on that occasion she was very sedated, sats were low. Then she started coughing again.   She is need of right THA, she has had a repair in the past. She is now wheelchair bound because of pain in the right hip and continue dysfunction.   Care giver notes no baseline problems in her breathing. She has had a history of lumpectomy in April of 2016, she has been on femara since that time. She never had undergone chemo or radiation. She is now on mechanically chopped food; she eats quickly. When she eats she holds food in her mouth, sometimes she has trouble swallowing. This has been better since they have been chopping her food and limiting her spoon sizes. She has been doing that for about 1 month.   Review of images from CXR 5/16 and CT chest 05/22/15; There is a hiatal hernia, scatted pneumonia changes in RML and LLL, poor inspiratory film with bibasilar atelectasis.    PMHX:   Past Medical History  Diagnosis Date  . Mental retardation   . GERD (gastroesophageal reflux disease)   . Osteoporosis   . Hypertension   .  Seizures ([place])   . MRSA (methicillin resistant staph aureus) culture positive 2012  . Hidradenitis 2012    MRSA, on chronic topical suppression  . Cancer [facility]) Oct 2015    invasive mammary carcinoma  . Breast cancer ([place]) 2015    [name]  . Falls    Surgical Hx:  Past Surgical History  Procedure Laterality Date  . Broken leg    . Dislocated hip    .  Cervical ablation    . Breast surgery Left 12-07-13    T1b, Nx; ER+, PR+, her 2 pending. (Not candidate for chemotherapy)   Family Hx:  Family History  Problem Relation Age of Onset  . Diabetes Father   . Parkinson's disease Father   . Cancer Maternal Aunt 63    breast  . Breast cancer Maternal Aunt   . Neuropathy Mother   . Arthritis Mother   . Diabetes Mother   . Autoimmune disease Brother     AIDS  . Hypertension Maternal Grandmother   . Cancer Paternal Grandmother    Social Hx:   Social History  Substance Use Topics  . Smoking status: Never Smoker   . Smokeless tobacco: Never Used  . Alcohol Use: No   Medication:   Current Outpatient Rx  Name  Route  Sig  Dispense  Refill  . alendronate (FOSAMAX) 70 MG tablet      1 TAB PO WEEKLY EARLY AM BEFORE FOOD/MEDS W/WATER. DON'T LIE DOWN FOR30 MIN (OSTEOPOROSIS) *NO CRUSH*   5 tablet   12   . amLODipine (NORVASC) 10 MG tablet   Oral   Take 1 tablet (10 mg total) by mouth daily.   30 tablet   12   . BENZOYL PEROXIDE 5 % external wash      USE TO WASH AFFECTED AREA DAILY   237 g   0   . bisacodyl (DULCOLAX) 10 MG suppository   Rectal   Place 1 suppository (10 mg total) rectally daily as needed for moderate constipation.   12 suppository   0   . calcium-vitamin D (OSCAL WITH D) 500-200 MG-UNIT per tablet   Oral   Take 1 tablet by mouth daily with breakfast.   30 tablet   12   . clindamycin (CLEOCIN T) 1 % external solution   Topical   Apply topically daily.   60 mL   12   . clotrimazole (LOTRIMIN) 1 % cream   Topical   Apply 1 application topically 2 (two) times daily. For rash in skin folds         . DULoxetine (CYMBALTA) 60 MG capsule   Oral   Take 1 capsule (60 mg total) by mouth daily.   30 capsule   12   . HYDROcodone-acetaminophen (NORCO/VICODIN) 5-325 MG tablet   Oral   Take 1 tablet by mouth every 6 (six) hours as needed for moderate pain.   30 tablet   0   . lamoTRIgine (LAMICTAL)  150 MG tablet   Oral   Take 2 tablets (300 mg total) by mouth 2 (two) times daily.   60 tablet   12   . letrozole (FEMARA) 2.5 MG tablet   Oral   Take 1 tablet (2.5 mg total) by mouth daily.   30 tablet   12   . loratadine (CLARITIN) 10 MG tablet   Oral   Take 1 tablet (10 mg total) by mouth daily.   30 tablet   12   .  LORazepam (ATIVAN) 1 MG tablet   Oral   Take 1 tablet (1 mg total) by mouth every 8 (eight) hours as needed (take one tablet by mouth as needed for seizure lasting longer than 3 min.  or more than 2 seizures in 2 hours).   25 tablet   0   . losartan (COZAAR) 100 MG tablet   Oral   Take 1 tablet (100 mg total) by mouth daily.   30 tablet   12   . meloxicam (MOBIC) 7.5 MG tablet   Oral   Take 1 tablet (7.5 mg total) by mouth 2 (two) times daily. With meals   60 tablet   12   . omeprazole (PRILOSEC) 20 MG capsule      TAKE 1 CAPSULE BY MOUTH TWICE DAILY FOR GERD. (DO NOT CRUSH)   60 capsule   12   . terbinafine (LAMISIL) 1 % cream      Apply to affected area 2 times daily until clear and then 2 times a week for suppression Patient taking differently: Apply to affected area 2 times daily until clear and then 2 times a week for suppression ( on feet)   30 g   2       Allergies:  Sulfa antibiotics and Keflex  Review of Systems: Pt can not provide a ROS due to developmental disability.   Physical Examination:   VS: BP 124/80 mmHg  Pulse 86  Ht 5' (1.524 m)  Wt 145 lb (65.772 kg)  BMI 28.32 kg/m2  SpO2 96%  General Appearance: No distress  Neuro:without focal findings,  speech normal,  HEENT: PERRLA, EOM intact.   Pulmonary: normal breath sounds, No wheezing.  CardiovascularNormal S1,S2.  No m/r/g.   Abdomen: Benign, Soft, non-tender. Renal:  No costovertebral tenderness  GU:  No performed at this time. Endoc: No evident thyromegaly, no signs of acromegaly. Skin:   warm, no rashes, no ecchymosis  Extremities: normal, no cyanosis,  clubbing.  Other findings:    LABORATORY PANEL:   CBC No results for input(s): WBC, HGB, HCT, PLT in the last 168 hours. ------------------------------------------------------------------------------------------------------------------  Chemistries  No results for input(s): NA, K, CL, CO2, GLUCOSE, BUN, CREATININE, CALCIUM, MG, AST, ALT, ALKPHOS, BILITOT in the last 168 hours.  Invalid input(s): GFRCGP ------------------------------------------------------------------------------------------------------------------  Cardiac Enzymes No results for input(s): TROPONINI in the last 168 hours. ------------------------------------------------------------  RADIOLOGY:  No results found.     Thank  you for the consultation and for allowing [facility] to assist in the care of your patient. Our recommendations are noted above.  Please contact us if we can be of further service.   Marda Stalker, MD.  Board Certified in Internal Medicine, Pulmonary Medicine, [place], and Sleep Medicine.  [facility] Office Number: [phone]  [name] Pesa, M.D.  Vilinda Boehringer, M.D.  Merton Border, M.D  07/04/2015

## 2015-07-05 ENCOUNTER — Telehealth: Payer: Self-pay | Admitting: Family Medicine

## 2015-07-05 NOTE — Telephone Encounter (Signed)
Helen Patton with pt's group home would like orders faxed for a seatbelt for pt's wheelchair b/c pt has been removing the van belts while in transit. Helen Patton stated pt sees Helen Patton but wanted to see if they could go ahead and get the orders. Fax# is in contact info of this message. Helen Patton was advised that Helen Patton is out of the office until 07/11/15. Please advise. Thanks TNP

## 2015-07-05 NOTE — Telephone Encounter (Signed)
OK to send order. Thanks.

## 2015-07-06 NOTE — Telephone Encounter (Signed)
Order written and will be faxed later this morning after getting Dr Cathie Hoops signature-aa

## 2015-07-07 ENCOUNTER — Ambulatory Visit
Admission: RE | Admit: 2015-07-07 | Discharge: 2015-07-07 | Disposition: A | Discharge status: Home / Self Care | Payer: Medicare Other | Source: Ambulatory Visit | Attending: Internal Medicine | Admitting: Internal Medicine

## 2015-07-07 DIAGNOSIS — R911 Solitary pulmonary nodule: Secondary | ICD-10-CM

## 2015-07-07 DIAGNOSIS — Z853 Personal history of malignant neoplasm of breast: Secondary | ICD-10-CM | POA: Diagnosis not present

## 2015-07-07 DIAGNOSIS — Z9689 Presence of other specified functional implants: Secondary | ICD-10-CM | POA: Diagnosis not present

## 2015-07-07 LAB — GLUCOSE, CAPILLARY: GLUCOSE-CAPILLARY: 87 mg/dL (ref 65–99)

## 2015-07-07 MED ORDER — FLUDEOXYGLUCOSE F - 18 (FDG) INJECTION
11.8700 | Freq: Once | INTRAVENOUS | Status: AC | PRN
  Administered 2015-07-07: 11.87 via INTRAVENOUS

## 2015-07-17 ENCOUNTER — Telehealth: Payer: Self-pay | Admitting: Internal Medicine

## 2015-07-17 NOTE — Telephone Encounter (Signed)
Pt mother would like PET scan results. Please call.

## 2015-07-17 NOTE — Telephone Encounter (Signed)
Please advise on PET results 

## 2015-07-18 NOTE — Telephone Encounter (Signed)
Pt mother calling back to get results. Please call

## 2015-07-18 NOTE — Telephone Encounter (Signed)
LM on VM that message has been sent to DR to advise on PET scan results.  DR please advise.

## 2015-07-20 ENCOUNTER — Telehealth: Payer: Self-pay | Admitting: Internal Medicine

## 2015-07-20 NOTE — Telephone Encounter (Signed)
Spoke with daughter and informed her of PET results and that pt is cleared for surgery. Nothing further needed.

## 2015-07-20 NOTE — Telephone Encounter (Signed)
PET scan reviewed, there are no suspicious nodules or evidence of any recurrence. The patient may proceed with surgery as discussed before, and follow up with [place] on an as needed basis.

## 2015-07-20 NOTE — Telephone Encounter (Signed)
Becky brown from group home where patient is at.  Would like to know if we send over the results and give them a call as well and also give Dr Clydell Hakim office a call and/or send then the results so they can have it on file.  For they are waiting so they can do Hip surgery on patient. Please advise.   Fax a copy to group home at fax: [phone]

## 2015-07-20 NOTE — Telephone Encounter (Signed)
Pt is returning our call Please call she is a bit upset why its taking 2 weeks for them to get these results.

## 2015-07-20 NOTE — Telephone Encounter (Signed)
Please advise on PET results 

## 2015-07-21 ENCOUNTER — Encounter: Payer: Self-pay | Admitting: *Deleted

## 2015-07-21 NOTE — Telephone Encounter (Signed)
Spoke with becky and will fax copy of PET report. Informed if the surgeon needs anything further they can give [place] a call. Nothing further needed.

## 2015-07-28 DIAGNOSIS — M8700 Idiopathic aseptic necrosis of unspecified bone: Secondary | ICD-10-CM | POA: Diagnosis not present

## 2015-07-28 DIAGNOSIS — M87051 Idiopathic aseptic necrosis of right femur: Secondary | ICD-10-CM | POA: Diagnosis not present

## 2015-08-09 ENCOUNTER — Encounter
Admission: RE | Admit: 2015-08-09 | Discharge: 2015-08-09 | Disposition: A | Discharge status: Home / Self Care | Payer: Medicare Other | Source: Ambulatory Visit | Attending: Orthopedic Surgery | Admitting: Orthopedic Surgery

## 2015-08-09 DIAGNOSIS — K219 Gastro-esophageal reflux disease without esophagitis: Secondary | ICD-10-CM | POA: Insufficient documentation

## 2015-08-09 DIAGNOSIS — E785 Hyperlipidemia, unspecified: Secondary | ICD-10-CM | POA: Insufficient documentation

## 2015-08-09 DIAGNOSIS — Z853 Personal history of malignant neoplasm of breast: Secondary | ICD-10-CM | POA: Diagnosis not present

## 2015-08-09 DIAGNOSIS — I10 Essential (primary) hypertension: Secondary | ICD-10-CM | POA: Diagnosis not present

## 2015-08-09 DIAGNOSIS — Z01812 Encounter for preprocedural laboratory examination: Secondary | ICD-10-CM | POA: Insufficient documentation

## 2015-08-09 DIAGNOSIS — Z8614 Personal history of Methicillin resistant Staphylococcus aureus infection: Secondary | ICD-10-CM | POA: Insufficient documentation

## 2015-08-09 DIAGNOSIS — M87851 Other osteonecrosis, right femur: Secondary | ICD-10-CM | POA: Diagnosis not present

## 2015-08-09 HISTORY — DX: Hyperlipidemia, unspecified: E78.5

## 2015-08-09 HISTORY — DX: Depression, unspecified: F32.A

## 2015-08-09 HISTORY — DX: Unspecified osteoarthritis, unspecified site: M19.90

## 2015-08-09 HISTORY — DX: Major depressive disorder, single episode, unspecified: F32.9

## 2015-08-09 HISTORY — DX: Pneumonia, unspecified organism: J18.9

## 2015-08-09 HISTORY — DX: Personal history of other diseases of the digestive system: Z87.19

## 2015-08-09 HISTORY — DX: Anxiety disorder, unspecified: F41.9

## 2015-08-09 HISTORY — DX: Dysphagia, unspecified: R13.10

## 2015-08-09 LAB — COMPREHENSIVE METABOLIC PANEL
ALBUMIN: 4.4 g/dL (ref 3.5–5.0)
ALK PHOS: 84 U/L (ref 38–126)
ALT: 14 U/L (ref 14–54)
ANION GAP: 7 (ref 5–15)
AST: 18 U/L (ref 15–41)
BUN: 17 mg/dL (ref 6–20)
CALCIUM: 9.9 mg/dL (ref 8.9–10.3)
CHLORIDE: 101 mmol/L (ref 101–111)
CO2: 33 mmol/L — AB (ref 22–32)
Creatinine, Ser: 0.6 mg/dL (ref 0.44–1.00)
GFR calc non Af Amer: >60 mL/min (ref >60)
GLUCOSE: 90 mg/dL (ref 65–99)
POTASSIUM: 4.3 mmol/L (ref 3.5–5.1)
SODIUM: 141 mmol/L (ref 135–145)
Total Bilirubin: 0.4 mg/dL (ref 0.3–1.2)
Total Protein: 7 g/dL (ref 6.5–8.1)

## 2015-08-09 LAB — CBC
HCT: 36.6 % (ref 35.0–47.0)
Hemoglobin: 12.3 g/dL (ref 12.0–16.0)
MCH: 30.5 pg (ref 26.0–34.0)
MCHC: 33.7 g/dL (ref 32.0–36.0)
MCV: 90.4 fL (ref 80.0–100.0)
PLATELETS: 326 10*3/uL (ref 150–440)
RBC: 4.05 MIL/uL (ref 3.80–5.20)
RDW: 14.4 % (ref 11.5–14.5)
WBC: 8.2 10*3/uL (ref 3.6–11.0)

## 2015-08-09 LAB — SURGICAL PCR SCREEN
MRSA, PCR: NEGATIVE
STAPHYLOCOCCUS AUREUS: NEGATIVE

## 2015-08-09 LAB — APTT: APTT: 35 s (ref 24–36)

## 2015-08-09 LAB — SEDIMENTATION RATE: SED RATE: 16 mm/h (ref 0–30)

## 2015-08-09 LAB — PROTIME-INR
INR: 0.85
Prothrombin Time: 11.9 seconds (ref 11.4–15.0)

## 2015-08-09 NOTE — Patient Instructions (Signed)
  Your procedure is scheduled TD:[id number] 12, 2017 (Wednesday) Report to Same Day Surgery 2nd floor  [facility] To find out your arrival time please call [phone] between 1PM - 3PM on August 22, 2015 (Tuesday)  Remember: Instructions that are not followed completely may result in serious medical risk, up to and including death, or upon the discretion of your surgeon and anesthesiologist your surgery may need to be rescheduled.    _x___ 1. Do not eat food or drink liquids after midnight. No gum chewing or hard candies.     __x__ 2. No Alcohol for 24 hours before or after surgery.   __x__3. No Smoking for 24 prior to surgery.   ____  4. Bring all medications with you on the day of surgery if instructed.    __x__ 5. Notify your doctor if there is any change in your medical condition     (cold, fever, infections).     Do not wear jewelry, make-up, hairpins, clips or nail polish.  Do not wear lotions, powders, or perfumes. You may wear deodorant.  Do not shave 48 hours prior to surgery. Men may shave face and neck.  Do not bring valuables to the hospital.    [facility] is not responsible for any belongings or valuables.               Contacts, dentures or bridgework may not be worn into surgery.  Leave your suitcase in the car. After surgery it may be brought to your room.  For patients admitted to the hospital, discharge time is determined by your treatment team.   Patients discharged the day of surgery will not be allowed to drive home.    Please read over the following fact sheets that you were given:   [facility] Preparing for Surgery and or MRSA Information   _x___ Take these medicines the morning of surgery with A SIP OF WATER:    1. LAMICTAL  2.LOSARTAN  3.OMEPRAZOLE  4.  5.  6.  ____ Fleet Enema (as directed)   _x___ Use CHG Soap or sage wipes as directed on instruction sheet   ____ Use inhalers on the day of surgery and bring to hospital day of surgery  ____  Stop metformin 2 days prior to surgery    ____ Take 1/2 of usual insulin dose the night before surgery and none on the morning of surgery             __x__ Stop aspirin or coumadin, or plavix (NO ASPIRIN OR ASPIRIN PRODUCTS)  _x__ Stop Anti-inflammatories such as Advil, Aleve, Ibuprofen, Motrin, Naproxen,          Naprosyn, Goodies powders or aspirin products. (STOP MOBIC TEN DAYS PRIOR TO SURGERY)        Ok to take Tylenol.   ____ Stop supplements until after surgery.    ____ Bring C-Pap to the hospital.

## 2015-08-09 NOTE — Pre-Procedure Instructions (Signed)
Dr. Marcello Moores made aware of patient cleared for surgery at moderate to high risk for pulmonary complications.

## 2015-08-14 ENCOUNTER — Other Ambulatory Visit
Admission: RE | Admit: 2015-08-14 | Discharge: 2015-08-14 | Disposition: A | Discharge status: Home / Self Care | Payer: Medicare Other | Source: Ambulatory Visit | Attending: Orthopedic Surgery | Admitting: Orthopedic Surgery

## 2015-08-14 DIAGNOSIS — R410 Disorientation, unspecified: Secondary | ICD-10-CM | POA: Insufficient documentation

## 2015-08-14 LAB — URINALYSIS COMPLETE WITH MICROSCOPIC (ARMC ONLY)
Bilirubin Urine: NEGATIVE
Glucose, UA: NEGATIVE mg/dL
Hgb urine dipstick: NEGATIVE
Ketones, ur: NEGATIVE mg/dL
Nitrite: NEGATIVE
PROTEIN: NEGATIVE mg/dL
SPECIFIC GRAVITY, URINE: 1.011 (ref 1.005–1.030)
pH: 6 (ref 5.0–8.0)

## 2015-08-15 LAB — URINE CULTURE

## 2015-08-23 ENCOUNTER — Inpatient Hospital Stay
Admission: RE | Admit: 2015-08-23 | Discharge: 2015-08-26 | DRG: 470 | Disposition: A | Discharge status: Skilled Nursing Facility | Payer: Medicare Other | Source: Ambulatory Visit | Attending: Orthopedic Surgery | Admitting: Orthopedic Surgery

## 2015-08-23 ENCOUNTER — Encounter
Admission: RE | Disposition: A | Discharge status: Skilled Nursing Facility | Payer: Self-pay | Source: Ambulatory Visit | Attending: Orthopedic Surgery

## 2015-08-23 ENCOUNTER — Inpatient Hospital Stay: Payer: Medicare Other | Admitting: Certified Registered"

## 2015-08-23 ENCOUNTER — Inpatient Hospital Stay: Payer: Medicare Other

## 2015-08-23 ENCOUNTER — Encounter: Payer: Self-pay | Admitting: *Deleted

## 2015-08-23 DIAGNOSIS — I10 Essential (primary) hypertension: Secondary | ICD-10-CM | POA: Diagnosis present

## 2015-08-23 DIAGNOSIS — F329 Major depressive disorder, single episode, unspecified: Secondary | ICD-10-CM | POA: Diagnosis present

## 2015-08-23 DIAGNOSIS — Z9181 History of falling: Secondary | ICD-10-CM | POA: Diagnosis not present

## 2015-08-23 DIAGNOSIS — G40909 Epilepsy, unspecified, not intractable, without status epilepticus: Secondary | ICD-10-CM | POA: Diagnosis not present

## 2015-08-23 DIAGNOSIS — Z7983 Long term (current) use of bisphosphonates: Secondary | ICD-10-CM

## 2015-08-23 DIAGNOSIS — G809 Cerebral palsy, unspecified: Secondary | ICD-10-CM | POA: Diagnosis not present

## 2015-08-23 DIAGNOSIS — M87851 Other osteonecrosis, right femur: Secondary | ICD-10-CM | POA: Diagnosis not present

## 2015-08-23 DIAGNOSIS — M6281 Muscle weakness (generalized): Secondary | ICD-10-CM | POA: Diagnosis not present

## 2015-08-23 DIAGNOSIS — Z853 Personal history of malignant neoplasm of breast: Secondary | ICD-10-CM

## 2015-08-23 DIAGNOSIS — E785 Hyperlipidemia, unspecified: Secondary | ICD-10-CM | POA: Diagnosis present

## 2015-08-23 DIAGNOSIS — M25551 Pain in right hip: Secondary | ICD-10-CM | POA: Diagnosis not present

## 2015-08-23 DIAGNOSIS — F419 Anxiety disorder, unspecified: Secondary | ICD-10-CM | POA: Diagnosis present

## 2015-08-23 DIAGNOSIS — Z471 Aftercare following joint replacement surgery: Secondary | ICD-10-CM | POA: Diagnosis not present

## 2015-08-23 DIAGNOSIS — Z833 Family history of diabetes mellitus: Secondary | ICD-10-CM

## 2015-08-23 DIAGNOSIS — Z803 Family history of malignant neoplasm of breast: Secondary | ICD-10-CM | POA: Diagnosis not present

## 2015-08-23 DIAGNOSIS — R1310 Dysphagia, unspecified: Secondary | ICD-10-CM | POA: Diagnosis present

## 2015-08-23 DIAGNOSIS — Z791 Long term (current) use of non-steroidal anti-inflammatories (NSAID): Secondary | ICD-10-CM | POA: Diagnosis not present

## 2015-08-23 DIAGNOSIS — M1611 Unilateral primary osteoarthritis, right hip: Secondary | ICD-10-CM | POA: Diagnosis present

## 2015-08-23 DIAGNOSIS — R262 Difficulty in walking, not elsewhere classified: Secondary | ICD-10-CM | POA: Diagnosis not present

## 2015-08-23 DIAGNOSIS — Z96649 Presence of unspecified artificial hip joint: Secondary | ICD-10-CM

## 2015-08-23 DIAGNOSIS — R1312 Dysphagia, oropharyngeal phase: Secondary | ICD-10-CM | POA: Diagnosis not present

## 2015-08-23 DIAGNOSIS — Z8249 Family history of ischemic heart disease and other diseases of the circulatory system: Secondary | ICD-10-CM | POA: Diagnosis not present

## 2015-08-23 DIAGNOSIS — Z79818 Long term (current) use of other agents affecting estrogen receptors and estrogen levels: Secondary | ICD-10-CM | POA: Diagnosis not present

## 2015-08-23 DIAGNOSIS — M810 Age-related osteoporosis without current pathological fracture: Secondary | ICD-10-CM | POA: Diagnosis present

## 2015-08-23 DIAGNOSIS — M87251 Osteonecrosis due to previous trauma, right femur: Principal | ICD-10-CM | POA: Diagnosis present

## 2015-08-23 DIAGNOSIS — Z96641 Presence of right artificial hip joint: Secondary | ICD-10-CM | POA: Diagnosis not present

## 2015-08-23 DIAGNOSIS — Z8261 Family history of arthritis: Secondary | ICD-10-CM

## 2015-08-23 DIAGNOSIS — K449 Diaphragmatic hernia without obstruction or gangrene: Secondary | ICD-10-CM | POA: Diagnosis not present

## 2015-08-23 DIAGNOSIS — Z79811 Long term (current) use of aromatase inhibitors: Secondary | ICD-10-CM | POA: Diagnosis not present

## 2015-08-23 DIAGNOSIS — Z9989 Dependence on other enabling machines and devices: Secondary | ICD-10-CM | POA: Diagnosis not present

## 2015-08-23 DIAGNOSIS — R569 Unspecified convulsions: Secondary | ICD-10-CM | POA: Diagnosis not present

## 2015-08-23 DIAGNOSIS — R293 Abnormal posture: Secondary | ICD-10-CM | POA: Diagnosis not present

## 2015-08-23 DIAGNOSIS — Z79899 Other long term (current) drug therapy: Secondary | ICD-10-CM

## 2015-08-23 DIAGNOSIS — M8700 Idiopathic aseptic necrosis of unspecified bone: Secondary | ICD-10-CM | POA: Diagnosis not present

## 2015-08-23 DIAGNOSIS — F79 Unspecified intellectual disabilities: Secondary | ICD-10-CM | POA: Diagnosis present

## 2015-08-23 DIAGNOSIS — K219 Gastro-esophageal reflux disease without esophagitis: Secondary | ICD-10-CM | POA: Diagnosis present

## 2015-08-23 DIAGNOSIS — R4189 Other symptoms and signs involving cognitive functions and awareness: Secondary | ICD-10-CM | POA: Diagnosis not present

## 2015-08-23 HISTORY — PX: TOTAL HIP REVISION: SHX763

## 2015-08-23 HISTORY — PX: CONVERSION TO TOTAL HIP: SHX5784

## 2015-08-23 HISTORY — PX: HARDWARE REMOVAL: SHX979

## 2015-08-23 LAB — TYPE AND SCREEN
ABO/RH(D): O POS
ANTIBODY SCREEN: NEGATIVE

## 2015-08-23 SURGERY — TOTAL HIP REVISION
Anesthesia: General | Laterality: Right | Wound class: Clean

## 2015-08-23 MED ORDER — TERBINAFINE HCL 1 % EX CREA
TOPICAL_CREAM | Freq: Two times a day (BID) | CUTANEOUS | Status: DC | PRN
  Filled 2015-08-23: qty 12

## 2015-08-23 MED ORDER — GLYCOPYRROLATE 0.2 MG/ML IJ SOLN
INTRAMUSCULAR | Status: DC | PRN
  Administered 2015-08-23: 0.4 mg via INTRAVENOUS

## 2015-08-23 MED ORDER — PROPOFOL 10 MG/ML IV BOLUS
INTRAVENOUS | Status: DC | PRN
  Administered 2015-08-23: 30 mg via INTRAVENOUS
  Administered 2015-08-23: 120 mg via INTRAVENOUS

## 2015-08-23 MED ORDER — LORATADINE 10 MG PO TABS
10.0000 mg | ORAL_TABLET | Freq: Every day | ORAL | Status: DC
  Administered 2015-08-24 – 2015-08-26 (×3): 10 mg via ORAL
  Filled 2015-08-23 (×3): qty 1

## 2015-08-23 MED ORDER — LETROZOLE 2.5 MG PO TABS
2.5000 mg | ORAL_TABLET | Freq: Every day | ORAL | Status: DC
  Administered 2015-08-24 – 2015-08-26 (×3): 2.5 mg via ORAL
  Filled 2015-08-23 (×4): qty 1

## 2015-08-23 MED ORDER — LOSARTAN POTASSIUM 50 MG PO TABS
100.0000 mg | ORAL_TABLET | Freq: Every day | ORAL | Status: DC
  Administered 2015-08-24 – 2015-08-26 (×3): 100 mg via ORAL
  Filled 2015-08-23 (×3): qty 2

## 2015-08-23 MED ORDER — PHENOL 1.4 % MT LIQD
1.0000 | OROMUCOSAL | Status: DC | PRN
Start: 2015-08-23 — End: 2015-08-26
  Filled 2015-08-23: qty 177

## 2015-08-23 MED ORDER — CHLORHEXIDINE GLUCONATE 4 % EX LIQD: 60.0000 mL | Freq: Once | CUTANEOUS | Status: DC

## 2015-08-23 MED ORDER — CLINDAMYCIN PHOSPHATE 900 MG/50ML IV SOLN
900.0000 mg | INTRAVENOUS | Status: DC
Start: 2015-08-23 — End: 2015-08-23

## 2015-08-23 MED ORDER — LACTATED RINGERS IV SOLN
INTRAVENOUS | Status: DC
  Administered 2015-08-23 (×3): via INTRAVENOUS

## 2015-08-23 MED ORDER — ONDANSETRON HCL 4 MG/2ML IJ SOLN
INTRAMUSCULAR | Status: DC | PRN
  Administered 2015-08-23: 4 mg via INTRAVENOUS

## 2015-08-23 MED ORDER — FERROUS SULFATE 325 (65 FE) MG PO TABS
325.0000 mg | ORAL_TABLET | Freq: Two times a day (BID) | ORAL | Status: DC
  Administered 2015-08-24 – 2015-08-26 (×4): 325 mg via ORAL
  Filled 2015-08-23 (×5): qty 1

## 2015-08-23 MED ORDER — PHENYLEPHRINE HCL 10 MG/ML IJ SOLN
INTRAMUSCULAR | Status: AC
  Filled 2015-08-23: qty 1

## 2015-08-23 MED ORDER — CLINDAMYCIN PHOSPHATE 1 % EX SOLN
Freq: Every day | CUTANEOUS | Status: DC
  Administered 2015-08-24 – 2015-08-26 (×2): via TOPICAL
  Filled 2015-08-23: qty 30

## 2015-08-23 MED ORDER — AMLODIPINE BESYLATE 10 MG PO TABS
10.0000 mg | ORAL_TABLET | Freq: Every day | ORAL | Status: DC
  Administered 2015-08-23 – 2015-08-25 (×3): 10 mg via ORAL
  Filled 2015-08-23 (×3): qty 1

## 2015-08-23 MED ORDER — MAGNESIUM HYDROXIDE 400 MG/5ML PO SUSP
30.0000 mL | Freq: Every day | ORAL | Status: DC | PRN
  Administered 2015-08-24: 30 mL via ORAL
  Filled 2015-08-23: qty 30

## 2015-08-23 MED ORDER — TRANEXAMIC ACID 1000 MG/10ML IV SOLN
1000.0000 mg | Freq: Once | INTRAVENOUS | Status: AC
  Administered 2015-08-23: 1000 mg via INTRAVENOUS
  Filled 2015-08-23: qty 10

## 2015-08-23 MED ORDER — LORAZEPAM 1 MG PO TABS: 1.0000 mg | ORAL_TABLET | Freq: Three times a day (TID) | ORAL | Status: DC | PRN

## 2015-08-23 MED ORDER — LAMOTRIGINE 100 MG PO TABS
300.0000 mg | ORAL_TABLET | Freq: Two times a day (BID) | ORAL | Status: DC
  Administered 2015-08-23 – 2015-08-26 (×6): 300 mg via ORAL
  Filled 2015-08-23 (×2): qty 3
  Filled 2015-08-23: qty 2
  Filled 2015-08-23 (×4): qty 3

## 2015-08-23 MED ORDER — ROCURONIUM BROMIDE 100 MG/10ML IV SOLN
INTRAVENOUS | Status: DC | PRN
  Administered 2015-08-23 (×2): 25 mg via INTRAVENOUS
  Administered 2015-08-23: 20 mg via INTRAVENOUS
  Administered 2015-08-23: 30 mg via INTRAVENOUS

## 2015-08-23 MED ORDER — CLINDAMYCIN PHOSPHATE 600 MG/50ML IV SOLN
600.0000 mg | Freq: Four times a day (QID) | INTRAVENOUS | Status: AC
  Administered 2015-08-23 – 2015-08-24 (×4): 600 mg via INTRAVENOUS
  Filled 2015-08-23 (×4): qty 50

## 2015-08-23 MED ORDER — METOCLOPRAMIDE HCL 10 MG PO TABS
10.0000 mg | ORAL_TABLET | Freq: Three times a day (TID) | ORAL | Status: AC
  Administered 2015-08-23 – 2015-08-25 (×7): 10 mg via ORAL
  Filled 2015-08-23 (×8): qty 1

## 2015-08-23 MED ORDER — ALUM & MAG HYDROXIDE-SIMETH 200-200-20 MG/5ML PO SUSP: 30.0000 mL | ORAL | Status: DC | PRN

## 2015-08-23 MED ORDER — FENTANYL CITRATE (PF) 100 MCG/2ML IJ SOLN
INTRAMUSCULAR | Status: DC | PRN
  Administered 2015-08-23: 50 ug via INTRAVENOUS
  Administered 2015-08-23: 100 ug via INTRAVENOUS
  Administered 2015-08-23: 150 ug via INTRAVENOUS
  Administered 2015-08-23: 50 ug via INTRAVENOUS
  Administered 2015-08-23: 100 ug via INTRAVENOUS
  Administered 2015-08-23: 50 ug via INTRAVENOUS

## 2015-08-23 MED ORDER — ACETAMINOPHEN 10 MG/ML IV SOLN
INTRAVENOUS | Status: DC | PRN
  Administered 2015-08-23: 1000 mg via INTRAVENOUS

## 2015-08-23 MED ORDER — NEOMYCIN-POLYMYXIN B GU 40-200000 IR SOLN
Status: DC | PRN
  Administered 2015-08-23: 14 mL

## 2015-08-23 MED ORDER — TRANEXAMIC ACID 1000 MG/10ML IV SOLN
1000.0000 mg | INTRAVENOUS | Status: AC
  Administered 2015-08-23: 1000 mg via INTRAVENOUS
  Filled 2015-08-23: qty 10

## 2015-08-23 MED ORDER — ONDANSETRON HCL 4 MG/2ML IJ SOLN: 4.0000 mg | Freq: Once | INTRAMUSCULAR | Status: DC | PRN

## 2015-08-23 MED ORDER — MENTHOL 3 MG MT LOZG
1.0000 | LOZENGE | OROMUCOSAL | Status: DC | PRN
  Filled 2015-08-23: qty 9

## 2015-08-23 MED ORDER — DEXAMETHASONE SODIUM PHOSPHATE 4 MG/ML IJ SOLN
INTRAMUSCULAR | Status: DC | PRN
  Administered 2015-08-23: 5 mg via INTRAVENOUS

## 2015-08-23 MED ORDER — ONDANSETRON HCL 4 MG PO TABS: 4.0000 mg | ORAL_TABLET | Freq: Four times a day (QID) | ORAL | Status: DC | PRN

## 2015-08-23 MED ORDER — OXYCODONE HCL 5 MG PO TABS
5.0000 mg | ORAL_TABLET | ORAL | Status: DC | PRN
  Administered 2015-08-23: 5 mg via ORAL
  Administered 2015-08-23: 10 mg via ORAL
  Administered 2015-08-24: 5 mg via ORAL
  Administered 2015-08-24 (×3): 10 mg via ORAL
  Administered 2015-08-25 – 2015-08-26 (×3): 5 mg via ORAL
  Filled 2015-08-23 (×5): qty 1
  Filled 2015-08-23: qty 2
  Filled 2015-08-23: qty 1
  Filled 2015-08-23 (×3): qty 2

## 2015-08-23 MED ORDER — CALCIUM CARBONATE-VITAMIN D 500-200 MG-UNIT PO TABS
1.0000 | ORAL_TABLET | Freq: Every day | ORAL | Status: DC
  Administered 2015-08-24 – 2015-08-26 (×3): 1 via ORAL
  Filled 2015-08-23 (×3): qty 1

## 2015-08-23 MED ORDER — ACETAMINOPHEN 10 MG/ML IV SOLN
1000.0000 mg | Freq: Four times a day (QID) | INTRAVENOUS | Status: AC
  Administered 2015-08-23 – 2015-08-24 (×4): 1000 mg via INTRAVENOUS
  Filled 2015-08-23 (×5): qty 100

## 2015-08-23 MED ORDER — SODIUM CHLORIDE 0.9 % IV SOLN
INTRAVENOUS | Status: DC
  Administered 2015-08-23 – 2015-08-24 (×3): via INTRAVENOUS

## 2015-08-23 MED ORDER — CLOTRIMAZOLE 1 % EX CREA
1.0000 "application " | TOPICAL_CREAM | Freq: Two times a day (BID) | CUTANEOUS | Status: DC | PRN
  Filled 2015-08-23: qty 15

## 2015-08-23 MED ORDER — FLEET ENEMA 7-19 GM/118ML RE ENEM
1.0000 | ENEMA | Freq: Once | RECTAL | Status: AC | PRN
  Administered 2015-08-25: 1 via RECTAL

## 2015-08-23 MED ORDER — NEOMYCIN-POLYMYXIN B GU 40-200000 IR SOLN
Status: AC
  Filled 2015-08-23: qty 2

## 2015-08-23 MED ORDER — KETAMINE HCL 50 MG/ML IJ SOLN
INTRAMUSCULAR | Status: DC | PRN
  Administered 2015-08-23: 100 mg via INTRAVENOUS

## 2015-08-23 MED ORDER — CLINDAMYCIN PHOSPHATE 900 MG/50ML IV SOLN
INTRAVENOUS | Status: AC
  Administered 2015-08-23: 900 mg via INTRAVENOUS
  Filled 2015-08-23: qty 50

## 2015-08-23 MED ORDER — BISACODYL 10 MG RE SUPP
10.0000 mg | Freq: Every day | RECTAL | Status: DC | PRN
  Administered 2015-08-25: 10 mg via RECTAL
  Filled 2015-08-23: qty 1

## 2015-08-23 MED ORDER — ACETAMINOPHEN 10 MG/ML IV SOLN
INTRAVENOUS | Status: AC
  Filled 2015-08-23: qty 100

## 2015-08-23 MED ORDER — SENNOSIDES-DOCUSATE SODIUM 8.6-50 MG PO TABS
1.0000 | ORAL_TABLET | Freq: Two times a day (BID) | ORAL | Status: DC
Start: 2015-08-23 — End: 2015-08-26
  Administered 2015-08-23 – 2015-08-26 (×6): 1 via ORAL
  Filled 2015-08-23 (×6): qty 1

## 2015-08-23 MED ORDER — FENTANYL CITRATE (PF) 100 MCG/2ML IJ SOLN
INTRAMUSCULAR | Status: AC
  Filled 2015-08-23: qty 2

## 2015-08-23 MED ORDER — MORPHINE SULFATE (PF) 2 MG/ML IV SOLN: 2.0000 mg | INTRAVENOUS | Status: DC | PRN

## 2015-08-23 MED ORDER — DULOXETINE HCL 60 MG PO CPEP
60.0000 mg | ORAL_CAPSULE | Freq: Every day | ORAL | Status: DC
  Administered 2015-08-24 – 2015-08-26 (×3): 60 mg via ORAL
  Filled 2015-08-23 (×4): qty 1

## 2015-08-23 MED ORDER — FENTANYL CITRATE (PF) 100 MCG/2ML IJ SOLN
25.0000 ug | INTRAMUSCULAR | Status: DC | PRN
  Administered 2015-08-23 (×5): 25 ug via INTRAVENOUS

## 2015-08-23 MED ORDER — LIDOCAINE HCL (CARDIAC) 20 MG/ML IV SOLN
INTRAVENOUS | Status: DC | PRN
  Administered 2015-08-23: 60 mg via INTRAVENOUS

## 2015-08-23 MED ORDER — NEOSTIGMINE METHYLSULFATE 10 MG/10ML IV SOLN
INTRAVENOUS | Status: DC | PRN
  Administered 2015-08-23: 3 mg via INTRAVENOUS

## 2015-08-23 MED ORDER — ONDANSETRON HCL 4 MG/2ML IJ SOLN
4.0000 mg | Freq: Four times a day (QID) | INTRAMUSCULAR | Status: DC | PRN
  Administered 2015-08-24: 4 mg via INTRAVENOUS
  Filled 2015-08-23: qty 2

## 2015-08-23 MED ORDER — BENZOYL PEROXIDE 5 % EX LIQD: Freq: Every day | CUTANEOUS | Status: DC

## 2015-08-23 MED ORDER — ACETAMINOPHEN 650 MG RE SUPP: 650.0000 mg | Freq: Four times a day (QID) | RECTAL | Status: DC | PRN

## 2015-08-23 MED ORDER — ENOXAPARIN SODIUM 30 MG/0.3ML ~~LOC~~ SOLN
30.0000 mg | Freq: Two times a day (BID) | SUBCUTANEOUS | Status: DC
  Administered 2015-08-24 – 2015-08-26 (×5): 30 mg via SUBCUTANEOUS
  Filled 2015-08-23 (×5): qty 0.3

## 2015-08-23 MED ORDER — ACETAMINOPHEN 325 MG PO TABS
650.0000 mg | ORAL_TABLET | Freq: Four times a day (QID) | ORAL | Status: DC | PRN
  Administered 2015-08-24: 650 mg via ORAL
  Filled 2015-08-23: qty 2

## 2015-08-23 MED ORDER — PHENYLEPHRINE HCL 10 MG/ML IJ SOLN
INTRAMUSCULAR | Status: DC | PRN
  Administered 2015-08-23 (×9): 100 ug via INTRAVENOUS

## 2015-08-23 MED ORDER — DIPHENHYDRAMINE HCL 12.5 MG/5ML PO ELIX: 12.5000 mg | ORAL_SOLUTION | ORAL | Status: DC | PRN

## 2015-08-23 SURGICAL SUPPLY — 51 items
BUR SURG ROUTER 3X19 TPS (BURR) ×3 IMPLANT
CANISTER SUCT 1200ML W/VALVE (MISCELLANEOUS) ×3 IMPLANT
CANISTER SUCT 3000ML (MISCELLANEOUS) ×6 IMPLANT
CAPT HIP TOTAL 2 ×3 IMPLANT
CATH TRAY METER 16FR LF (MISCELLANEOUS) ×3 IMPLANT
DRAPE INCISE IOBAN 66X60 STRL (DRAPES) ×3 IMPLANT
DRAPE SHEET LG 3/4 BI-LAMINATE (DRAPES) ×6 IMPLANT
DRAPE TABLE BACK 80X90 (DRAPES) ×3 IMPLANT
DRSG DERMACEA 8X12 NADH (GAUZE/BANDAGES/DRESSINGS) ×3 IMPLANT
DRSG OPSITE POSTOP 4X14 (GAUZE/BANDAGES/DRESSINGS) ×3 IMPLANT
DRSG TEGADERM 6X8 (GAUZE/BANDAGES/DRESSINGS) ×3 IMPLANT
DURAPREP 26ML APPLICATOR (WOUND CARE) ×3 IMPLANT
ELECT BLADE 6.5 EXT (BLADE) ×3 IMPLANT
ELECT CAUTERY BLADE 6.4 (BLADE) ×3 IMPLANT
GAUZE SPONGE 4X4 12PLY STRL (GAUZE/BANDAGES/DRESSINGS) ×3 IMPLANT
GLOVE BIOGEL M STRL SZ7.5 (GLOVE) ×6 IMPLANT
GLOVE INDICATOR 8.0 STRL GRN (GLOVE) ×3 IMPLANT
GLOVE SURG 9.0 ORTHO LTXF (GLOVE) ×3 IMPLANT
GLOVE SURG ORTHO 9.0 STRL STRW (GLOVE) ×3 IMPLANT
GOWN STRL REUS W/ TWL LRG LVL3 (GOWN DISPOSABLE) ×4 IMPLANT
GOWN STRL REUS W/TWL 2XL LVL3 (GOWN DISPOSABLE) ×3 IMPLANT
GOWN STRL REUS W/TWL LRG LVL3 (GOWN DISPOSABLE) ×2
HANDPIECE INTERPULSE COAX TIP (DISPOSABLE) ×1
HANDPIECE VERSAJET DEBRIDEMENT (MISCELLANEOUS) IMPLANT
HEMOVAC 400CC 10FR (MISCELLANEOUS) ×3 IMPLANT
HOOD PEEL AWAY FLYTE STAYCOOL (MISCELLANEOUS) ×6 IMPLANT
IRRIGATION STRYKERFLOW (MISCELLANEOUS) ×2 IMPLANT
IRRIGATOR STRYKERFLOW (MISCELLANEOUS) ×3
IV NS 100ML SINGLE PACK (IV SOLUTION) ×3 IMPLANT
NDL SAFETY 18GX1.5 (NEEDLE) ×3 IMPLANT
NEEDLE FILTER BLUNT 18X 1/2SAF (NEEDLE) ×1
NEEDLE FILTER BLUNT 18X1 1/2 (NEEDLE) ×2 IMPLANT
NS IRRIG 1000ML POUR BTL (IV SOLUTION) ×3 IMPLANT
PACK HIP PROSTHESIS (MISCELLANEOUS) ×3 IMPLANT
SET HNDPC FAN SPRY TIP SCT (DISPOSABLE) ×2 IMPLANT
SOL .9 NS 3000ML IRR  AL (IV SOLUTION) ×1
SOL .9 NS 3000ML IRR UROMATIC (IV SOLUTION) ×2 IMPLANT
STAPLER SKIN PROX 35W (STAPLE) ×3 IMPLANT
STRAP SAFETY BODY (MISCELLANEOUS) ×3 IMPLANT
SUCTION FRAZIER HANDLE 10FR (MISCELLANEOUS) ×1
SUCTION TUBE FRAZIER 10FR DISP (MISCELLANEOUS) ×2 IMPLANT
SUT ETHIBOND #5 BRAIDED 30INL (SUTURE) ×3 IMPLANT
SUT VIC AB 0 CT1 36 (SUTURE) ×3 IMPLANT
SUT VIC AB 1 CT1 36 (SUTURE) ×6 IMPLANT
SUT VIC AB 2-0 CT1 27 (SUTURE) ×1
SUT VIC AB 2-0 CT1 TAPERPNT 27 (SUTURE) ×2 IMPLANT
SYR 20CC LL (SYRINGE) ×3 IMPLANT
TAPE ADH 3 LX (MISCELLANEOUS) ×3 IMPLANT
TAPE TRANSPORE STRL 2 31045 (GAUZE/BANDAGES/DRESSINGS) ×3 IMPLANT
TIP COAXIAL FEMORAL CANAL (MISCELLANEOUS) ×3 IMPLANT
TOWEL OR 17X26 4PK STRL BLUE (TOWEL DISPOSABLE) ×3 IMPLANT

## 2015-08-23 NOTE — NC FL2 (Signed)
[place] LEVEL OF CARE SCREENING TOOL     IDENTIFICATION  Patient Name: Helen Patton Birthdate: 1960-06-05 Sex: female Admission Date (Current Location): 08/23/2015  [facility] [place] Number:  Selena Lesser  (MN:[id number] [facility]) Facility and Address:  [facility], [address] [place], [place] [address]      Provider Number: [id number]  Attending Physician Name and Address:  Dereck Leep, MD  Relative Name and Phone Number:       Current Level of Care: Hospital Recommended Level of Care: [place] Prior Approval Number:    Date Approved/Denied:   PASRR Number:  [id number])  Discharge Plan: SNF    Current Diagnoses: Patient Active Problem List   Diagnosis Date Noted  . S/P total hip arthroplasty 08/23/2015  . Acute respiratory failure with hypoxia ([facility]) 05/19/2015  . 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 Closed fracture of neck of right femur ([place]) 03/07/2015  . Anxiety 12/14/2014  . Arthralgia of hip 12/14/2014  . Athlete's foot 12/14/2014  . Cerebral palsy ([place]) 12/14/2014  . Cognitive decline 12/14/2014  . Clinical depression 12/14/2014  . Acid reflux 12/14/2014  . HLD (hyperlipidemia) 12/14/2014  . BP (high blood pressure) 12/14/2014  . Methicillin resistant Staphylococcus aureus infection 12/14/2014  . Adiposity 12/14/2014  . Breast cancer ([place]) 11/21/2013  . Mental retardation 11/16/2013  . Seizure ([place]) 09/30/2013    Orientation RESPIRATION BLADDER Height & Weight     Self  O2 (2 Liters Oxygen ) Continent Weight: 144 lb 15.9 oz (65.77 kg) Height:  4' 10.66" (149 cm)  BEHAVIORAL SYMPTOMS/MOOD NEUROLOGICAL BOWEL NUTRITION STATUS   (none ) Convulsions/Seizures (History of Seizures ) Continent Diet (Diet: Clear Liquid )  AMBULATORY STATUS COMMUNICATION OF NEEDS Skin   Extensive Assist Verbally Surgical wounds (Incision: Right Hip. )                       Personal Care  Assistance Level of Assistance  Bathing, Feeding, Dressing Bathing Assistance: Limited assistance Feeding assistance: Independent Dressing Assistance: Limited assistance     Functional Limitations Info  Sight, Hearing, Speech Sight Info: Adequate Hearing Info: Adequate Speech Info: Adequate    SPECIAL CARE FACTORS FREQUENCY  PT (By licensed PT), OT (By licensed OT)     PT Frequency:  (5) OT Frequency:  (5)            Contractures      Additional Factors Info  Code Status, Allergies Code Status Info:  (Full Code. ) Allergies Info:  (Sulfa Antibiotics, Keflex )           Current Medications (08/23/2015):  This is the current hospital active medication list Current Facility-Administered Medications  Medication Dose Route Frequency Provider Last Rate Last Dose  . 0.9 %  sodium chloride infusion   Intravenous Continuous Dereck Leep, MD 100 mL/hr at 08/23/15 1410    . acetaminophen (OFIRMEV) IV 1,000 mg  1,000 mg Intravenous Q6H Dereck Leep, MD   1,000 mg at 08/23/15 1627  . acetaminophen (TYLENOL) tablet 650 mg  650 mg Oral Q6H PRN Dereck Leep, MD       Or  . acetaminophen (TYLENOL) suppository 650 mg  650 mg Rectal Q6H PRN Dereck Leep, MD      . alum & mag hydroxide-simeth (MAALOX/MYLANTA) 200-200-20 MG/5ML suspension 30 mL  30 mL Oral Q4H PRN Dereck Leep, MD      . amLODipine (Zalma)  tablet 10 mg  10 mg Oral QHS Dereck Leep, MD      . bisacodyl (DULCOLAX) suppository 10 mg  10 mg Rectal Daily PRN Dereck Leep, MD      . Derrill Memo ON 08/24/2015] calcium-vitamin D (OSCAL WITH D) 500-200 MG-UNIT per tablet 1 tablet  1 tablet Oral Q breakfast Dereck Leep, MD      . clindamycin (CLEOCIN T) 1 % external solution   Topical Daily Dereck Leep, MD      . clindamycin (CLEOCIN) IVPB 600 mg  600 mg Intravenous Q6H Dereck Leep, MD   600 mg at 08/23/15 1626  . clotrimazole (LOTRIMIN) 1 % cream 1 application  1 application Topical BID PRN Dereck Leep, MD       . diphenhydrAMINE (BENADRYL) 12.5 MG/5ML elixir 12.5-25 mg  12.5-25 mg Oral Q4H PRN Dereck Leep, MD      . DULoxetine (CYMBALTA) DR capsule 60 mg  60 mg Oral Daily Dereck Leep, MD   60 mg at 08/23/15 1641  . [START ON 08/24/2015] enoxaparin (LOVENOX) injection 30 mg  30 mg Subcutaneous Q12H Dereck Leep, MD      . fentaNYL (SUBLIMAZE) 100 MCG/2ML injection           . fentaNYL (SUBLIMAZE) 100 MCG/2ML injection           . ferrous sulfate tablet 325 mg  325 mg Oral BID WC Dereck Leep, MD   325 mg at 08/23/15 1641  . lamoTRIgine (LAMICTAL) tablet 300 mg  300 mg Oral BID Dereck Leep, MD      . letrozole [facility]) tablet 2.5 mg  2.5 mg Oral Daily Dereck Leep, MD   2.5 mg at 08/23/15 1640  . loratadine (CLARITIN) tablet 10 mg  10 mg Oral Daily Dereck Leep, MD   10 mg at 08/23/15 1639  . LORazepam (ATIVAN) tablet 1 mg  1 mg Oral Q8H PRN Dereck Leep, MD      . Derrill Memo ON 08/24/2015] losartan (COZAAR) tablet 100 mg  100 mg Oral Daily Dereck Leep, MD      . magnesium hydroxide (MILK OF MAGNESIA) suspension 30 mL  30 mL Oral Daily PRN Dereck Leep, MD      . menthol-cetylpyridinium (CEPACOL) lozenge 3 mg  1 lozenge Oral PRN Dereck Leep, MD       Or  . phenol (CHLORASEPTIC) mouth spray 1 spray  1 spray Mouth/Throat PRN Dereck Leep, MD      . metoCLOPramide (REGLAN) tablet 10 mg  10 mg Oral TID AC & HS Dereck Leep, MD   10 mg at 08/23/15 1625  . morphine 2 MG/ML injection 2 mg  2 mg Intravenous Q2H PRN Dereck Leep, MD      . ondansetron (ZOFRAN) tablet 4 mg  4 mg Oral Q6H PRN Dereck Leep, MD       Or  . ondansetron (ZOFRAN) injection 4 mg  4 mg Intravenous Q6H PRN Dereck Leep, MD      . oxyCODONE (Oxy IR/ROXICODONE) immediate release tablet 5-10 mg  5-10 mg Oral Q4H PRN Dereck Leep, MD   5 mg at 08/23/15 1625  . senna-docusate (Senokot-S) tablet 1 tablet  1 tablet Oral BID Dereck Leep, MD   1 tablet at 08/23/15 1639  . sodium phosphate (FLEET) 7-19 GM/118ML  enema 1 enema  1 enema Rectal Once PRN Jeneen Rinks  P Hooten, MD      . terbinafine (LAMISIL) 1 % cream   Topical BID PRN Dereck Leep, MD         Discharge Medications: Please see discharge summary for a list of discharge medications.  Relevant Imaging Results:  Relevant Lab Results:   Additional Information  (SSN: [id number])  Loralyn Freshwater, LCSW

## 2015-08-23 NOTE — Anesthesia Procedure Notes (Signed)
Procedure Name: Intubation Date/Time: 08/23/2015 7:49 AM Performed by: Rosaria Ferries, Shonice Wrisley Pre-anesthesia Checklist: Patient identified, Emergency Drugs available, Suction available and Patient being monitored Patient Re-evaluated:Patient Re-evaluated prior to inductionOxygen Delivery Method: Circle system utilized Preoxygenation: Pre-oxygenation with 100% oxygen Intubation Type: IV induction Laryngoscope Size: Mac and 3 Grade View: Grade I Tube size: 6.0 mm Number of attempts: 1 Placement Confirmation: ETT inserted through vocal cords under direct vision,  positive ETCO2 and breath sounds checked- equal and bilateral Secured at: 19 cm Tube secured with: Tape Dental Injury: Teeth and Oropharynx as per pre-operative assessment

## 2015-08-23 NOTE — Brief Op Note (Signed)
08/23/2015  12:56 PM  PATIENT:  Helen Patton  55 y.o. female  PRE-OPERATIVE DIAGNOSIS:  AVASCULAR NECROSIS OF RIGHT FEMORAL HEAD STATUS post open reduction and internal fixation of a femoral neck fracture  POST-OPERATIVE DIAGNOSIS:  Same  PROCEDURE:   Hardware removal from the right hip Conversion of previous surgery to a right total hip arthroplasty Autogenous bone grafting of the right acetabulum  SURGEON:  Surgeon(s) and Role:    * Dereck Leep, MD - Primary  PHYSICIAN ASSISTANT:   ASSISTANTS: Vance Peper, PA   ANESTHESIA:   general  EBL: 500 ml  Fluids replaced: 2200 ml of crystalloid  BLOOD ADMINISTERED:none  DRAINS: 2 medium Hemovac drains   LOCAL MEDICATIONS USED:  NONE  SPECIMEN:  Source of Specimen:  Remnant of right femoral head  DISPOSITION OF SPECIMEN:  PATHOLOGY  COUNTS:  YES  TOURNIQUET:  * No tourniquets in log *  DICTATION: .Dragon Dictation  PLAN OF CARE: Admit to inpatient   PATIENT DISPOSITION:  PACU - hemodynamically stable.   Delay start of Pharmacological VTE agent (>24hrs) due to surgical blood loss or risk of bleeding: yes

## 2015-08-23 NOTE — Anesthesia Preprocedure Evaluation (Signed)
Anesthesia Evaluation  Patient identified by MRN, date of birth, ID band Patient confused  General Assessment Comment:Mental retardation, cognitive decline  Airway Mallampati: III       Dental  (+) Edentulous Upper, Missing   Pulmonary neg pulmonary ROS,    breath sounds clear to auscultation       Cardiovascular Exercise Tolerance: Poor hypertension, Pt. on medications  Rhythm:Regular     Neuro/Psych Seizures -,  Anxiety    GI/Hepatic Neg liver ROS, hiatal hernia, GERD  ,  Endo/Other  negative endocrine ROS  Renal/GU negative Renal ROS     Musculoskeletal   Abdominal Normal abdominal exam  (+)   Peds  Hematology negative hematology ROS (+)   Anesthesia Other Findings   Reproductive/Obstetrics                             Anesthesia Physical Anesthesia Plan  ASA: III  Anesthesia Plan: General   Post-op Pain Management:    Induction: Intravenous  Airway Management Planned: Oral ETT  Additional Equipment:   Intra-op Plan:   Post-operative Plan: Extubation in OR  Informed Consent: I have reviewed the patients History and Physical, chart, labs and discussed the procedure including the risks, benefits and alternatives for the proposed anesthesia with the patient or authorized representative who has indicated his/her understanding and acceptance.     Plan Discussed with: CRNA  Anesthesia Plan Comments:         Anesthesia Quick Evaluation

## 2015-08-23 NOTE — Transfer of Care (Signed)
Immediate Anesthesia Transfer of Care Note  Patient: Helen Patton  Procedure(s) Performed: Procedure(s): TOTAL HIP REVISION (Right) HARDWARE REMOVAL CONVERSION TO TOTAL HIP  Patient Location: PACU  Anesthesia Type:General  Level of Consciousness: sedated  Airway & Oxygen Therapy: Patient Spontanous Breathing and Patient connected to nasal cannula oxygen  Post-op Assessment: Report given to RN and Post -op Vital signs reviewed and stable  Post vital signs: Reviewed and stable  Last Vitals:  Filed Vitals:   08/23/15 0606  BP: 133/64  Pulse: 92  Temp: 36.5 C  Resp: 18    Last Pain:  Filed Vitals:   08/23/15 0609  PainSc: 0-No pain         Complications: No apparent anesthesia complications

## 2015-08-23 NOTE — Op Note (Signed)
OPERATIVE NOTE  DATE OF SURGERY:  08/23/2015  PATIENT NAME:  Helen Patton   DOB: March 14, 1960  MRN: [id number]  PRE-OPERATIVE DIAGNOSIS: Avascular necrosis of the right femoral head status post open reduction and internal fixation of a right femoral neck fracture  POST-OPERATIVE DIAGNOSIS:  Same  PROCEDURE: Hardware removal from the right hip Conversion of previous hip surgery to a right total hip arthroplasty Autogenous bone grafting of the right acetabulum  SURGEON:  Marciano Sequin. M.D.  ASSISTANT:  Vance Peper, PA (present and scrubbed throughout the case, critical for assistance with exposure, retraction, instrumentation, and closure)  ANESTHESIA: general  ESTIMATED BLOOD LOSS: 500 mL  FLUIDS REPLACED: 2200 mL of crystalloid  DRAINS: 2 medium drains to a Hemovac reservoir  IMPLANTS UTILIZED: DePuy size 1 standard Summit femoral stem with Por[place]oat, 48 mm OD Pinnacle Gription Sector acetabular component, neutral Pinnacle Altrx polyethylene insert, 2 - 6.5 mm x 20 mm Pinnacle cancellous bone screws, and a 32 mm CoCr +1 mm hip ball  INDICATIONS FOR SURGERY: Helen Patton is a 55 y.o. year old female with a long history of progressive hip and groin  pain. She underwent open reduction and internal fixation of a right femoral neck fracture by Dr. Margaretmary Eddy in 2012. X-rays demonstrated severe avascular changes to the right femoral head with perforation of the screws and some superior destruction of the acetabulum.  After discussion of the risks and benefits of surgical intervention with the patient's mother and care providers, they expressed understanding of the risks benefits and agree with plans for hardware removal and total hip arthroplasty.   The risks, benefits, and alternatives were discussed at length including but not limited to the risks of infection, bleeding, nerve injury, stiffness, blood clots, the need for revision surgery, limb length inequality, dislocation,  cardiopulmonary complications, among others, and they were willing to proceed.  PROCEDURE IN DETAIL: The patient was brought into the operating room and, after adequate general anesthesia was achieved, the patient was placed in a left lateral decubitus position. Axillary roll was placed and all bony prominences were well-padded. The patient's right hip was cleaned and prepped with alcohol and DuraPrep and draped in the usual sterile fashion. A "timeout" was performed as per usual protocol. A lateral curvilinear incision was made gently curving towards the posterior superior iliac spine. The IT band was incised in line with the skin incision and the fibers of the gluteus maximus were split in line. The previously placed hardware was palpated and an incision was made in the fascia of the vastus lateralis and the muscle was reflected anteriorly revealing the dynamic compression plate and two 7.3 mm screw heads. The 7.3 mm screws were removed without difficulty. The 3 cortical screws stabilizing the plate were removed and the plate was removed. Finally, the compression screw was engaged with the extraction device and was removed without difficulty. The piriformis tendon was identified, skeletonized, and incised at its insertion to the proximal femur and reflected posteriorly. A T type posterior capsulotomy was performed. The hip was felt to be tight, so it was elected to incise the gluteal sling so as to better mobilize proximal femur. The femoral head was then dislocated posteriorly. Inspection of the femoral head demonstrated gross wrist destruction and collapse consistent with avascular necrosis. The areas of screw perforation were also identified. The femoral neck cut was performed using an oscillating saw. The anterior capsule was elevated off of the femoral neck using a periosteal elevator. Attention  was then directed to the acetabulum. The remnant of the labrum was excised using electrocautery. There was an  area of erosion to the superior aspect of the acetabulum, although there was still greater than 70% of the acetabulum that would allow good contact with the implant. Inspection of the acetabulum also demonstrated significant degenerative changes. The acetabulum was reamed in sequential fashion up to a 47 mm diameter. Good punctate bleeding bone was encountered. Cancellous bone retrieved from the remnant of the acetabular head as well as from the femoral shaft was then placed in the defect and impacted in place by using the previous reamer in reverse mode. A 48 mm Pinnacle Gription Sector acetabular component was positioned and impacted into place. Good scratch fit was appreciated. Additional fixation was provided using 26.5 mm x 20 mm cancellous bone screws through the dome holes area. A neutral polyethylene trial was inserted.  Attention was then directed to the proximal femur. A hole for reaming of the proximal femoral canal was created using a high-speed burr. The femoral canal was reamed in sequential fashion up to a 7 mm diameter. Serial broaches were inserted up to a size 1 Summit femoral broach. Calcar region was planed and a trial reduction was performed using a 32 mm hip ball with a +1 mm neck length. There was difficulty reducing the hip and so the trial hip ball was removed and the broach was countersunk and calcar region planed again. Trial reduction was again performed. Good equalization of limb lengths and hip offset was appreciated and excellent stability was noted both anteriorly and posteriorly. Trial components were removed. The acetabular shell was irrigated with copious amounts of normal saline with antibiotic solution and suctioned dry. A neutral Pinnacle Altrx polyethylene insert was positioned and impacted into place. Next, a size 1 standard Summit femoral stem was positioned and impacted into place. Excellent scratch fit was appreciated. A trial reduction was again performed with a 32 mm  hip ball with a +1 mm neck length. Again, good equalization of limb lengths was appreciated and excellent stability appreciated both anteriorly and posteriorly. The hip was then dislocated and the trial hip ball was removed. The Morse taper was cleaned and dried. A 32 mm cobalt chrome hip ball with a +1 mm neck length was placed on the trunnion and impacted into place. The hip was then reduced and placed through range of motion. Excellent stability was appreciated both anteriorly and posteriorly.  The wound was irrigated with copious amounts of normal saline with antibiotic solution and suctioned dry. Good hemostasis was appreciated. The posterior capsulotomy was repaired using #5 Ethibond. Piriformis tendon was reapproximated to the undersurface of the gluteus medius tendon using #5 Ethibond. Two medium drains were placed in the wound bed and brought out through separate stab incisions to be attached to a Hemovac reservoir. The IT band was reapproximated using interrupted sutures of #1 Vicryl. Subcutaneous tissue was approximated using first #0 Vicryl followed by #2-0 Vicryl. The skin was closed with skin staples.  The patient tolerated the procedure well and was transported to the recovery room in stable condition.   Marciano Sequin., M.D.

## 2015-08-23 NOTE — OR Nursing (Signed)
Having difficulty threading IV's up. After two attempts T+S drawn and Dr. [place] Service agreed to start IV in the OR.

## 2015-08-23 NOTE — OR Nursing (Signed)
Type and Screen results pending. Dr. Boston Service and [name] notified and they agreed to send patient back to OR and begin preparation for surgery.

## 2015-08-23 NOTE — Anesthesia Postprocedure Evaluation (Signed)
Anesthesia Post Note  Patient: Helen Patton  Procedure(s) Performed: Procedure(s) (LRB): TOTAL HIP REVISION (Right) HARDWARE REMOVAL CONVERSION TO TOTAL HIP  Patient location during evaluation: PACU Anesthesia Type: General Level of consciousness: awake Pain management: pain level controlled Vital Signs Assessment: post-procedure vital signs reviewed and stable Respiratory status: spontaneous breathing Cardiovascular status: stable Anesthetic complications: no    Last Vitals:  Filed Vitals:   08/23/15 0606 08/23/15 1314  BP: 133/64 119/60  Pulse: 92 89  Temp: 36.5 C 36.2 C  Resp: 18 14    Last Pain:  Filed Vitals:   08/23/15 1315  PainSc: 0-No pain                 VAN STAVEREN,Loveta Dellis

## 2015-08-23 NOTE — H&P (Signed)
The patient has been re-examined, and the chart reviewed, and there have been no interval changes to the documented history and physical.    The risks, benefits, and alternatives have been discussed at length. The patient expressed understanding of the risks benefits and agreed with plans for surgical intervention.  James P. Hooten, Jr. M.D.    

## 2015-08-24 LAB — CBC
HEMATOCRIT: 30.3 % — AB (ref 35.0–47.0)
Hemoglobin: 10.1 g/dL — ABNORMAL LOW (ref 12.0–16.0)
MCH: 30.7 pg (ref 26.0–34.0)
MCHC: 33.5 g/dL (ref 32.0–36.0)
MCV: 91.7 fL (ref 80.0–100.0)
PLATELETS: 304 10*3/uL (ref 150–440)
RBC: 3.31 MIL/uL — AB (ref 3.80–5.20)
RDW: 14.4 % (ref 11.5–14.5)
WBC: 13.1 10*3/uL — AB (ref 3.6–11.0)

## 2015-08-24 LAB — BASIC METABOLIC PANEL
ANION GAP: 4 — AB (ref 5–15)
BUN: 9 mg/dL (ref 6–20)
CALCIUM: 8.2 mg/dL — AB (ref 8.9–10.3)
CO2: 31 mmol/L (ref 22–32)
Chloride: 106 mmol/L (ref 101–111)
Creatinine, Ser: 0.52 mg/dL (ref 0.44–1.00)
GLUCOSE: 115 mg/dL — AB (ref 65–99)
POTASSIUM: 4.4 mmol/L (ref 3.5–5.1)
Sodium: 141 mmol/L (ref 135–145)

## 2015-08-24 MED ORDER — OXYCODONE HCL 5 MG PO TABS: 5.0000 mg | ORAL_TABLET | ORAL | Status: DC | PRN

## 2015-08-24 MED ORDER — ENOXAPARIN SODIUM 30 MG/0.3ML ~~LOC~~ SOLN: 30.0000 mg | Freq: Two times a day (BID) | SUBCUTANEOUS | Status: DC

## 2015-08-24 NOTE — Discharge Summary (Signed)
Physician Discharge Summary  Patient ID: Helen Patton MRN: [id number] DOB/AGE: Aug 12, 1960 55 y.o.  Admit date: 08/23/2015 Discharge date: 08/26/2015  Admission Diagnoses:  AVASCULAR NECROSIS OF RIGHT FEMORAL HEAD   Discharge Diagnoses: Patient Active Problem List   Diagnosis Date Noted  . S/P total hip arthroplasty 08/23/2015  . Acute respiratory failure with hypoxia ([place]) 05/19/2015  . Avascular necrosis of femoral head ([place]) 03/07/2015  . Closed fracture of neck of right femur ([place]) 03/07/2015  . Anxiety 12/14/2014  . Arthralgia of hip 12/14/2014  . Athlete's foot 12/14/2014  . Cerebral palsy ([place]) 12/14/2014  . Cognitive decline 12/14/2014  . Clinical depression 12/14/2014  . Acid reflux 12/14/2014  . HLD (hyperlipidemia) 12/14/2014  . BP (high blood pressure) 12/14/2014  . Methicillin resistant Staphylococcus aureus infection 12/14/2014  . Adiposity 12/14/2014  . Breast cancer ([place]) 11/21/2013  . Mental retardation 11/16/2013  . Seizure ([place]) 09/30/2013    Past Medical History  Diagnosis Date  . Mental retardation   . GERD (gastroesophageal reflux disease)   . Osteoporosis   . Hypertension   . MRSA (methicillin resistant staph aureus) culture positive 2012  . Hidradenitis 2012    MRSA, on chronic topical suppression  . Cancer [facility]) Oct 2015    invasive mammary carcinoma  . Breast cancer ([place]) 2015    [name]  . Falls   . Seizures ([place])     last seizure February 2017  . History of hiatal hernia   . Difficulty swallowing   . Anxiety   . Depression   . Hyperlipidemia   . Arthritis     osteoarthritis  . Pneumonia     aspiration pneumonia     Transfusion: no transfusion given during this admission   Consultants (if any):  Case management for placement  Discharged Condition: Improved  Hospital Course: Helen Patton is an 55 y.o. female who was admitted 08/23/2015 with a diagnosis of degenerative arthrosis right hip and went to  the operating room on 08/23/2015 and underwent the above named procedures.    Surgeries:Procedure(s): TOTAL HIP REVISION HARDWARE REMOVAL CONVERSION TO TOTAL HIP on 08/23/2015   PPRE-OPERATIVE DIAGNOSIS: Avascular necrosis of the right femoral head status post open reduction and internal fixation of a right femoral neck fracture  POST-OPERATIVE DIAGNOSIS: Same  PROCEDURE: Hardware removal from the right hip Conversion of previous hip surgery to a right total hip arthroplasty Autogenous bone grafting of the right acetabulum  SURGEON: Marciano Sequin. M.D.  ASSISTANT: Vance Peper, PA (present and scrubbed throughout the case, critical for assistance with exposure, retraction, instrumentation, and closure)  ANESTHESIA: general  ESTIMATED BLOOD LOSS: 500 mL  FLUIDS REPLACED: 2200 mL of crystalloid  DRAINS: 2 medium drains to a Hemovac reservoir  IMPLANTS UTILIZED: DePuy size 1 standard [place] femoral stem with [place], 48 mm OD Pinnacle Gription Sector acetabular component, neutral Pinnacle Altrx polyethylene insert, 2 - 6.5 mm x 20 mm Pinnacle cancellous bone screws, and a 32 mm CoCr +1 mm hip ball  INDICATIONS FOR SURGERY: Helen Patton is a 55 y.o. year old female with a long history of progressive hip and groin pain. She underwent open reduction and internal fixation of a right femoral neck fracture by Dr. Margaretmary Eddy in 2012. X-rays demonstrated severe avascular changes to the right femoral head with perforation of the screws and some superior destruction of the acetabulum. After discussion of the risks and benefits of surgical intervention with the patient's mother and care providers,  they expressed understanding of the risks benefits and agree with plans for hardware removal and total hip arthroplasty.   The risks, benefits, and alternatives were discussed at length including but not limited to the risks of infection, bleeding, nerve injury, stiffness, blood clots, the  need for revision surgery, limb length inequality, dislocation, cardiopulmonary complications, among others, and they were willing to proceed.atient tolerated the surgery well. No complications .Patient was taken to PACU where she was stabilized and then transferred to the orthopedic floor.  Patient started on Lovenox 30 q 12 hrs. Foot pumps applied bilaterally at 80 mm hg. Heels elevated off bed with rolled towels. No evidence of DVT. Calves non tender. Negative [place]. Physical therapy started on day #1 for gait training and transfer with OT starting on  day #1 for ADL and assisted devices. Patient has done well with therapy. Ambulated 22 feet upon being discharged.  Patient's IV and foley were discontinued on day one with the hemovac being discontinued on day 2. Dressing changed on day 2.   She was given perioperative antibiotics:  Anti-infectives    Start     Dose/Rate Route Frequency Ordered Stop   08/23/15 1600  clindamycin (CLEOCIN) IVPB 600 mg     600 mg 100 mL/hr over 30 Minutes Intravenous Every 6 hours 08/23/15 1506 08/24/15 1559   08/23/15 0601  clindamycin (CLEOCIN) 900 MG/50ML IVPB    Comments:  Idamae Lusher: cabinet override      08/23/15 0601 08/23/15 0832   08/23/15 0231  clindamycin (CLEOCIN) IVPB 900 mg  Status:  Discontinued     900 mg 100 mL/hr over 30 Minutes Intravenous On call to O.R. 08/23/15 0231 08/23/15 1505    .  She was fitted with AV 1 compression foot pump devices, instructed on heel pumps, early ambulation, and fitted with TED stocking bilaterally  for DVT prophylaxis.  She benefited maximally from the hospital stay and there were no complications.    Recent vital signs:  Filed Vitals:   08/24/15 0000 08/24/15 0432  BP: 111/43 140/67  Pulse: 91 92  Temp: 97.8 F (36.6 C) 97.6 F (36.4 C)  Resp:      Recent laboratory studies:  Lab Results  Component Value Date   HGB 10.1* 08/24/2015   HGB 12.3 08/09/2015   HGB 11.6* 05/23/2015   Lab  Results  Component Value Date   WBC 13.1* 08/24/2015   PLT 304 08/24/2015   Lab Results  Component Value Date   INR 0.85 08/09/2015   Lab Results  Component Value Date   NA 141 08/24/2015   K 4.4 08/24/2015   CL 106 08/24/2015   CO2 31 08/24/2015   BUN 9 08/24/2015   CREATININE 0.52 08/24/2015   GLUCOSE 115* 08/24/2015    Discharge Medications:     Medication List    TAKE these medications        alendronate 70 MG tablet  Commonly known as:  FOSAMAX  1 TAB PO WEEKLY EARLY AM BEFORE FOOD/MEDS W/WATER. DON'T LIE DOWN FOR30 MIN (OSTEOPOROSIS) *NO CRUSH*     amLODipine 10 MG tablet  Commonly known as:  NORVASC  Take 1 tablet (10 mg total) by mouth daily.     benzoyl peroxide 5 % external liquid  Generic drug:  benzoyl peroxide  USE TO [place] AFFECTED AREA DAILY     calcium-vitamin D 500-200 MG-UNIT tablet  Commonly known as:  OSCAL WITH D  Take 1 tablet by mouth daily with breakfast.  clindamycin 1 % external solution  Commonly known as:  CLEOCIN T  Apply topically daily.     clotrimazole 1 % cream  Commonly known as:  LOTRIMIN  Apply 1 application topically as needed. For rash in skin folds     DULoxetine 60 MG capsule  Commonly known as:  CYMBALTA  Take 1 capsule (60 mg total) by mouth daily.     enoxaparin 30 MG/0.3ML injection  Commonly known as:  LOVENOX  Inject 0.3 mLs (30 mg total) into the skin every 12 (twelve) hours.     HYDROcodone-acetaminophen 5-325 MG tablet  Commonly known as:  NORCO/VICODIN  Take 1 tablet by mouth every 6 (six) hours as needed for moderate pain.     lamoTRIgine 150 MG tablet  Commonly known as:  LAMICTAL  Take 2 tablets (300 mg total) by mouth 2 (two) times daily.     letrozole 2.5 MG tablet  Commonly known as:  FEMARA  Take 1 tablet (2.5 mg total) by mouth daily.     loratadine 10 MG tablet  Commonly known as:  CLARITIN  Take 1 tablet (10 mg total) by mouth daily.     LORazepam 1 MG tablet  Commonly known as:   ATIVAN  Take 1 tablet (1 mg total) by mouth every 8 (eight) hours as needed (take one tablet by mouth as needed for seizure lasting longer than 3 min.  or more than 2 seizures in 2 hours).     losartan 100 MG tablet  Commonly known as:  COZAAR  Take 1 tablet (100 mg total) by mouth daily.     meloxicam 7.5 MG tablet  Commonly known as:  MOBIC  Take 1 tablet (7.5 mg total) by mouth 2 (two) times daily. With meals     omeprazole 20 MG capsule  Commonly known as:  PRILOSEC  TAKE 1 CAPSULE BY MOUTH TWICE DAILY FOR GERD. (DO NOT CRUSH)     oxyCODONE 5 MG immediate release tablet  Commonly known as:  Oxy IR/ROXICODONE  Take 1-2 tablets (5-10 mg total) by mouth every 4 (four) hours as needed for severe pain.     terbinafine 1 % cream  Commonly known as:  LAMISIL  Apply to affected area 2 times daily until clear and then 2 times a week for suppression        Diagnostic Studies: Dg Hip Port Unilat With Pelvis 1v Right  08/23/2015  CLINICAL DATA:  Right hip arthroplasty. EXAM: DG HIP (WITH OR WITHOUT PELVIS) 1V PORT RIGHT COMPARISON:  None. FINDINGS: Right hip arthroplasty changes noted without complicating features. Soft tissue postoperative changes and drain noted. Scans within the proximal left femur are noted. IMPRESSION: Right hip arthroplasty without definite complicating features. Electronically Signed   By: Margarette Canada M.D.   On: 08/23/2015 14:43    Disposition: [facility] Health Care Institution Not Defined      Discharge Instructions    Diet - low sodium heart healthy    Complete by:  As directed      Increase activity slowly    Complete by:  As directed            Follow-up Information    Follow up with Dereck Leep, MD On 10/03/2015.   Specialty:  Orthopedic Surgery   Why:  At 11 AM   Contact information:   [place] [address] [phone]      Please remove staples and apply steri strips on  09/06/15. Follow up with [facility]  ortho in 6 weeks   Signed: WOLFE,JON R. 08/24/2015, 7:04 AM

## 2015-08-24 NOTE — Clinical Social Work Note (Signed)
Clinical Social Work Assessment  Patient Details  Name: Helen Patton MRN: [id number] Date of Birth: 18-Oct-1960  Date of referral:  08/24/15               Reason for consult:  Facility Placement                Permission sought to share information with:  Chartered certified accountant granted to share information::  Yes, Verbal Permission Granted  Name::      Helen Patton::   Helen [facility]   Relationship::     Contact Information:     Housing/Transportation Living arrangements for the past 2 months:  [place] of Information:  Parent Patient Interpreter Needed:  None Criminal Activity/Legal Involvement Pertinent to Current Situation/Hospitalization:  No - Comment as needed Significant Relationships:  Parents Lives with:  Facility Resident Do you feel safe going back to the place where you live?  Yes Need for family participation in patient care:  Yes (Comment)  Care giving concerns:  Patient is a [facility] resident and had a planned surgery with [name].    Social Worker assessment / plan:  Holiday representative (CSW) received SNF consult. Patient had a planned hip surgery with [name]. CSW met with patient and her mother/ guardian [facility]. was at bedside. CSW introduced self and explained role of CSW department. Patient was very pleasant and was working with PT. Per mother patient has lived at [facility] since she was 14 y.o. Per mother she lives at the [place] independent living and prefers patient to go to [facility]. Mother is agreeable to SNF search in [place]. CSW explained to mother that patient will require a 3 night qualfying inpatient stay at [facility] in order for Medicare to pay for SNF. Patient was admitted to inpatient on 08/23/15. Mother verbalized her understanding. Mother also asked CSW to call patient's caregiver [facility].  FL2 complete and faxed out. CSW presented bed offers to  patient's mother and she chose [facility] Admissions coordinator at [facility] is aware of accepted bed offer. Plan is for patient to D/C to [facility] on Saturday 08/26/15. CSW also spoke with Helen Patton upon mother's request. Per Helen Patton she is the [place] home Freight forwarder and has been taking care of patient for years. Helen [facility] is agreeable of the above plan and reported that [facility] RN will come to see patient at some point at [facility]. Helen [facility]ton requested to ride in the EMS with patient to keep her calm so she won't be nervous. CSW explained that it depends on the EMS staff but they usually make exceptions for these circumstances. CSW will continue to follow and assist as needed.   Employment status:  Disabled (Comment on whether or not currently receiving Disability) Insurance information:  Medicare, Medicaid In [place] PT Recommendations:  [facility] / Referral to community resources:  [facility]  Patient/Family's Response to care:  Patient's mother is agreeable for patient to go to [facility] for rehab.   Patient/Family's Understanding of and Emotional Response to Diagnosis, Current Treatment, and Prognosis:  Patient and mother were pleasant and thanked CSW for visit.   Emotional Assessment Appearance:  Appears stated age Attitude/Demeanor/Rapport:    Affect (typically observed):  Pleasant Orientation:  Oriented to Self, Oriented to Place, Fluctuating Orientation (Suspected and/or reported Sundowners), Oriented to  Time Alcohol / Substance use:  Not Applicable Psych involvement (Current and /  or in the community):  No (Comment)  Discharge Needs  Concerns to be addressed:  Discharge Planning Concerns Readmission within the last 30 days:  No Current discharge risk:  Dependent with Mobility Barriers to Discharge:  Continued Medical Work up   [employer], Helen Beets, LCSW 08/24/2015, 10:56 AM

## 2015-08-24 NOTE — Progress Notes (Signed)
Pt has not voided but has a marginal amount of urine in her bladder. Scan showed 73ml of urine. Encouraged family to help patient to drink. IV fluids infusing will continue to monitor.

## 2015-08-24 NOTE — Clinical Social Work Placement (Signed)
   CLINICAL SOCIAL WORK PLACEMENT  NOTE  Date:  08/24/2015  Patient Details  Name: Helen Patton MRN: [id number] Date of Birth: November 26, 1960  Clinical Social Work is seeking post-discharge placement for this patient at the [place] level of care (*CSW will initial, date and re-position this form in  chart as items are completed):  Yes   Patient/family provided with [place] Work Department's list of facilities offering this level of care within the geographic area requested by the patient (or if unable, by the patient's family).  Yes   Patient/family informed of their freedom to choose among providers that offer the needed level of care, that participate in Medicare, Medicaid or managed care program needed by the patient, have an available bed and are willing to accept the patient.  Yes   Patient/family informed of [facility]'s ownership interest in [facility] and [facility], as well as of the fact that they are under no obligation to receive care at these facilities.  PASRR submitted to EDS on 08/22/15     PASRR number received on 08/23/15     Existing PASRR number confirmed on       FL2 transmitted to all facilities in geographic area requested by pt/family on 08/23/15     FL2 transmitted to all facilities within larger geographic area on       Patient informed that his/her managed care company has contracts with or will negotiate with certain facilities, including the following:        Yes   Patient/family informed of bed offers received.  Patient chooses bed at  [facility] )     Physician recommends and patient chooses bed at      Patient to be transferred to   on  .  Patient to be transferred to facility by       Patient family notified on   of transfer.  Name of family member notified:        PHYSICIAN       Additional Comment:    _______________________________________________ Eymi Lipuma, Veronia Beets,  LCSW 08/24/2015, 10:53 AM

## 2015-08-24 NOTE — Discharge Instructions (Signed)

## 2015-08-24 NOTE — Progress Notes (Signed)
Physical Therapy Treatment Patient Details Name: Helen Patton MRN: [id number] DOB: 08-Mar-1960 Today's Date: 08/24/2015    History of Present Illness Pt is a 55 y.o. 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  PMH includes MR, htn, MRSA, breast CA s/p L lumpectomy, falls, seizures, h/o hiatal hernia, anxiety, and PNA.    PT Comments    Pt limited in afternoon session by fatigue and desire to return to bed. Pt able to complete sit <> stand transfer and ambulate 69ft chair > bed with RW and min A x2 for both. Continues to require constant cueing and monitoring for posterior hip precautions. Very protective of RLE, bearing minimal weight through it with transfers and ambulation, and guarding in a strong quad set with nearly all supine TherEx. Pt remains very impulsive requiring succinct instructions to maintain safety.   Follow Up Recommendations  SNF     Equipment Recommendations  Rolling walker with 5" wheels    Recommendations for Other Services       Precautions / Restrictions Precautions Precautions: Posterior Hip;Fall Precaution Booklet Issued: Yes (comment) Precaution Comments: Please keep in pink foam hip abduction pillow in bed and in chair (pt automatically likes to cross her legs per caregiver and mother) Restrictions Weight Bearing Restrictions: Yes RLE Weight Bearing: Weight bearing as tolerated    Mobility  Bed Mobility Overal bed mobility: Needs Assistance Bed Mobility: Sit to Supine     Supine to sit: Max assist;+2 for physical assistance;HOB elevated     General bed mobility comments: Pt with significant fatigue following transfer from chair, thus max A x2 for safety. Pt able to boost herself up in bed with bilat UEs on bedrails.    Transfers Overall transfer level: Needs  assistance Equipment used: Rolling walker (2 wheeled) Transfers: Sit to/from Stand Sit to Stand: Min assist;+2 physical assistance         General transfer comment: Verbal/tactile cueing for technique of transfer and DME use. Impulsive, attempting to sit too early resulting in decreased contact with EOB. Pt able to extend trunk to increase stability and help scoot hips backward.  Ambulation/Gait Ambulation/Gait assistance: Min assist;+2 physical assistance Ambulation Distance (Feet): 3 Feet Assistive device: Rolling walker (2 wheeled) Gait Pattern/deviations: Step-to pattern;Shuffle;Decreased stance time - right;Decreased step length - right;Decreased step length - left;Decreased weight shift to right;Antalgic;Narrow base of support Gait velocity: increased   General Gait Details: Decreased weight bearing through RLE. Vc's for safety/DME use. Impulsive   Stairs    Wheelchair Mobility    Modified Rankin (Stroke Patients Only)       Balance Overall balance assessment: Needs assistance Sitting-balance support: Bilateral upper extremity supported;Feet supported Sitting balance-Leahy Scale: Fair     Standing balance support: Bilateral upper extremity supported (on RW) Standing balance-Leahy Scale: Fair      Cognition Arousal/Alertness: Awake/alert Behavior During Therapy: Impulsive Overall Cognitive Status: Difficult to assess Memory: Decreased recall of precautions    Exercises Total Joint Exercises Quad Sets: AROM;Strengthening;Both;10 reps;Supine Towel Squeeze: AROM;Strengthening;Both;10 reps;Supine (tactile cues for squeeze)   Ankle Circles/Pumps: AAROM;Strengthening;Left;10 reps;Supine Short Arc Quad: AAROM;Strengthening;Both;10 reps;Supine Heel Slides: AAROM;Strengthening;Left;10 reps;Supine (pt did not follow commands to perform heelsides on R LE (appeared to be guarding d/t pain) Hip ABduction/ADduction: AAROM;Strengthening;Both;10 reps;Supine (limited range  R d/t pt grimacing in pain) Straight Leg Raises: AAROM;Strengthening;Both;10 reps;Supine (limited range  R d/t pt grimacing in pain)  AAROM assist fluctuated (even from one repetition to the next) from 5-50% on the LLE and 50-75% on the RLE with above TherEx      General Comments General comments (skin integrity, edema, etc.): Hemovac drain in tact.   Nursing cleared pt for participation in physical therapy.  Pt agreeable to PT session.      Pertinent Vitals/Pain Pain Assessment: Faces Faces Pain Scale: Hurts even more (with activity) Pain Location: R hip Pain Descriptors / Indicators: Grimacing;Guarding Pain Intervention(s): Limited activity within patient's tolerance;Monitored during session;Premedicated before session;Repositioned  HR and O2 monitored throughout session and maintained WFL.     Home Living Family/patient expects to be discharged to:: Skilled nursing facility Additional Comments: Pt previously from group home with caregiver.  No stairs required.    Prior Function Level of Independence: Needs assistance    ADL's / Homemaking Assistance Needed: Assist for bathing but pt was able to dress self with help with socks and shoes. Comments: (Per last admission notes and caregiver report today):  Per caregiver (within facility), at true baseline, patient is ambulatory with minimal/no use of assist device.  However, since January 2017 (due to progressive R hip pain and recommendations of Dr. Hooten/orthopedist), patient has been exclusively WC level, performing SPT with +1 assist to/from all surfaces.  Progressively limited use of R LE noted due to pain.   PT Goals (current goals can now be found in the care plan section) Acute Rehab PT Goals Patient Stated Goal: pt not able to state goal at this time but goals discussed with her mother PT Goal Formulation: With patient/family Time For Goal Achievement: 09/07/15 Potential to Achieve Goals: Good Progress towards PT goals:  Progressing toward goals    Frequency  BID    PT Plan Current plan remains appropriate       End of Session Equipment Utilized During Treatment: Gait belt;Oxygen Activity Tolerance: Patient tolerated treatment well;Patient limited by lethargy Patient left: in bed;with call bell/phone within reach;with bed alarm set;with family/visitor present;with SCD's reapplied (Hip abduction pillow in place; B heels elevated on towel rol)     Time: [id number] PT Time Calculation (min) (ACUTE ONLY): 30 min  Charges:  $Therapeutic Exercise: 8-22 mins                    G Codes:      Tamia Dial, SPT 08/24/2015, 2:18 PM

## 2015-08-24 NOTE — Progress Notes (Signed)
OT Cancellation Note  Patient Details Name: [facility] MRN: [id number] DOB: May 03, 1960   Cancelled Treatment:    Reason Eval/Treat Not Completed: Fatigue/lethargy limiting ability to participate   Chart reviewed and evaluation attempted, but pt lethargic and caregiver requested no assessment until later in day when "she is not as groggy".  Will attempt later today.    Chrys Racer, OTR/L ascom [phone] 08/24/2015, 9:27 AM

## 2015-08-24 NOTE — Evaluation (Signed)
Occupational Therapy Evaluation Patient Details Name: Helen Patton MRN: [id number] DOB: August 23, 1960 Today's Date: 08/24/2015    History of Present Illness Pt is a 55 y.o. 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  PMH includes MR, htn, MRSA, breast CA s/p L lumpectomy, falls, seizures, h/o hiatal hernia, anxiety, and PNA.   Clinical Impression    Pt. Is a 55 y.o. female who was admitted to [facility] for a R posterior THA.  She was living at a group home prior to surgery with assistance for bathing and donning/doffing socks and shoes. She is having difficulty recalling hip precautions and has pink foam positioner in place to keep proper position.  She was lethargic from pain medication and not able to follow instructions for AD and per discussion with her mother, she might not be able to understand the concept of using AD for LB dressing. She currently requires total assist for LB dressing skills and OT focus may be more for caregiver and family ed and training on how to assist with ADLs while observing posterior hip precautions. She  presents with pain, limited ROM, weakness, limited activity tolerance, and impaired functional mobility for ADLs. She could benefit from skilled OT services for ADL and A/E retraining, and functional mobility for ADLs in order to improve overall ADL functioning, and return to PLOF.  Rec SNF after discharge from hospital to achieve highest level of independence in ADLs before returning to group home.     Follow Up Recommendations  SNF    Equipment Recommendations       Recommendations for Other Services       Precautions / Restrictions Precautions Precautions: Posterior Hip;Fall Precaution Comments: Please keep in pink foam hip abduction pillow in bed and in chair (pt automatically  likes to cross her legs per caregiver and mother) Restrictions Weight Bearing Restrictions: Yes RLE Weight Bearing: Weight bearing as tolerated      Mobility Bed Mobility Overal bed mobility: Needs Assistance Bed Mobility: Supine to Sit     Supine to sit: Min assist;Mod assist;+2 for physical assistance;HOB elevated     General bed mobility comments: assist for R LE and trunk to sit on edge of bed; use of bedrail; vc's and assist required to maintain hip precautions and for safe technique; impulsivity noted  Transfers Overall transfer level: Needs assistance Equipment used: Rolling walker (2 wheeled) Transfers: Sit to/from Stand Sit to Stand: Min assist;+2 physical assistance         General transfer comment: vc's required for transfer technique including hand placement, safety, and use of walker    Balance Overall balance assessment: Needs assistance Sitting-balance support: Bilateral upper extremity supported;Feet supported Sitting balance-Leahy Scale: Fair     Standing balance support: Bilateral upper extremity supported (on RW) Standing balance-Leahy Scale: Fair                              ADL Overall ADL's : Needs assistance/impaired                                       General ADL Comments: Pt currently requires total assist for LB dressing skills due to difficulty following hip precautions and decreased  leval of alertness during assessment to follow instructions to use AD.  Spoke to patient's mother at length about whether or not patient would be able to understand how to use a reacher and sock aid for ADLs and she rec trying but doubted she would be able to understand the concept of use.  Most likely OT will focus on family and caregiver ed and training on how to assist with bathing and dressing skills while helping pt observe post hip precautions.     Vision     Perception     Praxis      Pertinent Vitals/Pain Pain  Assessment: Faces Pain Score: 2  Faces Pain Scale: Hurts a little bit Pain Location: R hip Pain Descriptors / Indicators: Grimacing;Guarding Pain Intervention(s): Limited activity within patient's tolerance;Monitored during session;Premedicated before session     Hand Dominance Right   Extremity/Trunk Assessment Upper Extremity Assessment Upper Extremity Assessment: Overall WFL for tasks assessed   Lower Extremity Assessment Lower Extremity Assessment: Defer to PT evaluation RLE Deficits / Details: Pt guarded and did not allow R hip flexion to assess; good quality quad set; at least 3-/5 R hip abduction/adduction to neutral; pt with minimal R ankle AROM participation (appearing to guard) RLE: Unable to fully assess due to pain LLE Deficits / Details: appears at least 3/5 with DF/PF, quad sets, hip abd/adduction to neutral; and heelsides       Communication Communication Communication: Expressive difficulties   Cognition Arousal/Alertness: Lethargic;Suspect due to medications Behavior During Therapy:  (Difficulty attending due to lethargy from meds) Overall Cognitive Status: Difficult to assess       Memory: Decreased recall of precautions             General Comments       Exercises       Shoulder Instructions      Home Living Family/patient expects to be discharged to:: Skilled nursing facility                                 Additional Comments: Pt previously from group home with caregiver.  No stairs required.      Prior Functioning/Environment Level of Independence: Needs assistance    ADL's / Homemaking Assistance Needed: Assist for bathing but pt was able to dress self with help with socks and shoes.   Comments: (Per last admission notes and caregiver report today):  Per caregiver (within facility), at true baseline, patient is ambulatory with minimal/no use of assist device.  However, since January 2017 (due to progressive R hip pain and  recommendations of Dr. Hooten/orthopedist), patient has been exclusively WC level, performing SPT with +1 assist to/from all surfaces.  Progressively limited use of R LE noted due to pain.    OT Diagnosis: Acute pain   OT Problem List: Decreased activity tolerance;Decreased strength;Decreased range of motion;Decreased safety awareness;Decreased knowledge of precautions;Decreased knowledge of use of DME or AE   OT Treatment/Interventions: Self-care/ADL training;DME and/or AE instruction;Patient/family education;Therapeutic activities    OT Goals(Current goals can be found in the care plan section) Acute Rehab OT Goals Patient Stated Goal: pt not able to state goal at this time but goals discussed with her mother OT Goal Formulation: With family Time For Goal Achievement: 09/07/15 Potential to Achieve Goals: Good ADL Goals Pt Will Perform Lower Body Dressing: with mod assist;with caregiver independent in assisting;sit to/from stand (with or without AE for adhering to post hip precautions)  Additional ADL Goal #1: family and caregiver will be able to state and demonstrate follow through of posterior hip precautions during ADLs  OT Frequency: Min 1X/week   Barriers to D/C:            Co-evaluation              End of Session    Activity Tolerance: Patient limited by lethargy Patient left: in chair;with call bell/phone within reach;with chair alarm set;with family/visitor present   Time: [id number] OT Time Calculation (min): 15 min Charges:  OT General Charges $OT Visit: 1 Procedure OT Evaluation $OT Eval Moderate Complexity: 1 Procedure G-Codes:     Chrys Racer, OTR/L ascom [phone] 08/24/2015, 1:19 PM

## 2015-08-24 NOTE — Care Management Note (Signed)
Case Management Note  Patient Details  Name: Helen Patton MRN: [id number] Date of Birth: 09-Aug-1960  Subjective/Objective:     Discussed case with CSW Blima Rich, Plan is for patient to discharge to SNF.  Will continue to monitor for further needs.              Action/Plan: Anticipated discharge plan is SNF. CSW following.   Expected Discharge Date:                  Expected Discharge Plan:  Skilled Nursing Facility  In-House Referral:  Clinical Social Work  Discharge planning Services  CM Consult  Post Acute Care Choice:    Choice offered to:     DME Arranged:    DME Agency:     HH Arranged:    [place] Agency:     Status of Service:  In process, will continue to follow  If discussed at Long Length of Stay Meetings, dates discussed:    Additional Comments:  Alvie Heidelberg, RN 08/24/2015, 11:02 AM

## 2015-08-24 NOTE — Evaluation (Signed)
**Note Helen-Identified via Obfuscation** Physical Therapy Evaluation Patient Details Name: Helen Patton MRN: [id number] DOB: [birth year] Today's Date: 08/24/2015   History of Present Illness  Pt is a 55 y.o. 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  PMH includes MR, htn, MRSA, breast CA s/p L lumpectomy, falls, seizures, h/o hiatal hernia, anxiety, and PNA.  Clinical Impression  Prior to admission, pt was performing stand pivot transfers with 1 assist since January (prior to this pt was ambulatory without AD).  Pt lives at a group home with caregiver assist.  Currently pt is min to mod assist x2 for bed mobility, and min assist x2 for transfers and ambulating a few feet with RW.  Pt impulsive requiring 2 assist for safety and pt noted to minimally WB through R LE (pt appeared to guard to minimize pain during session).  Pt able to follow simple one step commands 60-70% of the time and required constant monitoring to maintain posterior hip precautions.  Pt would benefit from skilled PT to address noted impairments and functional limitations.  Recommend pt discharge to STR when medically appropriate.    Follow Up Recommendations SNF    Equipment Recommendations  Rolling walker with 5" wheels    Recommendations for Other Services       Precautions / Restrictions Precautions Precautions: Posterior Hip;Fall Precaution Comments: Please keep in pink foam hip abduction pillow in bed and in chair (pt automatically likes to cross her legs per caregiver and mother) Restrictions Weight Bearing Restrictions: Yes RLE Weight Bearing: Weight bearing as tolerated      Mobility  Bed Mobility Overal bed mobility: Needs Assistance Bed Mobility: Supine to Sit     Supine to sit: Min assist;Mod assist;+2 for physical assistance;HOB elevated     General bed  mobility comments: assist for R LE and trunk to sit on edge of bed; use of bedrail; vc's and assist required to maintain hip precautions and for safe technique; impulsivity noted  Transfers Overall transfer level: Needs assistance Equipment used: Rolling walker (2 wheeled) Transfers: Sit to/from Stand Sit to Stand: Min assist;+2 physical assistance         General transfer comment: vc's required for transfer technique including hand placement, safety, and use of walker  Ambulation/Gait Ambulation/Gait assistance: Min assist;+2 physical assistance Ambulation Distance (Feet): 3 Feet (bed to chair) Assistive device: Rolling walker (2 wheeled)   Gait velocity: increased   General Gait Details: minimal WB'ing noted through R LE; impulsive; vc's required for safety and walker use  Stairs            Wheelchair Mobility    Modified Rankin (Stroke Patients Only)       Balance Overall balance assessment: Needs assistance Sitting-balance support: Bilateral upper extremity supported;Feet supported Sitting balance-Leahy Scale: Fair     Standing balance support: Bilateral upper extremity supported (on RW) Standing balance-Leahy Scale: Fair                               Pertinent Vitals/Pain Pain Assessment: Faces Faces Pain Scale: Hurts a little bit Pain Location: R hip Pain Descriptors / Indicators: Grimacing;Guarding Pain Intervention(s): Limited activity within patient's tolerance;Monitored during session;Premedicated before session;Repositioned  Pt's HR WFL during session. Pt's O2 noted to desaturate to 85% on RA once transferred to chair so  pt placed back on 2 L/min O2 via nasal cannula (nursing recently took pt off O2 onto RA and nursing notified that PT placed pt back on O2).    Home Living Family/patient expects to be discharged to:: Skilled nursing facility                 Additional Comments: Pt previously from group home with caregiver.  No  stairs required.    Prior Function Level of Independence: Needs assistance         Comments: (Per last admission notes and caregiver report today):  Per caregiver (within facility), at true baseline, patient is ambulatory with minimal/no use of assist device.  However, since January 2017 (due to progressive R hip pain and recommendations of Dr. Hooten/orthopedist), patient has been exclusively WC level, performing SPT with +1 assist to/from all surfaces.  Progressively limited use of R LE noted due to pain.     Hand Dominance        Extremity/Trunk Assessment   Upper Extremity Assessment: Defer to OT evaluation           Lower Extremity Assessment: RLE deficits/detail;LLE deficits/detail RLE Deficits / Details: Pt guarded and did not allow R hip flexion to assess; good quality quad set; at least 3-/5 R hip abduction/adduction to neutral; pt with minimal R ankle AROM participation (appearing to guard) LLE Deficits / Details: appears at least 3/5 with DF/PF, quad sets, hip abd/adduction to neutral; and heelsides     Communication   Communication: Expressive difficulties  Cognition Arousal/Alertness: Awake/alert Behavior During Therapy: Impulsive Overall Cognitive Status: Difficult to assess       Memory: Decreased recall of precautions              General Comments General comments (skin integrity, edema, etc.): R hip dressings intact; hemovac drain intact  Nursing cleared pt for participation in physical therapy.  Pt agreeable to PT session. Pt's mother and friend present during session.    Exercises Total Joint Exercises Ankle Circles/Pumps: AAROM;Strengthening;Both;10 reps;Supine Quad Sets: AROM;Strengthening;Both;10 reps;Supine Gluteal Sets: AROM;Strengthening;Both;10 reps;Supine Towel Squeeze: AROM;Strengthening;Both;10 reps;Supine Short Arc Quad: Strengthening;Both;10 reps;Supine (AROM L; AAROM R) Heel Slides: AAROM;Strengthening;Left;10 reps;Supine (pt did  not follow commands to perform heelsides on R LE (appeared to be guarding d/t pain)) Hip ABduction/ADduction: AAROM;Strengthening;Both;10 reps;Supine  Pt required vcs, tactile cues, and demonstration in order to perform above ex's.      Assessment/Plan    PT Assessment Patient needs continued PT services  PT Diagnosis Difficulty walking;Generalized weakness;Acute pain   PT Problem List Decreased strength;Decreased range of motion;Decreased activity tolerance;Decreased balance;Decreased mobility;Decreased knowledge of use of DME;Decreased safety awareness;Decreased knowledge of precautions;Pain  PT Treatment Interventions DME instruction;Gait training;Functional mobility training;Therapeutic activities;Therapeutic exercise;Balance training;Patient/family education   PT Goals (Current goals can be found in the Care Plan section) Acute Rehab PT Goals Patient Stated Goal: to be able to walk again PT Goal Formulation: With patient/family Time For Goal Achievement: 09/07/15 Potential to Achieve Goals: Good    Frequency BID   Barriers to discharge Decreased caregiver support      Co-evaluation               End of Session Equipment Utilized During Treatment: Gait belt Activity Tolerance: Patient tolerated treatment well Patient left: in chair;with call bell/phone within reach;with chair alarm set;with family/visitor present;with SCD's reapplied (Hip abduction pillow in place; B heels elevated via towel roll) Nurse Communication: Mobility status;Precautions;Weight bearing status (O2 desaturation with activity requiring supplemental O2)  Time: [id number] PT Time Calculation (min) (ACUTE ONLY): 39 min   Charges:   PT Evaluation $PT Eval Low Complexity: 1 Procedure PT Treatments $Therapeutic Exercise: 8-22 mins   PT G CodesLeitha Bleak 09/09/2015, 10:56 AM Leitha Bleak, PT [phone]

## 2015-08-24 NOTE — Progress Notes (Addendum)
Pt has been incontinent twice since last note. pts mother thinks the patient is more swollen. She point to the patients face. I do not see a difference, I did, however discontinue the IV fluid as she has picked up her po intake and has voided. I assessed her lungs and her extremities and found no change form this morning. I asked the patients mother if she could call the group home to find out the exact date of her last BM. I was told it was a "couple" of days before her admission to the hospital. MOM given.

## 2015-08-24 NOTE — Progress Notes (Signed)
   Subjective: 1 Day Post-Op Procedure(s) (LRB): TOTAL HIP REVISION (Right) HARDWARE REMOVAL CONVERSION TO TOTAL HIP Patient reports pain as moderate.  Not sure pt understands scale. Does not appear or act like she is in pain Patient is well, and has had no acute complaints or problems We will start therapy today.  Plan is to go Rehab after hospital stay. no nausea and no vomiting Patient denies any chest pains or shortness of breath. Objective: Vital signs in last 24 hours: Temp:  [96 F (35.6 C)-98.2 F (36.8 C)] 97.6 F (36.4 C) (07/13 0432) Pulse Rate:  [77-100] 92 (07/13 0432) Resp:  [11-20] 20 (07/12 2015) BP: (110-140)/(43-91) 140/67 mmHg (07/13 0432) SpO2:  [93 %-100 %] 98 % (07/13 0432) Weight:  [65.77 kg (144 lb 15.9 oz)] 65.77 kg (144 lb 15.9 oz) (07/12 1600) well approximated incision Heels are non tender and elevated off the bed using rolled towels Intake/Output from previous day: 07/12 0701 - 07/13 0700 In: 2800 [I.V.:2800] Out: 2330 [Urine:1500; Drains:130; Blood:700] Intake/Output this shift: Total I/O In: -  Out: 610 [Urine:500; Drains:110]   Recent Labs  08/24/15 0538  HGB 10.1*    Recent Labs  08/24/15 0538  WBC 13.1*  RBC 3.31*  HCT 30.3*  PLT 304    Recent Labs  08/24/15 0538  NA 141  K 4.4  CL 106  CO2 31  BUN 9  CREATININE 0.52  GLUCOSE 115*  CALCIUM 8.2*   No results for input(s): LABPT, INR in the last 72 hours.  EXAM General - Patient is Alert, Appropriate and Oriented Extremity - Neurologically intact Neurovascular intact Sensation intact distally Intact pulses distally Dorsiflexion/Plantar flexion intact Compartment soft Dressing - dressing C/D/I Motor Function - intact, moving foot and toes well on exam. Moving entire leg my her self without any evidence of pain  Past Medical History  Diagnosis Date  . Mental retardation   . GERD (gastroesophageal reflux disease)   . Osteoporosis   . Hypertension   . MRSA  (methicillin resistant staph aureus) culture positive 2012  . Hidradenitis 2012    MRSA, on chronic topical suppression  . Cancer [facility]) Oct 2015    invasive mammary carcinoma  . Breast cancer ([place]) 2015    [name]  . Falls   . Seizures ([place])     last seizure February 2017  . History of hiatal hernia   . Difficulty swallowing   . Anxiety   . Depression   . Hyperlipidemia   . Arthritis     osteoarthritis  . Pneumonia     aspiration pneumonia    Assessment/Plan: 1 Day Post-Op Procedure(s) (LRB): TOTAL HIP REVISION (Right) HARDWARE REMOVAL CONVERSION TO TOTAL HIP Active Problems:   S/P total hip arthroplasty  Estimated body mass index is 29.62 kg/(m^2) as calculated from the following:   Height as of this encounter: 4' 10.66" (1.49 m).   Weight as of this encounter: 65.77 kg (144 lb 15.9 oz). Advance diet Up with therapy D/C IV fluids Discharge to SNF  Labs: revirewed DVT Prophylaxis - Lovenox, Foot Pumps and TED hose Weight-Bearing as tolerated to right leg D/C O2 and Pulse OX and try on Room Air Labs tomorrow morning. Begin working on a bowel movement  Jon R. [place] 08/24/2015, 6:55 AM

## 2015-08-25 ENCOUNTER — Encounter
Admission: RE | Admit: 2015-08-25 | Discharge: 2015-08-25 | Disposition: A | Discharge status: Home / Self Care | Payer: Medicare Other | Source: Ambulatory Visit | Attending: Internal Medicine | Admitting: Internal Medicine

## 2015-08-25 LAB — CBC
HCT: 28.7 % — ABNORMAL LOW (ref 35.0–47.0)
Hemoglobin: 9.8 g/dL — ABNORMAL LOW (ref 12.0–16.0)
MCH: 31.1 pg (ref 26.0–34.0)
MCHC: 34.2 g/dL (ref 32.0–36.0)
MCV: 90.7 fL (ref 80.0–100.0)
PLATELETS: 316 10*3/uL (ref 150–440)
RBC: 3.16 MIL/uL — ABNORMAL LOW (ref 3.80–5.20)
RDW: 14.5 % (ref 11.5–14.5)
WBC: 15.3 10*3/uL — ABNORMAL HIGH (ref 3.6–11.0)

## 2015-08-25 LAB — BASIC METABOLIC PANEL
Anion gap: 6 (ref 5–15)
BUN: 11 mg/dL (ref 6–20)
CALCIUM: 8.5 mg/dL — AB (ref 8.9–10.3)
CO2: 29 mmol/L (ref 22–32)
CREATININE: 0.53 mg/dL (ref 0.44–1.00)
Chloride: 106 mmol/L (ref 101–111)
GFR calc Af Amer: >60 mL/min (ref >60)
GLUCOSE: 209 mg/dL — AB (ref 65–99)
POTASSIUM: 3.7 mmol/L (ref 3.5–5.1)
SODIUM: 141 mmol/L (ref 135–145)

## 2015-08-25 LAB — SURGICAL PATHOLOGY

## 2015-08-25 LAB — GLUCOSE, CAPILLARY: Glucose-Capillary: 175 mg/dL — ABNORMAL HIGH (ref 65–99)

## 2015-08-25 MED ORDER — ENSURE ENLIVE PO LIQD
237.0000 mL | Freq: Two times a day (BID) | ORAL | Status: DC
  Administered 2015-08-25 – 2015-08-26 (×2): 237 mL via ORAL

## 2015-08-25 NOTE — Progress Notes (Addendum)
  Subjective: 2 Days Post-Op Procedure(s) (LRB): TOTAL HIP REVISION (Right) HARDWARE REMOVAL CONVERSION TO TOTAL HIP Patient is a difficult historian. Mother in room, states patient is at baseline. Able to rest this am. Plan is to go Skilled nursing facility after hospital stay.  Objective: Vital signs in last 24 hours: Temp:  [97.5 F (36.4 C)-98.3 F (36.8 C)] 98.2 F (36.8 C) (07/14 0749) Pulse Rate:  [91-103] 103 (07/14 0749) Resp:  [18-20] 19 (07/14 0402) BP: (92-132)/(52-62) 132/57 mmHg (07/14 0749) SpO2:  [85 %-97 %] 93 % (07/14 0749)  Intake/Output from previous day: 07/13 0701 - 07/14 0700 In: 1140 [P.O.:540; I.V.:600] Out: 195 [Drains:195] Intake/Output this shift:     Recent Labs  08/24/15 0538  HGB 10.1*    Recent Labs  08/24/15 0538  WBC 13.1*  RBC 3.31*  HCT 30.3*  PLT 304    Recent Labs  08/24/15 0538  NA 141  K 4.4  CL 106  CO2 31  BUN 9  CREATININE 0.52  GLUCOSE 115*  CALCIUM 8.2*   No results for input(s): LABPT, INR in the last 72 hours.  EXAM General - Patient is Alert and Appropriate  Abdomen- soft nondistended, BS normal Extremity - Neurovascular intact Sensation intact distally Intact pulses distally Dorsiflexion/Plantar flexion intact No cellulitis present  Thigh soft Dressing - dressing C/D/I, no drainage and hemovac removed Motor Function - intact, moving foot and toes well on exam.   Past Medical History  Diagnosis Date  . Mental retardation   . GERD (gastroesophageal reflux disease)   . Osteoporosis   . Hypertension   . MRSA (methicillin resistant staph aureus) culture positive 2012  . Hidradenitis 2012    MRSA, on chronic topical suppression  . Cancer [facility]) Oct 2015    invasive mammary carcinoma  . Breast cancer ([place]) 2015    [name]  . Falls   . Seizures ([place])     last seizure February 2017  . History of hiatal hernia   . Difficulty swallowing   . Anxiety   . Depression   .  Hyperlipidemia   . Arthritis     osteoarthritis  . Pneumonia     aspiration pneumonia    Assessment/Plan:   2 Days Post-Op Procedure(s) (LRB): TOTAL HIP REVISION (Right) HARDWARE REMOVAL CONVERSION TO TOTAL HIP Active Problems:   S/P total hip arthroplasty  Estimated body mass index is 29.62 kg/(m^2) as calculated from the following:   Height as of this encounter: 4' 10.66" (1.49 m).   Weight as of this encounter: 65.77 kg (144 lb 15.9 oz). Advance diet Up with therapy  Vital signs and labs stable. Continue to monitor. Plan on discharge to Rehab Saturday pending BM  DVT Prophylaxis - Lovenox, Foot Pumps and TED hose Weight-Bearing as tolerated to right leg   T. Rachelle Hora, PA-C [place] 08/25/2015, 8:21 AM

## 2015-08-25 NOTE — Progress Notes (Signed)
Initial Nutrition Assessment  DOCUMENTATION CODES:   Not applicable  INTERVENTION:   Cater to pt preferences on Dysphagia III diet order Recommend Ensure Enlive po BID, each supplement provides 350 kcal and 20 grams of protein, to meet nutritional needs   NUTRITION DIAGNOSIS:   Inadequate oral intake related to acute illness as evidenced by meal completion < 50%, per patient/family report.  GOAL:   Patient will meet greater than or equal to 90% of their needs  MONITOR:   PO intake, Supplement acceptance, Labs, Weight trends, I & O's  REASON FOR ASSESSMENT:   Consult Poor PO  ASSESSMENT:   Pt admitted with hip fracture s/p total hip arthroplasty. Pt with h/o MR resides at [facility].  Past Medical History  Diagnosis Date  . Mental retardation   . GERD (gastroesophageal reflux disease)   . Osteoporosis   . Hypertension   . MRSA (methicillin resistant staph aureus) culture positive 2012  . Hidradenitis 2012    MRSA, on chronic topical suppression  . Cancer [facility]) Oct 2015    invasive mammary carcinoma  . Breast cancer ([place]) 2015    [name]  . Falls   . Seizures ([place])     last seizure February 2017  . History of hiatal hernia   . Difficulty swallowing   . Anxiety   . Depression   . Hyperlipidemia   . Arthritis     osteoarthritis  . Pneumonia     aspiration pneumonia    Diet Order:  Diet - low sodium heart healthy DIET DYS 3 Room service appropriate?: Yes; Fluid consistency:: Thin   Pt family ar bedside reports pt eating very well PTA until eating 3 meals per day and has not needed Ensures/Boost supplement but that since admission pt has not wanted to eat anything at all.  Family at bedside has been trying to come up with things she may like. Family reports she ate breakfast a little better this am but has only wanted some coca-cola all day.  Intake fluctuates per documentation.   Medications: Calcium-vitamin D, Ferrous Sulfate, Colace, NS  at 122mL/hr Labs: glucose 209   Gastrointestinal Profile: Last BM:  no BM documented   Nutrition-Focused Physical Exam Findings:  Unable to complete Nutrition-Focused physical exam at this time.    Weight Change: Pt family reports weight loss recently but unsure how much. Per [facility] weight trends, weight stable.   Skin:  Reviewed, no issues   Height:   Ht Readings from Last 1 Encounters:  08/23/15 4' 10.66" (1.49 m)    Weight:   Wt Readings from Last 1 Encounters:  08/23/15 144 lb 15.9 oz (65.77 kg)   Wt Readings from Last 10 Encounters:  08/23/15 144 lb 15.9 oz (65.77 kg)  08/09/15 145 lb (65.772 kg)  07/04/15 145 lb (65.772 kg)  05/19/15 147 lb (66.679 kg)  05/19/15 147 lb (66.679 kg)  05/11/15 147 lb (66.679 kg)  12/28/14 145 lb (65.772 kg)  06/22/14 147 lb (66.679 kg)  12/12/14 145 lb (65.772 kg)  11/17/14 145 lb (65.772 kg)     BMI:  Body mass index is 29.62 kg/(m^2).  Estimated Nutritional Needs:   Kcal:  1650-1980kcals  Protein:  73-86g protein  Fluid:  >/= 1.8L fluid  EDUCATION NEEDS:   Education needs no appropriate at this time  Dwyane Luo, RD, LDN Pager [phone] Weekend/On-Call Pager [phone]

## 2015-08-25 NOTE — Care Management Important Message (Signed)
Important Message  Patient Details  Name: Helen Patton MRN: [id number] Date of Birth: 05/21/1960   Medicare Important Message Given:  Yes    [name] 08/25/2015, 1:27 PM

## 2015-08-25 NOTE — Progress Notes (Signed)
Physical Therapy Treatment Patient Details Name: Helen Patton MRN: [id number] DOB: [birth year] Today's Date: 08/25/2015    History of Present Illness Pt is a 55 y.o. 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  PMH includes MR, htn, MRSA, breast CA s/p L lumpectomy, falls, seizures, h/o hiatal hernia, anxiety, and PNA.    PT Comments    Pt is making slight progress towards goals, demonstrated by decreased assist levels (CGA x2) for sit-to-stand transfers and ambulation x 26ft with RW. Pt still requiring assist of 2 secondary to impulsivity and safety concerns. Unable to progress ambulation this session d/t pt agitation and decreased activity tolerance; will re-attempt this PM with pre-medication for pain and increased coaching.     Follow Up Recommendations  SNF     Equipment Recommendations  Rolling walker with 5" wheels    Recommendations for Other Services       Precautions / Restrictions Precautions Precautions: Posterior Hip;Fall Precaution Comments: Please keep in pink foam hip abduction pillow in bed and in chair (pt automatically likes to cross her legs per caregiver and mother) Restrictions Weight Bearing Restrictions: Yes RLE Weight Bearing: Weight bearing as tolerated    Mobility  Bed Mobility Overal bed mobility: Needs Assistance Bed Mobility: Supine to Sit;Rolling Rolling: Mod assist Supine to sit: Min assist; Mod assist;+2 for physical assistance General bed mobility comments: Pt rolls both directions for diaper change using BUE on bed rails. Unable to participate with BLE d/t hip abduction pillow in place for safety. Achieves supine > sit with BUE (used with appropriate trunk extension to maintain posterior hip precautions) and assist for RLE and trunk. Occasional vc's for  techniqe/precautions.   Transfers Overall transfer level: Needs assistance Equipment used: Rolling walker (2 wheeled) Transfers: Sit to/from Stand Sit to Stand: Min guard;+2 safety/equipment General transfer comment: Quickly/impulsively assumes stand. Vc's for technique/DME use. Sits too early requiring max coaching/vc'ing to maintain safety.  Ambulation/Gait Ambulation/Gait assistance: Min guard;+2 safety/equipment Ambulation Distance (Feet): 3 Feet Assistive device: Rolling walker (2 wheeled) Gait Pattern/deviations: Shuffle;Antalgic;Narrow base of support Gait velocity: increased General Gait Details: Quick shuffle steps. RLE in contact with ground but significantly decreased stance time. Vc's for safety/DME use. Very impulsive.    Stairs    Wheelchair Mobility    Modified Rankin (Stroke Patients Only)       Balance Overall balance assessment: Needs assistance Sitting-balance support: Bilateral upper extremity supported Sitting balance-Leahy Scale: Fair Sitting balance - Comments: Fluctuating close standby to occasional Min A to maintain sitting balance   Standing balance support: Bilateral upper extremity supported (on RW) Standing balance-Leahy Scale: Fair    Cognition Arousal/Alertness: Awake/alert Behavior During Therapy: Impulsive Overall Cognitive Status: Difficult to assess Memory: Decreased recall of precautions    Exercises Total Joint Exercises Quad Sets: AROM;Strengthening;Both;10 reps;Supine Gluteal Sets: AROM;Strengthening;Both;10 reps;Supine Towel Squeeze: AROM;Strengthening;Both;10 reps;Supine (tactile cues for squeeze)  AAROM assist fluctuated (even from one repetition to the next) from 5-50% on the LLE and 50-75% on the RLE with below exercises:  Ankle Circles/Pumps: AAROM;Strengthening;Both;10 reps;Supine Towel Squeeze: AROM;Strengthening;Both;10 reps;Supine (tactile cues for squeeze) Short Arc Quad: AAROM;Strengthening;Both;10  reps;Supine Heel Slides: AAROM;Strengthening;Left;10 reps;Supine (unable to perform on the RLE d/t pt inability to break pt's strong quad set; pt not understanding verbal cues, tactile cues, or demonstration) Hip ABduction/ADduction: AAROM;Strengthening;Both;10 reps;Supine Straight Leg Raises: AAROM;Strengthening;Both;10 reps;Supine (limited  range R d/t pt guarding)    General Comments General comments (skin integrity, edema, etc.): Honeycomb dressing in tact R hip  Nursing cleared pt for participation in physical therapy.  Pt agreeable to PT session.        Pertinent Vitals/Pain Pain Assessment: Faces Faces Pain Scale: Hurts even more Pain Location: R hip Pain Descriptors / Indicators: Grimacing;Guarding Pain Intervention(s): Limited activity within patient's tolerance;Monitored during session;Repositioned (RN notified)  HR and SaO2 (on 2L O2) monitored throughout session and maintained WFL.            PT Goals (current goals can now be found in the care plan section) Acute Rehab PT Goals Patient Stated Goal: pt not able to state goal at this time but goals discussed with her mother PT Goal Formulation: With patient/family (pt's mother hopes pt will be able to walk again) Time For Goal Achievement: 09/07/15 Potential to Achieve Goals: Good Progress towards PT goals: Progressing toward goals    Frequency  BID    PT Plan Current plan remains appropriate       End of Session Equipment Utilized During Treatment: Gait belt;Oxygen Activity Tolerance: Patient limited by pain;Patient limited by fatigue Patient left: in chair;with call bell/phone within reach;with chair alarm set;with family/visitor present;with SCD's reapplied (hip abduction pillow in place; B heels elevated on towel rolls)     Time: 1000-1046 PT Time Calculation (min) (ACUTE ONLY): 46 min  Charges:                       G Codes:      Braheem Tomasik, SPT 08/25/2015, 12:17 PM

## 2015-08-25 NOTE — Progress Notes (Signed)
Physical Therapy Treatment Patient Details Name: Helen Patton MRN: [id number] DOB: [birth year] Today's Date: 08/25/2015    History of Present Illness Pt is a 55 y.o. 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  PMH includes MR, htn, MRSA, breast CA s/p L lumpectomy, falls, seizures, h/o hiatal hernia, anxiety, and PNA.    PT Comments    With increased encouragement and explicit goals, pt was able to show significant progress toward ambulation goal. Pt ambulated 29ft with RW and min A x2 and a close chair follow. Pt ambulates with short, shuffling steps but does bear weight through RLE without outward signs of pain/discomfort. Strong quad set persists on the R as pt guards for any mobility. Will continue to progress ambulation as tolerated.    Follow Up Recommendations  SNF     Equipment Recommendations  Rolling walker with 5" wheels    Recommendations for Other Services       Precautions / Restrictions Precautions Precautions: Posterior Hip;Fall Precaution Comments: Please keep in pink foam hip abduction pillow in bed and in chair (pt automatically likes to cross her legs per caregiver and mother) Restrictions Weight Bearing Restrictions: Yes RLE Weight Bearing: Weight bearing as tolerated    Mobility  Bed Mobility Overal bed mobility: Needs Assistance Bed Mobility: Sit to Supine Rolling:  (+3 for safety) Sit to supine: Max assist;HOB elevated (+3 physical assist for safety) General bed mobility comments: Pt fatigued and concern for slipping off EOB d/t short stature. Thus, max A x3. Max A x2 for boost up in bed.  Transfers Overall transfer level: Needs assistance Equipment used: Rolling walker (2 wheeled) Transfers: Sit to/from Stand Sit to Stand: Min guard;+2 safety/equipment General  transfer comment: Assist of 2 for safety. Quick/impulsive. Vc's for command. Pt needs max coaching to prevent premature sitting.   Ambulation/Gait Ambulation/Gait assistance: Min assist (+3; 2 for safety, 1 for close chair follow) Ambulation Distance (Feet): 22 Feet Assistive device: Rolling walker (2 wheeled) Gait Pattern/deviations: Decreased step length - right;Decreased step length - left;Shuffle;Narrow base of support Gait velocity: increased   General Gait Details: Vc's for safety, technique, and DME use. Constant coaching to progress distance. Does not require excessive UE support on RW, able to bear weight through RLE.  Stairs    Wheelchair Mobility    Modified Rankin (Stroke Patients Only)       Balance Overall balance assessment: Needs assistance Sitting-balance support: Bilateral upper extremity supported Sitting balance-Leahy Scale: Fair Standing balance support: Bilateral upper extremity supported (on RW) Standing balance-Leahy Scale: Fair    Cognition Arousal/Alertness: Awake/alert Behavior During Therapy: Impulsive Overall Cognitive Status: Difficult to assess Memory: Decreased recall of precautions    Exercises Total Joint Exercises Ankle Circles/Pumps: AAROM;Strengthening;Both;10 reps;Supine Quad Sets: AROM;Strengthening;Both;10 reps;Supine Gluteal Sets: AROM;Strengthening;Both;10 reps;Supine Towel Squeeze: AROM;Strengthening;Both;10 reps;Supine (tactile cues for squeeze) Short Arc Quad: AAROM;Strengthening;Both;10 reps;Supine Heel Slides: AAROM;Strengthening;Left;10 reps;Supine (limited range RLE d/t hip precautions) Hip ABduction/ADduction: AAROM;Strengthening;Both;10 reps;Supine Straight Leg Raises: AAROM;Strengthening;Both;10 reps;Supine (decreased range R d/t pt guarding)  *Pt requiring decreased assist levels with increased repetition of all exercises.    General Comments General comments (skin integrity, edema, etc.): Honeycomb dressing intact R  hip.  At conclusion of session, pt noted to need change of depends, RN tech notified and to help pt get cleaned up.      Pertinent Vitals/Pain  Pain Assessment: Faces Faces Pain Scale: Hurts a little bit Pain Location: R hip Pain Descriptors / Indicators: Grimacing;Guarding Pain Intervention(s): Limited activity within patient's tolerance;Monitored during session;Premedicated before session;Repositioned  HR and O2 monitored throughout session: At rest: HR 103bpm, SaO2 on 2L O2 100% Immediately following ambulation: HR 121bpm, SaO2 on 2L O2 98% At conclusion of session: HR 105bpm, SaO2 on 2L O2 98%            PT Goals (current goals can now be found in the care plan section) Acute Rehab PT Goals Patient Stated Goal: pt not able to state goal at this time but goals discussed with her mother PT Goal Formulation: With patient/family (pt's mother hopes pt will be able to walk again) Time For Goal Achievement: 09/07/15 Potential to Achieve Goals: Good Progress towards PT goals: Progressing toward goals    Frequency  BID    PT Plan Current plan remains appropriate       End of Session Equipment Utilized During Treatment: Gait belt;Oxygen (2L O2) Activity Tolerance: Patient limited by fatigue Patient left: in bed;with call bell/phone within reach;with bed alarm set;with family/visitor present;with SCD's reapplied (hip abduction pillow in place, B heels elevated on)     Time: [id number] PT Time Calculation (min) (ACUTE ONLY): 40 min  Charges:                    G Codes:      Korayma Hagwood, SPT 08/25/2015, 3:31 PM

## 2015-08-25 NOTE — Progress Notes (Signed)
Plan is for patient to D/C to [facility] Saturday 08/26/15 pending medical clearance. Per admissions coordinator at [facility] patient will go to room 223 (private room and medicaid certified bed). RN will call report at [phone]. Patient's mother and caregiver [name] are aware of above. Clinical [employer] (CSW) sent D/C orders to [facility] today. CSW will continue to follow and assist as needed.   [employer], LCSW [phone]

## 2015-08-25 NOTE — Progress Notes (Signed)
Occupational Therapy Treatment Patient Details Name: Helen Patton MRN: [id number] DOB: 11-13-1960 Today's Date: 08/25/2015    History of present illness Pt is a 55 y.o. 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  PMH includes MR, htn, MRSA, breast CA s/p L lumpectomy, falls, seizures, h/o hiatal hernia, anxiety, and PNA.   OT comments  Pt seen with her mother present to assess ability to participate in LB dressing skills training sitting up in chair.  Pt continues to be lethargic due to pain medications and is not able to verbalize in order to assess recall of posterior hip precautions.  Reviewed them with patient's mother who needs reminders about not leaning forward past 90 degrees.  Pt was not able to follow one step commands to try using reacher and was adamant about getting back into bed.  Pt to go to [facility] tomorrow for continued rehab.     Follow Up Recommendations  SNF    Equipment Recommendations       Recommendations for Other Services      Precautions / Restrictions Precautions Precautions: Posterior Hip;Fall Precaution Comments: Please keep in pink foam hip abduction pillow in bed and in chair (pt automatically likes to cross her legs per caregiver and mother) Restrictions Weight Bearing Restrictions: Yes RLE Weight Bearing: Weight bearing as tolerated       Mobility Bed Mobility Overal bed mobility: Needs Assistance Bed Mobility: Sit to Supine Rolling:  (+3 for safety)     Sit to supine: Max assist;HOB elevated (+3 physical assist for safety)   General bed mobility comments: Pt fatigued and concern for slipping off EOB d/t short stature. Thus, max A x3. Max A x2 for boost up in bed.  Transfers Overall transfer level: Needs assistance Equipment used: Rolling walker (2  wheeled) Transfers: Sit to/from Stand Sit to Stand: Min guard;+2 safety/equipment         General transfer comment: Assist of 2 for safety. Quick/impulsive. Vc's for command. Pt needs max coaching to prevent premature sitting.     Balance Overall balance assessment: Needs assistance Sitting-balance support: Bilateral upper extremity supported Sitting balance-Leahy Scale: Fair     Standing balance support: Bilateral upper extremity supported (on RW) Standing balance-Leahy Scale: Fair                     ADL Overall ADL's : Needs assistance/impaired                                       General ADL Comments: Pt continues to be sleepy on pain medications and discussed hip precautions with her and her mother. Pt not able to recall any precautions and is limited in verbal responses to state precautions.  No LB dressing skills training completed due to decreased comprehension and lethargy continues to limit participation.         Vision                     Perception     Praxis      Cognition   Behavior During Therapy: Impulsive Overall Cognitive Status: Difficult to assess       Memory: Decreased recall of precautions  Extremity/Trunk Assessment               Exercises Total Joint Exercises Ankle Circles/Pumps: AAROM;Strengthening;Both;10 reps;Supine Quad Sets: AROM;Strengthening;Both;10 reps;Supine Gluteal Sets: AROM;Strengthening;Both;10 reps;Supine Towel Squeeze: AROM;Strengthening;Both;10 reps;Supine (tactile cues for squeeze) Short Arc Quad: AAROM;Strengthening;Both;10 reps;Supine Heel Slides: AAROM;Strengthening;Left;10 reps;Supine (limited range RLE d/t hip precautions) Hip ABduction/ADduction: AAROM;Strengthening;Both;10 reps;Supine Straight Leg Raises: AAROM;Strengthening;Both;10 reps;Supine (decreased range R d/t pt guarding)   Shoulder Instructions       General Comments      Pertinent Vitals/  Pain       Pain Assessment: Faces Faces Pain Scale: Hurts little more Pain Location: R hip Pain Descriptors / Indicators: Grimacing;Operative site guarding;Guarding Pain Intervention(s): Limited activity within patient's tolerance;Monitored during session;Premedicated before session  Home Living                                          Prior Functioning/Environment              Frequency Min 1X/week     Progress Toward Goals  OT Goals(current goals can now be found in the care plan section)  Progress towards OT goals: Not progressing toward goals - comment (due to lethargy from pain meds and baseline cognitive level)  Acute Rehab OT Goals Patient Stated Goal: pt not able to state goal at this time but goals discussed with her mother OT Goal Formulation: With family Time For Goal Achievement: 09/07/15 Potential to Achieve Goals: Good  Plan Discharge plan remains appropriate    Co-evaluation                 End of Session     Activity Tolerance Patient limited by lethargy   Patient Left in chair;with call bell/phone within reach;with chair alarm set;with family/visitor present   Nurse Communication          Time: [id number] OT Time Calculation (min): 20 min  Charges: OT General Charges $OT Visit: 1 Procedure OT Treatments $Self Care/Home Management : 8-22 mins   Chrys Racer, OTR/L ascom [phone]  08/25/2015, 3:53 PM

## 2015-08-25 NOTE — Progress Notes (Signed)
Pt's mother called nurse to bedside stating that pt "doesn't look right". VS taken, WNL, no indications of pain, pt responsive, appropriate answers/gestures (pt non-verbal). Pt stated "I'm ok". Assessed for pain, no indicators found, no abnormalities noted. Will continue to monitor. Reported to oncoming shift.

## 2015-08-26 DIAGNOSIS — Z9181 History of falling: Secondary | ICD-10-CM | POA: Diagnosis not present

## 2015-08-26 DIAGNOSIS — R109 Unspecified abdominal pain: Secondary | ICD-10-CM | POA: Diagnosis not present

## 2015-08-26 DIAGNOSIS — Z853 Personal history of malignant neoplasm of breast: Secondary | ICD-10-CM | POA: Diagnosis not present

## 2015-08-26 DIAGNOSIS — R4189 Other symptoms and signs involving cognitive functions and awareness: Secondary | ICD-10-CM | POA: Diagnosis not present

## 2015-08-26 DIAGNOSIS — G809 Cerebral palsy, unspecified: Secondary | ICD-10-CM | POA: Diagnosis not present

## 2015-08-26 DIAGNOSIS — G40909 Epilepsy, unspecified, not intractable, without status epilepticus: Secondary | ICD-10-CM | POA: Diagnosis not present

## 2015-08-26 DIAGNOSIS — Z96641 Presence of right artificial hip joint: Secondary | ICD-10-CM | POA: Diagnosis not present

## 2015-08-26 DIAGNOSIS — R569 Unspecified convulsions: Secondary | ICD-10-CM | POA: Diagnosis not present

## 2015-08-26 DIAGNOSIS — F325 Major depressive disorder, single episode, in full remission: Secondary | ICD-10-CM | POA: Diagnosis not present

## 2015-08-26 DIAGNOSIS — M6281 Muscle weakness (generalized): Secondary | ICD-10-CM | POA: Diagnosis not present

## 2015-08-26 DIAGNOSIS — R0902 Hypoxemia: Secondary | ICD-10-CM | POA: Diagnosis not present

## 2015-08-26 DIAGNOSIS — I10 Essential (primary) hypertension: Secondary | ICD-10-CM | POA: Diagnosis not present

## 2015-08-26 DIAGNOSIS — Z471 Aftercare following joint replacement surgery: Secondary | ICD-10-CM | POA: Diagnosis not present

## 2015-08-26 DIAGNOSIS — R05 Cough: Secondary | ICD-10-CM | POA: Diagnosis not present

## 2015-08-26 DIAGNOSIS — Z79811 Long term (current) use of aromatase inhibitors: Secondary | ICD-10-CM | POA: Diagnosis not present

## 2015-08-26 DIAGNOSIS — M8700 Idiopathic aseptic necrosis of unspecified bone: Secondary | ICD-10-CM | POA: Diagnosis not present

## 2015-08-26 DIAGNOSIS — F419 Anxiety disorder, unspecified: Secondary | ICD-10-CM | POA: Diagnosis not present

## 2015-08-26 DIAGNOSIS — M810 Age-related osteoporosis without current pathological fracture: Secondary | ICD-10-CM | POA: Diagnosis not present

## 2015-08-26 DIAGNOSIS — K219 Gastro-esophageal reflux disease without esophagitis: Secondary | ICD-10-CM | POA: Diagnosis not present

## 2015-08-26 DIAGNOSIS — Z9989 Dependence on other enabling machines and devices: Secondary | ICD-10-CM | POA: Diagnosis not present

## 2015-08-26 DIAGNOSIS — F79 Unspecified intellectual disabilities: Secondary | ICD-10-CM | POA: Diagnosis not present

## 2015-08-26 DIAGNOSIS — R293 Abnormal posture: Secondary | ICD-10-CM | POA: Diagnosis not present

## 2015-08-26 DIAGNOSIS — R262 Difficulty in walking, not elsewhere classified: Secondary | ICD-10-CM | POA: Diagnosis not present

## 2015-08-26 DIAGNOSIS — M25551 Pain in right hip: Secondary | ICD-10-CM | POA: Diagnosis not present

## 2015-08-26 DIAGNOSIS — R1312 Dysphagia, oropharyngeal phase: Secondary | ICD-10-CM | POA: Diagnosis not present

## 2015-08-26 DIAGNOSIS — F329 Major depressive disorder, single episode, unspecified: Secondary | ICD-10-CM | POA: Diagnosis not present

## 2015-08-26 DIAGNOSIS — K449 Diaphragmatic hernia without obstruction or gangrene: Secondary | ICD-10-CM | POA: Diagnosis not present

## 2015-08-26 NOTE — Consult Note (Addendum)
[facility] [place] NAME: Helen Patton    MR#:  [id number]  DATE OF BIRTH:  1960/07/23  DATE OF ADMISSION:  08/23/2015  PRIMARY CARE PHYSICIAN: Wilhemena Durie, MD   REQUESTING/REFERRING PHYSICIAN: ortho-  CHIEF COMPLAINT:  Decreased responsiveness   HISTORY OF PRESENT ILLNESS:  Helen Patton  is a 55 y.o. female baseline non verbal - able to make simple sounds, seizure disorder  admitted 08/23/15 for right TRH. Underwent surgery without complication. Her mother noted the patient having an episode of decreased responsiveness - and starring. This was after receiving psych and pain medications. She does have a seizure history - grand mal numerous years ago and now what sounds like well controlled petit mal episodes.  Upon me entering the room the mom states "this is the best I've ever seen her"  PAST MEDICAL HISTORY:   Past Medical History  Diagnosis Date  . Mental retardation   . GERD (gastroesophageal reflux disease)   . Osteoporosis   . Hypertension   . MRSA (methicillin resistant staph aureus) culture positive 2012  . Hidradenitis 2012    MRSA, on chronic topical suppression  . Cancer [facility]) Oct 2015    invasive mammary carcinoma  . Breast cancer ([place]) 2015    [name]  . Falls   . Seizures ([place])     last seizure February 2017  . History of hiatal hernia   . Difficulty swallowing   . Anxiety   . Depression   . Hyperlipidemia   . Arthritis     osteoarthritis  . Pneumonia     aspiration pneumonia    PAST SURGICAL HISTOIRY:   Past Surgical History  Procedure Laterality Date  . Broken leg    . Dislocated hip    . Cervical ablation    . Breast surgery Left 12-07-13    T1b, Nx; ER+, PR+, her 2 pending. (Not candidate for chemotherapy)  . Total hip revision Right 08/23/2015    Procedure: TOTAL HIP REVISION;  Surgeon: Dereck Leep, MD;  Location: [facility] ORS;  Service: Orthopedics;  Laterality:  Right;  . Hardware removal  08/23/2015    Procedure: HARDWARE REMOVAL;  Surgeon: Dereck Leep, MD;  Location: [facility] ORS;  Service: Orthopedics;;  . Conversion to total hip  08/23/2015    Procedure: CONVERSION TO TOTAL HIP;  Surgeon: Dereck Leep, MD;  Location: [facility] ORS;  Service: Orthopedics;;    SOCIAL HISTORY:   Social History  Substance Use Topics  . Smoking status: Never Smoker   . Smokeless tobacco: Never Used  . Alcohol Use: No    FAMILY HISTORY:   Family History  Problem Relation Age of Onset  . Diabetes Father   . Parkinson's disease Father   . Cancer Maternal Aunt 63    breast  . Breast cancer Maternal Aunt   . Neuropathy Mother   . Arthritis Mother   . Diabetes Mother   . Autoimmune disease Brother     AIDS  . Hypertension Maternal Grandmother   . Cancer Paternal Grandmother     DRUG ALLERGIES:   Allergies  Allergen Reactions  . Sulfa Antibiotics Other (See Comments)    "Does not know"  . Keflex [Cephalexin] Other (See Comments)    "Does not know" unknown    REVIEW OF SYSTEMS:  Unable to obtain given mental status at baseline  MEDICATIONS AT HOME:   Prior to Admission medications   Medication Sig  Start Date End Date Taking? Authorizing Provider  alendronate (FOSAMAX) 70 MG tablet 1 TAB PO WEEKLY EARLY AM BEFORE FOOD/MEDS W/WATER. DON'T LIE DOWN FOR30 MIN (OSTEOPOROSIS) *NO CRUSH* Patient taking differently: 1 TAB PO WEEKLY EARLY AM BEFORE FOOD/MEDS W/WATER. DON'T LIE DOWN FOR30 MIN (OSTEOPOROSIS) *NO CRUSH* Tuesday 08/03/14  Yes Richard Maceo Pro., MD  amLODipine (NORVASC) 10 MG tablet Take 1 tablet (10 mg total) by mouth daily. Patient taking differently: Take 10 mg by mouth at bedtime.  04/05/15  Yes Richard Maceo Pro., MD  BENZOYL PEROXIDE 5 % external wash USE TO [facility] AFFECTED AREA DAILY 10/24/14  Yes Richard Maceo Pro., MD  calcium-vitamin D (OSCAL WITH D) 500-200 MG-UNIT per tablet Take 1 tablet by mouth daily with breakfast. 08/03/14   Yes Jerrol Banana., MD  clindamycin (CLEOCIN T) 1 % external solution Apply topically daily. 10/04/14  Yes Richard Maceo Pro., MD  clotrimazole (LOTRIMIN) 1 % cream Apply 1 application topically as needed. For rash in skin folds   Yes Historical Provider, MD  DULoxetine (CYMBALTA) 60 MG capsule Take 1 capsule (60 mg total) by mouth daily. 04/05/15  Yes Richard Maceo Pro., MD  HYDROcodone-acetaminophen (NORCO/VICODIN) 5-325 MG tablet Take 1 tablet by mouth every 6 (six) hours as needed for moderate pain. 05/22/15  Yes Gladstone Lighter, MD  lamoTRIgine (LAMICTAL) 150 MG tablet Take 2 tablets (300 mg total) by mouth 2 (two) times daily. 06/21/15  Yes Richard Maceo Pro., MD  letrozole [facility]) 2.5 MG tablet Take 1 tablet (2.5 mg total) by mouth daily. 06/21/15  Yes Richard Maceo Pro., MD  loratadine (CLARITIN) 10 MG tablet Take 1 tablet (10 mg total) by mouth daily. 04/05/15  Yes Richard Maceo Pro., MD  LORazepam (ATIVAN) 1 MG tablet Take 1 tablet (1 mg total) by mouth every 8 (eight) hours as needed (take one tablet by mouth as needed for seizure lasting longer than 3 min.  or more than 2 seizures in 2 hours). 05/22/15  Yes Gladstone Lighter, MD  losartan (COZAAR) 100 MG tablet Take 1 tablet (100 mg total) by mouth daily. 06/21/15  Yes Richard Maceo Pro., MD  meloxicam (MOBIC) 7.5 MG tablet Take 1 tablet (7.5 mg total) by mouth 2 (two) times daily. With meals 06/21/15  Yes Richard Maceo Pro., MD  omeprazole (PRILOSEC) 20 MG capsule TAKE 1 CAPSULE BY MOUTH TWICE DAILY FOR GERD. (DO NOT CRUSH) 12/28/14  Yes Jerrol Banana., MD  terbinafine (LAMISIL) 1 % cream Apply to affected area 2 times daily until clear and then 2 times a week for suppression Patient taking differently: as needed. Apply to affected area 2 times daily until clear and then 2 times a week for suppression ( on feet) 05/04/15  Yes Jerrol Banana., MD  enoxaparin (LOVENOX) 30 MG/0.3ML injection Inject 0.3 mLs  (30 mg total) into the skin every 12 (twelve) hours. 08/24/15   Watt Climes, PA  oxyCODONE (OXY IR/ROXICODONE) 5 MG immediate release tablet Take 1-2 tablets (5-10 mg total) by mouth every 4 (four) hours as needed for severe pain. 08/24/15   Watt Climes, PA      VITAL SIGNS:  Blood pressure 104/41, pulse 101, temperature 99.1 F (37.3 C), temperature source Oral, resp. rate 18, height 4' 10.66" (1.49 m), weight 144 lb 15.9 oz (65.77 kg), SpO2 94 %.  PHYSICAL EXAMINATION:  GENERAL:  55 y.o.-year-old patient lying in the bed with no  acute distress.  EYES: Pupils equal, round, reactive to light and accommodation. No scleral icterus. Extraocular muscles intact.  HEENT: Head atraumatic, normocephalic. Oropharynx and nasopharynx clear.  NECK:  Supple, no jugular venous distention. No thyroid enlargement, no tenderness.  LUNGS: Normal breath sounds bilaterally, no wheezing, rales,rhonchi or crepitation. No use of accessory muscles of respiration.  CARDIOVASCULAR: S1, S2 normal. No murmurs, rubs, or gallops.  ABDOMEN: Soft, nontender, nondistended. Bowel sounds present. No organomegaly or mass.  EXTREMITIES: No pedal edema, cyanosis, or clubbing.  NEUROLOGIC: Cranial nerves II through XII are intact. Muscle strength 5/5 in all extremities. Sensation intact. Gait not checked.  PSYCHIATRIC: The patient is alert and oriented x 3. Basically non verbal but able to answer simple questions SKIN: No obvious rash, lesion, or ulcer.   LABORATORY PANEL:   CBC  Recent Labs Lab 08/25/15 0901  WBC 15.3*  HGB 9.8*  HCT 28.7*  PLT 316   ------------------------------------------------------------------------------------------------------------------  Chemistries   Recent Labs Lab 08/25/15 0901  NA 141  K 3.7  CL 106  CO2 29  GLUCOSE 209*  BUN 11  CREATININE 0.53  CALCIUM 8.5*    ------------------------------------------------------------------------------------------------------------------  Cardiac Enzymes No results for input(s): TROPONINI in the last 168 hours. ------------------------------------------------------------------------------------------------------------------  RADIOLOGY:  No results found.  EKG:   Orders placed or performed during the hospital encounter of 09/26/14  . ED EKG  . ED EKG  . EKG    IMPRESSION AND PLAN:   55 year old female, history of seizure disorder admitted for R THR, medical consult for decreased responsiveness  1. Transient decreased responsiveness: likely in relation to medication, patient now back to baseline, no postictal state, would limit sedating agents as possible -- no further workup/intervention required  2. Seizure disorder: continue home medications 3. S/p R THR per ortho: 4. Essential htn: norvasc    All the records are reviewed and case discussed with Consulting provider. Management plans discussed with the patient, family and they are in agreement.  CODE STATUS: full  TOTAL TIME TAKING CARE OF THIS PATIENT: 33 minutes.    Hower,  Karenann Cai.D on 08/26/2015 at 11:16 AM  Between 7am to 6pm - Pager - [phone]  After 6pm: House Pager: - [phone]  [facility]  Office  [phone]  CC: Primary care Physician: Wilhemena Durie, MD

## 2015-08-26 NOTE — Progress Notes (Signed)
Mother at bedside called group home stating patient is unresponsive. Patiens VSS, alert, responsive to voice. Non verbal(baseline). No acute distress noted. Rachelle Hora PA paged and notified. Mother states patient has history of seizures, and lately has been staring into space and not following commands. Per Rachelle Hora PA, order placed for medical consult. Continue to monitor.

## 2015-08-26 NOTE — Progress Notes (Signed)
Patient discharging to [facility]. Report called to [place] at facility. EMS called. IV removed. Waiting on transport.

## 2015-08-26 NOTE — Progress Notes (Signed)
Patient is medically stable for D/C to [facility] today. Per RN at [facility] patient can come today to room 223. RN will call report at [phone] and arrange EMS for transport. Clinical Social Worker (CSW) sent D/C orders to [facility] on Friday 08/25/15. Medical Consult pending. Patient's mother Summer is at bedside and aware of above. Please reconsult if future social work needs arise. CSW signing off.   [employer], LCSW [phone]

## 2015-08-26 NOTE — Progress Notes (Signed)
  Subjective: 3 Days Post-Op Procedure(s) (LRB): TOTAL HIP REVISION (Right) HARDWARE REMOVAL CONVERSION TO TOTAL HIP Patient is a difficult historian. Mother in room, states patient is at baseline.  Plan is to go Skilled nursing facility after hospital stay.  Objective: Vital signs in last 24 hours: Temp:  [97.6 F (36.4 C)-98.7 F (37.1 C)] 98.7 F (37.1 C) (07/15 0726) Pulse Rate:  [98-106] 98 (07/15 0726) Resp:  [18] 18 (07/15 0726) BP: (107-147)/(49-77) 107/57 mmHg (07/15 0726) SpO2:  [97 %-100 %] 100 % (07/15 0726)  Intake/Output from previous day: 07/14 0701 - 07/15 0700 In: 720 [P.O.:720] Out: -  Intake/Output this shift:     Recent Labs  08/24/15 0538 08/25/15 0901  HGB 10.1* 9.8*    Recent Labs  08/24/15 0538 08/25/15 0901  WBC 13.1* 15.3*  RBC 3.31* 3.16*  HCT 30.3* 28.7*  PLT 304 316    Recent Labs  08/24/15 0538 08/25/15 0901  NA 141 141  K 4.4 3.7  CL 106 106  CO2 31 29  BUN 9 11  CREATININE 0.52 0.53  GLUCOSE 115* 209*  CALCIUM 8.2* 8.5*   No results for input(s): LABPT, INR in the last 72 hours.  EXAM General - Patient is Alert and Appropriate  Abdomen- soft nondistended, BS normal Extremity - Neurovascular intact Sensation intact distally Intact pulses distally Dorsiflexion/Plantar flexion intact No cellulitis present  Thigh soft Dressing - dressing C/D/I and no drainage Motor Function - intact, moving foot and toes well on exam.   Past Medical History  Diagnosis Date  . Mental retardation   . GERD (gastroesophageal reflux disease)   . Osteoporosis   . Hypertension   . MRSA (methicillin resistant staph aureus) culture positive 2012  . Hidradenitis 2012    MRSA, on chronic topical suppression  . Cancer [facility]) Oct 2015    invasive mammary carcinoma  . Breast cancer ([place]) 2015    [name]  . Falls   . Seizures ([place])     last seizure February 2017  . History of hiatal hernia   . Difficulty swallowing   .  Anxiety   . Depression   . Hyperlipidemia   . Arthritis     osteoarthritis  . Pneumonia     aspiration pneumonia    Assessment/Plan:   3 Days Post-Op Procedure(s) (LRB): TOTAL HIP REVISION (Right) HARDWARE REMOVAL CONVERSION TO TOTAL HIP Active Problems:   S/P total hip arthroplasty  Estimated body mass index is 29.62 kg/(m^2) as calculated from the following:   Height as of this encounter: 4' 10.66" (1.49 m).   Weight as of this encounter: 65.77 kg (144 lb 15.9 oz). Advance diet Up with therapy  Vital signs and labs stable.  Plan on discharge to Rehab Saturday today Follow up with [facility] in 6 weeks Remove staples and apply steri strips on 09/06/15  DVT Prophylaxis - Lovenox, Foot Pumps and TED hose Weight-Bearing as tolerated to right leg   T. Rachelle Hora, PA-C [place] 08/26/2015, 8:32 AM

## 2015-08-26 NOTE — Progress Notes (Signed)
Attempted to call report to [facility] 2x. No answer for [phone], and rings and goes to voicemail for number [phone]. Will attempt again shortly.

## 2015-08-26 NOTE — Clinical Social Work Placement (Signed)
   CLINICAL SOCIAL WORK PLACEMENT  NOTE  Date:  08/26/2015  Patient Details  Name: Helen Patton MRN: [id number] Date of Birth: 06-16-1960  Clinical Social Work is seeking post-discharge placement for this patient at the [place] level of care (*CSW will initial, date and re-position this form in  chart as items are completed):  Yes   Patient/family provided with [place] Work Department's list of facilities offering this level of care within the geographic area requested by the patient (or if unable, by the patient's family).  Yes   Patient/family informed of their freedom to choose among providers that offer the needed level of care, that participate in Medicare, Medicaid or managed care program needed by the patient, have an available bed and are willing to accept the patient.  Yes   Patient/family informed of [place]'s ownership interest in [facility] and [facility], as well as of the fact that they are under no obligation to receive care at these facilities.  PASRR submitted to EDS on 08/22/15     PASRR number received on 08/23/15     Existing PASRR number confirmed on       FL2 transmitted to all facilities in geographic area requested by pt/family on 08/23/15     FL2 transmitted to all facilities within larger geographic area on       Patient informed that his/her managed care company has contracts with or will negotiate with certain facilities, including the following:        Yes   Patient/family informed of bed offers received.  Patient chooses bed at  [name] )     Physician recommends and patient chooses bed at      Patient to be transferred to  [facility] ) on 08/26/15.  Patient to be transferred to facility by  [facility] )     Patient family notified on 08/26/15 of transfer.  Name of family member notified:   (Patient's mother Helen Patton is at bedside and aware of D/C today. )      PHYSICIAN       Additional Comment:    _______________________________________________ Mikhala Kenan, Bronwen Betters, LCSW 08/26/2015, 10:31 AM

## 2015-08-26 NOTE — Progress Notes (Signed)
Physical Therapy Treatment Patient Details Name: Helen Patton MRN: [id number] DOB: 02-Jan-1961 Today's Date: 08/26/2015    History of Present Illness Pt is a 55 y.o. 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  PMH includes IDD, HTN, MRSA, BrCA s/p L lumpectomy, falls, seizures, h/o hiatal hernia, anxiety, and PNA.    PT Comments    Pt received semirecumbent in bed with mother in room. The patient makes eye contact intermittently, but otherwise is minimally interactive. Trial on RA is successful with SaO2 at 90%, Hr: 104BPM; however returned to 1L at exit due to new AMS. The patient presents with a lesser degree of participation compared to past PT session. Bed exercises are attempted, but pt maintains rigid posturing throughout the majority of them, not following simple cues for limb/joint movement; even when positioned for SAQ, the patient maintains TKE with heel floating in air. Most successful are supine hip abduction and SLR, but those are also limited by guarding. The patient's mother reports this is a drastic change from her baseline and from past PT session and is concerned about the patient. I voiced these concerns to the RN. The patient's pain appears to be well managed with only minimal, intermittent grimacing at end of hip range during these activities. The patient does not appear to be distressed at this time.      Follow Up Recommendations  SNF     Equipment Recommendations  None recommended by PT    Recommendations for Other Services       Precautions / Restrictions Precautions Precautions: Posterior Hip;Fall Precaution Comments: Please keep in pink foam hip abduction pillow in bed and in chair (pt automatically likes to cross her legs per caregiver and mother) Restrictions Weight Bearing  Restrictions: Yes RLE Weight Bearing: Weight bearing as tolerated    Mobility  Bed Mobility Overal bed mobility:  (Not attempted this session, as patient is AMS compared to previous sessions, and is not following commands. )                Transfers                    Ambulation/Gait                 Stairs            Wheelchair Mobility    Modified Rankin (Stroke Patients Only)       Balance                                    Cognition Arousal/Alertness: Lethargic Behavior During Therapy: Flat affect Overall Cognitive Status: Impaired/Different from baseline (per mother in room ) Area of Impairment: Attention;Following commands       Following Commands: Follows one step commands inconsistently (Not following commands)            Exercises Total Joint Exercises Ankle Circles/Pumps: PROM;Right;10 reps;Supine (PROM attempted, pt maintains rigid posture, not following commands/cues for movement) Short Arc Quad: PROM;10 reps;Right;Supine (PROM attempted, pt maintains rigid posture, not following commands/cues for movement) Heel Slides: Right;PROM;5 reps;Supine (PROM attempted, pt maintains rigid posture, not following commands/cues for movement) Hip ABduction/ADduction: PROM;15 reps;Supine;Right Straight Leg Raises: 10 reps;Supine;PROM;Right (PROM easier due to weakness; pt maintains rigid TKE,  and is participatory toward the end. )    General Comments        Pertinent Vitals/Pain Pain Assessment:  (mild grimacing during PROM; pt not responding consistently for cues to rate pain. ) Pain Location: R hip  Pain Intervention(s): Limited activity within patient's tolerance;Monitored during session;Premedicated before session    Home Living                      Prior Function            PT Goals (current goals can now be found in the care plan section) Acute Rehab PT Goals Patient Stated Goal: pt not able to state  goal at this time but goals discussed with her mother PT Goal Formulation: With patient/family Time For Goal Achievement: 09/07/15 Potential to Achieve Goals: Good Progress towards PT goals: PT to reassess next treatment    Frequency  BID    PT Plan Current plan remains appropriate    Co-evaluation             End of Session Equipment Utilized During Treatment: Gait belt;Oxygen Activity Tolerance: Other (comment) (treatment limited due to acute AMS.) Patient left: in bed;with call bell/phone within reach;with bed alarm set;with family/visitor present;with SCD's reapplied     Time: 2863-8177 PT Time Calculation (min) (ACUTE ONLY): 20 min  Charges:  $Therapeutic Activity: 8-22 mins                    G Codes:      10:27 AM, 2015-09-06 Etta Grandchild, PT, DPT Physical Therapist - [place] [phone] (mobile)

## 2015-08-28 ENCOUNTER — Encounter: Payer: Self-pay | Admitting: Orthopedic Surgery

## 2015-08-28 DIAGNOSIS — R569 Unspecified convulsions: Secondary | ICD-10-CM | POA: Diagnosis not present

## 2015-08-28 DIAGNOSIS — M8700 Idiopathic aseptic necrosis of unspecified bone: Secondary | ICD-10-CM | POA: Diagnosis not present

## 2015-08-28 DIAGNOSIS — F79 Unspecified intellectual disabilities: Secondary | ICD-10-CM | POA: Diagnosis not present

## 2015-08-28 DIAGNOSIS — G809 Cerebral palsy, unspecified: Secondary | ICD-10-CM | POA: Diagnosis not present

## 2015-08-28 DIAGNOSIS — F325 Major depressive disorder, single episode, in full remission: Secondary | ICD-10-CM | POA: Diagnosis not present

## 2015-08-28 LAB — URINALYSIS COMPLETE WITH MICROSCOPIC (ARMC ONLY)
BACTERIA UA: NONE SEEN
Bilirubin Urine: NEGATIVE
Glucose, UA: NEGATIVE mg/dL
HGB URINE DIPSTICK: NEGATIVE
LEUKOCYTES UA: NEGATIVE
NITRITE: NEGATIVE
PH: 6 (ref 5.0–8.0)
PROTEIN: 30 mg/dL — AB
SPECIFIC GRAVITY, URINE: 1.023 (ref 1.005–1.030)

## 2015-08-29 LAB — URINE CULTURE: Culture: NO GROWTH

## 2015-09-07 DIAGNOSIS — R05 Cough: Secondary | ICD-10-CM | POA: Diagnosis not present

## 2015-09-12 ENCOUNTER — Encounter
Admission: RE | Admit: 2015-09-12 | Discharge: 2015-09-12 | Disposition: A | Discharge status: Home / Self Care | Payer: Medicare Other | Source: Ambulatory Visit | Attending: Internal Medicine | Admitting: Internal Medicine

## 2015-09-25 ENCOUNTER — Telehealth: Payer: Self-pay | Admitting: Family Medicine

## 2015-09-25 ENCOUNTER — Other Ambulatory Visit: Payer: Self-pay | Admitting: Gastroenterology

## 2015-09-25 DIAGNOSIS — R1310 Dysphagia, unspecified: Secondary | ICD-10-CM | POA: Diagnosis not present

## 2015-09-25 DIAGNOSIS — Z1211 Encounter for screening for malignant neoplasm of colon: Secondary | ICD-10-CM | POA: Diagnosis not present

## 2015-09-25 DIAGNOSIS — D649 Anemia, unspecified: Secondary | ICD-10-CM | POA: Diagnosis not present

## 2015-09-25 NOTE — Telephone Encounter (Signed)
Pt's caregiver called needing a RX for Colace.  When she left rehab she did not have any left.  She uses [facility].  Thanks, [employer]

## 2015-09-25 NOTE — Telephone Encounter (Signed)
Ok to send for her?

## 2015-09-25 NOTE — Telephone Encounter (Signed)
1 DAILY--THX

## 2015-09-26 DIAGNOSIS — F339 Major depressive disorder, recurrent, unspecified: Secondary | ICD-10-CM | POA: Diagnosis not present

## 2015-09-26 MED ORDER — DOCUSATE SODIUM 100 MG PO CAPS: 100.0000 mg | ORAL_CAPSULE | Freq: Every day | ORAL | 12 refills | Status: DC

## 2015-09-26 NOTE — Telephone Encounter (Signed)
Medication sent. Tried to call caregiver. No answer.

## 2015-10-03 DIAGNOSIS — Z96641 Presence of right artificial hip joint: Secondary | ICD-10-CM | POA: Diagnosis not present

## 2015-10-04 ENCOUNTER — Ambulatory Visit (INDEPENDENT_AMBULATORY_CARE_PROVIDER_SITE_OTHER): Payer: Medicare Other | Admitting: Family Medicine

## 2015-10-04 VITALS — BP 118/60 | HR 100 | Temp 98.3°F | Resp 14 | Wt 131.0 lb

## 2015-10-04 DIAGNOSIS — F79 Unspecified intellectual disabilities: Secondary | ICD-10-CM

## 2015-10-04 DIAGNOSIS — M160 Bilateral primary osteoarthritis of hip: Secondary | ICD-10-CM | POA: Diagnosis not present

## 2015-10-04 DIAGNOSIS — Z9660 Presence of unspecified orthopedic joint implant: Secondary | ICD-10-CM | POA: Diagnosis not present

## 2015-10-04 DIAGNOSIS — R2689 Other abnormalities of gait and mobility: Secondary | ICD-10-CM | POA: Diagnosis not present

## 2015-10-04 DIAGNOSIS — Z96649 Presence of unspecified artificial hip joint: Secondary | ICD-10-CM

## 2015-10-04 DIAGNOSIS — M6281 Muscle weakness (generalized): Secondary | ICD-10-CM | POA: Diagnosis not present

## 2015-10-04 DIAGNOSIS — E669 Obesity, unspecified: Secondary | ICD-10-CM

## 2015-10-04 MED ORDER — TERBINAFINE HCL 1 % EX CREA: TOPICAL_CREAM | CUTANEOUS | 2 refills | Status: DC

## 2015-10-04 MED ORDER — DOCUSATE SODIUM 100 MG PO CAPS: 100.0000 mg | ORAL_CAPSULE | Freq: Two times a day (BID) | ORAL | 12 refills | Status: DC

## 2015-10-04 NOTE — Progress Notes (Signed)
Subjective:  HPI  Patient is here for follow up after hip replacement surgery. She is doing well. Using a walker. She saw Dr Marry Guan yesterday and he was pleased and she will follow up again in 6 weeks. She was at [name] for rehab and has been back home for 10 days now. She also had more trouble with swallowing so she saw GI last week and Omeprazole was switched to Protonix. She is having Barium swallow tomorrow.  Prior to Admission medications   Medication Sig Start Date End Date Taking? Authorizing Provider  alendronate (FOSAMAX) 70 MG tablet 1 TAB PO WEEKLY EARLY AM BEFORE FOOD/MEDS W/WATER. DON'T LIE DOWN FOR30 MIN (OSTEOPOROSIS) *NO CRUSH* Patient taking differently: 1 TAB PO WEEKLY EARLY AM BEFORE FOOD/MEDS W/WATER. DON'T LIE DOWN FOR30 MIN (OSTEOPOROSIS) *NO CRUSH* Tuesday 08/03/14  Yes Sireen Halk Maceo Pro., MD  amLODipine (NORVASC) 10 MG tablet Take 1 tablet (10 mg total) by mouth daily. Patient taking differently: Take 10 mg by mouth at bedtime.  04/05/15  Yes Sible Straley Maceo Pro., MD  BENZOYL PEROXIDE 5 % external wash USE TO [name] 10/24/14  Yes Genessis Flanary Maceo Pro., MD  calcium-vitamin D (OSCAL WITH D) 500-200 MG-UNIT per tablet Take 1 tablet by mouth daily with breakfast. 08/03/14  Yes Jerrol Banana., MD  clindamycin (CLEOCIN T) 1 % external solution Apply topically daily. 10/04/14  Yes Briceson Broadwater Maceo Pro., MD  clotrimazole (LOTRIMIN) 1 % cream Apply 1 application topically as needed. For rash in skin folds   Yes Historical Provider, MD  docusate sodium (COLACE) 100 MG capsule Take 1 capsule (100 mg total) by mouth daily. 09/26/15  Yes Lysette Lindenbaum Maceo Pro., MD  DULoxetine (CYMBALTA) 60 MG capsule Take 1 capsule (60 mg total) by mouth daily. 04/05/15  Yes Jonahtan Manseau Maceo Pro., MD  lamoTRIgine (LAMICTAL) 150 MG tablet Take 2 tablets (300 mg total) by mouth 2 (two) times daily. 06/21/15  Yes Radwan Cowley Maceo Pro., MD  letrozole [facility]) 2.5 MG tablet Take 1  tablet (2.5 mg total) by mouth daily. 06/21/15  Yes Calistro Rauf Maceo Pro., MD  loratadine (CLARITIN) 10 MG tablet Take 1 tablet (10 mg total) by mouth daily. 04/05/15  Yes Johnwesley Lederman Maceo Pro., MD  LORazepam (ATIVAN) 1 MG tablet Take 1 tablet (1 mg total) by mouth every 8 (eight) hours as needed (take one tablet by mouth as needed for seizure lasting longer than 3 min.  or more than 2 seizures in 2 hours). 05/22/15  Yes Gladstone Lighter, MD  losartan (COZAAR) 100 MG tablet Take 1 tablet (100 mg total) by mouth daily. 06/21/15  Yes Willodene Stallings Maceo Pro., MD  pantoprazole (PROTONIX) 40 MG tablet Take 1 tablet by mouth 2 (two) times daily. 09/25/15  Yes Historical Provider, MD  terbinafine (LAMISIL) 1 % cream Apply to affected area 2 times daily until clear and then 2 times a week for suppression Patient taking differently: Use 2 times a week as needed 05/04/15  Yes Jaritza Duignan Maceo Pro., MD    Patient Active Problem List   Diagnosis Date Noted  . S/P total hip arthroplasty 08/23/2015  . Acute respiratory failure with hypoxia ([name]) 05/19/2015  . Avascular necrosis of femoral head ([name]) 03/07/2015  . Closed fracture of neck of right femur ([name]) 03/07/2015  . Anxiety 12/14/2014  . Arthralgia of hip 12/14/2014  . Athlete's foot 12/14/2014  . Cerebral palsy ([place]) 12/14/2014  . Cognitive decline 12/14/2014  .  Clinical depression 12/14/2014  . Acid reflux 12/14/2014  . HLD (hyperlipidemia) 12/14/2014  . BP (high blood pressure) 12/14/2014  . Methicillin resistant Staphylococcus aureus infection 12/14/2014  . Adiposity 12/14/2014  . Breast cancer ([name]) 11/21/2013  . Mental retardation 11/16/2013  . Seizure ([name]) 09/30/2013    Past Medical History:  Diagnosis Date  . Anxiety   . Arthritis    osteoarthritis  . Breast cancer ([name]) 2015   [name]  . Cancer [facility]) Oct 2015   invasive mammary carcinoma  . Depression   . Difficulty swallowing   . Falls   . GERD (gastroesophageal  reflux disease)   . Hidradenitis 2012   MRSA, on chronic topical suppression  . History of hiatal hernia   . Hyperlipidemia   . Hypertension   . Mental retardation   . MRSA (methicillin resistant staph aureus) culture positive 2012  . Osteoporosis   . Pneumonia    aspiration pneumonia  . Seizures [name]    Social History   Social History  . Marital status: Single    Spouse name: N/A  . Number of children: N/A  . Years of education: N/A   Occupational History  . Not on file.   Social History Main Topics  . Smoking status: Never Smoker  . Smokeless tobacco: Never Used  . Alcohol use No  . Drug use: No  . Sexual activity: No   Other Topics Concern  . Not on file   Social History Narrative  . No narrative on file    Allergies  Allergen Reactions  . Sulfa Antibiotics Other (See Comments)    "Does not know"  . Keflex [Cephalexin] Other (See Comments)    "Does not know" unknown    Review of Systems  Constitutional: Positive for malaise/fatigue.  Eyes: Negative.   Respiratory: Negative.   Cardiovascular: Negative.   Gastrointestinal:       Swallowing issues  Musculoskeletal: Positive for joint pain.  Skin: Negative.   Neurological: Positive for weakness.  Endo/Heme/Allergies: Negative.     Immunization History  Administered Date(s) Administered  . Influenza,inj,Quad PF,36+ Mos 10/29/2014   Objective:  BP 118/60   Pulse 100   Temp 98.3 F (36.8 C)   Resp 14   Wt 131 lb (59.4 kg)   BMI 26.77 kg/m   Physical Exam  Constitutional: She is well-developed, well-nourished, and in no distress.  HENT:  Head: Normocephalic and atraumatic.  Eyes: Conjunctivae are normal. Pupils are equal, round, and reactive to light.  Neck: Normal range of motion. Neck supple.  Cardiovascular: Normal rate, regular rhythm, normal heart sounds and intact distal pulses.   No murmur heard. Pulmonary/Chest: Effort normal and breath sounds  normal.  Abdominal: Soft.  Musculoskeletal:  Using walker today.  Neurological: She is alert. No cranial nerve deficit.  Skin: Skin is warm and dry.  Psychiatric: Mood, memory, affect and judgment normal.    Lab Results  Component Value Date   WBC 15.3 (H) 08/25/2015   HGB 9.8 (L) 08/25/2015   HCT 28.7 (L) 08/25/2015   PLT 316 08/25/2015   GLUCOSE 209 (H) 08/25/2015   TSH 2.55 12/28/2011   INR 0.85 08/09/2015    CMP     Component Value Date/Time   NA 141 08/25/2015 0901   NA 146 06/17/2013   NA 136 01/08/2013 1742   K 3.7 08/25/2015 0901   K 4.0 01/08/2013 1742   CL 106 08/25/2015 0901  CL 102 01/08/2013 1742   CO2 29 08/25/2015 0901   CO2 32 01/08/2013 1742   GLUCOSE 209 (H) 08/25/2015 0901   GLUCOSE 122 (H) 01/08/2013 1742   BUN 11 08/25/2015 0901   BUN 13 06/17/2013   BUN 21 (H) 01/08/2013 1742   CREATININE 0.53 08/25/2015 0901   CREATININE 0.74 01/08/2013 1742   CALCIUM 8.5 (L) 08/25/2015 0901   CALCIUM 9.6 01/08/2013 1742   PROT 7.0 08/09/2015 0914   PROT 7.3 01/08/2013 1742   ALBUMIN 4.4 08/09/2015 0914   ALBUMIN 4.1 01/08/2013 1742   AST 18 08/09/2015 0914   AST 39 (H) 01/08/2013 1742   ALT 14 08/09/2015 0914   ALT 50 01/08/2013 1742   ALKPHOS 84 08/09/2015 0914   ALKPHOS 108 01/08/2013 1742   BILITOT 0.4 08/09/2015 0914   BILITOT 0.4 01/08/2013 1742   GFRNONAA >60 08/25/2015 0901   GFRNONAA >60 01/08/2013 1742   GFRAA >60 08/25/2015 0901   GFRAA >60 01/08/2013 1742    Assessment and Plan :  1. S/P total hip arthroplasty Doing well. Continue physical therapy as needed. Following up with Dr Marry Guan. Using walker today.  2. Primary osteoarthritis of both hips  3. Mental retardation  4. Adiposity Lost 13 lbs per our scale since last visit. Discussed with patient and caregiver that she needs to slow down eating. 5. Microcephaly with mental retardation 6. Eating disorder Patient has swallow study later today. Patient was seen and examined by  Dr. Eulas Post and note was scribed by Theressa Millard, RMA.   I have done the exam and reviewed the above chart and it is accurate to the best of my knowledge.  Miguel Aschoff MD [facility] 10/04/2015 11:11 AM

## 2015-10-05 ENCOUNTER — Telehealth: Payer: Self-pay | Admitting: Family Medicine

## 2015-10-05 ENCOUNTER — Ambulatory Visit
Admission: RE | Admit: 2015-10-05 | Discharge: 2015-10-05 | Disposition: A | Discharge status: Home / Self Care | Payer: Medicare Other | Source: Ambulatory Visit | Attending: Gastroenterology | Admitting: Gastroenterology

## 2015-10-05 DIAGNOSIS — R1310 Dysphagia, unspecified: Secondary | ICD-10-CM

## 2015-10-05 DIAGNOSIS — K449 Diaphragmatic hernia without obstruction or gangrene: Secondary | ICD-10-CM | POA: Insufficient documentation

## 2015-10-05 NOTE — Telephone Encounter (Signed)
Becky pt caregiver states [facility] is waiting on a call from [place].  [phone]

## 2015-10-06 NOTE — Telephone Encounter (Signed)
Pharmacy closed right now will try again later-aa

## 2015-10-06 NOTE — Telephone Encounter (Signed)
Sandy at the pharmacy advised of medications that needed to be updated and D/C. Also they need a fax about this too. Will get this ready for Dr Rosanna Randy to sing and fax on Monday.-aa

## 2015-10-11 ENCOUNTER — Other Ambulatory Visit: Payer: Self-pay | Admitting: General Surgery

## 2015-10-11 DIAGNOSIS — C50912 Malignant neoplasm of unspecified site of left female breast: Secondary | ICD-10-CM

## 2015-10-11 DIAGNOSIS — M6281 Muscle weakness (generalized): Secondary | ICD-10-CM | POA: Diagnosis not present

## 2015-10-11 DIAGNOSIS — R2689 Other abnormalities of gait and mobility: Secondary | ICD-10-CM | POA: Diagnosis not present

## 2015-10-18 ENCOUNTER — Encounter: Payer: Self-pay | Admitting: *Deleted

## 2015-10-19 DIAGNOSIS — M6281 Muscle weakness (generalized): Secondary | ICD-10-CM | POA: Diagnosis not present

## 2015-10-19 DIAGNOSIS — R2689 Other abnormalities of gait and mobility: Secondary | ICD-10-CM | POA: Diagnosis not present

## 2015-10-24 DIAGNOSIS — W07XXXA Fall from chair, initial encounter: Secondary | ICD-10-CM | POA: Diagnosis not present

## 2015-10-24 DIAGNOSIS — Z0189 Encounter for other specified special examinations: Secondary | ICD-10-CM | POA: Diagnosis not present

## 2015-10-26 DIAGNOSIS — M6281 Muscle weakness (generalized): Secondary | ICD-10-CM | POA: Diagnosis not present

## 2015-10-26 DIAGNOSIS — R2689 Other abnormalities of gait and mobility: Secondary | ICD-10-CM | POA: Diagnosis not present

## 2015-11-02 DIAGNOSIS — R2689 Other abnormalities of gait and mobility: Secondary | ICD-10-CM | POA: Diagnosis not present

## 2015-11-02 DIAGNOSIS — M6281 Muscle weakness (generalized): Secondary | ICD-10-CM | POA: Diagnosis not present

## 2015-11-20 ENCOUNTER — Encounter: Payer: Self-pay | Admitting: *Deleted

## 2015-11-21 ENCOUNTER — Other Ambulatory Visit: Payer: Medicare Other

## 2015-11-21 ENCOUNTER — Ambulatory Visit: Payer: Medicare Other | Attending: General Surgery

## 2015-11-27 ENCOUNTER — Ambulatory Visit: Payer: Medicare Other | Admitting: General Surgery

## 2015-11-29 ENCOUNTER — Telehealth: Payer: Self-pay | Admitting: Family Medicine

## 2015-11-29 NOTE — Telephone Encounter (Signed)
Helen Patton her caregiver ask for refills on all her medications.  They use [facility].  Their call back at the group home is [phone]  Thanks [name]

## 2015-11-30 ENCOUNTER — Other Ambulatory Visit: Payer: Self-pay

## 2015-11-30 MED ORDER — PANTOPRAZOLE SODIUM 40 MG PO TBEC: 40.0000 mg | DELAYED_RELEASE_TABLET | Freq: Two times a day (BID) | ORAL | 12 refills | Status: DC

## 2015-11-30 MED ORDER — DOCUSATE SODIUM 100 MG PO CAPS: 100.0000 mg | ORAL_CAPSULE | Freq: Two times a day (BID) | ORAL | 12 refills | Status: DC

## 2015-11-30 MED ORDER — BENZOYL PEROXIDE 5 % EX LIQD: Freq: Every day | CUTANEOUS | 0 refills | Status: DC

## 2015-11-30 MED ORDER — CALCIUM CARBONATE-VITAMIN D 500-200 MG-UNIT PO TABS: 1.0000 | ORAL_TABLET | Freq: Every day | ORAL | 12 refills | Status: DC

## 2015-11-30 MED ORDER — CLINDAMYCIN PHOSPHATE 1 % EX SOLN: Freq: Every day | CUTANEOUS | 12 refills | Status: DC

## 2015-11-30 MED ORDER — LAMOTRIGINE 150 MG PO TABS: 300.0000 mg | ORAL_TABLET | Freq: Two times a day (BID) | ORAL | 12 refills | Status: DC

## 2015-11-30 MED ORDER — LETROZOLE 2.5 MG PO TABS: 2.5000 mg | ORAL_TABLET | Freq: Every day | ORAL | 12 refills | Status: DC

## 2015-11-30 MED ORDER — CLOTRIMAZOLE 1 % EX CREA: 1.0000 "application " | TOPICAL_CREAM | CUTANEOUS | 12 refills | Status: DC | PRN

## 2015-11-30 MED ORDER — LORAZEPAM 1 MG PO TABS: 1.0000 mg | ORAL_TABLET | Freq: Three times a day (TID) | ORAL | 0 refills | Status: DC | PRN

## 2015-11-30 MED ORDER — DULOXETINE HCL 60 MG PO CPEP: 60.0000 mg | ORAL_CAPSULE | Freq: Every day | ORAL | 12 refills | Status: DC

## 2015-11-30 MED ORDER — LOSARTAN POTASSIUM 100 MG PO TABS: 100.0000 mg | ORAL_TABLET | Freq: Every day | ORAL | 12 refills | Status: DC

## 2015-11-30 MED ORDER — LORATADINE 10 MG PO TABS: 10.0000 mg | ORAL_TABLET | Freq: Every day | ORAL | 12 refills | Status: DC

## 2015-11-30 MED ORDER — AMLODIPINE BESYLATE 10 MG PO TABS: 10.0000 mg | ORAL_TABLET | Freq: Every day | ORAL | 12 refills | Status: DC

## 2015-11-30 MED ORDER — TERBINAFINE HCL 1 % EX CREA: TOPICAL_CREAM | CUTANEOUS | 2 refills | Status: DC

## 2015-11-30 MED ORDER — ALENDRONATE SODIUM 70 MG PO TABS: ORAL_TABLET | ORAL | 12 refills | Status: DC

## 2015-11-30 NOTE — Telephone Encounter (Signed)
Is this ok?-aa 

## 2015-11-30 NOTE — Telephone Encounter (Signed)
Yes  thx

## 2015-12-07 ENCOUNTER — Telehealth: Payer: Self-pay | Admitting: Family Medicine

## 2015-12-07 NOTE — Telephone Encounter (Signed)
Helen Patton, at the group home called wanting to DC all previous Cymbalta orders. She ask for a new order for  Cymbalta 90 mg.  Helen Patton's call back is [phone]  Please fax to [name] [phone]  Thank sTeri

## 2015-12-07 NOTE — Telephone Encounter (Signed)
Is this ok?-aa 

## 2015-12-07 NOTE — Telephone Encounter (Signed)
I see 60mg  in chart.Where is 90mg  dose from?

## 2015-12-07 NOTE — Telephone Encounter (Signed)
Per [name] this was increased by Dr Tamera Punt and somehow changed to 60 mg when she went to rehab. Patient understands and this has been corrected-aa

## 2015-12-09 ENCOUNTER — Ambulatory Visit (INDEPENDENT_AMBULATORY_CARE_PROVIDER_SITE_OTHER): Payer: Medicare Other

## 2015-12-09 ENCOUNTER — Ambulatory Visit: Payer: Self-pay

## 2015-12-09 DIAGNOSIS — Z23 Encounter for immunization: Secondary | ICD-10-CM

## 2015-12-19 ENCOUNTER — Telehealth: Payer: Self-pay | Admitting: *Deleted

## 2015-12-19 DIAGNOSIS — F339 Major depressive disorder, recurrent, unspecified: Secondary | ICD-10-CM | POA: Diagnosis not present

## 2015-12-19 NOTE — Telephone Encounter (Signed)
Left message for patient to call the office back regarding her appointment. She was scheduled to come in on 12/21/15 but was a no show for her mammogram. Patient needs to r/s mammogram then calls [place] back with the date so she can r/s her appt here with Dr.Byrnett

## 2015-12-21 ENCOUNTER — Ambulatory Visit: Payer: Self-pay | Admitting: General Surgery

## 2016-01-19 ENCOUNTER — Ambulatory Visit
Admission: RE | Admit: 2016-01-19 | Discharge: 2016-01-19 | Disposition: A | Discharge status: Home / Self Care | Payer: Medicare Other | Source: Ambulatory Visit | Attending: General Surgery | Admitting: General Surgery

## 2016-01-19 ENCOUNTER — Other Ambulatory Visit: Payer: Self-pay | Admitting: General Surgery

## 2016-01-19 DIAGNOSIS — Z1231 Encounter for screening mammogram for malignant neoplasm of breast: Secondary | ICD-10-CM | POA: Diagnosis not present

## 2016-01-19 DIAGNOSIS — Z9889 Other specified postprocedural states: Secondary | ICD-10-CM | POA: Insufficient documentation

## 2016-01-19 DIAGNOSIS — C50912 Malignant neoplasm of unspecified site of left female breast: Secondary | ICD-10-CM | POA: Diagnosis present

## 2016-01-19 DIAGNOSIS — R928 Other abnormal and inconclusive findings on diagnostic imaging of breast: Secondary | ICD-10-CM | POA: Diagnosis not present

## 2016-01-25 ENCOUNTER — Telehealth: Payer: Self-pay

## 2016-01-25 ENCOUNTER — Encounter: Payer: Self-pay | Admitting: General Surgery

## 2016-01-25 ENCOUNTER — Ambulatory Visit (INDEPENDENT_AMBULATORY_CARE_PROVIDER_SITE_OTHER): Payer: Medicare Other | Admitting: General Surgery

## 2016-01-25 VITALS — BP 126/64 | HR 68 | Resp 12 | Ht 60.0 in | Wt 123.0 lb

## 2016-01-25 DIAGNOSIS — C50912 Malignant neoplasm of unspecified site of left female breast: Secondary | ICD-10-CM

## 2016-01-25 NOTE — Progress Notes (Signed)
Patient ID: Helen Patton, female   DOB: 02-Aug-1960, 55 y.o.   MRN: [id number]  Chief Complaint  Patient presents with  . Follow-up    mammogram     HPI Helen Patton is a 55 y.o. female who presents for a breast evaluation. The most recent mammogram was done on 01/19/2016  Patient does perform regular self breast checks and gets regular mammograms done.  [name] is present for exam, her caregiver. Patient states no new changes.  Patient doing well post hip replacement which postponed her scheduled follow-up by couple of months. The patient's mother was not able to accompany her today as she is in skilled nursing.     HPI  Past Medical History:  Diagnosis Date  . Anxiety   . Arthritis    osteoarthritis  . Breast cancer ([place]) 2015   [name]  . Cancer ([place]) 11/2013   T1c, Nx; ER/ PR +; Her 2 neg invasive mammary carcinoma left breast. Wide excision only based on Tumor Board review.   . Depression   . Difficulty swallowing   . Falls   . GERD (gastroesophageal reflux disease)   . Hidradenitis 2012   MRSA, on chronic topical suppression  . History of hiatal hernia   . Hyperlipidemia   . Hypertension   . Mental retardation   . MRSA (methicillin resistant staph aureus) culture positive 2012  . Osteoporosis   . Pneumonia    aspiration pneumonia  . Seizures [facility])    last seizure February 2017    Past Surgical History:  Procedure Laterality Date  . BREAST SURGERY Left 12-07-13   T1b, Nx; ER+, PR+, her 2 pending. (Not candidate for chemotherapy)  . broken leg    . CERVICAL ABLATION    . 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 dislocated hip    . HARDWARE REMOVAL  08/23/2015   Procedure: HARDWARE REMOVAL;  Surgeon: Dereck Leep, MD;  Location: [facility] ORS;  Service: Orthopedics;;  . TOTAL HIP REVISION Right 08/23/2015   Procedure: TOTAL HIP  REVISION;  Surgeon: Dereck Leep, MD;  Location: [facility] ORS;  Service: Orthopedics;  Laterality: Right;    Family History  Problem Relation Age of Onset  . Diabetes Father   . Parkinson's disease Father   . Neuropathy Mother   . Arthritis Mother   . Diabetes Mother   . Autoimmune disease Brother     AIDS  . Cancer Maternal Aunt 63    breast  . Breast cancer Maternal Aunt   . Hypertension Maternal Grandmother   . Cancer Paternal Grandmother     Social History Social History  Substance Use Topics  . Smoking status: Never Smoker  . Smokeless tobacco: Never Used  . Alcohol use No    Allergies  Allergen Reactions  . Sulfa Antibiotics Other (See Comments)    "Does not know"  . Keflex [Cephalexin] Other (See Comments)    "Does not know" unknown    Current Outpatient Prescriptions  Medication Sig Dispense Refill  . alendronate (FOSAMAX) 70 MG tablet 1 TAB PO WEEKLY EARLY AM BEFORE FOOD/MEDS W/WATER. DON'T LIE DOWN FOR30 MIN (OSTEOPOROSIS) *NO CRUSH* 5 tablet 12  . amLODipine (NORVASC) 10 MG tablet Take 1 tablet (10 mg total) by mouth at bedtime. 30 tablet 12  . benzoyl peroxide (BENZOYL PEROXIDE) 5 % external liquid Apply topically  at bedtime. 237 g 0  . calcium-vitamin D (OSCAL WITH D) 500-200 MG-UNIT tablet Take 1 tablet by mouth daily with breakfast. 30 tablet 12  . clindamycin (CLEOCIN T) 1 % external solution Apply topically daily. 60 mL 12  . clotrimazole (LOTRIMIN) 1 % cream Apply 1 application topically as needed. For rash in skin folds 30 g 12  . docusate sodium (COLACE) 100 MG capsule Take 1 capsule (100 mg total) by mouth 2 (two) times daily. 60 capsule 12  . DULoxetine (CYMBALTA) 60 MG capsule Take 1 capsule (60 mg total) by mouth daily. 30 capsule 12  . HYDROcodone-acetaminophen (NORCO/VICODIN) 5-325 MG tablet Take 1 tablet by mouth every 6 (six) hours as needed for moderate pain.    Marland Kitchen lamoTRIgine (LAMICTAL) 150 MG tablet Take 2 tablets (300 mg total) by mouth 2  (two) times daily. 60 tablet 12  . letrozole (FEMARA) 2.5 MG tablet Take 1 tablet (2.5 mg total) by mouth daily. 30 tablet 12  . loratadine (CLARITIN) 10 MG tablet Take 1 tablet (10 mg total) by mouth daily. 30 tablet 12  . LORazepam (ATIVAN) 1 MG tablet Take 1 tablet (1 mg total) by mouth every 8 (eight) hours as needed (take one tablet by mouth as needed for seizure lasting longer than 3 min.  or more than 2 seizures in 2 hours). 25 tablet 0  . losartan (COZAAR) 100 MG tablet Take 1 tablet (100 mg total) by mouth daily. 30 tablet 12  . pantoprazole (PROTONIX) 40 MG tablet Take 1 tablet (40 mg total) by mouth 2 (two) times daily. 60 tablet 12  . terbinafine (LAMISIL) 1 % cream Use 2 times a week as needed 30 g 2   No current facility-administered medications for this visit.     Review of Systems Review of Systems  Constitutional: Negative.   Cardiovascular: Negative.     Blood pressure 126/64, pulse 68, resp. rate 12, height 5' (1.524 m), weight 123 lb (55.8 kg).  Physical Exam Physical Exam  Constitutional: She is oriented to person, place, and time. She appears well-developed and well-nourished.  Eyes: Conjunctivae are normal. No scleral icterus.  Neck: Neck supple.  Cardiovascular: Normal rate, regular rhythm and normal heart sounds.   Pulmonary/Chest: Effort normal and breath sounds normal. Right breast exhibits no inverted nipple, no mass, no nipple discharge, no skin change and no tenderness. Left breast exhibits no inverted nipple, no mass, no nipple discharge, no skin change and no tenderness.    Lymphadenopathy:    She has no axillary adenopathy.       Left: No supraclavicular adenopathy present.  Neurological: She is alert and oriented to person, place, and time.  Skin: Skin is warm and dry.    Data Reviewed Bilateral mammograms dated 01/19/2016 were reviewed. BI-RADS-2.  Assessment    No evidence of recurrent cancer.  Good tolerance of antiestrogen therapy.     Plan    No history of colon cancer screening. Unlikely able to tolerate a colonoscopy prep.  We'll see if we can arrange for Cologuard testing.  Patient to return in one year with bilateral diagnostic mammogram. .    This information has been scribed by Rebeca Morris,CMA.    Robert Bellow 01/26/2016, 6:45 PM

## 2016-01-25 NOTE — Patient Instructions (Addendum)
The patient is aware to call back for any questions or concerns. Continue to do self breast exams. Call for any new breast issues or concerns.

## 2016-01-25 NOTE — Telephone Encounter (Signed)
Message left for the patient's mother to call back to give conscent for her daughter to be seen today.

## 2016-01-25 NOTE — Telephone Encounter (Signed)
The patient's mother called back and gave verbal consent for her to be seen.

## 2016-01-26 ENCOUNTER — Encounter: Payer: Self-pay | Admitting: General Surgery

## 2016-01-30 ENCOUNTER — Telehealth: Payer: Self-pay | Admitting: *Deleted

## 2016-01-30 NOTE — Telephone Encounter (Signed)
I talked with caregiver [name] regarding an order for cologuard. She states that they have tried collecting a stool sample in the past and have been unsuccessful. Dr Vira Agar has wanted to order this in the past as well. She does not have a consistent BM routine and her cognitive level does not allow her to tell them when she is going to have a BM. [name] has also talked with Helen Patton's mother and she is wishes to not proceed with the stool testing.

## 2016-01-30 NOTE — Telephone Encounter (Signed)
-----   Message from Robert Bellow, MD sent at 01/26/2016  6:49 PM EST ----- See if we can get the patient approved for a Cologuard test. Thank you

## 2016-02-16 ENCOUNTER — Telehealth: Payer: Self-pay

## 2016-02-16 NOTE — Telephone Encounter (Signed)
error 

## 2016-03-13 ENCOUNTER — Telehealth: Payer: Self-pay | Admitting: Family Medicine

## 2016-03-13 NOTE — Telephone Encounter (Signed)
Helen Patton is reguesting a DC for Rx HYDROcodone-acetaminophen (NORCO/VICODIN) 5-325 MG tablet. Helen Patton states pt is no longer taking this.   Please fax this to [phone]

## 2016-03-13 NOTE — Telephone Encounter (Signed)
Okay to DC

## 2016-03-13 NOTE — Telephone Encounter (Signed)
Order faxed to [phone].

## 2016-03-19 DIAGNOSIS — F339 Major depressive disorder, recurrent, unspecified: Secondary | ICD-10-CM | POA: Diagnosis not present

## 2016-04-03 ENCOUNTER — Ambulatory Visit: Payer: Self-pay | Admitting: Family Medicine

## 2016-04-12 ENCOUNTER — Encounter: Payer: Self-pay | Admitting: Family Medicine

## 2016-04-12 ENCOUNTER — Ambulatory Visit (INDEPENDENT_AMBULATORY_CARE_PROVIDER_SITE_OTHER): Payer: Medicare Other | Admitting: Family Medicine

## 2016-04-12 VITALS — BP 102/56 | HR 84 | Temp 98.2°F | Wt 123.8 lb

## 2016-04-12 DIAGNOSIS — L089 Local infection of the skin and subcutaneous tissue, unspecified: Secondary | ICD-10-CM | POA: Diagnosis not present

## 2016-04-12 DIAGNOSIS — L723 Sebaceous cyst: Secondary | ICD-10-CM

## 2016-04-12 MED ORDER — DOXYCYCLINE HYCLATE 100 MG PO TABS: 100.0000 mg | ORAL_TABLET | Freq: Two times a day (BID) | ORAL | 0 refills | Status: DC

## 2016-04-12 NOTE — Progress Notes (Signed)
Patient: Helen Patton Female    DOB: 04/23/1960   56 y.o.   MRN: [id number] Visit Date: 04/12/2016  Today's Provider: Vernie Murders, PA   Chief Complaint  Patient presents with  . Cyst   Subjective:    HPI Patient presents today with a cyst under chin on her neck. Care taker noticed area a couple days ago. The area was bruised before cyst appeared. Today area is not red or bruised. Patient does not complain of pain.    Patient Active Problem List   Diagnosis Date Noted  . S/P total hip arthroplasty 08/23/2015  . Acute respiratory failure with hypoxia ([place]) 05/19/2015  . Avascular necrosis of femoral head ([place]) 03/07/2015  . Closed fracture of neck of right femur ([place]) 03/07/2015  . Anxiety 12/14/2014  . Arthralgia of hip 12/14/2014  . Athlete's foot 12/14/2014  . Cerebral palsy ([place]) 12/14/2014  . Cognitive decline 12/14/2014  . Clinical depression 12/14/2014  . Acid reflux 12/14/2014  . HLD (hyperlipidemia) 12/14/2014  . BP (high blood pressure) 12/14/2014  . Methicillin resistant Staphylococcus aureus infection 12/14/2014  . Adiposity 12/14/2014  . Breast cancer ([place]) 11/21/2013  . Mental retardation 11/16/2013  . Seizure ([place]) 09/30/2013   Past Surgical History:  Procedure Laterality Date  . BREAST SURGERY Left 12-07-13   T1b, Nx; ER+, PR+, her 2 pending. (Not candidate for chemotherapy)  . broken leg    . CERVICAL ABLATION    . 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 dislocated hip    . HARDWARE REMOVAL  08/23/2015   Procedure: HARDWARE REMOVAL;  Surgeon: Dereck Leep, MD;  Location: [facility] ORS;  Service: Orthopedics;;  . TOTAL HIP REVISION Right 08/23/2015   Procedure: TOTAL HIP REVISION;  Surgeon: Dereck Leep, MD;  Location: [facility] ORS;  Service: Orthopedics;  Laterality: Right;   Family History  Problem Relation Age of Onset  . Diabetes Father    . Parkinson's disease Father   . Neuropathy Mother   . Arthritis Mother   . Diabetes Mother   . Autoimmune disease Brother     AIDS  . Cancer Maternal Aunt 63    breast  . Breast cancer Maternal Aunt   . Hypertension Maternal Grandmother   . Cancer Paternal Grandmother    Allergies  Allergen Reactions  . Sulfa Antibiotics Other (See Comments)    "Does not know"  . Keflex [Cephalexin] Other (See Comments)    "Does not know" unknown     Previous Medications   ALENDRONATE (FOSAMAX) 70 MG TABLET    1 TAB PO WEEKLY EARLY AM BEFORE FOOD/MEDS W/WATER. DON'T LIE DOWN FOR30 MIN (OSTEOPOROSIS) *NO CRUSH*   AMLODIPINE (NORVASC) 10 MG TABLET    Take 1 tablet (10 mg total) by mouth at bedtime.   BENZOYL PEROXIDE (BENZOYL PEROXIDE) 5 % EXTERNAL LIQUID    Apply topically at bedtime.   CALCIUM-VITAMIN D (OSCAL WITH D) 500-200 MG-UNIT TABLET    Take 1 tablet by mouth daily with breakfast.   CLINDAMYCIN (CLEOCIN T) 1 % EXTERNAL SOLUTION    Apply topically daily.   CLOTRIMAZOLE (LOTRIMIN) 1 % CREAM    Apply 1 application topically as needed. For rash in skin folds   DOCUSATE SODIUM (COLACE) 100 MG CAPSULE    Take 1 capsule (100 mg total) by mouth 2 (two) times daily.   DULOXETINE (CYMBALTA) 60  MG CAPSULE    Take 1 capsule (60 mg total) by mouth daily.   HYDROCODONE-ACETAMINOPHEN (NORCO/VICODIN) 5-325 MG TABLET    Take 1 tablet by mouth every 6 (six) hours as needed for moderate pain.   LAMOTRIGINE (LAMICTAL) 150 MG TABLET    Take 2 tablets (300 mg total) by mouth 2 (two) times daily.   LETROZOLE (FEMARA) 2.5 MG TABLET    Take 1 tablet (2.5 mg total) by mouth daily.   LORATADINE (CLARITIN) 10 MG TABLET    Take 1 tablet (10 mg total) by mouth daily.   LORAZEPAM (ATIVAN) 1 MG TABLET    Take 1 tablet (1 mg total) by mouth every 8 (eight) hours as needed (take one tablet by mouth as needed for seizure lasting longer than 3 min.  or more than 2 seizures in 2 hours).   LOSARTAN (COZAAR) 100 MG TABLET     Take 1 tablet (100 mg total) by mouth daily.   PANTOPRAZOLE (PROTONIX) 40 MG TABLET    Take 1 tablet (40 mg total) by mouth 2 (two) times daily.   TERBINAFINE (LAMISIL) 1 % CREAM    Use 2 times a week as needed    Review of Systems  Constitutional: Negative.   Respiratory: Negative.   Cardiovascular: Negative.   Skin:       Skin under chin on neck     Social History  Substance Use Topics  . Smoking status: Never Smoker  . Smokeless tobacco: Never Used  . Alcohol use No   Objective:   BP (!) 102/56 (BP Location: Right Arm, Patient Position: Sitting, Cuff Size: Normal)   Pulse 84   Temp 98.2 F (36.8 C) (Oral)   Wt 123 lb 12.8 oz (56.2 kg)   SpO2 99%   BMI 24.18 kg/m   Physical Exam  Constitutional: She appears well-developed and well-nourished. No distress.  HENT:  Head: Normocephalic and atraumatic.  Right Ear: Hearing normal.  Left Ear: Hearing normal.  Nose: Nose normal.  Eyes: Conjunctivae and lids are normal. Right eye exhibits no discharge. Left eye exhibits no discharge. No scleral icterus.  Pulmonary/Chest: Effort normal. No respiratory distress.  Musculoskeletal: Normal range of motion.  Skin: Skin is intact. No lesion noted.  Red raised 1.5 cm cystic lesion on the anterior neck near the chin. No local lymphadenopathy.  Psychiatric: Her speech is normal.      Assessment & Plan:     1. Infected sebaceous cyst of skin Onset over the past week. Soft with no drainage or pain. No local lymphadenopathy. May apply moist warm compresses and treat with antibiotic. If no better in 5-6 days, should consider referral to a surgeon ("good relationship with Dr. Bary Castilla who did her breast cancer work"). - doxycycline (VIBRA-TABS) 100 MG tablet; Take 1 tablet (100 mg total) by mouth 2 (two) times daily.  Dispense: 14 tablet; Refill: 0

## 2016-04-12 NOTE — Patient Instructions (Signed)
Epidermal Cyst An epidermal cyst is sometimes called an epidermal inclusion cyst or an infundibular cyst. It is a sac made of skin tissue. The sac contains a substance called keratin. Keratin is a protein that is normally secreted through the hair follicles. When keratin becomes trapped in the top layer of skin (epidermis), it can form an epidermal cyst. Epidermal cysts are usually found on the face, neck, trunk, and genitals. These cysts are usually harmless (benign), and they may not cause symptoms unless they become infected. It is important not to pop epidermal cysts yourself. What are the causes? This condition may be caused by:  A blocked hair follicle.  A hair that curls and re-enters the skin instead of growing straight out of the skin (ingrown hair).  A blocked pore.  Irritated skin.  An injury to the skin.  Certain conditions that are passed along from parent to child (inherited).  Human papillomavirus (HPV). What increases the risk? The following factors may make you more likely to develop an epidermal cyst:  Having acne.  Being overweight.  Wearing tight clothing. What are the signs or symptoms? The only symptom of this condition may be a small, painless lump underneath the skin. When an epidermal cyst becomes infected, symptoms may include:  Redness.  Inflammation.  Tenderness.  Warmth.  Fever.  Keratin draining from the cyst. Keratin may look like a grayish-white, bad-smelling substance.  Pus draining from the cyst. How is this diagnosed? This condition is diagnosed with a physical exam. In some cases, you may have a sample of tissue (biopsy) taken from your cyst to be examined under a microscope or tested for bacteria. You may be referred to a health care provider who specializes in skin care (dermatologist). How is this treated? In many cases, epidermal cysts go away on their own without treatment. If a cyst becomes infected, treatment may  include:  Opening and draining the cyst. After draining, minor surgery to remove the rest of the cyst may be done.  Antibiotic medicine to help prevent infection.  Injections of medicines (steroids) that help to reduce inflammation.  Surgery to remove the cyst. Surgery may be done if:  The cyst becomes large.  The cyst bothers you.  There is a chance that the cyst could turn into cancer. Follow these instructions at home:  Take over-the-counter and prescription medicines only as told by your health care provider.  If you were prescribed an antibiotic, use it as told by your health care provider. Do not stop using the antibiotic even if you start to feel better.  Keep the area around your cyst clean and dry.  Wear loose, dry clothing.  Do not try to pop your cyst.  Avoid touching your cyst.  Check your cyst every day for signs of infection.  Keep all follow-up visits as told by your health care provider. This is important. How is this prevented?  Wear clean, dry, clothing.  Avoid wearing tight clothing.  Keep your skin clean and dry. Shower or take baths every day.  Wash your body with a benzoyl peroxide wash when you shower or bathe. Contact a health care provider if:  Your cyst develops symptoms of infection.  Your condition is not improving or is getting worse.  You develop a cyst that looks different from other cysts you have had.  You have a fever. Get help right away if:  Redness spreads from the cyst into the surrounding area. This information is not intended to replace  if:  · Your cyst develops symptoms of infection.  · Your condition is not improving or is getting worse.  · You develop a cyst that looks different from other cysts you have had.  · You have a fever.  Get help right away if:  · Redness spreads from the cyst into the surrounding area.  This information is not intended to replace advice given to you by your health care provider. Make sure you discuss any questions you have with your health care provider.  Document Released: 12/30/2003 Document Revised: 09/27/2015 Document Reviewed: 11/30/2014  Elsevier Interactive Patient Education © 2017 Elsevier Inc.

## 2016-04-16 DIAGNOSIS — E559 Vitamin D deficiency, unspecified: Secondary | ICD-10-CM | POA: Diagnosis not present

## 2016-04-16 DIAGNOSIS — Z79899 Other long term (current) drug therapy: Secondary | ICD-10-CM | POA: Diagnosis not present

## 2016-04-16 DIAGNOSIS — R2689 Other abnormalities of gait and mobility: Secondary | ICD-10-CM | POA: Diagnosis not present

## 2016-04-16 DIAGNOSIS — R569 Unspecified convulsions: Secondary | ICD-10-CM | POA: Diagnosis not present

## 2016-04-16 DIAGNOSIS — F411 Generalized anxiety disorder: Secondary | ICD-10-CM | POA: Diagnosis not present

## 2016-04-16 DIAGNOSIS — E538 Deficiency of other specified B group vitamins: Secondary | ICD-10-CM | POA: Diagnosis not present

## 2016-04-17 ENCOUNTER — Telehealth: Payer: Self-pay

## 2016-04-17 MED ORDER — VITAMIN D 50 MCG (2000 UT) PO TABS: ORAL_TABLET | ORAL | 12 refills | Status: DC

## 2016-04-17 NOTE — Telephone Encounter (Signed)
RX sent in-aa 

## 2016-04-17 NOTE — Telephone Encounter (Signed)
Refill request received from [facility] requesting VITAMIN D 2000IU TAB- Take 2 tablets once daily.

## 2016-04-22 ENCOUNTER — Other Ambulatory Visit: Payer: Self-pay | Admitting: Family Medicine

## 2016-04-30 ENCOUNTER — Telehealth: Payer: Self-pay | Admitting: Emergency Medicine

## 2016-04-30 NOTE — Telephone Encounter (Signed)
Per [facility] pt has low Vitamin B 12 and will need B12 injections. She will get them once a week for 4 weeks then monthly after that. Spoke with [place] at group home and they will bring pt for her 4 weekly injections then she will get rx for nurse to give them in the home every month. She is requesting a "order" be faxed to them about the instructions. Order written, awaiting MD signature.

## 2016-05-01 ENCOUNTER — Telehealth: Payer: Self-pay | Admitting: Family Medicine

## 2016-05-01 ENCOUNTER — Other Ambulatory Visit: Payer: Self-pay

## 2016-05-01 ENCOUNTER — Ambulatory Visit: Payer: Self-pay | Admitting: Family Medicine

## 2016-05-01 MED ORDER — VITAMIN D 50 MCG (2000 UT) PO TABS: ORAL_TABLET | ORAL | 12 refills | Status: DC

## 2016-05-01 NOTE — Telephone Encounter (Signed)
Dr Rosanna Randy which dose you want patient to take-aa

## 2016-05-01 NOTE — Telephone Encounter (Signed)
2k daily new dose.

## 2016-05-01 NOTE — Telephone Encounter (Signed)
Spoke to the pharmacist and advised them of the dose patient to take. I re sent the RX for the 2000 u dose due to they did not receive it on 04/22/16-aa

## 2016-05-01 NOTE — Telephone Encounter (Signed)
[place] with [facility] request a call back for clarification on Cholecalciferol (VITAMIN D) 2000 units tablet. Harrington Challenger stated pt was taking 4,000 units a day and now the new Rx is 2,000 units a day. Please advise. Thanks TNP

## 2016-05-03 ENCOUNTER — Ambulatory Visit (INDEPENDENT_AMBULATORY_CARE_PROVIDER_SITE_OTHER): Payer: Medicare Other

## 2016-05-03 DIAGNOSIS — E538 Deficiency of other specified B group vitamins: Secondary | ICD-10-CM | POA: Diagnosis not present

## 2016-05-03 MED ORDER — CYANOCOBALAMIN 1000 MCG/ML IJ SOLN
1000.0000 ug | Freq: Once | INTRAMUSCULAR | Status: AC
  Administered 2016-05-03: 1000 ug via INTRAMUSCULAR

## 2016-05-10 ENCOUNTER — Ambulatory Visit (INDEPENDENT_AMBULATORY_CARE_PROVIDER_SITE_OTHER): Payer: Medicare Other | Admitting: *Deleted

## 2016-05-10 ENCOUNTER — Telehealth: Payer: Self-pay | Admitting: *Deleted

## 2016-05-10 DIAGNOSIS — L089 Local infection of the skin and subcutaneous tissue, unspecified: Secondary | ICD-10-CM

## 2016-05-10 DIAGNOSIS — E538 Deficiency of other specified B group vitamins: Secondary | ICD-10-CM

## 2016-05-10 DIAGNOSIS — L723 Sebaceous cyst: Principal | ICD-10-CM

## 2016-05-10 MED ORDER — DOXYCYCLINE HYCLATE 100 MG PO TABS: 100.0000 mg | ORAL_TABLET | Freq: Two times a day (BID) | ORAL | 0 refills | Status: DC

## 2016-05-10 MED ORDER — CYANOCOBALAMIN 1000 MCG/ML IJ SOLN
1000.0000 ug | Freq: Once | INTRAMUSCULAR | Status: AC
  Administered 2016-05-10: 1000 ug via INTRAMUSCULAR

## 2016-05-10 NOTE — Telephone Encounter (Signed)
RX sent to pharmacy.   Thanks,   -Mickel Baas

## 2016-05-10 NOTE — Telephone Encounter (Signed)
Doxycycline 100mg BID for 1 week. 

## 2016-05-10 NOTE — Telephone Encounter (Signed)
Please review. Thanks!  

## 2016-05-10 NOTE — Telephone Encounter (Signed)
Patient was in office for PT check today. Nurse that brought patient in wanted to know if Dr. Rosanna Randy will prescribe a cream for her. Patient has been scratching at her chest and has sores. Patient was recently treated for MRSA, that was located under her chin. Please advise?

## 2016-05-14 IMAGING — MG MM DIGITAL DIAGNOSTIC BILAT W/ CAD
6 series · 6 of 6 positions shown · non-contrast
Comparison: 10/29/2012, 10/13/2012, 10/10/2011, 06/11/2007

CLINICAL DATA: 53-year-old female, for follow-up of left breast
asymmetry and yearly exam.

EXAM:
DIGITAL DIAGNOSTIC  BILATERAL MAMMOGRAM WITH CAD
ULTRASOUND LEFT BREAST

[L CC]
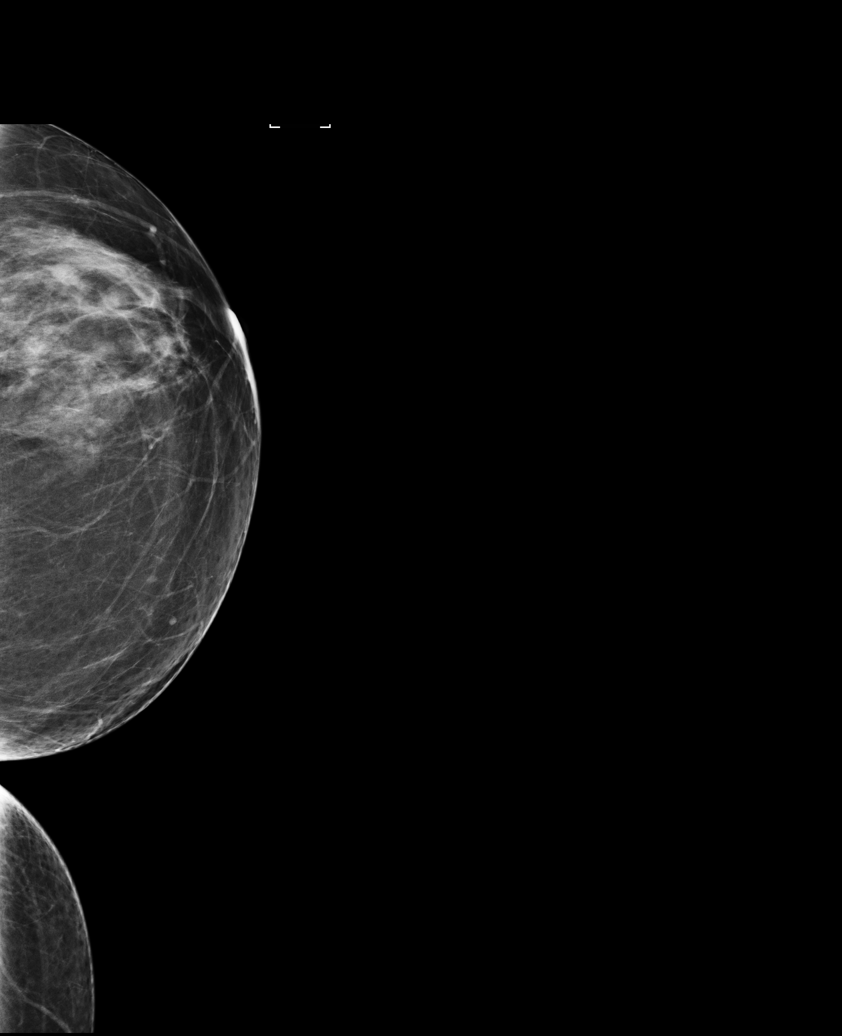

[R CC]
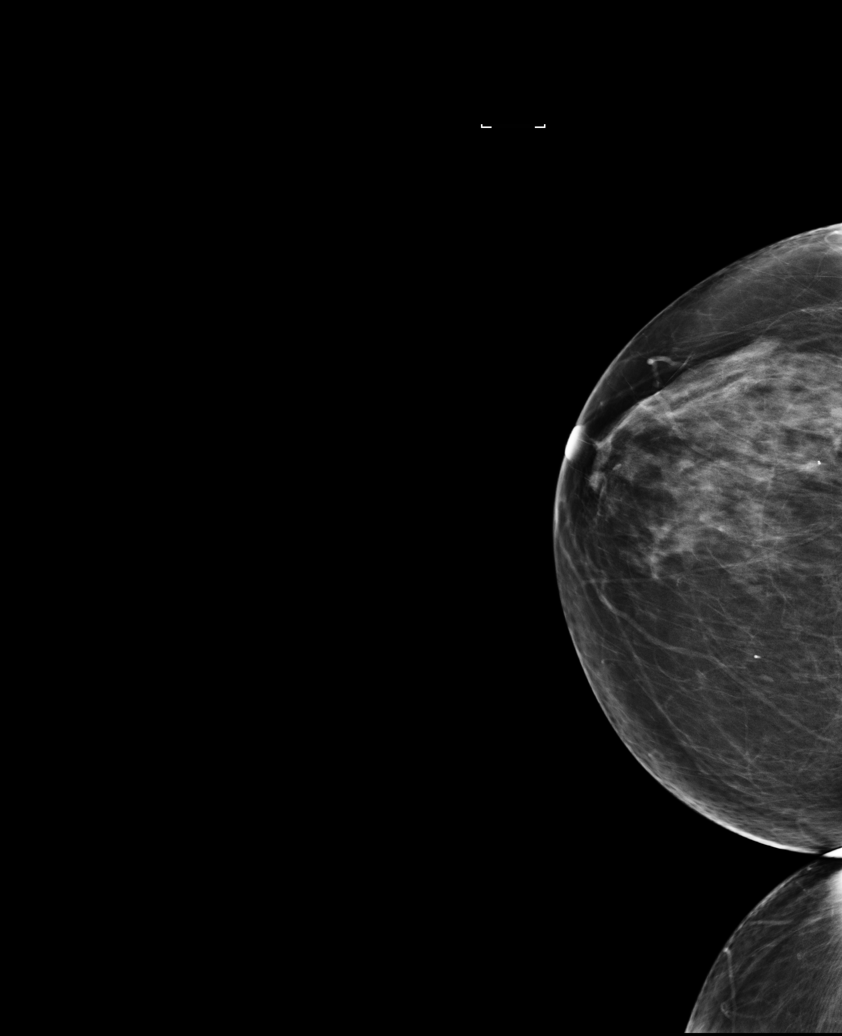

[R MLO]
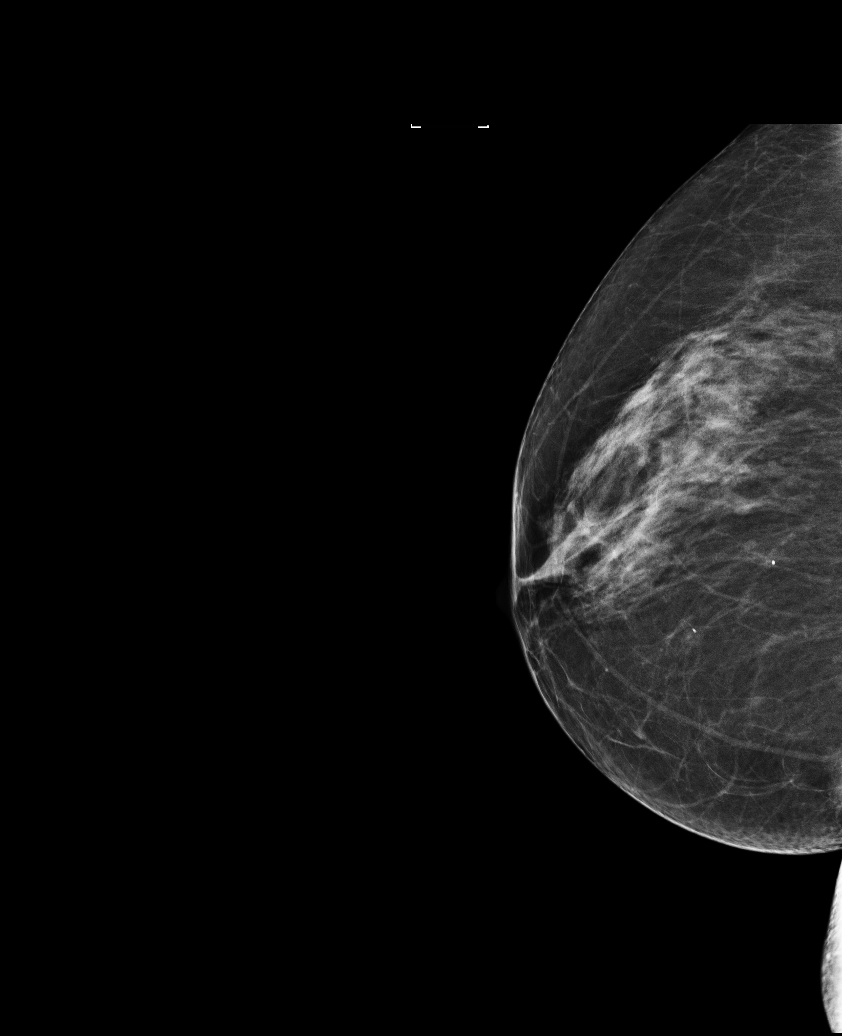

[L XCCL]
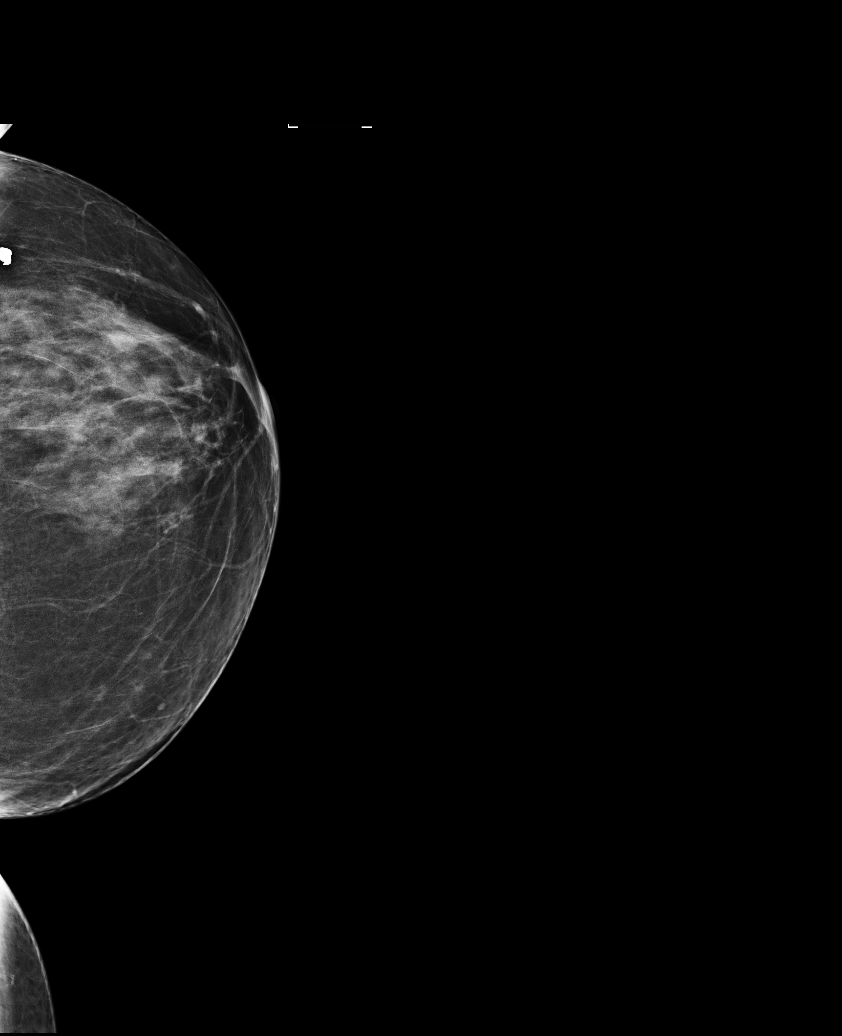

[L MLO (1 of 2)]
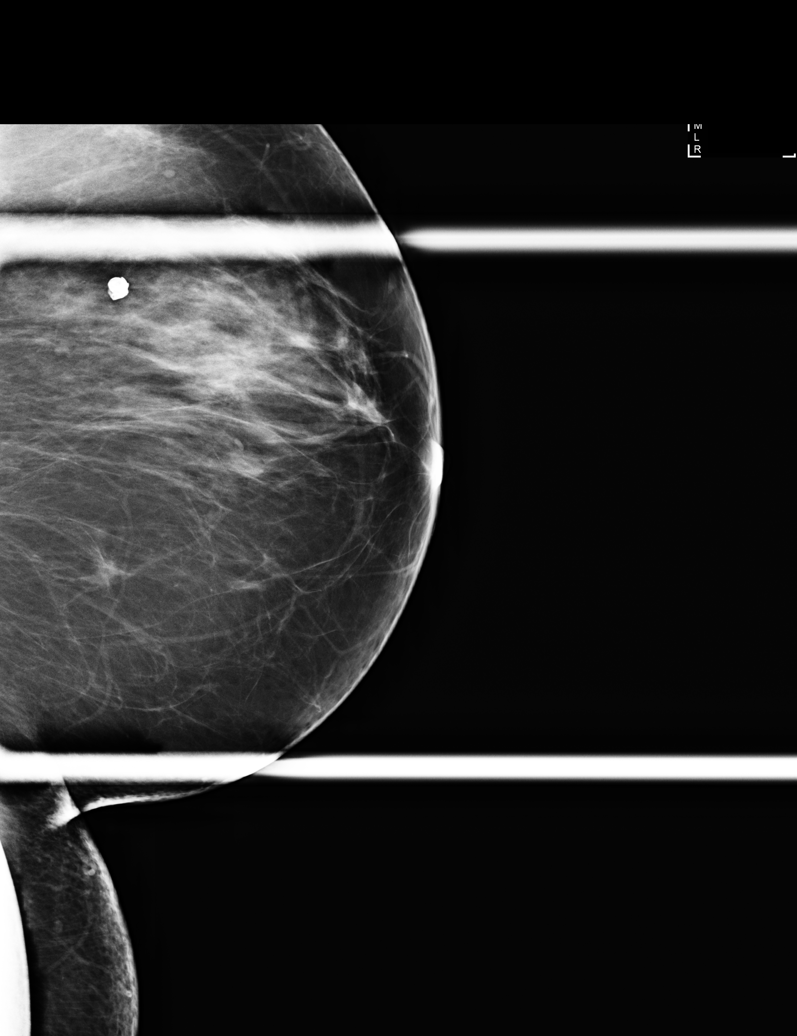

[L MLO (2 of 2)]
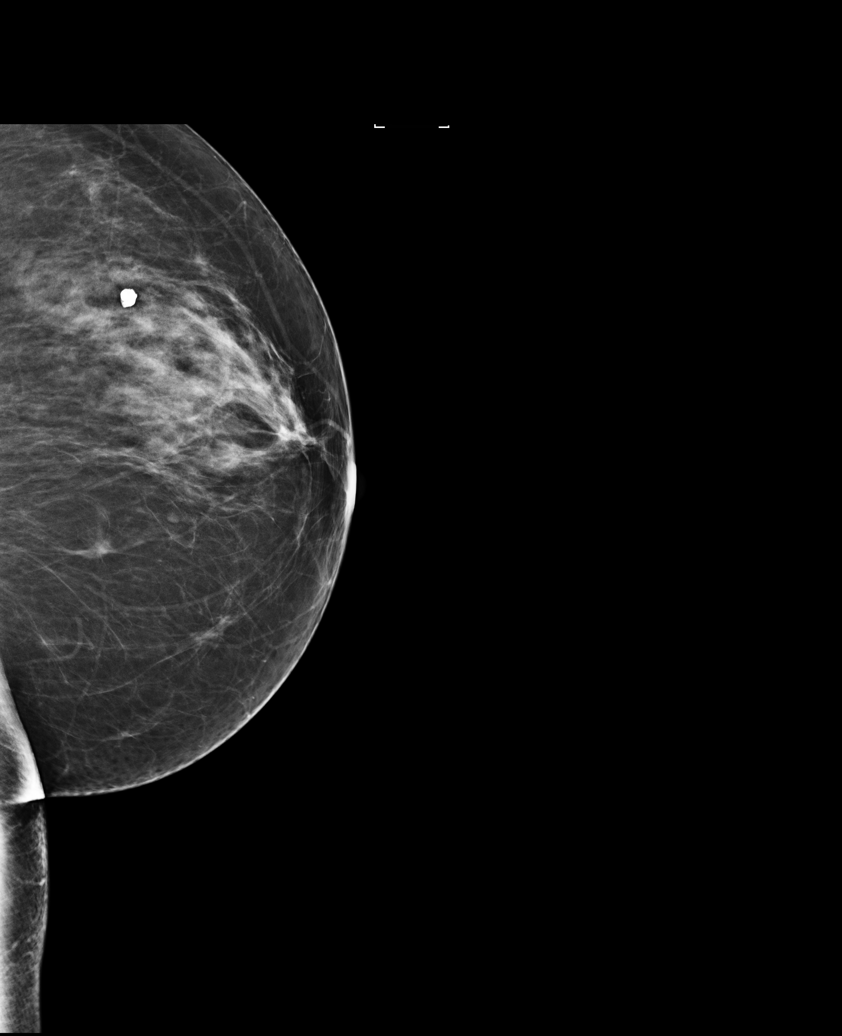

[6 of 6 positions shown; findings below may reference images not displayed]

ACR Breast Density Category c: The breast tissue is heterogeneously
dense, which may obscure small masses.
FINDINGS: Limited exam secondary to difficulty in patient positioning. The
patient is mentally challenged. The best possible images were
obtained.

The previously noted asymmetry within the inferior left breast on
the MLO view appears similar to slightly more prominent than on
prior study. No suspicious mass, calcifications, or other
abnormality is identified within the right breast. The images are
limited by patient motion and positioning.

Mammographic images were processed with CAD.

Targeted ultrasound of the inferior left breast was performed. An
irregular hypoechoic mass is noted at 6 o'clock, 3 cm from the
nipple measuring 6 x 3 x 5 mm. This is thought to correspond to the
mammographic asymmetry and appears slightly larger when compared to
prior ultrasound. The previously seen oval, hypoechoic masses at 4
o'clock, 4 cm from the nipple and 5 o'clock 5 cm from the nipple are
not seen on today's ultrasound. There is an oval, circumscribed,
hypoechoic mass with increased through transmission at 4 o'clock, 3
cm from the nipple measuring 7 x 5 x 3 mm. This likely represents a
complicated cyst.
IMPRESSION: 1. Indeterminate left breast mass at 6 o'clock, 3 cm from the
nipple.
2. Probably benign complicated cyst at 4 o'clock, 3 cm from the
nipple.

RECOMMENDATION:
1. Ultrasound-guided core biopsy of the left breast mass at 6
o'clock.
2. Aspiration of the probably benign complicated cyst at 4 o'clock
is recommended at the time of biopsy of the other mass.
I have discussed the findings and recommendations with the patient
and her caregiver. The patient is mentally challenged. Results were
also provided in writing at the conclusion of the visit. If
applicable, a reminder letter will be sent to the patient regarding
the next appointment.

BI-RADS CATEGORY  4: Suspicious.

## 2016-05-16 DIAGNOSIS — Z96641 Presence of right artificial hip joint: Secondary | ICD-10-CM | POA: Diagnosis not present

## 2016-05-17 ENCOUNTER — Ambulatory Visit (INDEPENDENT_AMBULATORY_CARE_PROVIDER_SITE_OTHER): Payer: Medicare Other

## 2016-05-17 DIAGNOSIS — E538 Deficiency of other specified B group vitamins: Secondary | ICD-10-CM | POA: Insufficient documentation

## 2016-05-17 MED ORDER — CYANOCOBALAMIN 1000 MCG/ML IJ SOLN
1000.0000 ug | Freq: Once | INTRAMUSCULAR | Status: AC
  Administered 2016-05-17: 1000 ug via INTRAMUSCULAR

## 2016-05-24 ENCOUNTER — Telehealth: Payer: Self-pay | Admitting: Emergency Medicine

## 2016-05-24 ENCOUNTER — Ambulatory Visit (INDEPENDENT_AMBULATORY_CARE_PROVIDER_SITE_OTHER): Payer: Medicare Other | Admitting: Emergency Medicine

## 2016-05-24 DIAGNOSIS — E538 Deficiency of other specified B group vitamins: Secondary | ICD-10-CM

## 2016-05-24 MED ORDER — CYANOCOBALAMIN 1000 MCG/ML IJ SOLN
1000.0000 ug | Freq: Once | INTRAMUSCULAR | Status: AC
  Administered 2016-05-24: 1000 ug via INTRAMUSCULAR

## 2016-05-24 NOTE — Addendum Note (Signed)
Addended by: Ermalinda Barrios on: 05/24/2016 09:44 AM   Modules accepted: Level of Service

## 2016-05-24 NOTE — Telephone Encounter (Signed)
Pt caregiver needs orders for B12 injection in home once monthly. She completed her 4 weekly injection today. Please advise. Thanks

## 2016-05-24 NOTE — Progress Notes (Signed)
Pt will be getting injection at her home by caregiver monthly now.

## 2016-05-29 ENCOUNTER — Telehealth: Payer: Self-pay | Admitting: Family Medicine

## 2016-05-29 NOTE — Telephone Encounter (Signed)
Called Pt to schedule AWV with [facility] - knb °

## 2016-05-30 DIAGNOSIS — L299 Pruritus, unspecified: Secondary | ICD-10-CM | POA: Diagnosis not present

## 2016-06-04 ENCOUNTER — Ambulatory Visit (INDEPENDENT_AMBULATORY_CARE_PROVIDER_SITE_OTHER): Payer: Medicare Other | Admitting: Family Medicine

## 2016-06-04 VITALS — BP 118/58 | HR 82 | Temp 98.3°F | Resp 16 | Wt 123.0 lb

## 2016-06-04 DIAGNOSIS — E538 Deficiency of other specified B group vitamins: Secondary | ICD-10-CM | POA: Diagnosis not present

## 2016-06-04 DIAGNOSIS — L509 Urticaria, unspecified: Secondary | ICD-10-CM | POA: Diagnosis not present

## 2016-06-04 DIAGNOSIS — F79 Unspecified intellectual disabilities: Secondary | ICD-10-CM

## 2016-06-04 NOTE — Progress Notes (Signed)
Helen Patton  MRN: [id number] DOB: 11-07-1960  Subjective:  HPI  Patient is here with caregiver today. Caregiver states patient was giving a bath on 05/30/16 and patient kept complaining of her chest/skin area was bothering her and then the lady that was giving her a bath she felt a knot on patient's chest so patient was taking to [facility]. When patient got there knot was not present anymore. They did treat patient for pruritis and was given RX for Triamcinolone acetonide 0.025% cream to use BID prn but just found out while here at the office that this was not picked up from the pharmacy yet. Patient is scratching her chest and red marks are present. Patient's mother wanted to have patient checked to see if she has pneumonia or any other infection. Patient has not been coughing, no fever.  Patient Active Problem List   Diagnosis Date Noted  . B12 deficiency 05/17/2016  . S/P total hip arthroplasty 08/23/2015  . Acute respiratory failure with hypoxia ([place]) 05/19/2015  . Avascular necrosis of femoral head ([place]) 03/07/2015  . Closed fracture of neck of right femur ([place]) 03/07/2015  . Anxiety 12/14/2014  . Arthralgia of hip 12/14/2014  . Athlete's foot 12/14/2014  . Cerebral palsy ([place]) 12/14/2014  . Cognitive decline 12/14/2014  . Clinical depression 12/14/2014  . Acid reflux 12/14/2014  . HLD (hyperlipidemia) 12/14/2014  . BP (high blood pressure) 12/14/2014  . Methicillin resistant Staphylococcus aureus infection 12/14/2014  . Adiposity 12/14/2014  . Breast cancer ([place]) 11/21/2013  . Mental retardation 11/16/2013  . Seizure ([place]) 09/30/2013    Past Medical History:  Diagnosis Date  . Anxiety   . Arthritis    osteoarthritis  . Breast cancer ([place]) 2015   [name]  . Cancer ([place]) 11/2013   T1c, Nx; ER/ PR +; Her 2 neg invasive mammary carcinoma left breast. Wide excision only based on Tumor Board review.   . Depression   . Difficulty swallowing   . Falls   .  GERD (gastroesophageal reflux disease)   . Hidradenitis 2012   MRSA, on chronic topical suppression  . History of hiatal hernia   . Hyperlipidemia   . Hypertension   . Mental retardation   . MRSA (methicillin resistant staph aureus) culture positive 2012  . Osteoporosis   . Pneumonia    aspiration pneumonia  . Seizures [facility])    last seizure February 2017    Social History   Social History  . Marital status: Single    Spouse name: N/A  . Number of children: N/A  . Years of education: N/A   Occupational History  . Not on file.   Social History Main Topics  . Smoking status: Never Smoker  . Smokeless tobacco: Never Used  . Alcohol use No  . Drug use: No  . Sexual activity: No   Other Topics Concern  . Not on file   Social History Narrative  . No narrative on file    Outpatient Encounter Prescriptions as of 06/04/2016  Medication Sig Note  . acetaminophen (TYLENOL) 325 MG tablet Take 650 mg by mouth every 6 (six) hours as needed.   Marland Kitchen alendronate (FOSAMAX) 70 MG tablet 1 TAB PO WEEKLY EARLY AM BEFORE FOOD/MEDS W/WATER. DON'T LIE DOWN FOR30 MIN (OSTEOPOROSIS) *NO CRUSH*   . amLODipine (NORVASC) 10 MG tablet Take 1 tablet (10 mg total) by mouth at bedtime.   . benzoyl peroxide (BENZOYL PEROXIDE) 5 % external liquid Apply topically at bedtime.   Marland Kitchen  calcium-vitamin D (OSCAL WITH D) 500-200 MG-UNIT tablet Take 1 tablet by mouth daily with breakfast.   . Cholecalciferol (VITAMIN D) 2000 units tablet 1 PO daily   . clindamycin (CLEOCIN T) 1 % external solution Apply topically daily.   . clotrimazole (LOTRIMIN) 1 % cream Apply 1 application topically as needed. For rash in skin folds   . docusate sodium (COLACE) 100 MG capsule Take 1 capsule (100 mg total) by mouth 2 (two) times daily.   . DULoxetine (CYMBALTA) 30 MG capsule Take 30 mg by mouth daily. 3 tablets daily   . lamoTRIgine (LAMICTAL) 150 MG tablet Take 2 tablets (300 mg total) by mouth 2 (two) times daily.   Marland Kitchen  letrozole (FEMARA) 2.5 MG tablet Take 1 tablet (2.5 mg total) by mouth daily.   Marland Kitchen loratadine (CLARITIN) 10 MG tablet Take 1 tablet (10 mg total) by mouth daily.   Marland Kitchen LORazepam (ATIVAN) 1 MG tablet Take 1 tablet (1 mg total) by mouth every 8 (eight) hours as needed (take one tablet by mouth as needed for seizure lasting longer than 3 min.  or more than 2 seizures in 2 hours).   . losartan (COZAAR) 100 MG tablet Take 1 tablet (100 mg total) by mouth daily.   . pantoprazole (PROTONIX) 40 MG tablet Take 1 tablet (40 mg total) by mouth 2 (two) times daily.   Marland Kitchen terbinafine (LAMISIL) 1 % cream Use 2 times a week as needed   . [DISCONTINUED] doxycycline (VIBRA-TABS) 100 MG tablet Take 1 tablet (100 mg total) by mouth 2 (two) times daily.   . [DISCONTINUED] DULoxetine (CYMBALTA) 60 MG capsule Take 1 capsule (60 mg total) by mouth daily.   . [DISCONTINUED] HYDROcodone-acetaminophen (NORCO/VICODIN) 5-325 MG tablet Take 1 tablet by mouth every 6 (six) hours as needed for moderate pain. 10/04/2015: prn   No facility-administered encounter medications on file as of 06/04/2016.     Allergies  Allergen Reactions  . Sulfa Antibiotics Other (See Comments)    "Does not know"  . Keflex [Cephalexin] Other (See Comments)    "Does not know" unknown    Review of Systems  Constitutional: Negative.   Eyes: Negative.   Respiratory: Negative.   Cardiovascular: Negative.   Gastrointestinal: Negative.   Skin: Positive for itching.  Neurological: Negative.        Speech difficulty-chronic.  Endo/Heme/Allergies: Negative.   Psychiatric/Behavioral: Negative.     Objective:  BP (!) 118/58   Pulse 82   Temp 98.3 F (36.8 C)   Resp 16   Wt 123 lb (55.8 kg)   BMI 24.02 kg/m   Physical Exam  Constitutional: She is oriented to person, place, and time and well-developed, well-nourished, and in no distress.  HENT:  Head: Normocephalic and atraumatic.  Right Ear: External ear normal.  Left Ear: External ear  normal.  Nose: Nose normal.  Eyes: Conjunctivae are normal.  Neck: No thyromegaly present.  Cardiovascular: Normal rate, regular rhythm, normal heart sounds and intact distal pulses.   No murmur heard. Pulmonary/Chest: Effort normal and breath sounds normal. No respiratory distress. She has no wheezes.  Abdominal: Soft.  Neurological: She is alert and oriented to person, place, and time. Gait normal. GCS score is 15.  Skin: Skin is warm and dry.  Across her upper chest urticarial lesions. And her lower arms areas also.  Psychiatric: Mood, memory, affect and judgment normal.   Assessment and Plan :  1. Urticaria Advised caregiver and patient to get Triamcinolone ointment that  was prescribed by [facility] and if this does not resolve the symptoms may need to add benadryl at bedtime. Follow as needed for this.  2. Low vitamin B12 level 3. Mental retardation/Microchephaly HPI, Exam and A&P transcribed by Theressa Millard, RMA under direction and in the presence of Miguel Aschoff, MD. I have done the exam and reviewed the chart and it is accurate to the best of my knowledge. Development worker, community has been used and  any errors in dictation or transcription are unintentional. Miguel Aschoff M.D. [facility]

## 2016-06-19 ENCOUNTER — Telehealth: Payer: Self-pay | Admitting: Family Medicine

## 2016-06-19 NOTE — Telephone Encounter (Signed)
[place] with [name] is requesting written order for a B12 shot with syringe. Fax# [phone].  CB#[phone]

## 2016-06-20 NOTE — Telephone Encounter (Signed)
Order wrote out, waiting for dr Rosanna Randy to sign it and will fax it this morning-aa

## 2016-07-01 ENCOUNTER — Ambulatory Visit (INDEPENDENT_AMBULATORY_CARE_PROVIDER_SITE_OTHER): Payer: Medicare Other | Admitting: Family Medicine

## 2016-07-01 ENCOUNTER — Encounter: Payer: Self-pay | Admitting: Family Medicine

## 2016-07-01 VITALS — BP 120/60 | HR 84 | Temp 98.0°F | Resp 16 | Wt 120.0 lb

## 2016-07-01 DIAGNOSIS — G808 Other cerebral palsy: Secondary | ICD-10-CM | POA: Diagnosis not present

## 2016-07-01 DIAGNOSIS — F79 Unspecified intellectual disabilities: Secondary | ICD-10-CM

## 2016-07-01 DIAGNOSIS — R569 Unspecified convulsions: Secondary | ICD-10-CM | POA: Diagnosis not present

## 2016-07-01 NOTE — Progress Notes (Signed)
Subjective:  HPI Pt and caregiver are here today for FL2 forms for this year to be filled out. Caregiver reports that pt has been doing well and feeling well.   Prior to Admission medications   Medication Sig Start Date End Date Taking? Authorizing Provider  acetaminophen (TYLENOL) 325 MG tablet Take 650 mg by mouth every 6 (six) hours as needed.    [provider]  alendronate (FOSAMAX) 70 MG tablet 1 TAB PO WEEKLY EARLY AM BEFORE FOOD/MEDS W/WATER. DON'T LIE DOWN FOR30 MIN (OSTEOPOROSIS) *NO CRUSH* 11/30/15   Jerrol Banana., MD  amLODipine (NORVASC) 10 MG tablet Take 1 tablet (10 mg total) by mouth at bedtime. 11/30/15   Jerrol Banana., MD  benzoyl peroxide (BENZOYL PEROXIDE) 5 % external liquid Apply topically at bedtime. 11/30/15   Jerrol Banana., MD  calcium-vitamin D (OSCAL WITH D) 500-200 MG-UNIT tablet Take 1 tablet by mouth daily with breakfast. 11/30/15   Jerrol Banana., MD  Cholecalciferol (VITAMIN D) 2000 units tablet 1 PO daily 05/01/16   Jerrol Banana., MD  clindamycin (CLEOCIN T) 1 % external solution Apply topically daily. 11/30/15   Jerrol Banana., MD  clotrimazole (LOTRIMIN) 1 % cream Apply 1 application topically as needed. For rash in skin folds 11/30/15   Jerrol Banana., MD  docusate sodium (COLACE) 100 MG capsule Take 1 capsule (100 mg total) by mouth 2 (two) times daily. 11/30/15   Jerrol Banana., MD  DULoxetine (CYMBALTA) 30 MG capsule Take 30 mg by mouth daily. 3 tablets daily    [provider]  lamoTRIgine (LAMICTAL) 150 MG tablet Take 2 tablets (300 mg total) by mouth 2 (two) times daily. 11/30/15   Jerrol Banana., MD  letrozole [facility]) 2.5 MG tablet Take 1 tablet (2.5 mg total) by mouth daily. 11/30/15   Jerrol Banana., MD  loratadine (CLARITIN) 10 MG tablet Take 1 tablet (10 mg total) by mouth daily. 11/30/15   Jerrol Banana., MD  LORazepam (ATIVAN) 1 MG  tablet Take 1 tablet (1 mg total) by mouth every 8 (eight) hours as needed (take one tablet by mouth as needed for seizure lasting longer than 3 min.  or more than 2 seizures in 2 hours). 11/30/15   Jerrol Banana., MD  losartan (COZAAR) 100 MG tablet Take 1 tablet (100 mg total) by mouth daily. 11/30/15   Jerrol Banana., MD  pantoprazole (PROTONIX) 40 MG tablet Take 1 tablet (40 mg total) by mouth 2 (two) times daily. 11/30/15   Jerrol Banana., MD  terbinafine (LAMISIL) 1 % cream Use 2 times a week as needed 11/30/15   Jerrol Banana., MD  triamcinolone (KENALOG) 0.025 % ointment Apply 1 application topically 2 (two) times daily as needed.    [provider]    Patient Active Problem List   Diagnosis Date Noted  . B12 deficiency 05/17/2016  . S/P total hip arthroplasty 08/23/2015  . Acute respiratory failure with hypoxia ([place]) 05/19/2015  . Avascular necrosis of femoral head ([place]) 03/07/2015  . Closed fracture of neck of right femur ([place]) 03/07/2015  . Anxiety 12/14/2014  . Arthralgia of hip 12/14/2014  . Athlete's foot 12/14/2014  . Cerebral palsy ([place]) 12/14/2014  . Cognitive decline 12/14/2014  . Clinical depression 12/14/2014  . Acid reflux 12/14/2014  . HLD (hyperlipidemia) 12/14/2014  . BP (high blood pressure) 12/14/2014  .  Methicillin resistant Staphylococcus aureus infection 12/14/2014  . Adiposity 12/14/2014  . Breast cancer ([place]) 11/21/2013  . Mental retardation 11/16/2013  . Seizure ([place]) 09/30/2013    Past Medical History:  Diagnosis Date  . Anxiety   . Arthritis    osteoarthritis  . Breast cancer ([place]) 2015   [name]  . Cancer ([place]) 11/2013   T1c, Nx; ER/ PR +; Her 2 neg invasive mammary carcinoma left breast. Wide excision only based on Tumor Board review.   . Depression   . Difficulty swallowing   . Falls   . GERD (gastroesophageal reflux disease)   . Hidradenitis 2012   MRSA, on chronic topical  suppression  . History of hiatal hernia   . Hyperlipidemia   . Hypertension   . Mental retardation   . MRSA (methicillin resistant staph aureus) culture positive 2012  . Osteoporosis   . Pneumonia    aspiration pneumonia  . Seizures [facility])    last seizure February 2017    Social History   Social History  . Marital status: Single    Spouse name: N/A  . Number of children: N/A  . Years of education: N/A   Occupational History  . Not on file.   Social History Main Topics  . Smoking status: Never Smoker  . Smokeless tobacco: Never Used  . Alcohol use No  . Drug use: No  . Sexual activity: No   Other Topics Concern  . Not on file   Social History Narrative  . No narrative on file    Allergies  Allergen Reactions  . Sulfa Antibiotics Other (See Comments)    "Does not know"  . Keflex [Cephalexin] Other (See Comments)    "Does not know" unknown    Review of Systems  Constitutional: Negative.   HENT: Negative.   Eyes: Negative.   Respiratory: Negative.   Cardiovascular: Negative.   Gastrointestinal: Negative.   Genitourinary: Negative.   Musculoskeletal: Negative.   Skin: Negative.   Neurological: Negative.   Endo/Heme/Allergies: Negative.   Psychiatric/Behavioral: Negative.        Pt has cerebral palsy and mental retardation     Immunization History  Administered Date(s) Administered  . Influenza Split 12/18/2004, 11/28/2011  . Influenza,inj,Quad PF,36+ Mos 12/30/2012, 12/18/2013, 10/29/2014, 12/09/2015    Objective:  BP 120/60 (BP Location: Left Arm, Patient Position: Sitting, Cuff Size: Normal)   Pulse 84   Temp 98 F (36.7 C) (Oral)   Resp 16   Wt 120 lb (54.4 kg)   SpO2 96%   BMI 23.44 kg/m   Physical Exam  Lab Results  Component Value Date   WBC 15.3 (H) 08/25/2015   HGB 9.8 (L) 08/25/2015   HCT 28.7 (L) 08/25/2015   PLT 316 08/25/2015   GLUCOSE 209 (H) 08/25/2015   TSH 2.55 12/28/2011   INR 0.85 08/09/2015    CMP     Component  Value Date/Time   NA 141 08/25/2015 0901   NA 146 06/17/2013   NA 136 01/08/2013 1742   K 3.7 08/25/2015 0901   K 4.0 01/08/2013 1742   CL 106 08/25/2015 0901   CL 102 01/08/2013 1742   CO2 29 08/25/2015 0901   CO2 32 01/08/2013 1742   GLUCOSE 209 (H) 08/25/2015 0901   GLUCOSE 122 (H) 01/08/2013 1742   BUN 11 08/25/2015 0901   BUN 13 06/17/2013   BUN 21 (H) 01/08/2013 1742   CREATININE 0.53 08/25/2015 0901   CREATININE 0.74 01/08/2013 1742  CALCIUM 8.5 (L) 08/25/2015 0901   CALCIUM 9.6 01/08/2013 1742   PROT 7.0 08/09/2015 0914   PROT 7.3 01/08/2013 1742   ALBUMIN 4.4 08/09/2015 0914   ALBUMIN 4.1 01/08/2013 1742   AST 18 08/09/2015 0914   AST 39 (H) 01/08/2013 1742   ALT 14 08/09/2015 0914   ALT 50 01/08/2013 1742   ALKPHOS 84 08/09/2015 0914   ALKPHOS 108 01/08/2013 1742   BILITOT 0.4 08/09/2015 0914   BILITOT 0.4 01/08/2013 1742   GFRNONAA >60 08/25/2015 0901   GFRNONAA >60 01/08/2013 1742   GFRAA >60 08/25/2015 0901   GFRAA >60 01/08/2013 1742    Assessment and Plan :  1. Other cerebral palsy ([place])   2. Mental retardation/Microcephaly   3. Seizure ([place]) 4.s/p THR   I have done the exam and reviewed the above chart and it is accurate to the best of my knowledge. Development worker, community has been used in this note in any air is in the dictation or transcription are unintentional.  [facility] 07/01/2016 11:32 AM

## 2016-07-19 ENCOUNTER — Other Ambulatory Visit: Payer: Self-pay | Admitting: Family Medicine

## 2016-09-16 ENCOUNTER — Other Ambulatory Visit: Payer: Self-pay | Admitting: Family Medicine

## 2016-09-16 MED ORDER — LOSARTAN POTASSIUM 100 MG PO TABS: 100.0000 mg | ORAL_TABLET | Freq: Every day | ORAL | 3 refills | Status: DC

## 2016-09-16 NOTE — Telephone Encounter (Signed)
[facility] [place]- [place] (pharmacy) requested a 90-days supply for the following medication. Thanks CC  losartan (COZAAR) 100 MG tablet

## 2016-09-17 DIAGNOSIS — F339 Major depressive disorder, recurrent, unspecified: Secondary | ICD-10-CM | POA: Diagnosis not present

## 2016-11-04 ENCOUNTER — Ambulatory Visit (INDEPENDENT_AMBULATORY_CARE_PROVIDER_SITE_OTHER): Payer: Medicare Other | Admitting: Family Medicine

## 2016-11-04 VITALS — BP 118/50 | HR 76 | Temp 98.2°F | Resp 14 | Wt 125.0 lb

## 2016-11-04 DIAGNOSIS — E538 Deficiency of other specified B group vitamins: Secondary | ICD-10-CM | POA: Diagnosis not present

## 2016-11-04 DIAGNOSIS — E785 Hyperlipidemia, unspecified: Secondary | ICD-10-CM | POA: Diagnosis not present

## 2016-11-04 DIAGNOSIS — Z1211 Encounter for screening for malignant neoplasm of colon: Secondary | ICD-10-CM | POA: Diagnosis not present

## 2016-11-04 DIAGNOSIS — I10 Essential (primary) hypertension: Secondary | ICD-10-CM | POA: Diagnosis not present

## 2016-11-04 NOTE — Patient Instructions (Signed)
Talk with mother regarding flu vaccine.

## 2016-11-04 NOTE — Progress Notes (Signed)
Helen Patton  MRN: [id number] DOB: April 12, 1960  Subjective:  HPI   The patient is a 57 year old female who presents for follow up of chronic conditions.  She is here today with the caregiver from her group home.  Her mother is not here with them today and we will need to have her consent to the patient getting the flu shot and also give information on the patient regarding her pap smear and colonoscopy history.  Patient Active Problem List   Diagnosis Date Noted  . B12 deficiency 05/17/2016  . S/P total hip arthroplasty 08/23/2015  . Acute respiratory failure with hypoxia ([place]) 05/19/2015  . Avascular necrosis of femoral head ([place]) 03/07/2015  . Closed fracture of neck of right femur ([place]) 03/07/2015  . Anxiety 12/14/2014  . Arthralgia of hip 12/14/2014  . Athlete's foot 12/14/2014  . Cerebral palsy ([place]) 12/14/2014  . Cognitive decline 12/14/2014  . Clinical depression 12/14/2014  . Acid reflux 12/14/2014  . HLD (hyperlipidemia) 12/14/2014  . BP (high blood pressure) 12/14/2014  . Methicillin resistant Staphylococcus aureus infection 12/14/2014  . Adiposity 12/14/2014  . Breast cancer ([place]) 11/21/2013  . Mental retardation 11/16/2013  . Seizure ([place]) 09/30/2013    Past Medical History:  Diagnosis Date  . Anxiety   . Arthritis    osteoarthritis  . Breast cancer ([place]) 2015   [name]  . Cancer ([place]) 11/2013   T1c, Nx; ER/ PR +; Her 2 neg invasive mammary carcinoma left breast. Wide excision only based on Tumor Board review.   . Depression   . Difficulty swallowing   . Falls   . GERD (gastroesophageal reflux disease)   . Hidradenitis 2012   MRSA, on chronic topical suppression  . History of hiatal hernia   . Hyperlipidemia   . Hypertension   . Mental retardation   . MRSA (methicillin resistant staph aureus) culture positive 2012  . Osteoporosis   . Pneumonia    aspiration pneumonia  . Seizures [facility])    last seizure February 2017    Social  History   Social History  . Marital status: Single    Spouse name: N/A  . Number of children: N/A  . Years of education: N/A   Occupational History  . Not on file.   Social History Main Topics  . Smoking status: Never Smoker  . Smokeless tobacco: Never Used  . Alcohol use No  . Drug use: No  . Sexual activity: No   Other Topics Concern  . Not on file   Social History Narrative  . No narrative on file    Outpatient Encounter Prescriptions as of 11/04/2016  Medication Sig  . acetaminophen (TYLENOL) 325 MG tablet Take 650 mg by mouth every 6 (six) hours as needed.  Marland Kitchen alendronate (FOSAMAX) 70 MG tablet 1 TAB PO WEEKLY EARLY AM BEFORE FOOD/MEDS W/WATER. DON'T LIE DOWN FOR30 MIN (OSTEOPOROSIS) *NO CRUSH*  . amLODipine (NORVASC) 10 MG tablet Take 1 tablet (10 mg total) by mouth at bedtime.  . benzoyl peroxide (BENZOYL PEROXIDE) 5 % external liquid Apply topically at bedtime.  . calcium-vitamin D (OSCAL WITH D) 500-200 MG-UNIT tablet Take 1 tablet by mouth daily with breakfast.  . Cholecalciferol (VITAMIN D) 2000 units tablet 1 PO daily  . clindamycin (CLEOCIN T) 1 % external solution Apply topically daily.  . clotrimazole (LOTRIMIN) 1 % cream Apply 1 application topically as needed. For rash in skin folds  . cyanocobalamin (,VITAMIN B-12,) 1000 MCG/ML injection  INJECT 1ML INTRAMUSCULARLY EVERY MONTH  . docusate sodium (COLACE) 100 MG capsule Take 1 capsule (100 mg total) by mouth 2 (two) times daily.  . DULoxetine (CYMBALTA) 30 MG capsule Take 30 mg by mouth daily. 3 tablets daily  . lamoTRIgine (LAMICTAL) 150 MG tablet Take 2 tablets (300 mg total) by mouth 2 (two) times daily.  Marland Kitchen letrozole (FEMARA) 2.5 MG tablet Take 1 tablet (2.5 mg total) by mouth daily.  Marland Kitchen loratadine (CLARITIN) 10 MG tablet Take 1 tablet (10 mg total) by mouth daily.  Marland Kitchen LORazepam (ATIVAN) 1 MG tablet Take 1 tablet (1 mg total) by mouth every 8 (eight) hours as needed (take one tablet by mouth as needed for  seizure lasting longer than 3 min.  or more than 2 seizures in 2 hours).  . losartan (COZAAR) 100 MG tablet Take 1 tablet (100 mg total) by mouth daily.  . pantoprazole (PROTONIX) 40 MG tablet Take 1 tablet (40 mg total) by mouth 2 (two) times daily.  Marland Kitchen terbinafine (LAMISIL) 1 % cream Use 2 times a week as needed  . triamcinolone (KENALOG) 0.025 % ointment Apply 1 application topically 2 (two) times daily as needed.   No facility-administered encounter medications on file as of 11/04/2016.     Allergies  Allergen Reactions  . Sulfa Antibiotics Other (See Comments)    "Does not know"  . Keflex [Cephalexin] Other (See Comments)    "Does not know" unknown    Review of Systems  Constitutional: Negative for fever and malaise/fatigue.  Respiratory: Negative for cough, shortness of breath and wheezing.   Cardiovascular: Negative for chest pain and leg swelling.  Gastrointestinal: Negative for abdominal pain.  Neurological: Negative for weakness.    Objective:  BP (!) 118/50 (BP Location: Right Arm, Patient Position: Sitting, Cuff Size: Normal)   Pulse 76   Temp 98.2 F (36.8 C) (Oral)   Resp 14   Wt 125 lb (56.7 kg)   BMI 24.41 kg/m   Physical Exam  Constitutional: She is oriented to person, place, and time and well-developed, well-nourished, and in no distress.  HENT:  Head: Atraumatic.  Eyes: Conjunctivae are normal. No scleral icterus.  Neck: No thyromegaly present.  Cardiovascular: Normal rate, regular rhythm and normal heart sounds.   Pulmonary/Chest: Effort normal and breath sounds normal.  Abdominal: Soft.  Neurological: She is alert and oriented to person, place, and time. Gait normal. GCS score is 15.  Skin: Skin is warm and dry.  Psychiatric: Mood, memory, affect and judgment normal.    Assessment and Plan :  Congenital Microcephaly S/p THR S/p Lumpectomy for Breast Cancer GERD HM No Pap due to low risk. Will consider colon screen with cologard.  I have done  the exam and reviewed the chart and it is accurate to the best of my knowledge. Development worker, community has been used and  any errors in dictation or transcription are unintentional. Miguel Aschoff M.D. [facility]

## 2016-11-05 ENCOUNTER — Telehealth: Payer: Self-pay | Admitting: Family Medicine

## 2016-11-05 NOTE — Telephone Encounter (Signed)
Order for cologuard faxed to [facility] °

## 2016-11-06 DIAGNOSIS — I10 Essential (primary) hypertension: Secondary | ICD-10-CM | POA: Diagnosis not present

## 2016-11-06 DIAGNOSIS — E785 Hyperlipidemia, unspecified: Secondary | ICD-10-CM | POA: Diagnosis not present

## 2016-11-06 LAB — CBC WITH DIFFERENTIAL/PLATELET
BASOS PCT: 0.7 %
Basophils Absolute: 54 cells/uL (ref 0–200)
EOS ABS: 92 {cells}/uL (ref 15–500)
Eosinophils Relative: 1.2 %
HCT: 34 % — ABNORMAL LOW (ref 35.0–45.0)
HEMOGLOBIN: 11.4 g/dL — AB (ref 11.7–15.5)
Lymphs Abs: 2264 cells/uL (ref 850–3900)
MCH: 29.8 pg (ref 27.0–33.0)
MCHC: 33.5 g/dL (ref 32.0–36.0)
MCV: 88.8 fL (ref 80.0–100.0)
MPV: 9.1 fL (ref 7.5–12.5)
Monocytes Relative: 11.5 %
Neutro Abs: 4404 cells/uL (ref 1500–7800)
Neutrophils Relative %: 57.2 %
PLATELETS: 375 10*3/uL (ref 140–400)
RBC: 3.83 10*6/uL (ref 3.80–5.10)
RDW: 12.6 % (ref 11.0–15.0)
TOTAL LYMPHOCYTE: 29.4 %
WBC: 7.7 10*3/uL (ref 3.8–10.8)
WBCMIX: 886 {cells}/uL (ref 200–950)

## 2016-11-06 LAB — COMPLETE METABOLIC PANEL WITH GFR
AG RATIO: 2.1 (calc) (ref 1.0–2.5)
ALBUMIN MSPROF: 4.2 g/dL (ref 3.6–5.1)
ALKALINE PHOSPHATASE (APISO): 63 U/L (ref 33–130)
ALT: 9 U/L (ref 6–29)
AST: 13 U/L (ref 10–35)
BILIRUBIN TOTAL: 0.4 mg/dL (ref 0.2–1.2)
BUN: 15 mg/dL (ref 7–25)
CO2: 32 mmol/L (ref 20–32)
CREATININE: 0.78 mg/dL (ref 0.50–1.05)
Calcium: 9.3 mg/dL (ref 8.6–10.4)
Chloride: 102 mmol/L (ref 98–110)
GFR, Est African American: 97 mL/min/{1.73_m2} (ref >=60)
GFR, Est Non African American: 84 mL/min/{1.73_m2} (ref >=60)
GLOBULIN: 2 g/dL (ref 1.9–3.7)
Glucose, Bld: 96 mg/dL (ref 65–99)
POTASSIUM: 3.8 mmol/L (ref 3.5–5.3)
SODIUM: 142 mmol/L (ref 135–146)
Total Protein: 6.2 g/dL (ref 6.1–8.1)

## 2016-11-06 LAB — TSH: TSH: 1.61 mIU/L (ref 0.40–4.50)

## 2016-11-06 LAB — LIPID PANEL
CHOL/HDL RATIO: 2.5 (calc) (ref <5.0)
CHOLESTEROL: 276 mg/dL — AB (ref <200)
HDL: 109 mg/dL (ref >50)
LDL Cholesterol (Calc): 152 mg/dL (calc) — ABNORMAL HIGH
Non-HDL Cholesterol (Calc): 167 mg/dL (calc) — ABNORMAL HIGH (ref <130)
Triglycerides: 62 mg/dL (ref <150)

## 2016-11-08 ENCOUNTER — Telehealth: Payer: Self-pay

## 2016-11-08 NOTE — Telephone Encounter (Signed)
attempted to reach patient and or caregiver and home and cell and both lines were busy at the moment. KW

## 2016-11-08 NOTE — Telephone Encounter (Signed)
-----   Message from Jerrol Banana., MD sent at 11/07/2016  3:56 PM EDT ----- Labs okay

## 2016-11-11 NOTE — Telephone Encounter (Signed)
Spoke with Elouise Munroe, director of the group home and advised of below-Anastasiya V Hopkins, RMA

## 2016-11-19 ENCOUNTER — Other Ambulatory Visit: Payer: Self-pay | Admitting: Family Medicine

## 2016-12-03 ENCOUNTER — Other Ambulatory Visit: Payer: Self-pay

## 2016-12-03 DIAGNOSIS — Z170 Estrogen receptor positive status [ER+]: Principal | ICD-10-CM

## 2016-12-03 DIAGNOSIS — C50512 Malignant neoplasm of lower-outer quadrant of left female breast: Secondary | ICD-10-CM

## 2016-12-27 ENCOUNTER — Other Ambulatory Visit: Payer: Self-pay | Admitting: Family Medicine

## 2016-12-31 ENCOUNTER — Ambulatory Visit: Payer: Self-pay | Admitting: Family Medicine

## 2016-12-31 NOTE — Progress Notes (Deleted)
Patient: Helen Patton Female    DOB: [birth year]   56 y.o.   MRN: [id number] Visit Date: 12/31/2016  Today's Provider: Vernie Murders, PA   No chief complaint on file.  Subjective:    Eye Problem   The left eye is affected. This is a new problem. Associated symptoms comments: Swelling .       Allergies  Allergen Reactions  . Sulfa Antibiotics Other (See Comments)    "Does not know"  . Keflex [Cephalexin] Other (See Comments)    "Does not know" unknown     Current Outpatient Medications:  .  acetaminophen (TYLENOL) 325 MG tablet, Take 650 mg by mouth every 6 (six) hours as needed., Disp: , Rfl:  .  alendronate (FOSAMAX) 70 MG tablet, TAKE ONE TABLET BY MOUTH EVERY SUNDAY EARLY MORNING BEFORE FOODS/MEDSWITH WATER. DONT LIE DOWN FOR 30 MINUTES, Disp: 4 tablet, Rfl: 12 .  amLODipine (NORVASC) 10 MG tablet, TAKE 1 TABLET BY MOUTH AT BEDTIME, Disp: 30 tablet, Rfl: 12 .  benzoyl peroxide (BENZOYL PEROXIDE) 5 % external liquid, Apply topically at bedtime., Disp: 237 g, Rfl: 0 .  calcium-vitamin D (OSCAL WITH D) 500-200 MG-UNIT tablet, Take 1 tablet by mouth daily with breakfast., Disp: 30 tablet, Rfl: 12 .  Cholecalciferol (VITAMIN D) 2000 units tablet, 1 PO daily, Disp: 30 tablet, Rfl: 12 .  clindamycin (CLEOCIN T) 1 % external solution, Apply topically daily., Disp: 60 mL, Rfl: 12 .  clotrimazole (LOTRIMIN) 1 % cream, Apply 1 application topically as needed. For rash in skin folds, Disp: 30 g, Rfl: 12 .  cyanocobalamin (,VITAMIN B-12,) 1000 MCG/ML injection, INJECT 1ML INTRAMUSCULARLY EVERY MONTH, Disp: 1 mL, Rfl: 11 .  docusate sodium (COLACE) 100 MG capsule, Take 1 capsule (100 mg total) by mouth 2 (two) times daily., Disp: 60 capsule, Rfl: 12 .  DULoxetine (CYMBALTA) 30 MG capsule, Take 30 mg by mouth daily. 3 tablets daily, Disp: , Rfl:  .  lamoTRIgine (LAMICTAL) 150 MG tablet, Take 2 tablets (300 mg total) by mouth 2 (two) times daily., Disp: 60 tablet, Rfl: 12 .   letrozole (FEMARA) 2.5 MG tablet, Take 1 tablet (2.5 mg total) by mouth daily., Disp: 30 tablet, Rfl: 12 .  loratadine (CLARITIN) 10 MG tablet, Take 1 tablet (10 mg total) by mouth daily., Disp: 30 tablet, Rfl: 12 .  LORazepam (ATIVAN) 1 MG tablet, Take 1 tablet (1 mg total) by mouth every 8 (eight) hours as needed (take one tablet by mouth as needed for seizure lasting longer than 3 min.  or more than 2 seizures in 2 hours)., Disp: 25 tablet, Rfl: 0 .  losartan (COZAAR) 100 MG tablet, Take 1 tablet (100 mg total) by mouth daily., Disp: 90 tablet, Rfl: 3 .  pantoprazole (PROTONIX) 40 MG tablet, Take 1 tablet (40 mg total) by mouth 2 (two) times daily., Disp: 60 tablet, Rfl: 12 .  terbinafine (LAMISIL) 1 % cream, Use 2 times a week as needed, Disp: 30 g, Rfl: 2 .  triamcinolone (KENALOG) 0.025 % ointment, Apply 1 application topically 2 (two) times daily as needed., Disp: , Rfl:   Review of Systems  Constitutional: Negative.   Eyes:       Left eye swelling   Respiratory: Negative.   Cardiovascular: Negative.     Social History   Tobacco Use  . Smoking status: Never Smoker  . Smokeless tobacco: Never Used  Substance Use Topics  . Alcohol use: No  Objective:   There were no vitals taken for this visit.   Physical Exam      Assessment & Plan:           Vernie Murders, PA  [facility]

## 2017-01-01 ENCOUNTER — Ambulatory Visit (INDEPENDENT_AMBULATORY_CARE_PROVIDER_SITE_OTHER): Payer: Medicare Other | Admitting: Family Medicine

## 2017-01-01 VITALS — BP 102/50 | HR 86 | Temp 98.5°F | Resp 18 | Wt 126.0 lb

## 2017-01-01 DIAGNOSIS — B309 Viral conjunctivitis, unspecified: Secondary | ICD-10-CM | POA: Diagnosis not present

## 2017-01-01 DIAGNOSIS — J069 Acute upper respiratory infection, unspecified: Secondary | ICD-10-CM | POA: Diagnosis not present

## 2017-01-01 MED ORDER — SALINE SPRAY 0.65 % NA SOLN: 2.0000 | Freq: Two times a day (BID) | NASAL | 0 refills | Status: DC

## 2017-01-01 NOTE — Patient Instructions (Signed)
Continue warm compresses  Try nasal saline for congestion   Viral Conjunctivitis, Adult Viral conjunctivitis is an inflammation of the clear membrane that covers the white part of your eye and the inner surface of your eyelid (conjunctiva). The inflammation is caused by a viral infection. The blood vessels in the conjunctiva become inflamed, causing the eye to become red or pink, and often itchy. Viral conjunctivitis can be easily passed from one person to another (is contagious). This condition is often called pink eye. What are the causes? This condition is caused by a virus. A virus is a type of contagious germ. It can be spread by touching objects that have been contaminated with the virus, such as doorknobs or towels. It can also be passed through droplets, such as from coughing or sneezing. What are the signs or symptoms? Symptoms of this condition include:  Eye redness.  Tearing or watery eyes.  Itchy and irritated eyes.  Burning feeling in the eyes.  Clear drainage from the eye.  Swollen eyelids.  A gritty feeling in the eye.  Light sensitivity.  This condition often occurs with other symptoms, such as a fever, nausea, or a rash. How is this diagnosed? This condition is diagnosed with a medical history and physical exam. If you have discharge from your eye, the discharge may be tested to rule out other causes of conjunctivitis. How is this treated? Viral conjunctivitis does not respond to medicines that kill bacteria (antibiotics). Treatment for viral conjunctivitis is directed at stopping a bacterial infection from developing in addition to the viral infection. Treatment also aims to relieve your symptoms, such as itching. This may be done with antihistamine drops or other eye medicines. Rarely, steroid eye drops or antiviral medicines may be prescribed. Follow these instructions at home: Medicines   Take or apply over-the-counter and prescription medicines only as told  by your health care provider.  Be very careful to avoid touching the edge of the eyelid with the eye drop bottle or ointment tube when applying medicines to the affected eye. Being careful this way will stop you from spreading the infection to the other eye or to other people. Eye care  Avoid touching or rubbing your eyes.  Apply a warm, wet, clean washcloth to your eye for 10-20 minutes, 3-4 times per day or as told by your health care provider.  If you wear contact lenses, do not wear them until the inflammation is gone and your health care provider says it is safe to wear them again. Ask your health care provider how to sterilize or replace your contact lenses before using them again. Wear glasses until you can resume wearing contacts.  Avoid wearing eye makeup until the inflammation is gone. Throw away any old eye cosmetics that may be contaminated.  Gently wipe away any drainage from your eye with a warm, wet washcloth or a cotton ball. General instructions  Change or wash your pillowcase every day or as told by your health care provider.  Do not share towels, pillowcases, washcloths, eye makeup, makeup brushes, contact lenses, or glasses. This may spread the infection.  Wash your hands often with soap and water. Use paper towels to dry your hands. If soap and water are not available, use hand sanitizer.  Try to avoid contact with other people for one week or as told by your health care provider. Contact a health care provider if:  Your symptoms do not improve with treatment or they get worse.  You have  increased pain.  Your vision becomes blurry.  You have a fever.  You have facial pain, redness, or swelling.  You have yellow or green drainage coming from your eye.  You have new symptoms. This information is not intended to replace advice given to you by your health care provider. Make sure you discuss any questions you have with your health care provider. Document  Released: 04/20/2002 Document Revised: 08/26/2015 Document Reviewed: 08/15/2015 Elsevier Interactive Patient Education  2017 [employer].

## 2017-01-01 NOTE — Progress Notes (Signed)
Patient: Helen Patton Female    DOB: [birth year]   56 y.o.   MRN: [id number] Visit Date: 01/01/2017  Today's Provider: Lavon Paganini, MD   Chief Complaint  Patient presents with  . Eye Drainage   Subjective:    HPI  Patient is here with her caregiver. Caregiver states patient started to have eye drainage and redness yesterday 12/31/16, some runny nose, nasal congestion, slight cough present also. This morning her eyes were matted up. Caregiver has been applying warm compresses to her eyes and redness has improved now. No fever that they know of.     Allergies  Allergen Reactions  . Sulfa Antibiotics Other (See Comments)    "Does not know"  . Keflex [Cephalexin] Other (See Comments)    "Does not know" unknown     Current Outpatient Medications:  .  acetaminophen (TYLENOL) 325 MG tablet, Take 650 mg by mouth every 6 (six) hours as needed., Disp: , Rfl:  .  alendronate (FOSAMAX) 70 MG tablet, TAKE ONE TABLET BY MOUTH EVERY SUNDAY EARLY MORNING BEFORE FOODS/MEDSWITH WATER. DONT LIE DOWN FOR 30 MINUTES, Disp: 4 tablet, Rfl: 12 .  amLODipine (NORVASC) 10 MG tablet, TAKE 1 TABLET BY MOUTH AT BEDTIME, Disp: 30 tablet, Rfl: 12 .  benzoyl peroxide (BENZOYL PEROXIDE) 5 % external liquid, Apply topically at bedtime., Disp: 237 g, Rfl: 0 .  calcium-vitamin D (OSCAL WITH D) 500-200 MG-UNIT tablet, Take 1 tablet by mouth daily with breakfast., Disp: 30 tablet, Rfl: 12 .  Cholecalciferol (VITAMIN D) 2000 units tablet, 1 PO daily, Disp: 30 tablet, Rfl: 12 .  clindamycin (CLEOCIN T) 1 % external solution, Apply topically daily., Disp: 60 mL, Rfl: 12 .  clotrimazole (LOTRIMIN) 1 % cream, Apply 1 application topically as needed. For rash in skin folds, Disp: 30 g, Rfl: 12 .  cyanocobalamin (,VITAMIN B-12,) 1000 MCG/ML injection, INJECT 1ML INTRAMUSCULARLY EVERY MONTH, Disp: 1 mL, Rfl: 11 .  docusate sodium (COLACE) 100 MG capsule, Take 1 capsule (100 mg total) by mouth 2 (two)  times daily., Disp: 60 capsule, Rfl: 12 .  DULoxetine (CYMBALTA) 30 MG capsule, Take 30 mg by mouth daily. 3 tablets daily, Disp: , Rfl:  .  lamoTRIgine (LAMICTAL) 150 MG tablet, Take 2 tablets (300 mg total) by mouth 2 (two) times daily., Disp: 60 tablet, Rfl: 12 .  letrozole (FEMARA) 2.5 MG tablet, Take 1 tablet (2.5 mg total) by mouth daily., Disp: 30 tablet, Rfl: 12 .  loratadine (CLARITIN) 10 MG tablet, Take 1 tablet (10 mg total) by mouth daily., Disp: 30 tablet, Rfl: 12 .  LORazepam (ATIVAN) 1 MG tablet, Take 1 tablet (1 mg total) by mouth every 8 (eight) hours as needed (take one tablet by mouth as needed for seizure lasting longer than 3 min.  or more than 2 seizures in 2 hours)., Disp: 25 tablet, Rfl: 0 .  losartan (COZAAR) 100 MG tablet, Take 1 tablet (100 mg total) by mouth daily., Disp: 90 tablet, Rfl: 3 .  pantoprazole (PROTONIX) 40 MG tablet, Take 1 tablet (40 mg total) by mouth 2 (two) times daily., Disp: 60 tablet, Rfl: 12 .  terbinafine (LAMISIL) 1 % cream, Use 2 times a week as needed, Disp: 30 g, Rfl: 2 .  triamcinolone (KENALOG) 0.025 % ointment, Apply 1 application topically 2 (two) times daily as needed., Disp: , Rfl:   Review of Systems  Constitutional: Negative.   HENT: Positive for congestion, postnasal drip, rhinorrhea and sneezing.  Eyes: Positive for discharge and redness.  Respiratory: Positive for cough.   Cardiovascular: Negative.   Neurological: Positive for speech difficulty (chronic).    Social History   Tobacco Use  . Smoking status: Never Smoker  . Smokeless tobacco: Never Used  Substance Use Topics  . Alcohol use: No   Objective:   BP (!) 102/50   Pulse 86   Temp 98.5 F (36.9 C)   Resp 18   Wt 126 lb (57.2 kg)   BMI 24.61 kg/m  Vitals:   01/01/17 1107  BP: (!) 102/50  Pulse: 86  Resp: 18  Temp: 98.5 F (36.9 C)  Weight: 126 lb (57.2 kg)     Physical Exam  Constitutional: She appears well-developed and well-nourished. No  distress.  HENT:  Head: Normocephalic and atraumatic.  Right Ear: Tympanic membrane, external ear and ear canal normal.  Left Ear: Tympanic membrane, external ear and ear canal normal.  Nose: Rhinorrhea present. Right sinus exhibits no maxillary sinus tenderness and no frontal sinus tenderness. Left sinus exhibits no maxillary sinus tenderness and no frontal sinus tenderness.  Mouth/Throat: Oropharynx is clear and moist.  Eyes: EOM are normal. Pupils are equal, round, and reactive to light. Right eye exhibits discharge. Left eye exhibits discharge. Right conjunctiva is injected. Left conjunctiva is injected.  Cardiovascular: Normal rate and regular rhythm.  Murmur heard. Pulmonary/Chest: Effort normal and breath sounds normal. No respiratory distress. She has no wheezes. She has no rales.  Neurological: She is alert.  Skin: Skin is warm and dry. No rash noted.  Vitals reviewed.       Assessment & Plan:     1. Viral conjunctivitis, 2. Viral URI - symptoms consistent with viral URI with b/l conjunctivitis - drainage is minimal and worse in AM - no conernign features for CAP, AOM, bacterial conjunctivits, periorbital cellulitis - discussed infection spread prevention and handwashing - symptomatic management with warm compresses and nasal saline - return precautions discussed  Return if symptoms worsen or fail to improve.     The entirety of the information documented in the History of Present Illness, Review of Systems and Physical Exam were personally obtained by me. Portions of this information were initially documented by [facility], CMA and reviewed by me for thoroughness and accuracy.     Lavon Paganini, MD  [facility]

## 2017-01-22 ENCOUNTER — Other Ambulatory Visit: Payer: Medicare Other

## 2017-01-28 ENCOUNTER — Ambulatory Visit: Payer: Medicare Other | Admitting: General Surgery

## 2017-02-03 ENCOUNTER — Other Ambulatory Visit: Payer: Self-pay | Admitting: Family Medicine

## 2017-02-13 DIAGNOSIS — H25813 Combined forms of age-related cataract, bilateral: Secondary | ICD-10-CM | POA: Diagnosis not present

## 2017-03-07 ENCOUNTER — Ambulatory Visit
Admission: RE | Admit: 2017-03-07 | Discharge: 2017-03-07 | Disposition: A | Discharge status: Home / Self Care | Payer: Medicare Other | Source: Ambulatory Visit | Attending: General Surgery | Admitting: General Surgery

## 2017-03-07 DIAGNOSIS — R928 Other abnormal and inconclusive findings on diagnostic imaging of breast: Secondary | ICD-10-CM | POA: Diagnosis not present

## 2017-03-07 DIAGNOSIS — C50512 Malignant neoplasm of lower-outer quadrant of left female breast: Secondary | ICD-10-CM

## 2017-03-07 DIAGNOSIS — Z170 Estrogen receptor positive status [ER+]: Principal | ICD-10-CM

## 2017-03-07 DIAGNOSIS — Z853 Personal history of malignant neoplasm of breast: Secondary | ICD-10-CM | POA: Insufficient documentation

## 2017-03-18 ENCOUNTER — Ambulatory Visit (INDEPENDENT_AMBULATORY_CARE_PROVIDER_SITE_OTHER): Payer: Medicare Other | Admitting: General Surgery

## 2017-03-18 ENCOUNTER — Encounter: Payer: Self-pay | Admitting: General Surgery

## 2017-03-18 VITALS — BP 130/74 | HR 70 | Resp 14 | Ht 60.0 in | Wt 128.0 lb

## 2017-03-18 DIAGNOSIS — F339 Major depressive disorder, recurrent, unspecified: Secondary | ICD-10-CM | POA: Diagnosis not present

## 2017-03-18 DIAGNOSIS — Z170 Estrogen receptor positive status [ER+]: Secondary | ICD-10-CM

## 2017-03-18 DIAGNOSIS — C50512 Malignant neoplasm of lower-outer quadrant of left female breast: Secondary | ICD-10-CM | POA: Diagnosis not present

## 2017-03-18 NOTE — Patient Instructions (Signed)
The patient has been asked to return to the office in one year with a bilateral diagnostic mammogram. 

## 2017-03-18 NOTE — Progress Notes (Signed)
Patient ID: Helen Patton, female   DOB: Aug 15, 1960, 57 y.o.   MRN: [id number]  Chief Complaint  Patient presents with  . Follow-up    HPI Helen Patton is a 57 y.o. female who presents for a breast evaluation. The most recent mammogram was done on 03/07/17 .  Patient does perform regular self breast checks and gets regular mammograms done.   Caregiver, Helen Patton is present at visit.   HPI  Past Medical History:  Diagnosis Date  . Anxiety   . Arthritis    osteoarthritis  . Breast cancer ([place]) 2015   [name]  . Cancer ([place]) 11/2013   T1c, Nx; ER/ PR +; Her 2 neg invasive mammary carcinoma left breast. Wide excision only based on Tumor Board review.   . Depression   . Difficulty swallowing   . Falls   . GERD (gastroesophageal reflux disease)   . Hidradenitis 2012   MRSA, on chronic topical suppression  . History of hiatal hernia   . Hyperlipidemia   . Hypertension   . Mental retardation   . MRSA (methicillin resistant staph aureus) culture positive 2012  . Osteoporosis   . Pneumonia    aspiration pneumonia  . Seizures [facility])    last seizure February 2017    Past Surgical History:  Procedure Laterality Date  . BREAST LUMPECTOMY Left 2015   LEFT lumpectomy  . BREAST SURGERY Left 12-07-13   T1b, Nx; ER+, PR+, her 2 pending. (Not candidate for chemotherapy)  . broken leg    . CERVICAL ABLATION    . 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 dislocated hip    . HARDWARE REMOVAL  08/23/2015   Procedure: HARDWARE REMOVAL;  Surgeon: Dereck Leep, MD;  Location: [facility] ORS;  Service: Orthopedics;;  . TOTAL HIP REVISION Right 08/23/2015   Procedure: TOTAL HIP REVISION;  Surgeon: Dereck Leep, MD;  Location: [facility] ORS;  Service: Orthopedics;  Laterality: Right;    Family History  Problem Relation Age of Onset  . Diabetes Father   .  Parkinson's disease Father   . Neuropathy Mother   . Arthritis Mother   . Diabetes Mother   . Autoimmune disease Brother        AIDS  . Cancer Maternal Aunt 63       breast  . Breast cancer Maternal Aunt   . Hypertension Maternal Grandmother   . Cancer Paternal Grandmother     Social History Social History   Tobacco Use  . Smoking status: Never Smoker  . Smokeless tobacco: Never Used  Substance Use Topics  . Alcohol use: No  . Drug use: No    Allergies  Allergen Reactions  . Sulfa Antibiotics Other (See Comments)    "Does not know"  . Keflex [Cephalexin] Other (See Comments)    "Does not know" unknown    Current Outpatient Medications  Medication Sig Dispense Refill  . acetaminophen (TYLENOL) 325 MG tablet Take 650 mg by mouth every 6 (six) hours as needed.    Marland Kitchen alendronate (FOSAMAX) 70 MG tablet TAKE ONE TABLET BY MOUTH EVERY SUNDAY EARLY MORNING BEFORE FOODS/MEDSWITH WATER. DONT LIE DOWN FOR 30 MINUTES 4 tablet 12  . amLODipine (NORVASC) 10 MG tablet TAKE 1 TABLET BY MOUTH AT BEDTIME 30 tablet 12  . BENZOYL PEROXIDE 5 % external wash USE TOPICALLY ONCE DAILY  237 g 0  . calcium-vitamin D (OSCAL WITH D) 500-200 MG-UNIT tablet Take 1 tablet by mouth daily with breakfast. 30 tablet 12  . Cholecalciferol (VITAMIN D) 2000 units tablet 1 PO daily 30 tablet 12  . clindamycin (CLEOCIN T) 1 % external solution Apply topically daily. 60 mL 12  . clotrimazole (LOTRIMIN) 1 % cream APPLY TOPICALLY TWICE A DAY AS NEEDED FOR RASH IN SKIN FOLDS 30 g 0  . cyanocobalamin (,VITAMIN B-12,) 1000 MCG/ML injection INJECT 1ML INTRAMUSCULARLY EVERY MONTH 1 mL 11  . docusate sodium (COLACE) 100 MG capsule Take 1 capsule (100 mg total) by mouth 2 (two) times daily. 60 capsule 12  . DULoxetine (CYMBALTA) 30 MG capsule Take 30 mg by mouth daily. 3 tablets daily    . lamoTRIgine (LAMICTAL) 150 MG tablet Take 2 tablets (300 mg total) by mouth 2 (two) times daily. 60 tablet 12  . letrozole (FEMARA)  2.5 MG tablet Take 1 tablet (2.5 mg total) by mouth daily. 30 tablet 12  . loratadine (CLARITIN) 10 MG tablet Take 1 tablet (10 mg total) by mouth daily. 30 tablet 12  . LORazepam (ATIVAN) 1 MG tablet Take 1 tablet (1 mg total) by mouth every 8 (eight) hours as needed (take one tablet by mouth as needed for seizure lasting longer than 3 min.  or more than 2 seizures in 2 hours). 25 tablet 0  . losartan (COZAAR) 100 MG tablet Take 1 tablet (100 mg total) by mouth daily. 90 tablet 3  . pantoprazole (PROTONIX) 40 MG tablet Take 1 tablet (40 mg total) by mouth 2 (two) times daily. 60 tablet 12  . sodium chloride (OCEAN) 0.65 % SOLN nasal spray Place 2 sprays into both nostrils 2 (two) times daily for 7 days. 15 mL 0  . terbinafine (LAMISIL) 1 % cream Use 2 times a week as needed 30 g 2  . triamcinolone (KENALOG) 0.025 % ointment Apply 1 application topically 2 (two) times daily as needed.     No current facility-administered medications for this visit.     Review of Systems Review of Systems  Constitutional: Negative.   Respiratory: Negative.   Cardiovascular: Negative.     Blood pressure 130/74, pulse 70, resp. rate 14, height 5' (1.524 m), weight 128 lb (58.1 kg).  Physical Exam Physical Exam  Constitutional: She is oriented to person, place, and time. She appears well-developed and well-nourished.  Eyes: Conjunctivae are normal. No scleral icterus.  Neck: Neck supple.  Cardiovascular: Normal rate and regular rhythm.  Murmur heard.  Systolic murmur is present with a grade of 2/6. Pulmonary/Chest: Effort normal and breath sounds normal. Right breast exhibits no inverted nipple, no mass, no nipple discharge, no skin change and no tenderness. Left breast exhibits no inverted nipple, no mass, no nipple discharge, no skin change and no tenderness.    Lymphadenopathy:    She has no cervical adenopathy.    She has no axillary adenopathy.       Left: No supraclavicular adenopathy present.   Neurological: She is alert and oriented to person, place, and time.  Skin: Skin is warm and dry.    Data Reviewed March 07, 2017 bilateral diagnostic mammograms were reviewed.  Postsurgical changes.  BI-RADS-2.  Assessment    No evidence of recurrent disease.  *  Plan     The patient has been asked to return to the office in one year with a bilateral diagnostic mammogram.  HPI, Physical Exam, Assessment and Plan have  been scribed under the direction and in the presence of Hervey Ard, MD.  Gaspar Cola, CMA  I have completed the exam and reviewed the above documentation for accuracy and completeness.  I agree with the above.  Haematologist has been used and any errors in dictation or transcription are unintentional.  Hervey Ard, M.D., F.A.C.S.    Forest Gleason Evelyna Folker 03/18/2017, 9:14 AM

## 2017-03-19 ENCOUNTER — Other Ambulatory Visit: Payer: Self-pay | Admitting: Family Medicine

## 2017-03-19 MED ORDER — LETROZOLE 2.5 MG PO TABS: 2.5000 mg | ORAL_TABLET | Freq: Every day | ORAL | 12 refills | Status: DC

## 2017-03-19 MED ORDER — LORATADINE 10 MG PO TABS: 10.0000 mg | ORAL_TABLET | Freq: Every day | ORAL | 12 refills | Status: DC

## 2017-03-19 MED ORDER — PANTOPRAZOLE SODIUM 40 MG PO TBEC: 40.0000 mg | DELAYED_RELEASE_TABLET | Freq: Two times a day (BID) | ORAL | 12 refills | Status: DC

## 2017-03-19 MED ORDER — CALCIUM CARBONATE-VITAMIN D 500-200 MG-UNIT PO TABS: 1.0000 | ORAL_TABLET | Freq: Every day | ORAL | 12 refills | Status: DC

## 2017-03-19 NOTE — Telephone Encounter (Signed)
[facility] faxed a refill request for the following medications. Thanks CC  pantoprazole (PROTONIX) 40 MG tablet   loratadine (CLARITIN) 10 MG tablet   letrozole (FEMARA) 2.5 MG tablet   calcium-vitamin D (OSCAL WITH D) 500-200 MG-UNIT tablet   pantoprazole (PROTONIX) 40 MG tablet

## 2017-04-17 ENCOUNTER — Other Ambulatory Visit: Payer: Self-pay | Admitting: Family Medicine

## 2017-04-17 DIAGNOSIS — I10 Essential (primary) hypertension: Secondary | ICD-10-CM

## 2017-04-17 MED ORDER — LOSARTAN POTASSIUM 100 MG PO TABS: 100.0000 mg | ORAL_TABLET | Freq: Every day | ORAL | 3 refills | Status: DC

## 2017-04-17 NOTE — Telephone Encounter (Signed)
[facility] faxed a refill request for the following medication. Thanks CC  losartan (COZAAR) 100 MG tablet

## 2017-04-17 NOTE — Telephone Encounter (Signed)
Sent in

## 2017-04-21 ENCOUNTER — Other Ambulatory Visit: Payer: Self-pay | Admitting: Family Medicine

## 2017-04-30 ENCOUNTER — Encounter: Payer: Self-pay | Admitting: Family Medicine

## 2017-04-30 ENCOUNTER — Ambulatory Visit: Payer: Medicare Other

## 2017-05-08 ENCOUNTER — Ambulatory Visit (INDEPENDENT_AMBULATORY_CARE_PROVIDER_SITE_OTHER): Payer: Medicare Other | Admitting: Family Medicine

## 2017-05-08 ENCOUNTER — Other Ambulatory Visit: Payer: Self-pay

## 2017-05-08 VITALS — BP 110/70 | HR 82 | Temp 98.0°F | Resp 14 | Wt 127.0 lb

## 2017-05-08 DIAGNOSIS — B351 Tinea unguium: Secondary | ICD-10-CM

## 2017-05-08 DIAGNOSIS — L98491 Non-pressure chronic ulcer of skin of other sites limited to breakdown of skin: Secondary | ICD-10-CM

## 2017-05-08 DIAGNOSIS — L309 Dermatitis, unspecified: Secondary | ICD-10-CM

## 2017-05-08 MED ORDER — COAL TAR EXTRACT 0.5 % EX SHAM: MEDICATED_SHAMPOO | Freq: Every evening | CUTANEOUS | 12 refills | Status: DC | PRN

## 2017-05-08 NOTE — Progress Notes (Signed)
Helen Patton  MRN: [id number] DOB: 08-Feb-1961  Subjective:  HPI   The patient is a 57 year old female who presents for evaluation of a rash on her neck, spots in her scalp and a black place on her 4th right toe.  The rash was noticed 4 days ago and the black spot on the toe has been present for about a week.   She has not had any fever or drainage from any of the spots or rash.    Her caregiver would also like to get referral to podiatry to have her nail clipped.  Patient Active Problem List   Diagnosis Date Noted  . B12 deficiency 05/17/2016  . S/P total hip arthroplasty 08/23/2015  . Acute respiratory failure with hypoxia ([place]) 05/19/2015  . Avascular necrosis of femoral head ([place]) 03/07/2015  . Closed fracture of neck of right femur ([place]) 03/07/2015  . Anxiety 12/14/2014  . Arthralgia of hip 12/14/2014  . Athlete's foot 12/14/2014  . Cerebral palsy ([place]) 12/14/2014  . Cognitive decline 12/14/2014  . Clinical depression 12/14/2014  . Acid reflux 12/14/2014  . HLD (hyperlipidemia) 12/14/2014  . BP (high blood pressure) 12/14/2014  . Methicillin resistant Staphylococcus aureus infection 12/14/2014  . Adiposity 12/14/2014  . Breast cancer ([place]) 11/21/2013  . Mental retardation 11/16/2013  . Seizure ([place]) 09/30/2013    Past Medical History:  Diagnosis Date  . Anxiety   . Arthritis    osteoarthritis  . Breast cancer ([place]) 2015   [name]  . Cancer ([place]) 11/2013   T1c, Nx; ER/ PR +; Her 2 neg invasive mammary carcinoma left breast. Wide excision only based on Tumor Board review.   . Depression   . Difficulty swallowing   . Falls   . GERD (gastroesophageal reflux disease)   . Hidradenitis 2012   MRSA, on chronic topical suppression  . History of hiatal hernia   . Hyperlipidemia   . Hypertension   . Mental retardation   . MRSA (methicillin resistant staph aureus) culture positive 2012  . Osteoporosis   . Pneumonia    aspiration pneumonia  .  Seizures [facility])    last seizure February 2017    Social History   Socioeconomic History  . Marital status: Single    Spouse name: Not on file  . Number of children: Not on file  . Years of education: Not on file  . Highest education level: Not on file  Occupational History  . Not on file  Social Needs  . Financial resource strain: Not on file  . Food insecurity:    Worry: Not on file    Inability: Not on file  . Transportation needs:    Medical: Not on file    Non-medical: Not on file  Tobacco Use  . Smoking status: Never Smoker  . Smokeless tobacco: Never Used  Substance and Sexual Activity  . Alcohol use: No  . Drug use: No  . Sexual activity: Never  Lifestyle  . Physical activity:    Days per week: Not on file    Minutes per session: Not on file  . Stress: Not on file  Relationships  . Social connections:    Talks on phone: Not on file    Gets together: Not on file    Attends religious service: Not on file    Active member of club or organization: Not on file    Attends meetings of clubs or organizations: Not on file  Relationship status: Not on file  . Intimate partner violence:    Fear of current or ex partner: Not on file    Emotionally abused: Not on file    Physically abused: Not on file    Forced sexual activity: Not on file  Other Topics Concern  . Not on file  Social History Narrative  . Not on file    Outpatient Encounter Medications as of 05/08/2017  Medication Sig  . acetaminophen (TYLENOL) 325 MG tablet Take 650 mg by mouth every 6 (six) hours as needed.  Marland Kitchen alendronate (FOSAMAX) 70 MG tablet TAKE ONE TABLET BY MOUTH EVERY SUNDAY EARLY MORNING BEFORE FOODS/MEDSWITH WATER. DONT LIE DOWN FOR 30 MINUTES  . amLODipine (NORVASC) 10 MG tablet TAKE 1 TABLET BY MOUTH AT BEDTIME  . BENZOYL PEROXIDE 5 % external wash USE TOPICALLY ONCE DAILY  . calcium-vitamin D (OSCAL WITH D) 500-200 MG-UNIT tablet Take 1 tablet by mouth daily with breakfast.  .  Cholecalciferol (VITAMIN D) 2000 units tablet 1 PO daily  . clindamycin (CLEOCIN T) 1 % external solution Apply topically daily.  . clotrimazole (LOTRIMIN) 1 % cream APPLY TOPICALLY TWICE A DAY AS NEEDED FOR RASH IN SKIN FOLDS  . cyanocobalamin (,VITAMIN B-12,) 1000 MCG/ML injection INJECT 1ML INTRAMUSCULARLY EVERY MONTH  . docusate sodium (COLACE) 100 MG capsule Take 1 capsule (100 mg total) by mouth 2 (two) times daily.  . DULoxetine (CYMBALTA) 30 MG capsule Take 30 mg by mouth daily. 3 tablets daily  . lamoTRIgine (LAMICTAL) 150 MG tablet TAKE 2 TABLETS (300MG ) BY MOUTH TWICE A DAY  . letrozole (FEMARA) 2.5 MG tablet Take 1 tablet (2.5 mg total) by mouth daily.  Marland Kitchen loratadine (CLARITIN) 10 MG tablet Take 1 tablet (10 mg total) by mouth daily.  Marland Kitchen LORazepam (ATIVAN) 1 MG tablet Take 1 tablet (1 mg total) by mouth every 8 (eight) hours as needed (take one tablet by mouth as needed for seizure lasting longer than 3 min.  or more than 2 seizures in 2 hours).  . losartan (COZAAR) 100 MG tablet Take 1 tablet (100 mg total) by mouth daily.  . pantoprazole (PROTONIX) 40 MG tablet Take 1 tablet (40 mg total) by mouth 2 (two) times daily.  Marland Kitchen terbinafine (LAMISIL) 1 % cream Use 2 times a week as needed  . triamcinolone (KENALOG) 0.025 % ointment Apply 1 application topically 2 (two) times daily as needed.  . sodium chloride (OCEAN) 0.65 % SOLN nasal spray Place 2 sprays into both nostrils 2 (two) times daily for 7 days.   No facility-administered encounter medications on file as of 05/08/2017.     Allergies  Allergen Reactions  . Sulfa Antibiotics Other (See Comments)    "Does not know"  . Keflex [Cephalexin] Other (See Comments)    "Does not know" unknown    Review of Systems  Constitutional: Negative for fever and malaise/fatigue.  Respiratory: Negative for cough, shortness of breath and wheezing.   Cardiovascular: Negative for chest pain and leg swelling.  Skin: Positive for itching and rash.     Objective:  BP 110/70 (BP Location: Right Arm, Patient Position: Sitting, Cuff Size: Normal)   Pulse 82   Temp 98 F (36.7 C) (Oral)   Resp 14   Wt 127 lb (57.6 kg)   SpO2 97%   BMI 24.80 kg/m   Physical Exam  Constitutional: She is oriented to person, place, and time and well-developed, well-nourished, and in no distress.  HENT:  Head: Normocephalic and  atraumatic.  Eyes: Conjunctivae are normal.  Neck: No thyromegaly present.  Cardiovascular: Normal rate, regular rhythm and normal heart sounds.  Pulmonary/Chest: Effort normal and breath sounds normal.  Abdominal: Soft.  Neurological: She is alert and oriented to person, place, and time. Gait normal. GCS score is 15.  Skin: Skin is warm and dry.  Thickened long toenails.Mild eczema of posterior scalp and neck.  Psychiatric: Mood, memory, affect and judgment normal.    Assessment and Plan :   1. Onychomycosis Patient needs to have toenails clipped. - Ambulatory referral to Podiatry  2. Eczema, unspecified type Recommend Tar shampoo  3. Callous ulcer, limited to breakdown of skin ([place]) 4.Mental Retardation/Microcephaly  I have done the exam and reviewed the chart and it is accurate to the best of my knowledge. Development worker, community has been used and  any errors in dictation or transcription are unintentional. Miguel Aschoff M.D. [facility]

## 2017-05-08 NOTE — Patient Instructions (Signed)
Use Tar shampoo on scalp (T-Gel)

## 2017-05-22 ENCOUNTER — Ambulatory Visit (INDEPENDENT_AMBULATORY_CARE_PROVIDER_SITE_OTHER): Payer: Medicare Other | Admitting: Podiatry

## 2017-05-22 ENCOUNTER — Encounter: Payer: Self-pay | Admitting: Podiatry

## 2017-05-22 VITALS — BP 120/60 | HR 88

## 2017-05-22 DIAGNOSIS — B351 Tinea unguium: Secondary | ICD-10-CM

## 2017-05-22 DIAGNOSIS — M79675 Pain in left toe(s): Secondary | ICD-10-CM

## 2017-05-22 DIAGNOSIS — M79674 Pain in right toe(s): Secondary | ICD-10-CM | POA: Diagnosis not present

## 2017-05-22 NOTE — Progress Notes (Signed)
Complaint:  Visit Type: Patient returns to my office for continued preventative foot care services. Complaint: Patient states" my nails have grown long and thick and become painful to walk and wear shoes" Patient has been diagnosed with CP and mental retardation.. The patient presents for preventative foot care services. No changes to ROS  Podiatric Exam: Vascular: dorsalis pedis and posterior tibial pulses are palpable bilateral. Capillary return is immediate. Temperature gradient is WNL. Skin turgor WNL  Sensorium: deferred Nail Exam: Pt has thick disfigured discolored nails with subungual debris noted bilateral entire nail hallux through fifth toenails Ulcer Exam: There is no evidence of ulcer or pre-ulcerative changes or infection. Orthopedic Exam: Muscle tone and strength are WNL. No limitations in general ROM. No crepitus or effusions noted. Foot type and digits show no abnormalities. Bony prominences are unremarkable. Skin: No Porokeratosis. No infection or ulcers  Diagnosis:  Onychomycosis, , Pain in right toe, pain in left toes  Treatment & Plan Procedures and Treatment: Consent by patient was obtained for treatment procedures.   Debridement of mycotic and hypertrophic toenails, 1 through 5 bilateral and clearing of subungual debris. No ulceration, no infection noted. Nails were cut short using the nipper but I was unable to use the dremel tool due to her continuous motion. Return Visit-Office Procedure: Patient instructed to return to the office for a follow up visit 3 months for continued evaluation and treatment.    Gardiner Barefoot DPM

## 2017-06-13 DIAGNOSIS — R569 Unspecified convulsions: Secondary | ICD-10-CM | POA: Diagnosis not present

## 2017-06-13 DIAGNOSIS — R2689 Other abnormalities of gait and mobility: Secondary | ICD-10-CM | POA: Diagnosis not present

## 2017-06-13 DIAGNOSIS — Z79899 Other long term (current) drug therapy: Secondary | ICD-10-CM | POA: Diagnosis not present

## 2017-06-13 DIAGNOSIS — M79641 Pain in right hand: Secondary | ICD-10-CM | POA: Diagnosis not present

## 2017-06-13 DIAGNOSIS — F411 Generalized anxiety disorder: Secondary | ICD-10-CM | POA: Diagnosis not present

## 2017-06-13 DIAGNOSIS — M25531 Pain in right wrist: Secondary | ICD-10-CM | POA: Diagnosis not present

## 2017-06-30 ENCOUNTER — Ambulatory Visit: Payer: Self-pay | Admitting: Family Medicine

## 2017-07-03 ENCOUNTER — Ambulatory Visit (INDEPENDENT_AMBULATORY_CARE_PROVIDER_SITE_OTHER): Payer: Medicare Other | Admitting: Family Medicine

## 2017-07-03 VITALS — BP 122/70 | HR 90 | Temp 98.2°F | Resp 16 | Wt 129.0 lb

## 2017-07-03 DIAGNOSIS — I10 Essential (primary) hypertension: Secondary | ICD-10-CM | POA: Diagnosis not present

## 2017-07-03 NOTE — Progress Notes (Signed)
Helen Patton  MRN: [id number] DOB: 08-Apr-1960  Subjective:  HPI   The patient is a 57 year old female who presents today with her caregiver to have Fl2 forms filled out.    Patient Active Problem List   Diagnosis Date Noted  . B12 deficiency 05/17/2016  . S/P total hip arthroplasty 08/23/2015  . Acute respiratory failure with hypoxia ([place]) 05/19/2015  . Avascular necrosis of femoral head ([place]) 03/07/2015  . Closed fracture of neck of right femur ([place]) 03/07/2015  . Anxiety 12/14/2014  . Arthralgia of hip 12/14/2014  . Athlete's foot 12/14/2014  . Cerebral palsy ([place]) 12/14/2014  . Cognitive decline 12/14/2014  . Clinical depression 12/14/2014  . Acid reflux 12/14/2014  . HLD (hyperlipidemia) 12/14/2014  . BP (high blood pressure) 12/14/2014  . Methicillin resistant Staphylococcus aureus infection 12/14/2014  . Adiposity 12/14/2014  . Breast cancer ([place]) 11/21/2013  . Mental retardation 11/16/2013  . Seizure ([place]) 09/30/2013    Past Medical History:  Diagnosis Date  . Anxiety   . Arthritis    osteoarthritis  . Breast cancer ([place]) 2015   [name]  . Cancer ([place]) 11/2013   T1c, Nx; ER/ PR +; Her 2 neg invasive mammary carcinoma left breast. Wide excision only based on Tumor Board review.   . Depression   . Difficulty swallowing   . Falls   . GERD (gastroesophageal reflux disease)   . Hidradenitis 2012   MRSA, on chronic topical suppression  . History of hiatal hernia   . Hyperlipidemia   . Hypertension   . Mental retardation   . MRSA (methicillin resistant staph aureus) culture positive 2012  . Osteoporosis   . Pneumonia    aspiration pneumonia  . Seizures [facility])    last seizure February 2017    Social History   Socioeconomic History  . Marital status: Single    Spouse name: Not on file  . Number of children: Not on file  . Years of education: Not on file  . Highest education level: Not on file  Occupational History  . Not on file    Social Needs  . Financial resource strain: Not on file  . Food insecurity:    Worry: Not on file    Inability: Not on file  . Transportation needs:    Medical: Not on file    Non-medical: Not on file  Tobacco Use  . Smoking status: Never Smoker  . Smokeless tobacco: Never Used  Substance and Sexual Activity  . Alcohol use: No  . Drug use: No  . Sexual activity: Never  Lifestyle  . Physical activity:    Days per week: Not on file    Minutes per session: Not on file  . Stress: Not on file  Relationships  . Social connections:    Talks on phone: Not on file    Gets together: Not on file    Attends religious service: Not on file    Active member of club or organization: Not on file    Attends meetings of clubs or organizations: Not on file    Relationship status: Not on file  . Intimate partner violence:    Fear of current or ex partner: Not on file    Emotionally abused: Not on file    Physically abused: Not on file    Forced sexual activity: Not on file  Other Topics Concern  . Not on file  Social History Narrative  . Not on file  Outpatient Encounter Medications as of 07/03/2017  Medication Sig  . acetaminophen (TYLENOL) 325 MG tablet Take 650 mg by mouth every 6 (six) hours as needed.  Marland Kitchen alendronate (FOSAMAX) 70 MG tablet TAKE ONE TABLET BY MOUTH EVERY SUNDAY EARLY MORNING BEFORE FOODS/MEDSWITH WATER. DONT LIE DOWN FOR 30 MINUTES  . amLODipine (NORVASC) 10 MG tablet TAKE 1 TABLET BY MOUTH AT BEDTIME  . BENZOYL PEROXIDE 5 % external wash USE TOPICALLY ONCE DAILY  . calcium-vitamin D (OSCAL WITH D) 500-200 MG-UNIT tablet Take 1 tablet by mouth daily with breakfast.  . Cholecalciferol (VITAMIN D) 2000 units tablet 1 PO daily  . clindamycin (CLEOCIN T) 1 % external solution Apply topically daily.  . clotrimazole (LOTRIMIN) 1 % cream APPLY TOPICALLY TWICE A DAY AS NEEDED FOR RASH IN SKIN FOLDS  . coal tar (NEUTROGENA T/GEL) 0.5 % shampoo Apply topically at bedtime as  needed.  . cyanocobalamin (,VITAMIN B-12,) 1000 MCG/ML injection INJECT 1ML INTRAMUSCULARLY EVERY MONTH  . docusate sodium (COLACE) 100 MG capsule Take 1 capsule (100 mg total) by mouth 2 (two) times daily.  . DULoxetine (CYMBALTA) 30 MG capsule Take 30 mg by mouth daily. 3 tablets daily  . lamoTRIgine (LAMICTAL) 150 MG tablet TAKE 2 TABLETS (300MG ) BY MOUTH TWICE A DAY  . letrozole (FEMARA) 2.5 MG tablet Take 1 tablet (2.5 mg total) by mouth daily.  Marland Kitchen loratadine (CLARITIN) 10 MG tablet Take 1 tablet (10 mg total) by mouth daily.  Marland Kitchen LORazepam (ATIVAN) 1 MG tablet Take 1 tablet (1 mg total) by mouth every 8 (eight) hours as needed (take one tablet by mouth as needed for seizure lasting longer than 3 min.  or more than 2 seizures in 2 hours).  . losartan (COZAAR) 100 MG tablet Take 1 tablet (100 mg total) by mouth daily.  . pantoprazole (PROTONIX) 40 MG tablet Take 1 tablet (40 mg total) by mouth 2 (two) times daily.  Marland Kitchen terbinafine (LAMISIL) 1 % cream Use 2 times a week as needed  . triamcinolone (KENALOG) 0.025 % ointment Apply 1 application topically 2 (two) times daily as needed.  . [DISCONTINUED] sodium chloride (OCEAN) 0.65 % SOLN nasal spray Place 2 sprays into both nostrils 2 (two) times daily for 7 days.   No facility-administered encounter medications on file as of 07/03/2017.     Allergies  Allergen Reactions  . Sulfa Antibiotics Other (See Comments)    "Does not know"  . Keflex [Cephalexin] Other (See Comments)    "Does not know" unknown    Review of Systems  Constitutional: Negative for fever and malaise/fatigue.  HENT: Negative.   Eyes: Negative.   Respiratory: Negative for cough, shortness of breath and wheezing.   Cardiovascular: Negative for chest pain and leg swelling.  Gastrointestinal: Negative.   Musculoskeletal: Positive for back pain, joint pain and neck pain.  Skin: Negative.   Neurological: Positive for sensory change.  Endo/Heme/Allergies: Negative.     Psychiatric/Behavioral: Negative.     Objective:  BP 122/70 (BP Location: Right Arm, Patient Position: Sitting, Cuff Size: Normal)   Pulse 90   Temp 98.2 F (36.8 C) (Oral)   Resp 16   Wt 129 lb (58.5 kg)   BMI 25.19 kg/m   Physical Exam  Constitutional: She is oriented to person, place, and time and well-developed, well-nourished, and in no distress.  HENT:  Head: Normocephalic and atraumatic.  Right Ear: External ear normal.  Left Ear: External ear normal.  Nose: Nose normal.  Eyes: Conjunctivae are  normal. No scleral icterus.  Neck: No thyromegaly present.  Cardiovascular: Normal rate and regular rhythm.  2/6 systolic murmur.  Pulmonary/Chest: Effort normal and breath sounds normal.  Abdominal: Soft.  Musculoskeletal: Normal range of motion.  Increased thoracic kyphosis.  Lymphadenopathy:    She has no cervical adenopathy.  Neurological: She is alert and oriented to person, place, and time. GCS score is 15.    Assessment and Plan :   DDD/OA Lift chair prescribed. HTN  I have done the exam and reviewed the chart and it is accurate to the best of my knowledge. Development worker, community has been used and  any errors in dictation or transcription are unintentional. Miguel Aschoff M.D. [facility]

## 2017-07-09 DIAGNOSIS — Z1211 Encounter for screening for malignant neoplasm of colon: Secondary | ICD-10-CM | POA: Diagnosis not present

## 2017-07-09 DIAGNOSIS — Z1212 Encounter for screening for malignant neoplasm of rectum: Secondary | ICD-10-CM | POA: Diagnosis not present

## 2017-07-10 LAB — COLOGUARD: Cologuard: NEGATIVE

## 2017-07-11 ENCOUNTER — Other Ambulatory Visit: Payer: Self-pay | Admitting: Family Medicine

## 2017-07-11 NOTE — Telephone Encounter (Signed)
Helen Patton Corporate treasurer from [employer] called requesting refills on   1)clindamycin (CLEOCIN T) 1 % external solution 2)terbinafine (LAMISIL) 1 % cream  3)acetaminophen (TYLENOL) 325 MG tablet  Her call back # is [phone]

## 2017-07-11 NOTE — Telephone Encounter (Signed)
Please review. Thanks!  

## 2017-07-14 MED ORDER — TERBINAFINE HCL 1 % EX CREA: TOPICAL_CREAM | CUTANEOUS | 2 refills | Status: DC

## 2017-07-14 MED ORDER — ACETAMINOPHEN 325 MG PO TABS: 650.0000 mg | ORAL_TABLET | Freq: Four times a day (QID) | ORAL | 11 refills | Status: AC | PRN

## 2017-07-14 MED ORDER — CLINDAMYCIN PHOSPHATE 1 % EX SOLN: Freq: Every day | CUTANEOUS | 12 refills | Status: DC

## 2017-07-23 ENCOUNTER — Telehealth: Payer: Self-pay | Admitting: Family Medicine

## 2017-07-23 LAB — COLOGUARD: Cologuard: NEGATIVE

## 2017-07-23 NOTE — Telephone Encounter (Signed)
[name] called saying pt has two medications that she is taking that they need orders on   Fosamax 70mg  once a week  Clotrimazole cream as needed for rash and skin folds  Fax is [phone]  Thanks [name]

## 2017-07-23 NOTE — Telephone Encounter (Signed)
[id number] with [name] is requesting orders/Rx for the following medications:  1. acetaminophen (TYLENOL) 325 MG tablet 2. alendronate (FOSAMAX) 70 MG tablet 3. clotrimazole (LOTRIMIN) 1 % cream   Fax# [phone] Please advise. Thanks TNP

## 2017-07-23 NOTE — Telephone Encounter (Signed)
Orders written and faxed below.

## 2017-07-24 ENCOUNTER — Telehealth: Payer: Self-pay | Admitting: Family Medicine

## 2017-07-24 NOTE — Telephone Encounter (Signed)
Helen Patton saying they need a DC order for the every 4 hours of the tylenol  She needs a written order [facility]ed [facility] [phone]  Thanks Pierce

## 2017-07-24 NOTE — Telephone Encounter (Signed)
Ok thx.

## 2017-07-24 NOTE — Telephone Encounter (Signed)
Order written for you to sign. Thanks!

## 2017-07-25 ENCOUNTER — Other Ambulatory Visit: Payer: Self-pay | Admitting: Family Medicine

## 2017-07-25 NOTE — Telephone Encounter (Signed)
[facility] faxed a refill request for the following medication. Thanks CC  lamoTRIgine (LAMICTAL) 150 MG tablet

## 2017-07-28 ENCOUNTER — Telehealth: Payer: Self-pay | Admitting: Family Medicine

## 2017-07-28 NOTE — Telephone Encounter (Signed)
Can this be PRN instead of daily

## 2017-07-28 NOTE — Telephone Encounter (Signed)
[facility]en [facility] caregiver with [facility] is requesting written orders to change pt's clindamycin (CLEOCIN T) 1 % external solution to PRN. Fax# [phone]. Please advise. Thanks TNP

## 2017-07-29 NOTE — Telephone Encounter (Signed)
done

## 2017-07-29 NOTE — Telephone Encounter (Signed)
ok 

## 2017-08-07 ENCOUNTER — Ambulatory Visit: Payer: Self-pay | Admitting: Family Medicine

## 2017-08-12 ENCOUNTER — Other Ambulatory Visit: Payer: Self-pay | Admitting: Family Medicine

## 2017-08-12 DIAGNOSIS — I10 Essential (primary) hypertension: Secondary | ICD-10-CM

## 2017-08-13 ENCOUNTER — Other Ambulatory Visit: Payer: Self-pay | Admitting: Family Medicine

## 2017-08-18 ENCOUNTER — Other Ambulatory Visit: Payer: Self-pay | Admitting: Family Medicine

## 2017-08-21 ENCOUNTER — Ambulatory Visit (INDEPENDENT_AMBULATORY_CARE_PROVIDER_SITE_OTHER): Payer: Medicare Other | Admitting: Podiatry

## 2017-08-21 ENCOUNTER — Encounter: Payer: Self-pay | Admitting: Podiatry

## 2017-08-21 DIAGNOSIS — M79674 Pain in right toe(s): Secondary | ICD-10-CM

## 2017-08-21 DIAGNOSIS — B351 Tinea unguium: Secondary | ICD-10-CM | POA: Diagnosis not present

## 2017-08-21 DIAGNOSIS — M79675 Pain in left toe(s): Secondary | ICD-10-CM | POA: Diagnosis not present

## 2017-08-21 NOTE — Progress Notes (Addendum)
Complaint:  Visit Type: Patient returns to my office for continued preventative foot care services. Complaint: Patient states" my nails have grown long and thick and become painful to walk and wear shoes" Patient has been diagnosed with CP and mental retardation.. The patient presents for preventative foot care services. No changes to ROS  Podiatric Exam: Vascular: dorsalis pedis and posterior tibial pulses are palpable bilateral. Capillary return is immediate. Temperature gradient is WNL. Skin turgor WNL  Sensorium: deferred Nail Exam: Pt has thick disfigured discolored nails with subungual debris noted bilateral entire nail hallux through fifth toenails Ulcer Exam: There is no evidence of ulcer or pre-ulcerative changes or infection. Orthopedic Exam: Muscle tone and strength are WNL. No limitations in general ROM. No crepitus or effusions noted. Foot type and digits show no abnormalities. Bony prominences are unremarkable. Skin: No Porokeratosis. No infection or ulcers  Diagnosis:  Onychomycosis, , Pain in right toe, pain in left toes  Treatment & Plan Procedures and Treatment: Consent by patient was obtained for treatment procedures.   Debridement of mycotic and hypertrophic toenails, 1 through 5 bilateral and clearing of subungual debris. No ulceration, no infection noted. Nails were cut short using the nipper but I was unable to use the dremel tool due to her continuous motion. ABN signed for 2019.  Return Visit-Office Procedure: Patient instructed to return to the office for a follow up visit 4 months for continued evaluation and treatment.    Gardiner Barefoot DPM

## 2017-08-27 ENCOUNTER — Telehealth: Payer: Self-pay

## 2017-08-27 NOTE — Telephone Encounter (Signed)
Tried calling patient, no answer. Will try again later.  

## 2017-08-27 NOTE — Telephone Encounter (Signed)
-----   Message from Jerrol Banana., MD sent at 08/25/2017  1:50 PM EDT ----- Cologard negative--possibly repeat 3 years.

## 2017-09-04 ENCOUNTER — Other Ambulatory Visit: Payer: Self-pay | Admitting: Family Medicine

## 2017-09-08 ENCOUNTER — Other Ambulatory Visit: Payer: Self-pay | Admitting: Family Medicine

## 2017-09-16 DIAGNOSIS — F339 Major depressive disorder, recurrent, unspecified: Secondary | ICD-10-CM | POA: Diagnosis not present

## 2017-09-19 ENCOUNTER — Other Ambulatory Visit: Payer: Self-pay | Admitting: Family Medicine

## 2017-11-05 ENCOUNTER — Telehealth: Payer: Self-pay | Admitting: Family Medicine

## 2017-11-05 NOTE — Telephone Encounter (Signed)
Teaspoon of generic robitussin twice a day.

## 2017-11-05 NOTE — Telephone Encounter (Signed)
Left message to call back  

## 2017-11-05 NOTE — Telephone Encounter (Signed)
Please review. Thanks!  

## 2017-11-05 NOTE — Telephone Encounter (Signed)
Pt's caregiver, Verdene Lennert called stating pt has had 2 days of a runny nose, cough from chest.  Pt doesn't like to cough or blow her nose. The mucus is staying in.  Please call caregiver back to let her know what she needs to give her or to do to help pt. She said to leave a message on the voicemail if no answer.  Thanks, [employer]

## 2017-11-06 ENCOUNTER — Ambulatory Visit (INDEPENDENT_AMBULATORY_CARE_PROVIDER_SITE_OTHER): Payer: Medicare Other | Admitting: Family Medicine

## 2017-11-06 VITALS — BP 138/58 | HR 91 | Temp 98.4°F | Resp 14 | Wt 136.0 lb

## 2017-11-06 DIAGNOSIS — J42 Unspecified chronic bronchitis: Secondary | ICD-10-CM | POA: Diagnosis not present

## 2017-11-06 MED ORDER — AZITHROMYCIN 250 MG PO TABS
ORAL_TABLET | ORAL | 0 refills | Status: DC
Start: 2017-11-06 — End: 2018-01-01

## 2017-11-06 NOTE — Progress Notes (Signed)
Helen Patton  MRN: [id number] DOB: Feb 17, 1960  Subjective:  HPI   The patient is a 57 year old female with symptoms of cough, runny nose and fever yesterday.  She is here today with her caregiver who has been off of work for the last 5 days and is uncertain how long the symptoms have been going on.   The patient is coughing up phlegm but unable to spit it out to see if it has color.   The caregiver states she had some fever yesterday but is not certain of how high it got.   Patient Active Problem List   Diagnosis Date Noted  . B12 deficiency 05/17/2016  . S/P total hip arthroplasty 08/23/2015  . Acute respiratory failure with hypoxia ([place]) 05/19/2015  . Avascular necrosis of femoral head ([place]) 03/07/2015  . Closed fracture of neck of right femur ([place]) 03/07/2015  . Anxiety 12/14/2014  . Arthralgia of hip 12/14/2014  . Athlete's foot 12/14/2014  . Cerebral palsy ([place]) 12/14/2014  . Cognitive decline 12/14/2014  . Clinical depression 12/14/2014  . Acid reflux 12/14/2014  . HLD (hyperlipidemia) 12/14/2014  . BP (high blood pressure) 12/14/2014  . Methicillin resistant Staphylococcus aureus infection 12/14/2014  . Adiposity 12/14/2014  . Breast cancer ([place]) 11/21/2013  . Mental retardation 11/16/2013  . Seizure ([place]) 09/30/2013    Past Medical History:  Diagnosis Date  . Anxiety   . Arthritis    osteoarthritis  . Breast cancer ([place]) 2015   [name]  . Cancer ([place]) 11/2013   T1c, Nx; ER/ PR +; Her 2 neg invasive mammary carcinoma left breast. Wide excision only based on Tumor Board review.   . Depression   . Difficulty swallowing   . Falls   . GERD (gastroesophageal reflux disease)   . Hidradenitis 2012   MRSA, on chronic topical suppression  . History of hiatal hernia   . Hyperlipidemia   . Hypertension   . Mental retardation   . MRSA (methicillin resistant staph aureus) culture positive 2012  . Osteoporosis   . Pneumonia    aspiration pneumonia   . Seizures [facility])    last seizure February 2017    Social History   Socioeconomic History  . Marital status: Single    Spouse name: Not on file  . Number of children: Not on file  . Years of education: Not on file  . Highest education level: Not on file  Occupational History  . Not on file  Social Needs  . Financial resource strain: Not on file  . Food insecurity:    Worry: Not on file    Inability: Not on file  . Transportation needs:    Medical: Not on file    Non-medical: Not on file  Tobacco Use  . Smoking status: Never Smoker  . Smokeless tobacco: Never Used  Substance and Sexual Activity  . Alcohol use: No  . Drug use: No  . Sexual activity: Never  Lifestyle  . Physical activity:    Days per week: Not on file    Minutes per session: Not on file  . Stress: Not on file  Relationships  . Social connections:    Talks on phone: Not on file    Gets together: Not on file    Attends religious service: Not on file    Active member of club or organization: Not on file    Attends meetings of clubs or organizations: Not on file    Relationship  status: Not on file  . Intimate partner violence:    Fear of current or ex partner: Not on file    Emotionally abused: Not on file    Physically abused: Not on file    Forced sexual activity: Not on file  Other Topics Concern  . Not on file  Social History Narrative  . Not on file    Outpatient Encounter Medications as of 11/06/2017  Medication Sig  . acetaminophen (TYLENOL) 325 MG tablet Take 2 tablets (650 mg total) by mouth every 6 (six) hours as needed.  Marland Kitchen alendronate (FOSAMAX) 70 MG tablet TAKE 1 TABLET  BY MOUTH WEEKLY EARLY MORNING BEFORE FOOD/MEDS WITH WATER. DO NOT LIE DOWN FOR 30 MINUTES  . ALLERGY 10 MG tablet TAKE 1 TABLET BY MOUTH EACH DAY  . amLODipine (NORVASC) 10 MG tablet TAKE 1 TABLET BY MOUTH AT BEDTIME  . BENZOYL PEROXIDE 5 % external wash USE TOPICALLY ONCE DAILY  . calcium-vitamin D (OSCAL WITH D)  500-200 MG-UNIT TABS tablet TAKE 1 TABLET BY MOUTH EVERY DAY WITH BREAKFAST  . Cholecalciferol (VITAMIN D) 2000 units tablet TAKE 1 TABLET BY MOUTH EACH DAY  . clindamycin (CLEOCIN T) 1 % external solution Apply topically daily.  . clotrimazole (LOTRIMIN) 1 % cream APPLY TOPICALLY TWICE A DAY AS NEEDED FOR RASH IN SKIN FOLDS  . coal tar (NEUTROGENA T/GEL) 0.5 % shampoo Apply topically at bedtime as needed.  . cyanocobalamin (,VITAMIN B-12,) 1000 MCG/ML injection GIVE 1000 MCG INJECTION "IM" ONCE A MONTH (B-12 SUPPLEMENT)  . docusate sodium (COLACE) 100 MG capsule TAKE 1 CAPSULE BY MOUTH 2 TIMES PER DAY  . DULoxetine (CYMBALTA) 30 MG capsule TAKE 3 CAPSULES BY MOUTH EVERY DAY  . lamoTRIgine (LAMICTAL) 150 MG tablet TAKE 2 TABLETS BY MOUTH 2 TIMES PER DAY  . letrozole (FEMARA) 2.5 MG tablet TAKE 1 TABLET BY MOUTH EVERY DAY  . LORazepam (ATIVAN) 1 MG tablet TAKE 1 TABLET BY MOUTH EACH MORNING  . losartan (COZAAR) 100 MG tablet TAKE 1 TABLET BY MOUTH EVERY DAY  . pantoprazole (PROTONIX) 40 MG tablet TAKE 1 TABLET BY MOUTH 2 TIMES PER DAY **DO NOT CRUSH**  . terbinafine (LAMISIL) 1 % cream Use 2 times a week as needed  . triamcinolone (KENALOG) 0.025 % ointment Apply 1 application topically 2 (two) times daily as needed.   No facility-administered encounter medications on file as of 11/06/2017.     Allergies  Allergen Reactions  . Sulfa Antibiotics Other (See Comments)    "Does not know"  . Keflex [Cephalexin] Other (See Comments)    "Does not know" unknown    Review of Systems  Constitutional: Positive for fever and malaise/fatigue.  HENT: Positive for congestion and sore throat. Negative for hearing loss and tinnitus.   Eyes: Negative.   Respiratory: Positive for cough and sputum production. Negative for shortness of breath.   Cardiovascular: Negative.   Gastrointestinal: Negative.   Endo/Heme/Allergies: Negative.   Psychiatric/Behavioral: Negative.     Objective:  BP (!) 138/58  (BP Location: Right Arm, Patient Position: Sitting, Cuff Size: Normal)   Pulse 91   Temp 98.4 F (36.9 C) (Oral)   Resp 14   Wt 136 lb (61.7 kg)   SpO2 95%   BMI 26.56 kg/m   Physical Exam  Constitutional: She is oriented to person, place, and time and well-developed, well-nourished, and in no distress.  HENT:  Head: Normocephalic and atraumatic.  Right Ear: External ear normal.  Left Ear: External ear  normal.  Nose: Nose normal.  Eyes: Conjunctivae are normal.  Neck: No thyromegaly present.  Cardiovascular: Normal rate, regular rhythm and normal heart sounds.  Pulmonary/Chest: Effort normal and breath sounds normal.  Scattered rhonchi.  Abdominal: Soft.  Musculoskeletal: She exhibits no edema.  Neurological: She is alert and oriented to person, place, and time. Gait normal. GCS score is 15.  Skin: Skin is warm and dry.  Psychiatric: Mood and affect normal.    Assessment and Plan :  1. Chronic bronchitis, unspecified chronic bronchitis type (HCC)  - azithromycin (ZITHROMAX) 250 MG tablet; 2 PO day 1 and then 1 daily  Dispense: 6 tablet; Refill: 0 2.Microcephaly  I have done the exam and reviewed the chart and it is accurate to the best of my knowledge. Development worker, community has been used and  any errors in dictation or transcription are unintentional. Miguel Aschoff M.D. [facility]

## 2017-11-27 ENCOUNTER — Ambulatory Visit: Payer: Medicare Other | Admitting: Podiatry

## 2017-12-08 ENCOUNTER — Ambulatory Visit (INDEPENDENT_AMBULATORY_CARE_PROVIDER_SITE_OTHER): Payer: Medicare Other | Admitting: Podiatry

## 2017-12-08 ENCOUNTER — Encounter: Payer: Self-pay | Admitting: Podiatry

## 2017-12-08 DIAGNOSIS — M79674 Pain in right toe(s): Secondary | ICD-10-CM

## 2017-12-08 DIAGNOSIS — M79675 Pain in left toe(s): Secondary | ICD-10-CM

## 2017-12-08 DIAGNOSIS — B351 Tinea unguium: Secondary | ICD-10-CM

## 2017-12-08 NOTE — Progress Notes (Signed)
Complaint:  Visit Type: Patient returns to my office for continued preventative foot care services. Complaint: Patient states" my nails have grown long and thick and become painful to walk and wear shoes" Patient has been diagnosed with CP and mental retardation.. The patient presents for preventative foot care services. No changes to ROS.  Patient is diabetic.  Podiatric Exam: Vascular: dorsalis pedis and posterior tibial pulses are palpable bilateral. Capillary return is immediate. Temperature gradient is WNL. Skin turgor WNL  Sensorium: deferred Nail Exam: Pt has thick disfigured discolored nails with subungual debris noted bilateral entire nail hallux through fifth toenails Ulcer Exam: There is no evidence of ulcer or pre-ulcerative changes or infection. Orthopedic Exam: Muscle tone and strength are WNL. No limitations in general ROM. No crepitus or effusions noted. Foot type and digits show no abnormalities. Bony prominences are unremarkable. Skin: No Porokeratosis. No infection or ulcers  Diagnosis:  Onychomycosis, , Pain in right toe, pain in left toes  Treatment & Plan Procedures and Treatment: Consent by patient was obtained for treatment procedures.   Debridement of mycotic and hypertrophic toenails, 1 through 5 bilateral and clearing of subungual debris. No ulceration, no infection noted. Nails were cut short using the nipper but I was unable to use the dremel tool due to her continuous motion. ABN signed for 2019.  Return Visit-Office Procedure: Patient instructed to return to the office for a follow up visit 4 months for continued evaluation and treatment.    Gardiner Barefoot DPM

## 2017-12-09 ENCOUNTER — Other Ambulatory Visit: Payer: Self-pay | Admitting: Family Medicine

## 2017-12-24 ENCOUNTER — Other Ambulatory Visit: Payer: Self-pay

## 2017-12-24 ENCOUNTER — Encounter: Payer: Self-pay | Admitting: Family Medicine

## 2017-12-24 ENCOUNTER — Ambulatory Visit (INDEPENDENT_AMBULATORY_CARE_PROVIDER_SITE_OTHER): Payer: Medicare Other | Admitting: Family Medicine

## 2017-12-24 VITALS — BP 132/70 | HR 85 | Temp 98.1°F | Ht 62.0 in | Wt 132.6 lb

## 2017-12-24 DIAGNOSIS — R350 Frequency of micturition: Secondary | ICD-10-CM

## 2017-12-24 DIAGNOSIS — N3001 Acute cystitis with hematuria: Secondary | ICD-10-CM

## 2017-12-24 DIAGNOSIS — T7840XD Allergy, unspecified, subsequent encounter: Secondary | ICD-10-CM | POA: Diagnosis not present

## 2017-12-24 LAB — POCT URINALYSIS DIPSTICK
BILIRUBIN UA: NEGATIVE
GLUCOSE UA: NEGATIVE
KETONES UA: NEGATIVE
Nitrite, UA: NEGATIVE
Protein, UA: NEGATIVE
Spec Grav, UA: 1.02 (ref 1.010–1.025)
UROBILINOGEN UA: 0.2 U/dL
pH, UA: 6 (ref 5.0–8.0)

## 2017-12-24 MED ORDER — DOXYCYCLINE HYCLATE 100 MG PO TABS: 100.0000 mg | ORAL_TABLET | Freq: Two times a day (BID) | ORAL | 0 refills | Status: DC

## 2017-12-24 MED ORDER — CETIRIZINE HCL 10 MG PO TABS: 10.0000 mg | ORAL_TABLET | Freq: Every day | ORAL | 11 refills | Status: DC

## 2017-12-24 NOTE — Progress Notes (Signed)
Patient: Helen Patton Female    DOB: 1960/09/24   57 y.o.   MRN: [id number] Visit Date: 12/24/2017  Today's Provider: Wilhemena Durie, MD   Chief Complaint  Patient presents with  . nasal drainage    since 12/10/17  . urine frequency    since 12/10/17   Subjective:    HPI  Pt's caregiver reports she is having a lot of clear drainage coming from nose for about 2 weeks (01/10/18) now and also she is having urine frequency(01/10/18).  Overall she feels ok.    Allergies  Allergen Reactions  . Sulfa Antibiotics Other (See Comments)    "Does not know"  . Keflex [Cephalexin] Other (See Comments)    "Does not know" unknown     Current Outpatient Medications:  .  acetaminophen (TYLENOL) 325 MG tablet, Take 2 tablets (650 mg total) by mouth every 6 (six) hours as needed., Disp: 100 tablet, Rfl: 11 .  alendronate (FOSAMAX) 70 MG tablet, TAKE 1 TABLET  BY MOUTH WEEKLY EARLY MORNING BEFORE FOOD/MEDS WITH WATER. DO NOT LIE DOWN FOR 30 MINUTES, Disp: 4 tablet, Rfl: 12 .  ALLERGY 10 MG tablet, TAKE 1 TABLET BY MOUTH EACH DAY, Disp: 30 tablet, Rfl: 12 .  amLODipine (NORVASC) 10 MG tablet, TAKE 1 TABLET BY MOUTH AT BEDTIME, Disp: 30 tablet, Rfl: 12 .  azithromycin (ZITHROMAX) 250 MG tablet, 2 PO day 1 and then 1 daily, Disp: 6 tablet, Rfl: 0 .  BENZOYL PEROXIDE 5 % external wash, USE TOPICALLY ONCE DAILY, Disp: 237 g, Rfl: 0 .  BENZOYL PEROXIDE-CLEANSER EX, benzoyl peroxide, Disp: , Rfl:  .  calcium-vitamin D (OSCAL WITH D) 500-200 MG-UNIT TABS tablet, TAKE 1 TABLET BY MOUTH EVERY DAY WITH BREAKFAST, Disp: 30 each, Rfl: 11 .  Cholecalciferol (VITAMIN D) 2000 units tablet, TAKE 1 TABLET BY MOUTH EACH DAY, Disp: 30 tablet, Rfl: 12 .  clindamycin (CLEOCIN T) 1 % external solution, Apply topically daily., Disp: 60 mL, Rfl: 12 .  clotrimazole (LOTRIMIN) 1 % cream, APPLY TOPICALLY TWICE A DAY AS NEEDED FOR RASH IN SKIN FOLDS, Disp: 30 g, Rfl: 0 .  coal tar (NEUTROGENA T/GEL) 0.5 %  shampoo, Apply topically at bedtime as needed., Disp: 240 mL, Rfl: 12 .  cyanocobalamin (,VITAMIN B-12,) 1000 MCG/ML injection, GIVE 1000 MCG INJECTION "IM" ONCE A MONTH (B-12 SUPPLEMENT), Disp: 1 mL, Rfl: 11 .  docusate sodium (COLACE) 100 MG capsule, TAKE 1 CAPSULE BY MOUTH 2 TIMES PER DAY, Disp: 60 capsule, Rfl: 12 .  DULoxetine (CYMBALTA) 30 MG capsule, TAKE 3 CAPSULES BY MOUTH EVERY DAY, Disp: 90 capsule, Rfl: 11 .  lamoTRIgine (LAMICTAL) 150 MG tablet, TAKE 2 TABLETS BY MOUTH 2 TIMES PER DAY, Disp: 120 tablet, Rfl: 0 .  letrozole (FEMARA) 2.5 MG tablet, TAKE 1 TABLET BY MOUTH EVERY DAY, Disp: 30 tablet, Rfl: 12 .  LORazepam (ATIVAN) 1 MG tablet, TAKE 1 TAB BY MOUTH EVERY MORNING, 1/2 TAB AT NOON AND AT BEDTIME. TAKE 1 TAB FOR SEIZURES >3 MIN OR MORE THAN 2 IN 2H, Disp: 90 tablet, Rfl: 4 .  losartan (COZAAR) 100 MG tablet, TAKE 1 TABLET BY MOUTH EVERY DAY, Disp: 30 tablet, Rfl: 11 .  meloxicam (MOBIC) 7.5 MG tablet, meloxicam 7.5 mg tablet, Disp: , Rfl:  .  omeprazole (PRILOSEC) 20 MG capsule, omeprazole 20 mg capsule,delayed release, Disp: , Rfl:  .  pantoprazole (PROTONIX) 40 MG tablet, TAKE 1 TABLET BY MOUTH 2 TIMES PER DAY **  DO NOT CRUSH**, Disp: 60 tablet, Rfl: 11 .  terbinafine (LAMISIL) 1 % cream, Use 2 times a week as needed, Disp: 30 g, Rfl: 2 .  triamcinolone (KENALOG) 0.025 % ointment, Apply 1 application topically 2 (two) times daily as needed., Disp: , Rfl:   Review of Systems  Constitutional: Negative.   HENT: Positive for rhinorrhea (clear nasal drainage since 12/10/17).   Eyes: Negative.   Respiratory: Negative.   Cardiovascular: Negative.   Gastrointestinal: Negative.   Endocrine: Negative.   Genitourinary: Positive for frequency (since 12/10/17).  Musculoskeletal: Negative.   Skin: Negative.   Allergic/Immunologic: Negative.   Neurological: Negative.   Hematological: Negative.   Psychiatric/Behavioral: Negative.     Social History   Tobacco Use  . Smoking  status: Never Smoker  . Smokeless tobacco: Never Used  Substance Use Topics  . Alcohol use: No   Objective:   Ht 5\' 2"  (1.575 m)   Wt 132 lb 9.6 oz (60.1 kg)   BMI 24.25 kg/m  Vitals:   12/24/17 1636  Weight: 132 lb 9.6 oz (60.1 kg)  Height: 5\' 2"  (1.575 m)     Physical Exam  Constitutional: She appears well-developed.  HENT:  Head: Atraumatic.  Eyes: No scleral icterus.  Neck: No thyromegaly present.  Cardiovascular: Normal rate, regular rhythm and normal heart sounds.  Pulmonary/Chest: Effort normal and breath sounds normal.        Assessment & Plan:     1. Acute cystitis with hematuria Doxy 100 mg BID X 5 days  2. Urine frequency  - POCT Urinalysis Dipstick - Urine Culture  3. Allergic state, subsequent encounter Stop Claritin and start Zyrtec 10 mg daily      I have done the exam and reviewed the chart and it is accurate to the best of my knowledge. Development worker, community has been used and  any errors in dictation or transcription are unintentional. Miguel Aschoff M.D. [place], MD  [facility]

## 2017-12-25 DIAGNOSIS — E559 Vitamin D deficiency, unspecified: Secondary | ICD-10-CM | POA: Diagnosis not present

## 2017-12-25 DIAGNOSIS — R569 Unspecified convulsions: Secondary | ICD-10-CM | POA: Diagnosis not present

## 2017-12-25 DIAGNOSIS — E538 Deficiency of other specified B group vitamins: Secondary | ICD-10-CM | POA: Diagnosis not present

## 2017-12-26 LAB — URINE CULTURE

## 2017-12-30 ENCOUNTER — Ambulatory Visit: Payer: Self-pay | Admitting: Family Medicine

## 2018-01-01 ENCOUNTER — Ambulatory Visit (INDEPENDENT_AMBULATORY_CARE_PROVIDER_SITE_OTHER): Payer: Medicare Other | Admitting: Physician Assistant

## 2018-01-01 ENCOUNTER — Encounter: Payer: Self-pay | Admitting: Physician Assistant

## 2018-01-01 VITALS — BP 110/60 | HR 78 | Temp 97.8°F | Resp 16 | Wt 132.0 lb

## 2018-01-01 DIAGNOSIS — E538 Deficiency of other specified B group vitamins: Secondary | ICD-10-CM

## 2018-01-01 DIAGNOSIS — C50512 Malignant neoplasm of lower-outer quadrant of left female breast: Secondary | ICD-10-CM | POA: Diagnosis not present

## 2018-01-01 DIAGNOSIS — R569 Unspecified convulsions: Secondary | ICD-10-CM | POA: Diagnosis not present

## 2018-01-01 DIAGNOSIS — E785 Hyperlipidemia, unspecified: Secondary | ICD-10-CM

## 2018-01-01 DIAGNOSIS — L089 Local infection of the skin and subcutaneous tissue, unspecified: Secondary | ICD-10-CM

## 2018-01-01 DIAGNOSIS — I10 Essential (primary) hypertension: Secondary | ICD-10-CM

## 2018-01-01 DIAGNOSIS — F419 Anxiety disorder, unspecified: Secondary | ICD-10-CM | POA: Diagnosis not present

## 2018-01-01 DIAGNOSIS — L723 Sebaceous cyst: Secondary | ICD-10-CM

## 2018-01-01 DIAGNOSIS — Z170 Estrogen receptor positive status [ER+]: Secondary | ICD-10-CM | POA: Diagnosis not present

## 2018-01-01 DIAGNOSIS — F329 Major depressive disorder, single episode, unspecified: Secondary | ICD-10-CM | POA: Diagnosis not present

## 2018-01-01 DIAGNOSIS — F32A Depression, unspecified: Secondary | ICD-10-CM

## 2018-01-01 MED ORDER — DOXYCYCLINE HYCLATE 100 MG PO TABS: 100.0000 mg | ORAL_TABLET | Freq: Two times a day (BID) | ORAL | 0 refills | Status: DC

## 2018-01-01 NOTE — Patient Instructions (Signed)
Please take antibiotics for the infected cyst and use warm heat to help drainage. If it does not improve, we will have you see the surgeon, Dr. Bary Castilla     Epidermal Cyst An epidermal cyst is a small, painless lump under your skin. It may be called an epidermal inclusion cyst or an infundibular cyst. The cyst contains a grayish-white, bad-smelling substance (keratin). It is important not to pop epidermal cysts yourself. These cysts are usually harmless (benign), but they can get infected. Symptoms of infection may include:  Redness.  Inflammation.  Tenderness.  Warmth.  Fever.  A grayish-white, bad-smelling substance draining from the cyst.  Pus draining from the cyst.  Follow these instructions at home:  Take over-the-counter and prescription medicines only as told by your doctor.  If you were prescribed an antibiotic, use it as told by your doctor. Do not stop using the antibiotic even if you start to feel better.  Keep the area around your cyst clean and dry.  Wear loose, dry clothing.  Do not try to pop your cyst.  Avoid touching your cyst.  Check your cyst every day for signs of infection.  Keep all follow-up visits as told by your doctor. This is important. How is this prevented?  Wear clean, dry, clothing.  Avoid wearing tight clothing.  Keep your skin clean and dry. Shower or take baths every day.  Wash your body with a benzoyl peroxide wash when you shower or bathe. Contact a health care provider if:  Your cyst has symptoms of infection.  Your condition is not improving or is getting worse.  You have a cyst that looks different from other cysts you have had.  You have a fever. Get help right away if:  Redness spreads from the cyst into the surrounding area. This information is not intended to replace advice given to you by your health care provider. Make sure you discuss any questions you have with your health care provider. Document Released:  03/07/2004 Document Revised: 09/27/2015 Document Reviewed: 11/30/2014 Elsevier Interactive Patient Education  [employer].

## 2018-01-01 NOTE — Progress Notes (Signed)
Established Patient Office Visit  Subjective:  Patient ID: Helen Patton, female    DOB: November 25, 1960  Age: 57 y.o. MRN: [id number]  CC:  Chief Complaint  Patient presents with  . Follow-up    6 motnh fu    HPI [name] presents for six month follow up on hypertension, patient was seen in May, 2019 by PCP Dr. Rosanna Randy. Patient is here today with her 3rd shirt nurse aid Charmain. She has a diagnosis of cerebral palsy and is minimally communicative. Charmain reports patient has a clear runny nose x's a few weeks.   HTN: Currently controlled on amlodipine 10 mg daily and losartan 100 mg daily.   HLD: Does not currently take a statin.   Lipid Panel:     Component Value Date/Time   CHOL 276 (H) 11/06/2016 0905   TRIG 62 11/06/2016 0905   HDL 109 11/06/2016 0905   CHOLHDL 2.5 11/06/2016 0905   LDLCALC 152 (H) 11/06/2016 0905   Clinical Depression and Anziety: Currently taking Cymbalta 30 mg daily. She also has a script for Ativan that can be used for both anxiety and seizures.   Seizure disorder: This is followed by Dr. Manuella Ghazi at [facility]. She is currently on Lamictal 150 mg in the morning and 300 mg in the evening.   History of Breast Cancer: She has a history of breast cancer and underwent left breast wide excision on 12/07/2017. She is currently on femara.  Infected Sebaceous Cyst: She was treated for this 04/2016 by another provider in this clinic. Caregiver has noticed that patient has a knot under her chin that is red and has popped up in the last week. She denies noticing any drainage coming from this.    Past Medical History:  Diagnosis Date  . Anxiety   . Arthritis    osteoarthritis  . Breast cancer ([place]) 2015   [name]  . Cancer ([place]) 11/2013   T1c, Nx; ER/ PR +; Her 2 neg invasive mammary carcinoma left breast. Wide excision only based on Tumor Board review.   . Depression   . Difficulty swallowing   . Falls   . GERD  (gastroesophageal reflux disease)   . Hidradenitis 2012   MRSA, on chronic topical suppression  . History of hiatal hernia   . Hyperlipidemia   . Hypertension   . Mental retardation   . MRSA (methicillin resistant staph aureus) culture positive 2012  . Osteoporosis   . Pneumonia    aspiration pneumonia  . Seizures [facility])    last seizure February 2017    Past Surgical History:  Procedure Laterality Date  . BREAST LUMPECTOMY Left 12/07/2013   T1b, NX, ER/PR positive, HER-2/neu not overexpressed, 7 mm.  Not candidate for chemotherapy/sentinel node/radiation based on mental retardation.  Marland Kitchen BREAST SURGERY Left 12/07/2013   T1b, Nx; ER+, PR+, her 2 not amplified. (Not candidate for chemotherapy/radiation based on mental retardation)  . broken leg    . CERVICAL ABLATION    . 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 dislocated hip    . HARDWARE REMOVAL  08/23/2015   Procedure: HARDWARE REMOVAL;  Surgeon: Dereck Leep, MD;  Location: [facility] ORS;  Service: Orthopedics;;  . TOTAL HIP REVISION Right 08/23/2015   Procedure: TOTAL HIP REVISION;  Surgeon: Dereck Leep, MD;  Location: [facility] ORS;  Service: Orthopedics;  Laterality: Right;    Family History  Problem Relation Age of Onset  . Diabetes Father   . Parkinson's disease Father   . Neuropathy Mother   . Arthritis Mother   . Diabetes Mother   . Autoimmune disease Brother        AIDS  . Cancer Maternal Aunt 63       breast  . Breast cancer Maternal Aunt   . Hypertension Maternal Grandmother   . Cancer Paternal Grandmother     Social History   Socioeconomic History  . Marital status: Single    Spouse name: Not on file  . Number of children: Not on file  . Years of education: Not on file  . Highest education level: Not on file  Occupational History  . Not on file  Social Needs  . Financial resource strain: Not on  file  . Food insecurity:    Worry: Not on file    Inability: Not on file  . Transportation needs:    Medical: Not on file    Non-medical: Not on file  Tobacco Use  . Smoking status: Never Smoker  . Smokeless tobacco: Never Used  Substance and Sexual Activity  . Alcohol use: No  . Drug use: No  . Sexual activity: Never  Lifestyle  . Physical activity:    Days per week: Not on file    Minutes per session: Not on file  . Stress: Not on file  Relationships  . Social connections:    Talks on phone: Not on file    Gets together: Not on file    Attends religious service: Not on file    Active member of club or organization: Not on file    Attends meetings of clubs or organizations: Not on file    Relationship status: Not on file  . Intimate partner violence:    Fear of current or ex partner: Not on file    Emotionally abused: Not on file    Physically abused: Not on file    Forced sexual activity: Not on file  Other Topics Concern  . Not on file  Social History Narrative  . Not on file    Outpatient Medications Prior to Visit  Medication Sig Dispense Refill  . acetaminophen (TYLENOL) 325 MG tablet Take 2 tablets (650 mg total) by mouth every 6 (six) hours as needed. 100 tablet 11  . alendronate (FOSAMAX) 70 MG tablet TAKE 1 TABLET  BY MOUTH WEEKLY EARLY MORNING BEFORE FOOD/MEDS WITH WATER. DO NOT LIE DOWN FOR 30 MINUTES 4 tablet 12  . amLODipine (NORVASC) 10 MG tablet TAKE 1 TABLET BY MOUTH AT BEDTIME 30 tablet 12  . BENZOYL PEROXIDE 5 % external wash USE TOPICALLY ONCE DAILY 237 g 0  . calcium-vitamin D (OSCAL WITH D) 500-200 MG-UNIT TABS tablet TAKE 1 TABLET BY MOUTH EVERY DAY WITH BREAKFAST 30 each 11  . cetirizine (ZYRTEC) 10 MG tablet Take 1 tablet (10 mg total) by mouth daily. 30 tablet 11  . Cholecalciferol (VITAMIN D) 2000 units tablet TAKE 1 TABLET BY MOUTH EACH DAY 30 tablet 12  . clindamycin (CLEOCIN T) 1 % external solution Apply topically daily. 60 mL 12  .  clotrimazole (LOTRIMIN) 1 % cream APPLY TOPICALLY TWICE A DAY AS NEEDED FOR RASH IN SKIN FOLDS 30 g 0  . coal tar (NEUTROGENA T/GEL) 0.5 % shampoo Apply topically at bedtime as needed. 240 mL 12  . cyanocobalamin (,VITAMIN B-12,) 1000 MCG/ML injection GIVE 1000  MCG INJECTION "IM" ONCE A MONTH (B-12 SUPPLEMENT) 1 mL 11  . docusate sodium (COLACE) 100 MG capsule TAKE 1 CAPSULE BY MOUTH 2 TIMES PER DAY 60 capsule 12  . DULoxetine (CYMBALTA) 30 MG capsule TAKE 3 CAPSULES BY MOUTH EVERY DAY 90 capsule 11  . lamoTRIgine (LAMICTAL) 150 MG tablet TAKE 2 TABLETS BY MOUTH 2 TIMES PER DAY 120 tablet 0  . letrozole (FEMARA) 2.5 MG tablet TAKE 1 TABLET BY MOUTH EVERY DAY 30 tablet 12  . LORazepam (ATIVAN) 1 MG tablet TAKE 1 TAB BY MOUTH EVERY MORNING, 1/2 TAB AT NOON AND AT BEDTIME. TAKE 1 TAB FOR SEIZURES >3 MIN OR MORE THAN 2 IN 2H 90 tablet 4  . losartan (COZAAR) 100 MG tablet TAKE 1 TABLET BY MOUTH EVERY DAY 30 tablet 11  . pantoprazole (PROTONIX) 40 MG tablet TAKE 1 TABLET BY MOUTH 2 TIMES PER DAY **DO NOT CRUSH** 60 tablet 11  . terbinafine (LAMISIL) 1 % cream Use 2 times a week as needed 30 g 2  . triamcinolone (KENALOG) 0.025 % ointment Apply 1 application topically 2 (two) times daily as needed.    . ALLERGY 10 MG tablet TAKE 1 TABLET BY MOUTH EACH DAY 30 tablet 12  . azithromycin (ZITHROMAX) 250 MG tablet 2 PO day 1 and then 1 daily (Patient not taking: Reported on 12/24/2017) 6 tablet 0  . BENZOYL PEROXIDE-CLEANSER EX benzoyl peroxide    . doxycycline (VIBRA-TABS) 100 MG tablet Take 1 tablet (100 mg total) by mouth 2 (two) times daily. Before breakfast and before dinner 10 tablet 0  . meloxicam (MOBIC) 7.5 MG tablet meloxicam 7.5 mg tablet    . omeprazole (PRILOSEC) 20 MG capsule omeprazole 20 mg capsule,delayed release     No facility-administered medications prior to visit.     Allergies  Allergen Reactions  . Sulfa Antibiotics Other (See Comments)    "Does not know"  . Keflex [Cephalexin]  Other (See Comments)    "Does not know" unknown    ROS Review of Systems  Constitutional: Negative.   HENT: Positive for rhinorrhea.   Respiratory: Negative.   Cardiovascular: Negative.   Skin:       Knot on chin      Objective:    Physical Exam  Constitutional: She is oriented to person, place, and time. She appears well-developed and well-nourished.  Cardiovascular: Normal rate and regular rhythm.  Murmur heard. Pulmonary/Chest: Effort normal and breath sounds normal.  Neurological: She is alert and oriented to person, place, and time.  Skin: Skin is warm and dry. There is erythema.       BP 110/60 (BP Location: Left Arm, Patient Position: Sitting, Cuff Size: Normal)   Pulse 78   Temp 97.8 F (36.6 C) (Oral)   Resp 16   Wt 132 lb (59.9 kg)   SpO2 97%   BMI 24.14 kg/m  Wt Readings from Last 3 Encounters:  01/01/18 132 lb (59.9 kg)  12/24/17 132 lb 9.6 oz (60.1 kg)  11/06/17 136 lb (61.7 kg)     Health Maintenance Due  Topic Date Due  . HIV Screening  03/31/1973  . TETANUS/TDAP  03/31/1977  . INFLUENZA VACCINE  09/11/2017    There are no preventive care reminders to display for this patient.  Lab Results  Component Value Date   TSH 1.61 11/06/2016   Lab Results  Component Value Date   WBC 7.7 11/06/2016   HGB 11.4 (L) 11/06/2016   HCT 34.0 (L) 11/06/2016  MCV 88.8 11/06/2016   PLT 375 11/06/2016   Lab Results  Component Value Date   NA 142 11/06/2016   K 3.8 11/06/2016   CO2 32 11/06/2016   GLUCOSE 96 11/06/2016   BUN 15 11/06/2016   CREATININE 0.78 11/06/2016   BILITOT 0.4 11/06/2016   ALKPHOS 84 08/09/2015   AST 13 11/06/2016   ALT 9 11/06/2016   PROT 6.2 11/06/2016   ALBUMIN 4.4 08/09/2015   CALCIUM 9.3 11/06/2016   ANIONGAP 6 08/25/2015   Lab Results  Component Value Date   CHOL 276 (H) 11/06/2016   Lab Results  Component Value Date   HDL 109 11/06/2016   Lab Results  Component Value Date   LDLCALC 152 (H) 11/06/2016    Lab Results  Component Value Date   TRIG 62 11/06/2016   Lab Results  Component Value Date   CHOLHDL 2.5 11/06/2016   No results found for: HGBA1C    Assessment & Plan:  1. Hypertension, unspecified type  Stable today, continue amlodipine 10 mg daily and losartan 100 mg daily.  - Comprehensive Metabolic Panel (CMET)  2. Hyperlipidemia, unspecified hyperlipidemia type  Elevated, not currently on statin.  - Lipid Profile  3. Depression, unspecified depression type  Continue cymbalta. She does have Ativan for PRN use for anxiety or seizures.   4. Anxiety  - TSH  5. Seizure ([place])  She is followed by [facility]. She is currently on lamictal 150 mg in the morning and 300 mg at night.  6. Malignant neoplasm of lower-outer quadrant of left breast of female, estrogen receptor positive ([place])  Excision in 2015, followed by Dr. Bary Castilla. Most recent mammogram 02/2017 was normal and showed post   7. Infected sebaceous cyst  Location of cyst correlates with prior encounter for this last year. Will treat with doxycyline 100 mg BID x 7 days. She should follow up with surgery if this does not resolve, or if she needs cyst removed to prevent recurrence.   8. B12 deficiency  - CBC with Differential   Follow-up: Return in about 3 months (around 04/03/2018) for Chronic .   The entirety of the information documented in the History of Present Illness, Review of Systems and Physical Exam were personally obtained by me. Portions of this information were initially documented by Lynford Humphrey, CMA and reviewed by me for thoroughness and accuracy.   Trinna Post, PA-C

## 2018-01-02 LAB — COMPREHENSIVE METABOLIC PANEL
ALT: 11 IU/L (ref 0–32)
AST: 15 IU/L (ref 0–40)
Albumin/Globulin Ratio: 2 (ref 1.2–2.2)
Albumin: 4.3 g/dL (ref 3.5–5.5)
Alkaline Phosphatase: 90 IU/L (ref 39–117)
BUN/Creatinine Ratio: 24 — ABNORMAL HIGH (ref 9–23)
BUN: 18 mg/dL (ref 6–24)
Bilirubin Total: 0.2 mg/dL (ref 0.0–1.2)
CO2: 25 mmol/L (ref 20–29)
Calcium: 10.1 mg/dL (ref 8.7–10.2)
Chloride: 95 mmol/L — ABNORMAL LOW (ref 96–106)
Creatinine, Ser: 0.76 mg/dL (ref 0.57–1.00)
GFR calc Af Amer: 99 mL/min/{1.73_m2} (ref >59)
GFR calc non Af Amer: 86 mL/min/{1.73_m2} (ref >59)
Globulin, Total: 2.2 g/dL (ref 1.5–4.5)
Glucose: 91 mg/dL (ref 65–99)
Potassium: 4.2 mmol/L (ref 3.5–5.2)
Sodium: 138 mmol/L (ref 134–144)
Total Protein: 6.5 g/dL (ref 6.0–8.5)

## 2018-01-02 LAB — CBC WITH DIFFERENTIAL/PLATELET
Basophils Absolute: 0 10*3/uL (ref 0.0–0.2)
Basos: 1 %
EOS (ABSOLUTE): 0.1 10*3/uL (ref 0.0–0.4)
Eos: 1 %
Hematocrit: 32.5 % — ABNORMAL LOW (ref 34.0–46.6)
Hemoglobin: 10.9 g/dL — ABNORMAL LOW (ref 11.1–15.9)
Immature Grans (Abs): 0.1 10*3/uL (ref 0.0–0.1)
Immature Granulocytes: 1 %
Lymphocytes Absolute: 2.2 10*3/uL (ref 0.7–3.1)
Lymphs: 27 %
MCH: 28.9 pg (ref 26.6–33.0)
MCHC: 33.5 g/dL (ref 31.5–35.7)
MCV: 86 fL (ref 79–97)
Monocytes Absolute: 1 10*3/uL — ABNORMAL HIGH (ref 0.1–0.9)
Monocytes: 13 %
Neutrophils Absolute: 4.6 10*3/uL (ref 1.4–7.0)
Neutrophils: 57 %
Platelets: 441 10*3/uL (ref 150–450)
RBC: 3.77 x10E6/uL (ref 3.77–5.28)
RDW: 12.7 % (ref 12.3–15.4)
WBC: 8 10*3/uL (ref 3.4–10.8)

## 2018-01-02 LAB — TSH: TSH: 1.88 u[IU]/mL (ref 0.450–4.500)

## 2018-01-02 LAB — LIPID PANEL
Chol/HDL Ratio: 3.7 ratio (ref 0.0–4.4)
Cholesterol, Total: 250 mg/dL — ABNORMAL HIGH (ref 100–199)
HDL: 68 mg/dL (ref >39)
LDL Calculated: 141 mg/dL — ABNORMAL HIGH (ref 0–99)
Triglycerides: 207 mg/dL — ABNORMAL HIGH (ref 0–149)
VLDL Cholesterol Cal: 41 mg/dL — ABNORMAL HIGH (ref 5–40)

## 2018-01-06 ENCOUNTER — Telehealth: Payer: Self-pay

## 2018-01-06 NOTE — Telephone Encounter (Signed)
Patient is in a group home and I spoke with the group home coordinator Isaias Sakai Long about patients lab work.

## 2018-01-06 NOTE — Telephone Encounter (Signed)
-----   Message from Trinna Post, [place] sent at 01/05/2018  9:29 PM EST ----- Her labwork is stable, cholesterol slightly high. Please follow up with Dr. Rosanna Randy in office at next appt.

## 2018-01-07 ENCOUNTER — Ambulatory Visit: Payer: Self-pay | Admitting: Family Medicine

## 2018-01-13 ENCOUNTER — Telehealth: Payer: Self-pay | Admitting: Family Medicine

## 2018-01-13 NOTE — Telephone Encounter (Signed)
[place] Coordinator for group home pt resides in [phone]  Pt's nose is still very runny.  Asking if there is something else Dr. Rosanna Randy could call in for her to dry her runny nose up.  If Dr. Darnell Level does call in another Rx the order can be faxed to [phone].  Please advise.  Thanks, [employer]

## 2018-01-14 IMAGING — CT NM PET TUM IMG INITIAL (PI) SKULL BASE T - THIGH
3 series · 25 of 25 positions shown · non-contrast
Comparison: None.

CLINICAL DATA: Initial treatment strategy for lung nodule. History
of breast cancer diagnosed 1 year prior

EXAM:
NUCLEAR MEDICINE PET SKULL BASE TO THIGH
TECHNIQUE: 11.9 mCi F-18 FDG was injected intravenously. Full-ring PET imaging
was performed from the skull base to thigh after the radiotracer. CT
data was obtained and used for attenuation correction and anatomic
localization.
FASTING BLOOD GLUCOSE:  Value: 87 mg/dl

[Series 3: ct wb 5.0 b30f · axial · 5.0mm · 0.98mm/px · z∈[-1461,-711]mm · 9 of 251 slices shown]
[im 1/251]
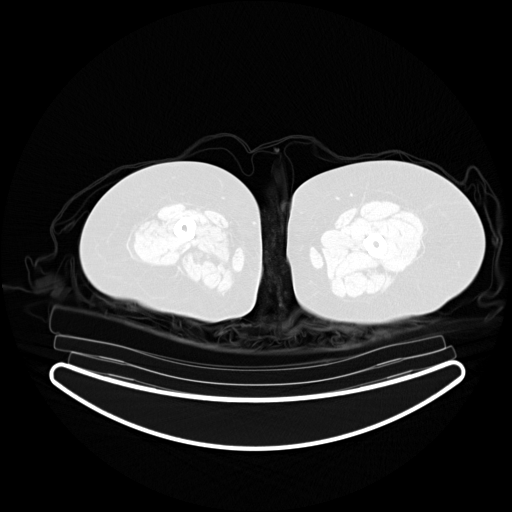
[im 32/251]
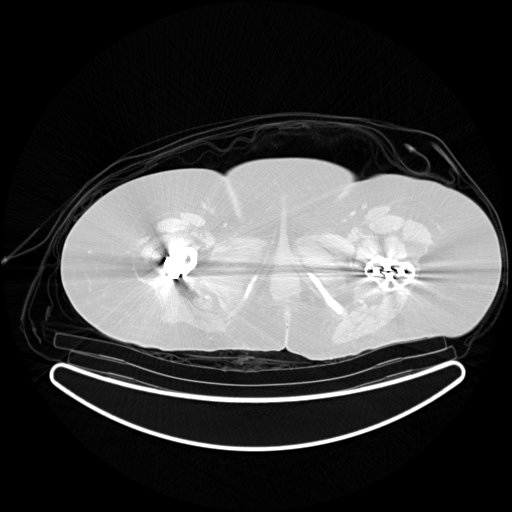
[im 63/251]
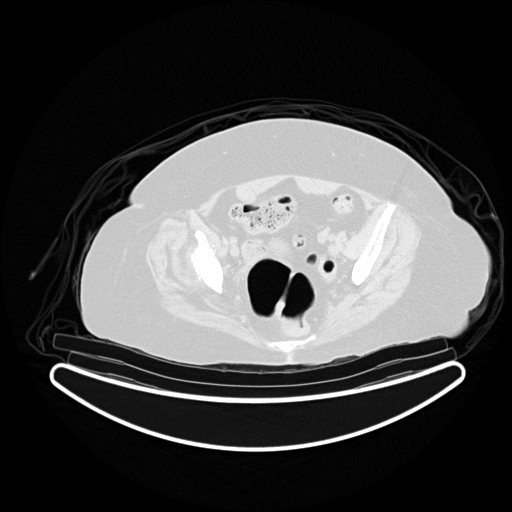
[im 94/251]
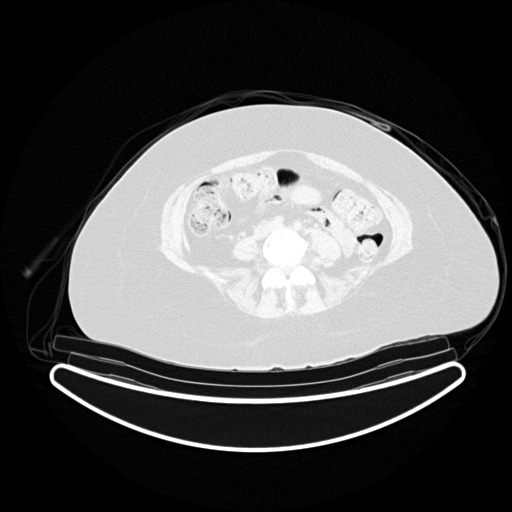
[im 126/251]
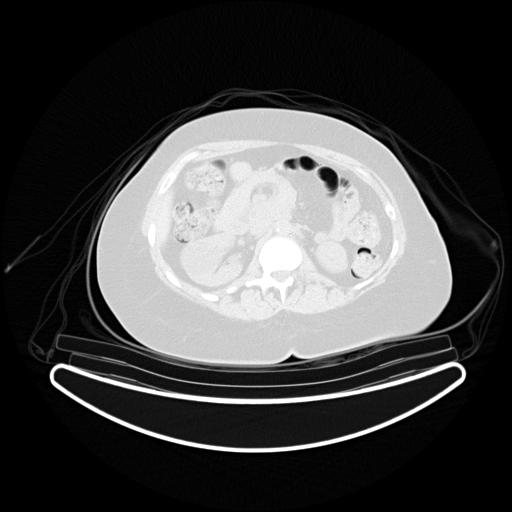
[im 157/251]
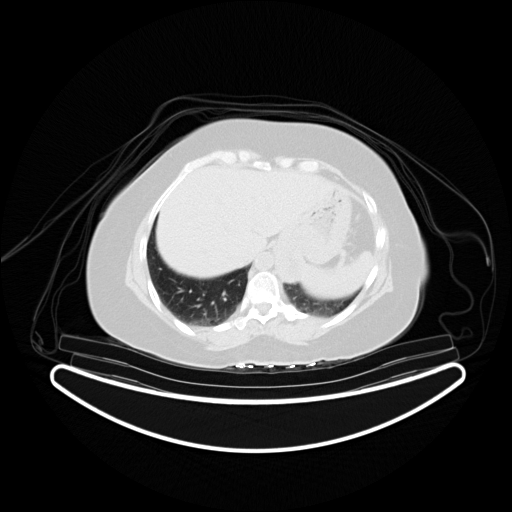
[im 188/251]
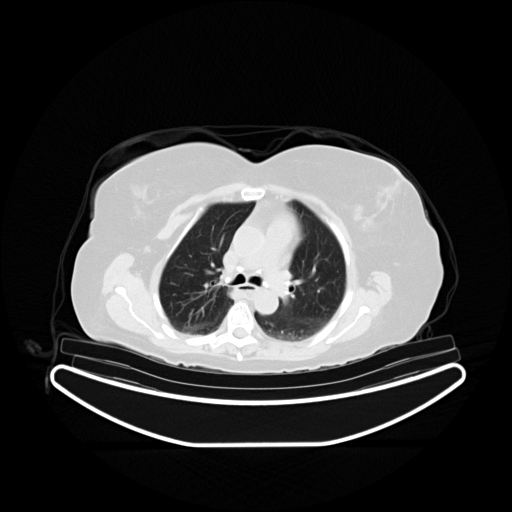
[im 219/251]
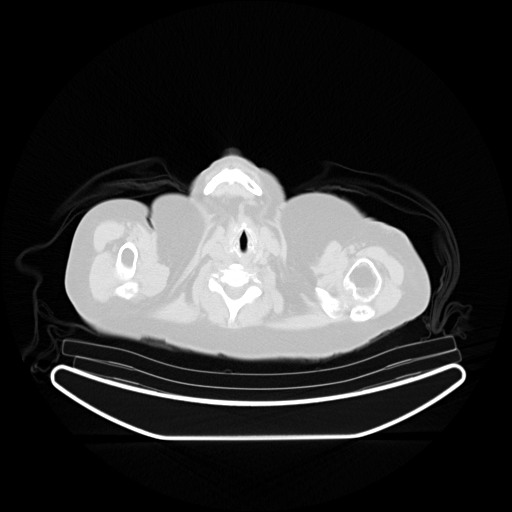
[im 251/251]
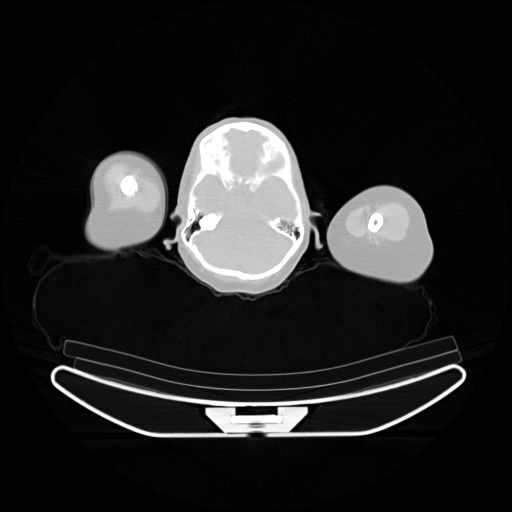

[Series 4: pet wb (ac) · axial · 5.0mm · 4.07mm/px · z∈[-1461,-711]mm · 8 of 251 slices shown]
[im 1/251]
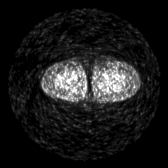
[im 36/251]
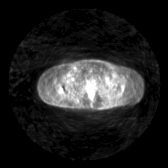
[im 72/251]
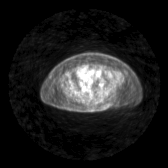
[im 108/251]
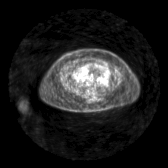
[im 143/251]
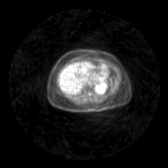
[im 179/251]
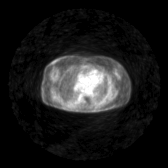
[im 215/251]
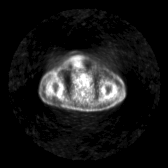
[im 251/251]
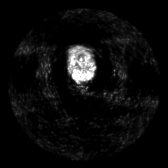

[Series 5: pet wb uncorrected (nac) · axial · 5.0mm · 4.07mm/px · z∈[-1461,-711]mm · 8 of 251 slices shown]
[im 1/251]
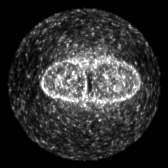
[im 36/251]
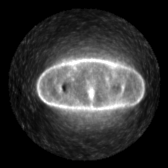
[im 72/251]
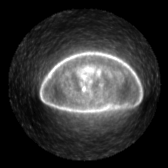
[im 108/251]
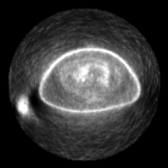
[im 143/251]
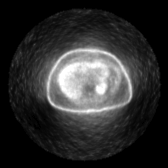
[im 179/251]
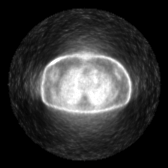
[im 215/251]
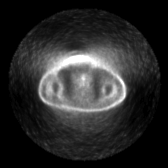
[im 251/251]
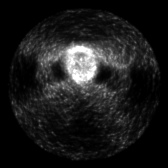

[25 of 25 positions shown; findings below may reference images not displayed]

FINDINGS: NECK

No hypermetabolic lymph nodes in the neck.

CHEST

Mild airspace disease in the RIGHT upper lobe is clear. No pulmonary
nodule. No hypermetabolic mediastinal hilar lymph nodes. No
hypermetabolic axillary nodes. Moderate hiatal hernia noted. There
is calcified mediastinal and hilar lymph nodes.

ABDOMEN/PELVIS

No abnormal hypermetabolic activity within the liver, pancreas,
adrenal glands, or spleen. No hypermetabolic lymph nodes in the
abdomen or pelvis.

SKELETON

Intense metabolic activity associated with the RIGHT femoral head.
There is collapse of the femoral head about the fixation screws.
Internal fixation of the LEFT hip without complication.
IMPRESSION: 1. No evidence of breast cancer recurrence.
2. No suspicious nodules.
3. Calcified mediastinal and hilar lymph nodes consists with prior
granulomatous disease
4. Intense metabolism associated with RIGHT femoral head consistent
with degenerative collapse of the head following fixation.

## 2018-01-14 NOTE — Telephone Encounter (Signed)
Zyrtec 10mg  might help--1 month only.

## 2018-01-14 NOTE — Telephone Encounter (Signed)
Order faxed to number listed below.

## 2018-01-14 NOTE — Telephone Encounter (Signed)
Please review. Thanks!  

## 2018-01-15 NOTE — Telephone Encounter (Signed)
Fuller Plan Long is calling back regarding the pt's nasal drainage.  Zyrtec isn't helping. Call Ms. Long at [phone].  Please advise.  Thanks, [employer]

## 2018-01-16 NOTE — Telephone Encounter (Signed)
Please advise 

## 2018-01-16 NOTE — Telephone Encounter (Signed)
Try adding flonase to this daily. 2 sprays each nostril daily  Helen Patton, [name], MD, MPH [facility] 01/16/2018 4:27 PM

## 2018-01-16 NOTE — Telephone Encounter (Signed)
Order faxed.

## 2018-01-20 ENCOUNTER — Telehealth: Payer: Self-pay

## 2018-01-20 DIAGNOSIS — J309 Allergic rhinitis, unspecified: Secondary | ICD-10-CM

## 2018-01-20 NOTE — Telephone Encounter (Signed)
Please review. Thanks!  

## 2018-01-20 NOTE — Telephone Encounter (Signed)
Ms. Gilford Rile had called the office stating that patient has had a runny nose that she describes as severe and states that patient was seen in the office twice for this matter and was prescribed Zyrtec. She states that allergy medication is not helping with symptoms and wants to know if something else can be called into [facility]. KW

## 2018-01-21 ENCOUNTER — Other Ambulatory Visit: Payer: Self-pay | Admitting: Family Medicine

## 2018-01-21 MED ORDER — AZELASTINE-FLUTICASONE 137-50 MCG/ACT NA SUSP: 2.0000 | Freq: Every day | NASAL | 0 refills | Status: DC

## 2018-01-21 NOTE — Telephone Encounter (Signed)
Dymista sent into the pharmacy.

## 2018-01-21 NOTE — Telephone Encounter (Signed)
Dymista nasal spry each morning.

## 2018-01-30 ENCOUNTER — Other Ambulatory Visit: Payer: Self-pay | Admitting: Family Medicine

## 2018-01-30 DIAGNOSIS — L309 Dermatitis, unspecified: Secondary | ICD-10-CM

## 2018-02-03 ENCOUNTER — Ambulatory Visit: Payer: Self-pay | Admitting: Family Medicine

## 2018-02-09 ENCOUNTER — Ambulatory Visit: Payer: Self-pay | Admitting: Family Medicine

## 2018-02-10 ENCOUNTER — Other Ambulatory Visit: Payer: Self-pay

## 2018-02-10 DIAGNOSIS — C50512 Malignant neoplasm of lower-outer quadrant of left female breast: Secondary | ICD-10-CM

## 2018-02-10 DIAGNOSIS — Z170 Estrogen receptor positive status [ER+]: Principal | ICD-10-CM

## 2018-02-17 ENCOUNTER — Ambulatory Visit (INDEPENDENT_AMBULATORY_CARE_PROVIDER_SITE_OTHER): Payer: Medicare Other | Admitting: Family Medicine

## 2018-02-17 ENCOUNTER — Encounter: Payer: Self-pay | Admitting: Family Medicine

## 2018-02-17 ENCOUNTER — Other Ambulatory Visit: Payer: Self-pay

## 2018-02-17 VITALS — BP 130/60 | HR 98 | Temp 97.6°F | Ht <= 58 in | Wt 132.4 lb

## 2018-02-17 DIAGNOSIS — J310 Chronic rhinitis: Secondary | ICD-10-CM

## 2018-02-17 DIAGNOSIS — W19XXXA Unspecified fall, initial encounter: Secondary | ICD-10-CM

## 2018-02-17 MED ORDER — IPRATROPIUM BROMIDE 0.03 % NA SOLN: 2.0000 | Freq: Two times a day (BID) | NASAL | 12 refills | Status: DC

## 2018-02-17 NOTE — Patient Instructions (Signed)
Let us know if not improving with the new nasal spray.

## 2018-02-17 NOTE — Progress Notes (Signed)
  Subjective:     Patient ID: Helen Patton, female   DOB: 1960-07-12, 58 y.o.   MRN: [id number] Chief Complaint  Patient presents with  . Headache    since 02/16/18 pt had a fall which caregiver reports there were no injuries  . Nasal Congestion    runny nose going on for a while since last visit with gilbert 12/24/17 and not got any better   HPI Previously treated with Zyrtec and Dymista without improvement. Continues to have watery nasal drainage with frequent use of tissues. Also had unwitnessed fall with no apparent injury. Accompanied by Helen Patton, who acts as historian.  Review of Systems     Objective:   Physical Exam Constitutional:      General: She is not in acute distress.    Appearance: She is not ill-appearing.  HENT:     Head: No raccoon eyes or Battle's sign.     Ears:     Comments: No hemotympanum.    Nose: Rhinorrhea (clear and watery) present.  Eyes:     Comments: Pupils equal. EOM's intact  Musculoskeletal:     Comments: Cervical FROM  Neurological:     Mental Status: She is alert.     Comments: Follows simple commands        Assessment:    1. Chronic rhinitis: will d/c Zyrtec and Dymista - ipratropium (ATROVENT) 0.03 % nasal spray; Place 2 sprays into both nostrils every 12 (twelve) hours.  Dispense: 30 mL; Refill: 12  2. Fall, initial encounter: no specific injury noted.    Plan:    Let us now if new nasal spray not helping.

## 2018-02-24 ENCOUNTER — Ambulatory Visit (INDEPENDENT_AMBULATORY_CARE_PROVIDER_SITE_OTHER): Payer: Medicare Other | Admitting: Family Medicine

## 2018-02-24 ENCOUNTER — Encounter: Payer: Self-pay | Admitting: Family Medicine

## 2018-02-24 VITALS — BP 123/72 | HR 85 | Temp 98.1°F | Wt 129.4 lb

## 2018-02-24 DIAGNOSIS — J310 Chronic rhinitis: Secondary | ICD-10-CM

## 2018-02-24 MED ORDER — IPRATROPIUM BROMIDE 0.03 % NA SOLN: 2.0000 | Freq: Three times a day (TID) | NASAL | 12 refills | Status: AC

## 2018-02-24 MED ORDER — IPRATROPIUM BROMIDE 0.03 % NA SOLN: 2.0000 | Freq: Three times a day (TID) | NASAL | 12 refills | Status: DC

## 2018-02-24 NOTE — Patient Instructions (Signed)

## 2018-02-24 NOTE — Progress Notes (Signed)
Patient: Helen Patton Female    DOB: [birth year]   58 y.o.   MRN: [id number] Visit Date: 02/25/2018  Today's Provider: Lavon Paganini, MD   No chief complaint on file.  Subjective:    I, Tiburcio Pea, CMA, am acting as a scribe for Lavon Paganini, MD.   HPI Patient presents today with caregiver. Caregiver reports patient is experiencing rhinorrhea since November that is gradually worsening. Patient has tried Zyrtec and Flonase with no relief.   Allergies  Allergen Reactions  . Sulfa Antibiotics Other (See Comments)    "Does not know"  . Keflex [Cephalexin] Other (See Comments)    "Does not know" unknown     Current Outpatient Medications:  .  acetaminophen (TYLENOL) 325 MG tablet, Take 2 tablets (650 mg total) by mouth every 6 (six) hours as needed., Disp: 100 tablet, Rfl: 11 .  alendronate (FOSAMAX) 70 MG tablet, TAKE 1 TABLET  BY MOUTH WEEKLY EARLY MORNING BEFORE FOOD/MEDS WITH WATER. DO NOT LIE DOWN FOR 30 MINUTES, Disp: 4 tablet, Rfl: 12 .  amLODipine (NORVASC) 10 MG tablet, TAKE 1 TABLET BY MOUTH AT BEDTIME, Disp: 30 tablet, Rfl: 12 .  BENZOYL PEROXIDE 5 % external wash, USE TOPICALLY ONCE DAILY, Disp: 150 g, Rfl: 3 .  calcium-vitamin D (OSCAL WITH D) 500-200 MG-UNIT TABS tablet, TAKE 1 TABLET BY MOUTH EVERY DAY WITH BREAKFAST, Disp: 30 each, Rfl: 11 .  Cholecalciferol (VITAMIN D) 2000 units tablet, TAKE 1 TABLET BY MOUTH EACH DAY, Disp: 30 tablet, Rfl: 12 .  clindamycin (CLEOCIN T) 1 % external solution, Apply topically daily., Disp: 60 mL, Rfl: 12 .  clotrimazole (LOTRIMIN) 1 % cream, APPLY TOPICALLY TWICE A DAY AS NEEDED FOR RASH IN SKIN FOLDS, Disp: 30 g, Rfl: 0 .  coal tar (NEUTROGENA T-GEL) 0.5 % shampoo, Use as directed, Disp: 30 mL, Rfl: 11 .  cyanocobalamin (,VITAMIN B-12,) 1000 MCG/ML injection, GIVE 1000 MCG INJECTION "IM" ONCE A MONTH (B-12 SUPPLEMENT), Disp: 1 mL, Rfl: 11 .  docusate sodium (COLACE) 100 MG capsule, TAKE 1 CAPSULE BY MOUTH 2 TIMES  PER DAY, Disp: 60 capsule, Rfl: 12 .  DULoxetine (CYMBALTA) 30 MG capsule, TAKE 3 CAPSULES BY MOUTH EVERY DAY, Disp: 90 capsule, Rfl: 11 .  ipratropium (ATROVENT) 0.03 % nasal spray, Place 2 sprays into both nostrils 3 (three) times daily., Disp: 30 mL, Rfl: 12 .  lamoTRIgine (LAMICTAL) 150 MG tablet, TAKE 2 TABLETS BY MOUTH 2 TIMES PER DAY, Disp: 120 tablet, Rfl: 0 .  letrozole (FEMARA) 2.5 MG tablet, TAKE 1 TABLET BY MOUTH EVERY DAY, Disp: 30 tablet, Rfl: 12 .  LORazepam (ATIVAN) 1 MG tablet, TAKE 1 TAB BY MOUTH EVERY MORNING, 1/2 TAB AT NOON AND AT BEDTIME. TAKE 1 TAB FOR SEIZURES >3 MIN OR MORE THAN 2 IN 2H, Disp: 90 tablet, Rfl: 4 .  losartan (COZAAR) 100 MG tablet, TAKE 1 TABLET BY MOUTH EVERY DAY, Disp: 30 tablet, Rfl: 11 .  pantoprazole (PROTONIX) 40 MG tablet, TAKE 1 TABLET BY MOUTH 2 TIMES PER DAY **DO NOT CRUSH**, Disp: 60 tablet, Rfl: 11 .  terbinafine (LAMISIL) 1 % cream, Use 2 times a week as needed, Disp: 30 g, Rfl: 2 .  triamcinolone (KENALOG) 0.025 % ointment, Apply 1 application topically 2 (two) times daily as needed., Disp: , Rfl:    Review of Systems  Constitutional: Negative.   HENT: Positive for postnasal drip and rhinorrhea.   Respiratory: Negative.   Cardiovascular: Negative.   Musculoskeletal:  Negative.     Social History   Tobacco Use  . Smoking status: Never Smoker  . Smokeless tobacco: Never Used  Substance Use Topics  . Alcohol use: No      Objective:   BP 123/72 (BP Location: Left Arm, Patient Position: Sitting, Cuff Size: Normal)   Pulse 85   Temp 98.1 F (36.7 C) (Oral)   Wt 129 lb 6.4 oz (58.7 kg)   SpO2 96%   BMI 27.04 kg/m  Vitals:   02/24/18 1019  BP: 123/72  Pulse: 85  Temp: 98.1 F (36.7 C)  TempSrc: Oral  SpO2: 96%  Weight: 129 lb 6.4 oz (58.7 kg)     Physical Exam Vitals signs reviewed.  Constitutional:      General: She is not in acute distress.    Appearance: Normal appearance.  HENT:     Head: Normocephalic and  atraumatic.     Right Ear: Tympanic membrane, ear canal and external ear normal.     Left Ear: Tympanic membrane, ear canal and external ear normal.     Nose: Mucosal edema and rhinorrhea present. No signs of injury or laceration. Rhinorrhea is clear.     Right Sinus: No maxillary sinus tenderness or frontal sinus tenderness.     Left Sinus: No maxillary sinus tenderness or frontal sinus tenderness.  Neck:     Musculoskeletal: Neck supple.  Cardiovascular:     Rate and Rhythm: Normal rate and regular rhythm.     Pulses: Normal pulses.     Heart sounds: Normal heart sounds. No murmur.  Pulmonary:     Effort: Pulmonary effort is normal. No respiratory distress.     Breath sounds: Normal breath sounds. No wheezing or rhonchi.  Musculoskeletal:     Right lower leg: No edema.     Left lower leg: No edema.  Lymphadenopathy:     Cervical: No cervical adenopathy.  Skin:    General: Skin is warm and dry.     Capillary Refill: Capillary refill takes less than 2 seconds.     Findings: No rash.  Neurological:     Mental Status: She is alert.  Psychiatric:        Mood and Affect: Mood normal.        Behavior: Behavior normal.         Assessment & Plan   1. Chronic rhinitis - seems to be an allergic process - she is not bothered by this, but caregiver is - will increase atrovent nasal spray to TID - has f/u with PCP next month - ipratropium (ATROVENT) 0.03 % nasal spray; Place 2 sprays into both nostrils 3 (three) times daily.  Dispense: 30 mL; Refill: 12    Meds ordered this encounter  Medications  . DISCONTD: ipratropium (ATROVENT) 0.03 % nasal spray    Sig: Place 2 sprays into both nostrils 3 (three) times daily.    Dispense:  30 mL    Refill:  12  . ipratropium (ATROVENT) 0.03 % nasal spray    Sig: Place 2 sprays into both nostrils 3 (three) times daily.    Dispense:  30 mL    Refill:  12     Return if symptoms worsen or fail to improve.   The entirety of the  information documented in the History of Present Illness, Review of Systems and Physical Exam were personally obtained by me. Portions of this information were initially documented by Tiburcio Pea, CMA and reviewed by me for thoroughness and  accuracy.    Virginia Crews, MD, MPH [facility] 02/25/2018 5:20 PM

## 2018-03-04 DIAGNOSIS — S7012XA Contusion of left thigh, initial encounter: Secondary | ICD-10-CM | POA: Diagnosis not present

## 2018-03-04 DIAGNOSIS — S7011XA Contusion of right thigh, initial encounter: Secondary | ICD-10-CM | POA: Diagnosis not present

## 2018-03-07 ENCOUNTER — Other Ambulatory Visit: Payer: Self-pay | Admitting: Family Medicine

## 2018-03-17 ENCOUNTER — Ambulatory Visit
Admission: RE | Admit: 2018-03-17 | Discharge: 2018-03-17 | Disposition: A | Discharge status: Home / Self Care | Payer: Medicare Other | Source: Ambulatory Visit | Attending: Surgery | Admitting: Surgery

## 2018-03-17 DIAGNOSIS — Z170 Estrogen receptor positive status [ER+]: Principal | ICD-10-CM

## 2018-03-17 DIAGNOSIS — R922 Inconclusive mammogram: Secondary | ICD-10-CM | POA: Diagnosis not present

## 2018-03-17 DIAGNOSIS — C50512 Malignant neoplasm of lower-outer quadrant of left female breast: Secondary | ICD-10-CM | POA: Insufficient documentation

## 2018-03-17 DIAGNOSIS — C50919 Malignant neoplasm of unspecified site of unspecified female breast: Secondary | ICD-10-CM | POA: Diagnosis not present

## 2018-03-17 DIAGNOSIS — F339 Major depressive disorder, recurrent, unspecified: Secondary | ICD-10-CM | POA: Diagnosis not present

## 2018-03-26 ENCOUNTER — Other Ambulatory Visit: Payer: Self-pay

## 2018-03-26 ENCOUNTER — Ambulatory Visit (INDEPENDENT_AMBULATORY_CARE_PROVIDER_SITE_OTHER): Payer: Medicare Other | Admitting: General Surgery

## 2018-03-26 ENCOUNTER — Encounter: Payer: Self-pay | Admitting: General Surgery

## 2018-03-26 ENCOUNTER — Ambulatory Visit: Payer: Medicare Other | Admitting: General Surgery

## 2018-03-26 VITALS — BP 122/62 | HR 90 | Resp 11 | Ht <= 58 in | Wt 126.0 lb

## 2018-03-26 DIAGNOSIS — Z170 Estrogen receptor positive status [ER+]: Secondary | ICD-10-CM | POA: Diagnosis not present

## 2018-03-26 DIAGNOSIS — C50512 Malignant neoplasm of lower-outer quadrant of left female breast: Secondary | ICD-10-CM

## 2018-03-26 NOTE — Patient Instructions (Signed)
The patient is aware to call back for any questions or new concerns. Patient will be asked to return to the office in one year with a bilateral screening mammogram.  

## 2018-03-26 NOTE — Progress Notes (Signed)
Patient ID: Helen Patton, female   DOB: 22-Mar-1960, 58 y.o.   MRN: [id number]  Chief Complaint  Patient presents with  . Follow-up     f/u recall bil diag mammo [facility] 03/17/18,    HPI Helen Patton is a 58 y.o. female.  who presents for her follow up left breast cancer a breast evaluation. The most recent mammogram was done on 03-17-18.  Patient does perform regular self breast checks and gets regular mammograms done.   Tolerating the letrozole. She is here with her care givers, Helen Patton and Helen Patton. Her mother, Helen Patton, is also present,   HPI  Past Medical History:  Diagnosis Date  . Anxiety   . Arthritis    osteoarthritis  . Breast cancer ([place]) 2015   [name]  . Cancer ([place]) 11/2013   T1c, Nx; ER/ PR +; Her 2 neg invasive mammary carcinoma left breast. Wide excision only based on Tumor Board review.   . Cataract   . Depression   . Difficulty swallowing   . Falls   . GERD (gastroesophageal reflux disease)   . Hidradenitis 2012   MRSA, on chronic topical suppression  . History of hiatal hernia   . Hyperlipidemia   . Hypertension   . Mental retardation   . MRSA (methicillin resistant staph aureus) culture positive 2012  . Osteoporosis   . Pneumonia    aspiration pneumonia  . Seizures [facility])    last seizure February 2017    Past Surgical History:  Procedure Laterality Date  . BREAST LUMPECTOMY Left 12/07/2013   T1b, NX, ER/PR positive, HER-2/neu not overexpressed, 7 mm.  Not candidate for chemotherapy/sentinel node/radiation based on mental retardation.  [facility] Kitchen BREAST SURGERY Left 12/07/2013   T1b, Nx; ER+, PR+, her 2 not amplified. (Not candidate for chemotherapy/radiation based on mental retardation)  . broken leg    . CERVICAL ABLATION    . 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 dislocated hip    . HARDWARE REMOVAL  08/23/2015   Procedure:  HARDWARE REMOVAL;  Surgeon: Dereck Leep, MD;  Location: [facility] ORS;  Service: Orthopedics;;  . TOTAL HIP REVISION Right 08/23/2015   Procedure: TOTAL HIP REVISION;  Surgeon: Dereck Leep, MD;  Location: [facility] ORS;  Service: Orthopedics;  Laterality: Right;    Family History  Problem Relation Age of Onset  . Diabetes Father   . Parkinson's disease Father   . Neuropathy Mother   . Arthritis Mother   . Diabetes Mother   . Autoimmune disease Brother        AIDS  . Cancer Maternal Aunt 63       breast  . Breast cancer Maternal Aunt   . Hypertension Maternal Grandmother   . Cancer Paternal Grandmother     Social History Social History   Tobacco Use  . Smoking status: Never Smoker  . Smokeless tobacco: Never Used  Substance Use Topics  . Alcohol use: No  . Drug use: No    Allergies  Allergen Reactions  . Sulfa Antibiotics Other (See Comments)    "Does not know"  . Keflex [Cephalexin] Other (See Comments)    "Does not know" unknown    Current Outpatient Medications  Medication Sig Dispense Refill  . acetaminophen (TYLENOL) 325 MG tablet Take 2 tablets (650 mg total) by mouth every 6 (six) hours as needed. 100  tablet 11  . alendronate (FOSAMAX) 70 MG tablet TAKE 1 TABLET  BY MOUTH WEEKLY EARLY MORNING BEFORE FOOD/MEDS WITH WATER. DO NOT LIE DOWN FOR 30 MINUTES 4 tablet 12  . amLODipine (NORVASC) 10 MG tablet TAKE 1 TABLET BY MOUTH AT BEDTIME 30 tablet 12  . BENZOYL PEROXIDE 5 % external wash USE TOPICALLY ONCE DAILY 150 g 3  . calcium-vitamin D (OSCAL WITH D) 500-200 MG-UNIT TABS tablet TAKE 1 TABLET BY MOUTH EVERY DAY WITH BREAKFAST 30 each 11  . Cholecalciferol (VITAMIN D) 2000 units tablet TAKE 1 TABLET BY MOUTH EACH DAY 30 tablet 12  . clindamycin (CLEOCIN T) 1 % external solution Apply topically daily. 60 mL 12  . clotrimazole (LOTRIMIN) 1 % cream APPLY TOPICALLY TWICE A DAY AS NEEDED FOR RASH IN SKIN FOLDS 30 g 0  . coal tar (NEUTROGENA T-GEL) 0.5 % shampoo Use as  directed 30 mL 11  . cyanocobalamin (,VITAMIN B-12,) 1000 MCG/ML injection GIVE 1000 MCG INJECTION "IM" ONCE A MONTH (B-12 SUPPLEMENT) 1 mL 11  . docusate sodium (COLACE) 100 MG capsule TAKE 1 CAPSULE BY MOUTH 2 TIMES PER DAY 60 capsule 12  . DULoxetine (CYMBALTA) 30 MG capsule TAKE 3 CAPSULES BY MOUTH EVERY DAY 90 capsule 11  . ipratropium (ATROVENT) 0.03 % nasal spray Place 2 sprays into both nostrils 3 (three) times daily. 30 mL 12  . lamoTRIgine (LAMICTAL) 150 MG tablet TAKE 2 TABLETS BY MOUTH 2 TIMES PER DAY 120 tablet 5  . letrozole (FEMARA) 2.5 MG tablet TAKE 1 TABLET BY MOUTH EVERY DAY 30 tablet 12  . LORazepam (ATIVAN) 1 MG tablet TAKE 1 TAB BY MOUTH EVERY MORNING, 1/2 TAB AT NOON AND AT BEDTIME. TAKE 1 TAB FOR SEIZURES >3 MIN OR MORE THAN 2 IN 2H 90 tablet 4  . losartan (COZAAR) 100 MG tablet TAKE 1 TABLET BY MOUTH EVERY DAY 30 tablet 11  . pantoprazole (PROTONIX) 40 MG tablet TAKE 1 TABLET BY MOUTH 2 TIMES PER DAY **DO NOT CRUSH** 60 tablet 11  . terbinafine (LAMISIL) 1 % cream Use 2 times a week as needed 30 g 2  . triamcinolone (KENALOG) 0.025 % ointment Apply 1 application topically 2 (two) times daily as needed.     No current facility-administered medications for this visit.     Review of Systems Review of Systems  Constitutional: Negative.   Respiratory: Negative.   Cardiovascular: Negative.     Blood pressure 122/62, pulse 90, resp. rate 11, height _0  (1.473 m), weight 126 lb (57.2 kg), SpO2 96 %.  Physical Exam Physical Exam Exam conducted with a chaperone present.  Constitutional:      Appearance: She is well-developed.  Eyes:     General: No scleral icterus.    Conjunctiva/sclera: Conjunctivae normal.  Neck:     Musculoskeletal: Neck supple.  Cardiovascular:     Rate and Rhythm: Normal rate and regular rhythm.     Heart sounds: Murmur present. Systolic murmur present with a grade of 2/6.     Comments: No report of shortness of breath or chest pain.  No  peripheral edema. Pulmonary:     Effort: Pulmonary effort is normal.     Breath sounds: Normal breath sounds.  Chest:     Breasts:        Right: No inverted nipple, mass, nipple discharge, skin change or tenderness.        Left: No inverted nipple, mass, nipple discharge, skin change or tenderness.  Lymphadenopathy:     Cervical: No cervical adenopathy.     Upper Body:     Right upper body: No supraclavicular or axillary adenopathy.     Left upper body: No supraclavicular or axillary adenopathy.  Skin:    General: Skin is warm and dry.  Neurological:     Mental Status: She is alert and oriented to person, place, and time.  Psychiatric:        Behavior: Behavior normal.     Data Reviewed Bilateral diagnostic mammograms dated March 17, 2018 were reviewed.  Postsurgical changes on the left.  BI-RADS-2.  Recommendation to change to screening studies next year.  Assessment    No evidence of recurrent cancer now 4-1/2 years post wide excision.      Plan    The patient is aware to call back for any questions or new concerns. Patient will be asked to return to the office in one year with a bilateral screening mammogram.       HPI, assessment, plan and physical exam has been scribed under the direction and in the presence of Robert Bellow, MD. Karie Fetch, RN  I have completed the exam and reviewed the above documentation for accuracy and completeness.  I agree with the above.  Haematologist has been used and any errors in dictation or transcription are unintentional.  Hervey Ard, M.D., F.A.C.S.  Forest Gleason Emiya Loomer 03/27/2018, 5:43 AM

## 2018-04-02 ENCOUNTER — Ambulatory Visit (INDEPENDENT_AMBULATORY_CARE_PROVIDER_SITE_OTHER): Payer: Medicare Other | Admitting: Family Medicine

## 2018-04-02 ENCOUNTER — Other Ambulatory Visit: Payer: Self-pay

## 2018-04-02 ENCOUNTER — Encounter: Payer: Self-pay | Admitting: Family Medicine

## 2018-04-02 VITALS — BP 118/60 | HR 84 | Temp 97.9°F | Ht <= 58 in | Wt 129.2 lb

## 2018-04-02 DIAGNOSIS — S72001D Fracture of unspecified part of neck of right femur, subsequent encounter for closed fracture with routine healing: Secondary | ICD-10-CM

## 2018-04-02 DIAGNOSIS — Q02 Microcephaly: Secondary | ICD-10-CM

## 2018-04-02 DIAGNOSIS — J310 Chronic rhinitis: Secondary | ICD-10-CM

## 2018-04-02 DIAGNOSIS — F79 Unspecified intellectual disabilities: Secondary | ICD-10-CM

## 2018-04-02 DIAGNOSIS — R251 Tremor, unspecified: Secondary | ICD-10-CM

## 2018-04-02 DIAGNOSIS — I10 Essential (primary) hypertension: Secondary | ICD-10-CM

## 2018-04-02 MED ORDER — DIPHENHYDRAMINE HCL 12.5 MG PO CHEW: 12.5000 mg | CHEWABLE_TABLET | Freq: Three times a day (TID) | ORAL | 11 refills | Status: AC

## 2018-04-02 MED ORDER — DIPHENHYDRAMINE HCL 12.5 MG PO CHEW: 12.5000 mg | CHEWABLE_TABLET | Freq: Three times a day (TID) | ORAL | 0 refills | Status: DC

## 2018-04-02 NOTE — Progress Notes (Signed)
Patient: Helen Patton Female    DOB: 1960-12-04   58 y.o.   MRN: [id number] Visit Date: 04/02/2018  Today's Provider: Wilhemena Durie, MD   Chief Complaint  Patient presents with  . Follow-up    3 month chronic issues.  runny nose, mom asking to review shaky hands  . Leg Pain    right?   Subjective:     HPI  Caregiver reports pt is being seen for chronic nose dripping from right nostril.  She also states pt has complained of leg pain and pt has pointed to the right leg where she has pain.  Mother is also requesting for pt to be evaluated for hand shaking. Patient remains functional in her current level of care.  Medications and history are reviewed.  Allergies  Allergen Reactions  . Sulfa Antibiotics Other (See Comments)    "Does not know"  . Keflex [Cephalexin] Other (See Comments)    "Does not know" unknown     Current Outpatient Medications:  .  acetaminophen (TYLENOL) 325 MG tablet, Take 2 tablets (650 mg total) by mouth every 6 (six) hours as needed., Disp: 100 tablet, Rfl: 11 .  alendronate (FOSAMAX) 70 MG tablet, TAKE 1 TABLET  BY MOUTH WEEKLY EARLY MORNING BEFORE FOOD/MEDS WITH WATER. DO NOT LIE DOWN FOR 30 MINUTES, Disp: 4 tablet, Rfl: 12 .  amLODipine (NORVASC) 10 MG tablet, TAKE 1 TABLET BY MOUTH AT BEDTIME, Disp: 30 tablet, Rfl: 12 .  BENZOYL PEROXIDE 5 % external wash, USE TOPICALLY ONCE DAILY, Disp: 150 g, Rfl: 3 .  calcium-vitamin D (OSCAL WITH D) 500-200 MG-UNIT TABS tablet, TAKE 1 TABLET BY MOUTH EVERY DAY WITH BREAKFAST, Disp: 30 each, Rfl: 11 .  Cholecalciferol (VITAMIN D) 2000 units tablet, TAKE 1 TABLET BY MOUTH EACH DAY, Disp: 30 tablet, Rfl: 12 .  clindamycin (CLEOCIN T) 1 % external solution, Apply topically daily., Disp: 60 mL, Rfl: 12 .  clotrimazole (LOTRIMIN) 1 % cream, APPLY TOPICALLY TWICE A DAY AS NEEDED FOR RASH IN SKIN FOLDS, Disp: 30 g, Rfl: 0 .  coal tar (NEUTROGENA T-GEL) 0.5 % shampoo, Use as directed, Disp: 30 mL, Rfl:  11 .  cyanocobalamin (,VITAMIN B-12,) 1000 MCG/ML injection, GIVE 1000 MCG INJECTION "IM" ONCE A MONTH (B-12 SUPPLEMENT), Disp: 1 mL, Rfl: 11 .  docusate sodium (COLACE) 100 MG capsule, TAKE 1 CAPSULE BY MOUTH 2 TIMES PER DAY, Disp: 60 capsule, Rfl: 12 .  DULoxetine (CYMBALTA) 30 MG capsule, TAKE 3 CAPSULES BY MOUTH EVERY DAY, Disp: 90 capsule, Rfl: 11 .  ipratropium (ATROVENT) 0.03 % nasal spray, Place 2 sprays into both nostrils 3 (three) times daily., Disp: 30 mL, Rfl: 12 .  lamoTRIgine (LAMICTAL) 150 MG tablet, TAKE 2 TABLETS BY MOUTH 2 TIMES PER DAY, Disp: 120 tablet, Rfl: 5 .  letrozole (FEMARA) 2.5 MG tablet, TAKE 1 TABLET BY MOUTH EVERY DAY, Disp: 30 tablet, Rfl: 12 .  LORazepam (ATIVAN) 1 MG tablet, TAKE 1 TAB BY MOUTH EVERY MORNING, 1/2 TAB AT NOON AND AT BEDTIME. TAKE 1 TAB FOR SEIZURES >3 MIN OR MORE THAN 2 IN 2H, Disp: 90 tablet, Rfl: 4 .  losartan (COZAAR) 100 MG tablet, TAKE 1 TABLET BY MOUTH EVERY DAY, Disp: 30 tablet, Rfl: 11 .  pantoprazole (PROTONIX) 40 MG tablet, TAKE 1 TABLET BY MOUTH 2 TIMES PER DAY **DO NOT CRUSH**, Disp: 60 tablet, Rfl: 11 .  terbinafine (LAMISIL) 1 % cream, Use 2 times a week as  needed, Disp: 30 g, Rfl: 2 .  triamcinolone (KENALOG) 0.025 % ointment, Apply 1 application topically 2 (two) times daily as needed., Disp: , Rfl:   Review of Systems  Constitutional: Negative.   HENT: Negative.   Eyes: Negative.   Respiratory: Negative.   Cardiovascular: Negative.   Gastrointestinal: Negative.   Endocrine: Negative.   Genitourinary: Negative.   Musculoskeletal: Positive for arthralgias (right leg).  Skin: Negative.   Allergic/Immunologic: Negative.   Neurological: Negative.   Hematological: Negative.   Psychiatric/Behavioral: Negative.     Social History   Tobacco Use  . Smoking status: Never Smoker  . Smokeless tobacco: Never Used  Substance Use Topics  . Alcohol use: No      Objective:   BP 118/60 (BP Location: Left Arm, Patient Position:  Sitting, Cuff Size: Normal)   Pulse 84   Temp 97.9 F (36.6 C) (Oral)   Ht 4\' 10"  (1.473 m)   Wt 129 lb 3.2 oz (58.6 kg)   SpO2 96%   BMI 27.00 kg/m  Vitals:   04/02/18 0941  BP: 118/60  Pulse: 84  Temp: 97.9 F (36.6 C)  TempSrc: Oral  SpO2: 96%  Weight: 129 lb 3.2 oz (58.6 kg)  Height: 4\' 10"  (1.473 m)     Physical Exam Vitals signs reviewed.  Constitutional:      Appearance: Normal appearance. She is well-developed.  HENT:     Head: Atraumatic.     Right Ear: External ear normal.     Left Ear: External ear normal.     Nose: Nose normal.  Eyes:     General: No scleral icterus. Neck:     Thyroid: No thyromegaly.  Cardiovascular:     Rate and Rhythm: Normal rate and regular rhythm.     Heart sounds: Normal heart sounds.  Pulmonary:     Effort: Pulmonary effort is normal.     Breath sounds: Normal breath sounds.  Musculoskeletal:        General: No swelling.     Comments: No tenderness of the knees.  Figure-of-four maneuver normal today.  Skin:    General: Skin is warm and dry.  Neurological:     Mental Status: Mental status is at baseline.     Comments: She has what appears to be a resting tremor of both hands.  Psychiatric:        Mood and Affect: Mood normal.         Assessment & Plan    1. Tremors of nervous system Neurology has mother would like this evaluated.  I do not think this affects her day-to-day functioning. - Ambulatory referral to Neurology  2. Chronic rhinitis We will also try Flonase.  May need a ENT referral. - diphenhydrAMINE (BENADRYL) 12.5 MG chewable tablet; Chew 1 tablet (12.5 mg total) by mouth 3 (three) times daily.  Dispense: 90 tablet; Refill: 11  3. Hypertension, unspecified type   4. Closed fracture of neck of right femur with routine healing, subsequent encounter Refer back to Ortho for leg pain per caregiver.  He appears to be completely comfortable today.  5. Intellectual disability   6. Microcephaly  ([place])      Wilhemena Durie, MD  [facility]

## 2018-04-09 ENCOUNTER — Encounter: Payer: Self-pay | Admitting: Podiatry

## 2018-04-09 ENCOUNTER — Telehealth: Payer: Self-pay | Admitting: Family Medicine

## 2018-04-09 ENCOUNTER — Ambulatory Visit (INDEPENDENT_AMBULATORY_CARE_PROVIDER_SITE_OTHER): Payer: Medicare Other | Admitting: Podiatry

## 2018-04-09 DIAGNOSIS — B351 Tinea unguium: Secondary | ICD-10-CM | POA: Diagnosis not present

## 2018-04-09 DIAGNOSIS — R2689 Other abnormalities of gait and mobility: Secondary | ICD-10-CM | POA: Diagnosis not present

## 2018-04-09 DIAGNOSIS — R569 Unspecified convulsions: Secondary | ICD-10-CM | POA: Diagnosis not present

## 2018-04-09 DIAGNOSIS — M79674 Pain in right toe(s): Secondary | ICD-10-CM

## 2018-04-09 DIAGNOSIS — F411 Generalized anxiety disorder: Secondary | ICD-10-CM | POA: Diagnosis not present

## 2018-04-09 DIAGNOSIS — M79675 Pain in left toe(s): Secondary | ICD-10-CM | POA: Diagnosis not present

## 2018-04-09 DIAGNOSIS — G804 Ataxic cerebral palsy: Secondary | ICD-10-CM | POA: Diagnosis not present

## 2018-04-09 NOTE — Telephone Encounter (Signed)
[place] for 2 capsules daily?

## 2018-04-09 NOTE — Progress Notes (Signed)
Complaint:  Visit Type: Patient returns to my office for continued preventative foot care services. Complaint: Patient states" my nails have grown long and thick and become painful to walk and wear shoes" Patient has been diagnosed with CP and mental retardation.. The patient presents for preventative foot care services. No changes to ROS.  Patient is diabetic.  Podiatric Exam: Vascular: dorsalis pedis and posterior tibial pulses are palpable bilateral. Capillary return is immediate. Temperature gradient is WNL. Skin turgor WNL  Sensorium: deferred Nail Exam: Pt has thick disfigured discolored nails with subungual debris noted bilateral entire nail hallux through fifth toenails Ulcer Exam: There is no evidence of ulcer or pre-ulcerative changes or infection. Orthopedic Exam: Muscle tone and strength are WNL. No limitations in general ROM. No crepitus or effusions noted. Foot type and digits show no abnormalities. Bony prominences are unremarkable. Skin: No Porokeratosis. No infection or ulcers  Diagnosis:  Onychomycosis, , Pain in right toe, pain in left toes  Treatment & Plan Procedures and Treatment: Consent by patient was obtained for treatment procedures.   Debridement of mycotic and hypertrophic toenails, 1 through 5 bilateral and clearing of subungual debris. No ulceration, no infection noted. Nails were cut short using the nipper but I was unable to use the dremel tool due to her continuous motion.   Return Visit-Office Procedure: Patient instructed to return to the office for a follow up visit 4 months for continued evaluation and treatment.    Gardiner Barefoot DPM

## 2018-04-09 NOTE — Telephone Encounter (Signed)
Thomas pharmacist with [facility], [address] [phone] (Phone) [phone] (Fax)  States pt's insurance will no longer pay for pt to have 3 capsules a day.  They will pay for 2 capsules a day of the DULoxetine (CYMBALTA) 30 MG capsule.  Please call Marcello Moores back to discuss asap.  Thanks, [employer]

## 2018-04-09 NOTE — Telephone Encounter (Signed)
ok 

## 2018-04-10 ENCOUNTER — Telehealth: Payer: Self-pay | Admitting: Family Medicine

## 2018-04-10 MED ORDER — DULOXETINE HCL 30 MG PO CPEP: 30.0000 mg | ORAL_CAPSULE | Freq: Every day | ORAL | 11 refills | Status: DC

## 2018-04-10 MED ORDER — DULOXETINE HCL 30 MG PO CPEP: 60.0000 mg | ORAL_CAPSULE | Freq: Every day | ORAL | 11 refills | Status: DC

## 2018-04-10 MED ORDER — DULOXETINE HCL 60 MG PO CPEP: 60.0000 mg | ORAL_CAPSULE | Freq: Every day | ORAL | 11 refills | Status: AC

## 2018-04-10 NOTE — Telephone Encounter (Signed)
Sent into the pharmacy. Dr. Rosanna Randy ok'd this on 04/09/2018.

## 2018-04-10 NOTE — Telephone Encounter (Signed)
Helen Patton with [facility] called to see if he can get an answer on the  Generic Cymbalta.  Pt takes 90 mg a day but insurance will not pay for 3 times a day for 30 mgs each but will pay for twice a day.  Given in a 30 mg dosage and 60 mg.  CB#  [phone]  Helen Patton ask if we would send this message to [facility] since Dr. Rosanna Randy is not here.  He would like an answer today if possible   Thanks teri

## 2018-04-10 NOTE — Telephone Encounter (Signed)
Please call Marcello Moores back at [facility] at [phone] to discuss variances to give pt the 90 mg.  Thanks, [employer]

## 2018-04-10 NOTE — Telephone Encounter (Signed)
Done

## 2018-04-11 ENCOUNTER — Emergency Department
Admission: EM | Admit: 2018-04-11 | Discharge: 2018-04-11 | Disposition: A | Discharge status: Home / Self Care | Payer: Medicare Other | Attending: Emergency Medicine | Admitting: Emergency Medicine

## 2018-04-11 DIAGNOSIS — N939 Abnormal uterine and vaginal bleeding, unspecified: Secondary | ICD-10-CM

## 2018-04-11 DIAGNOSIS — F79 Unspecified intellectual disabilities: Secondary | ICD-10-CM | POA: Insufficient documentation

## 2018-04-11 DIAGNOSIS — F419 Anxiety disorder, unspecified: Secondary | ICD-10-CM | POA: Insufficient documentation

## 2018-04-11 DIAGNOSIS — Z79899 Other long term (current) drug therapy: Secondary | ICD-10-CM | POA: Diagnosis not present

## 2018-04-11 DIAGNOSIS — G809 Cerebral palsy, unspecified: Secondary | ICD-10-CM | POA: Insufficient documentation

## 2018-04-11 DIAGNOSIS — Z96641 Presence of right artificial hip joint: Secondary | ICD-10-CM | POA: Diagnosis not present

## 2018-04-11 DIAGNOSIS — F329 Major depressive disorder, single episode, unspecified: Secondary | ICD-10-CM | POA: Insufficient documentation

## 2018-04-11 DIAGNOSIS — Z853 Personal history of malignant neoplasm of breast: Secondary | ICD-10-CM | POA: Diagnosis not present

## 2018-04-11 DIAGNOSIS — I10 Essential (primary) hypertension: Secondary | ICD-10-CM | POA: Insufficient documentation

## 2018-04-11 LAB — COMPREHENSIVE METABOLIC PANEL
ALT: 13 U/L (ref 0–44)
AST: 16 U/L (ref 15–41)
Albumin: 4.1 g/dL (ref 3.5–5.0)
Alkaline Phosphatase: 53 U/L (ref 38–126)
Anion gap: 7 (ref 5–15)
BUN: 19 mg/dL (ref 6–20)
CO2: 31 mmol/L (ref 22–32)
Calcium: 9.1 mg/dL (ref 8.9–10.3)
Chloride: 101 mmol/L (ref 98–111)
Creatinine, Ser: 0.84 mg/dL (ref 0.44–1.00)
GFR calc Af Amer: >60 mL/min (ref >60)
GFR calc non Af Amer: >60 mL/min (ref >60)
Glucose, Bld: 99 mg/dL (ref 70–99)
Potassium: 3.6 mmol/L (ref 3.5–5.1)
Sodium: 139 mmol/L (ref 135–145)
Total Bilirubin: 0.4 mg/dL (ref 0.3–1.2)
Total Protein: 6.6 g/dL (ref 6.5–8.1)

## 2018-04-11 LAB — CBC
HCT: 34.8 % — ABNORMAL LOW (ref 36.0–46.0)
Hemoglobin: 10.7 g/dL — ABNORMAL LOW (ref 12.0–15.0)
MCH: 28.7 pg (ref 26.0–34.0)
MCHC: 30.7 g/dL (ref 30.0–36.0)
MCV: 93.3 fL (ref 80.0–100.0)
Platelets: 329 10*3/uL (ref 150–400)
RBC: 3.73 MIL/uL — AB (ref 3.87–5.11)
RDW: 14.8 % (ref 11.5–15.5)
WBC: 8.5 10*3/uL (ref 4.0–10.5)
nRBC: 0 % (ref 0.0–0.2)

## 2018-04-11 NOTE — ED Notes (Signed)
Per pt caregiver she was told by 3rd shift that there was blood in pt pull-up. This AM did not see any blood. 1 hr after BM caregiver states blood in toilet. Caregiver states pt had bright red blood in toilet.   Upon inspection with MD blood appears to be coming from vagina.

## 2018-04-11 NOTE — ED Provider Notes (Signed)
[facility] Emergency Department Provider Note   ____________________________________________    I have reviewed the triage vital signs and the nursing notes.   HISTORY  Chief Complaint Rectal Bleeding and Vaginal Bleeding     HPI Helen Patton is a 58 y.o. female who presents with complaints of bleeding from the rectum or vagina.  Patient with significant intellectual disability cared for by [name] brought in because staff have noticed blood in her diaper, unclear where this is come from.  Apparently started yesterday.  No history of the same.  Not on blood thinners.  Patient has no complaints  Past Medical History:  Diagnosis Date  . Anxiety   . Arthritis    osteoarthritis  . Breast cancer ([place]) 2015   [name]  . Cancer ([place]) 11/2013   T1c, Nx; ER/ PR +; Her 2 neg invasive mammary carcinoma left breast. Wide excision only based on Tumor Board review.   . Cataract   . Depression   . Difficulty swallowing   . Falls   . GERD (gastroesophageal reflux disease)   . Hidradenitis 2012   MRSA, on chronic topical suppression  . History of hiatal hernia   . Hyperlipidemia   . Hypertension   . Mental retardation   . MRSA (methicillin resistant staph aureus) culture positive 2012  . Osteoporosis   . Pneumonia    aspiration pneumonia  . Seizures [facility])    last seizure February 2017    Patient Active Problem List   Diagnosis Date Noted  . B12 deficiency 05/17/2016  . S/P total hip arthroplasty 08/23/2015  . Acute respiratory failure with hypoxia ([place]) 05/19/2015  . 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 Closed fracture of neck of right femur ([place]) 03/07/2015  . Anxiety 12/14/2014  . Arthralgia of hip 12/14/2014  . Athlete's foot 12/14/2014  . Cerebral palsy ([place]) 12/14/2014  . Cognitive decline 12/14/2014  . Clinical depression 12/14/2014  . Acid reflux 12/14/2014  . HLD (hyperlipidemia) 12/14/2014  .  BP (high blood pressure) 12/14/2014  . Methicillin resistant Staphylococcus aureus infection 12/14/2014  . Adiposity 12/14/2014  . Breast cancer ([place]) 11/21/2013  . Intellectual disability 11/16/2013  . Seizure ([place]) 09/30/2013    Past Surgical History:  Procedure Laterality Date  . BREAST LUMPECTOMY Left 12/07/2013   T1b, NX, ER/PR positive, HER-2/neu not overexpressed, 7 mm.  Not candidate for chemotherapy/sentinel node/radiation based on mental retardation.  Marland Kitchen BREAST SURGERY Left 12/07/2013   T1b, Nx; ER+, PR+, her 2 not amplified. (Not candidate for chemotherapy/radiation based on mental retardation)  . broken leg    . CERVICAL ABLATION    . 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 dislocated hip    . HARDWARE REMOVAL  08/23/2015   Procedure: HARDWARE REMOVAL;  Surgeon: Dereck Leep, MD;  Location: [facility] ORS;  Service: Orthopedics;;  . TOTAL HIP REVISION Right 08/23/2015   Procedure: TOTAL HIP REVISION;  Surgeon: Dereck Leep, MD;  Location: [facility] ORS;  Service: Orthopedics;  Laterality: Right;    Prior to Admission medications   Medication Sig Start Date End Date Taking? Authorizing Provider  acetaminophen (TYLENOL) 325 MG tablet Take 2 tablets (650 mg total) by mouth every 6 (six) hours as needed. 07/14/17   Jerrol Banana., MD  alendronate (FOSAMAX) 70 MG tablet TAKE 1 TABLET  BY MOUTH WEEKLY EARLY MORNING BEFORE FOOD/MEDS WITH WATER. DO NOT LIE DOWN FOR 30 MINUTES 08/13/17   Jerrol Banana., MD  amLODipine (NORVASC) 10 MG tablet TAKE 1 TABLET BY MOUTH AT BEDTIME 08/13/17   Jerrol Banana., MD  BENZOYL PEROXIDE 5 % external wash USE TOPICALLY ONCE DAILY 01/22/18   Jerrol Banana., MD  calcium-vitamin D (OSCAL WITH D) 500-200 MG-UNIT TABS tablet TAKE 1 TABLET BY MOUTH EVERY DAY WITH BREAKFAST 08/13/17   Jerrol Banana., MD  Cholecalciferol  (VITAMIN D) 2000 units tablet TAKE 1 TABLET BY MOUTH EACH DAY 09/04/17   Jerrol Banana., MD  clindamycin (CLEOCIN T) 1 % external solution Apply topically daily. 07/14/17   Jerrol Banana., MD  clotrimazole (LOTRIMIN) 1 % cream APPLY TOPICALLY TWICE A DAY AS NEEDED FOR RASH IN SKIN FOLDS 02/05/17   Jerrol Banana., MD  coal tar (NEUTROGENA T-GEL) 0.5 % shampoo Use as directed 01/30/18   Jerrol Banana., MD  cyanocobalamin (,VITAMIN B-12,) 1000 MCG/ML injection GIVE 1000 MCG INJECTION "IM" ONCE A MONTH (B-12 SUPPLEMENT) 08/19/17   Jerrol Banana., MD  diphenhydrAMINE (BENADRYL) 12.5 MG chewable tablet Chew 1 tablet (12.5 mg total) by mouth 3 (three) times daily. 04/02/18   Jerrol Banana., MD  docusate sodium (COLACE) 100 MG capsule TAKE 1 CAPSULE BY MOUTH 2 TIMES PER DAY 08/13/17   Jerrol Banana., MD  DULoxetine (CYMBALTA) 30 MG capsule Take 1 capsule (30 mg total) by mouth daily. 04/10/18   Jerrol Banana., MD  DULoxetine (CYMBALTA) 60 MG capsule Take 1 capsule (60 mg total) by mouth daily. 04/10/18   Jerrol Banana., MD  ipratropium (ATROVENT) 0.03 % nasal spray Place 2 sprays into both nostrils 3 (three) times daily. 02/24/18   Virginia Crews, MD  lamoTRIgine (LAMICTAL) 150 MG tablet TAKE 2 TABLETS BY MOUTH 2 TIMES PER DAY 03/08/18   Jerrol Banana., MD  letrozole [facility]) 2.5 MG tablet TAKE 1 TABLET BY MOUTH EVERY DAY 08/13/17   Jerrol Banana., MD  LORazepam (ATIVAN) 1 MG tablet TAKE 1 TAB BY MOUTH EVERY MORNING, 1/2 TAB AT NOON AND AT BEDTIME. TAKE 1 TAB FOR SEIZURES >3 MIN OR MORE THAN 2 IN 2H 12/10/17   Jerrol Banana., MD  losartan (COZAAR) 100 MG tablet TAKE 1 TABLET BY MOUTH EVERY DAY 08/13/17   Jerrol Banana., MD  pantoprazole (PROTONIX) 40 MG tablet TAKE 1 TABLET BY MOUTH 2 TIMES PER DAY **DO NOT CRUSH** 09/08/17   Jerrol Banana., MD  terbinafine (LAMISIL) 1 % cream Use 2 times a week as needed  07/14/17   Jerrol Banana., MD  triamcinolone (KENALOG) 0.025 % ointment Apply 1 application topically 2 (two) times daily as needed.    [provider]     Allergies Sulfa antibiotics and Keflex [cephalexin]  Family History  Problem Relation Age of Onset  . Diabetes Father   . Parkinson's disease Father   . Neuropathy Mother   . Arthritis Mother   . Diabetes Mother   . Autoimmune disease Brother        AIDS  . Cancer Maternal Aunt 63       breast  . Breast cancer Maternal Aunt   . Hypertension Maternal Grandmother   . Cancer Paternal Grandmother     Social History Social History   Tobacco  Use  . Smoking status: Never Smoker  . Smokeless tobacco: Never Used  Substance Use Topics  . Alcohol use: No  . Drug use: No    Review of Systems  Constitutional: No fever    Respiratory: No cough Gastrointestinal: No reports of abdominal pain Genitourinary: No reports of dysuria foul-smelling urine  Skin: Negative for pallor Neurological: Negative forweakness   ____________________________________________   PHYSICAL EXAM:  VITAL SIGNS: ED Triage Vitals [04/11/18 1235]  Enc Vitals Group     BP (!) 108/54     Pulse Rate 82     Resp 14     Temp 97.9 F (36.6 C)     Temp Source Oral     SpO2 96 %     Weight      Height      Head Circumference      Peak Flow      Pain Score      Pain Loc      Pain Edu?      Excl. in [place]?     Constitutional: Alert    Mouth/Throat: Mucous membranes are moist.    Cardiovascular: Normal rate, regular rhythm. Grossly normal heart sounds.  Good peripheral circulation. Respiratory: Normal respiratory effort.  No retractions. Lungs CTAB. Gastrointestinal: Soft and nontender. No distention.   Genitourinary: Bleeding is from the vagina Musculoskeletal:   Warm and well perfused Neurologic:  Normal speech and language. No gross focal neurologic deficits are appreciated.  Skin:  Skin is warm, dry and intact. No rash  noted. Psychiatric: Mood and affect are normal. Speech and behavior are normal.  ____________________________________________   LABS (all labs ordered are listed, but only abnormal results are displayed)  Labs Reviewed  CBC - Abnormal; Notable for the following components:      Result Value   RBC 3.73 (*)    Hemoglobin 10.7 (*)    HCT 34.8 (*)    All other components within normal limits  COMPREHENSIVE METABOLIC PANEL   ____________________________________________  EKG  None ____________________________________________  RADIOLOGY  None ____________________________________________   PROCEDURES  Procedure(s) performed: No  Procedures   Critical Care performed: No ____________________________________________   INITIAL IMPRESSION / ASSESSMENT AND PLAN / ED COURSE  Pertinent labs & imaging results that were available during my care of the patient were reviewed by me and considered in my medical decision making (see chart for details).  On exam bleeding is mild and vaginal, she will require follow-up with GYN for further evaluation given her age.    ____________________________________________   FINAL CLINICAL IMPRESSION(S) / ED DIAGNOSES  Final diagnoses:  Vaginal bleeding        Note:  This document was prepared using Dragon voice recognition software and may include unintentional dictation errors.   Lavonia Drafts, MD 04/11/18 [phone]

## 2018-04-11 NOTE — ED Notes (Signed)
Legal guardian notified pt being seen in ED.

## 2018-04-11 NOTE — ED Notes (Signed)
Pt has legal guardian per caregivers.  Mother, Laelia Ph [phone].

## 2018-04-11 NOTE — Discharge Instructions (Addendum)
Please follow-up with gynecology as we discussed

## 2018-04-11 NOTE — ED Triage Notes (Signed)
Pt presents via POV from [name] for vaginal or rectal bleeding. Staff unsure as to which. Started yesterday. Bright red per staff report. Pt nonverbal at baseline.

## 2018-04-15 ENCOUNTER — Encounter: Payer: Medicare Other | Admitting: Obstetrics and Gynecology

## 2018-04-16 ENCOUNTER — Ambulatory Visit (INDEPENDENT_AMBULATORY_CARE_PROVIDER_SITE_OTHER): Payer: Medicare Other | Admitting: Physician Assistant

## 2018-04-16 ENCOUNTER — Encounter: Payer: Self-pay | Admitting: Physician Assistant

## 2018-04-16 VITALS — BP 120/64 | HR 85 | Temp 98.2°F | Resp 16 | Wt 127.0 lb

## 2018-04-16 DIAGNOSIS — K529 Noninfective gastroenteritis and colitis, unspecified: Secondary | ICD-10-CM | POA: Diagnosis not present

## 2018-04-16 NOTE — Patient Instructions (Signed)
Viral Gastroenteritis, Adult    Viral gastroenteritis is also known as the stomach flu. This condition is caused by certain germs (viruses). These germs can be passed from person to person very easily (are very contagious). This condition can cause sudden watery poop (diarrhea), fever, and throwing up (vomiting).  Having watery poop and throwing up can make you feel weak and cause you to get dehydrated. Dehydration can make you tired and thirsty, make you have a dry mouth, and make it so you pee (urinate) less often. Older adults and people with other diseases or a weak defense system (immune system) are at higher risk for dehydration. It is important to replace the fluids that you lose from having watery poop and throwing up.  Follow these instructions at home:  Follow instructions from your doctor about how to care for yourself at home.  Eating and drinking  Follow these instructions as told by your doctor:   Take an oral rehydration solution (ORS). This is a drink that is sold at pharmacies and stores.   Drink clear fluids in small amounts as you are able, such as:  ? Water.  ? Ice chips.  ? Diluted fruit juice.  ? Low-calorie sports drinks.   Eat bland, easy-to-digest foods in small amounts as you are able, such as:  ? Bananas.  ? Applesauce.  ? Rice.  ? Low-fat (lean) meats.  ? Toast.  ? Crackers.   Avoid fluids that have a lot of sugar or caffeine in them.   Avoid alcohol.   Avoid spicy or fatty foods.  General instructions     Drink enough fluid to keep your pee (urine) clear or pale yellow.   Wash your hands often. If you cannot use soap and water, use hand sanitizer.   Make sure that all people in your home wash their hands well and often.   Rest at home while you get better.   Take over-the-counter and prescription medicines only as told by your doctor.   Watch your condition for any changes.   Take a warm bath to help with any burning or pain from having watery poop.   Keep all follow-up  visits as told by your doctor. This is important.  Contact a doctor if:   You cannot keep fluids down.   Your symptoms get worse.   You have new symptoms.   You feel light-headed or dizzy.   You have muscle cramps.  Get help right away if:   You have chest pain.   You feel very weak or you pass out (faint).   You see blood in your throw-up.   Your throw-up looks like coffee grounds.   You have bloody or black poop (stools) or poop that look like tar.   You have a very bad headache, a stiff neck, or both.   You have a rash.   You have very bad pain, cramping, or bloating in your belly (abdomen).   You have trouble breathing.   You are breathing very quickly.   Your heart is beating very quickly.   Your skin feels cold and clammy.   You feel confused.   You have pain when you pee.   You have signs of dehydration, such as:  ? Dark pee, hardly any pee, or no pee.  ? Cracked lips.  ? Dry mouth.  ? Sunken eyes.  ? Sleepiness.  ? Weakness.  This information is not intended to replace advice given to you by your   health care provider. Make sure you discuss any questions you have with your health care provider.  Document Released: 07/17/2007 Document Revised: 10/22/2017 Document Reviewed: 10/04/2014  Elsevier Interactive Patient Education  2019 Elsevier Inc[employer]

## 2018-04-16 NOTE — Progress Notes (Signed)
Patient: Helen Patton Female    DOB: [birth year]   58 y.o.   MRN: [id number] Visit Date: 04/16/2018  Today's Provider: Trinna Post, PA-C   Chief Complaint  Patient presents with  . Diarrhea   Subjective:    Patient has an intellectual disability and is cared for at the [facility]. A caregiver accompanies her today and she is a new caregiver to the patient, she does not know much about her. She believes the patient has had diarrhea for around two days. She does not know how many bowel movements she has had per day. She does not know if she has had antibiotics in 3 months. She does not believe she has had hospitalizations recently. She does not think she has blood or mucous in the stool but is not sure. She does not believe there has been vomiting. She reports patient has been eating and drinking as normal.   Diarrhea   This is a new problem. The current episode started in the past 7 days. The problem has been unchanged. Associated symptoms include abdominal pain and headaches. Pertinent negatives include no fever or vomiting. The treatment provided no relief.    Allergies  Allergen Reactions  . Sulfa Antibiotics Other (See Comments)    "Does not know"  . Keflex [Cephalexin] Other (See Comments)    "Does not know" unknown     Current Outpatient Medications:  .  acetaminophen (TYLENOL) 325 MG tablet, Take 2 tablets (650 mg total) by mouth every 6 (six) hours as needed., Disp: 100 tablet, Rfl: 11 .  alendronate (FOSAMAX) 70 MG tablet, TAKE 1 TABLET  BY MOUTH WEEKLY EARLY MORNING BEFORE FOOD/MEDS WITH WATER. DO NOT LIE DOWN FOR 30 MINUTES, Disp: 4 tablet, Rfl: 12 .  amLODipine (NORVASC) 10 MG tablet, TAKE 1 TABLET BY MOUTH AT BEDTIME, Disp: 30 tablet, Rfl: 12 .  BENZOYL PEROXIDE 5 % external wash, USE TOPICALLY ONCE DAILY, Disp: 150 g, Rfl: 3 .  calcium-vitamin D (OSCAL WITH D) 500-200 MG-UNIT TABS tablet, TAKE 1 TABLET BY MOUTH EVERY DAY WITH BREAKFAST, Disp: 30  each, Rfl: 11 .  Cholecalciferol (VITAMIN D) 2000 units tablet, TAKE 1 TABLET BY MOUTH EACH DAY, Disp: 30 tablet, Rfl: 12 .  clindamycin (CLEOCIN T) 1 % external solution, Apply topically daily., Disp: 60 mL, Rfl: 12 .  clotrimazole (LOTRIMIN) 1 % cream, APPLY TOPICALLY TWICE A DAY AS NEEDED FOR RASH IN SKIN FOLDS, Disp: 30 g, Rfl: 0 .  coal tar (NEUTROGENA T-GEL) 0.5 % shampoo, Use as directed, Disp: 30 mL, Rfl: 11 .  cyanocobalamin (,VITAMIN B-12,) 1000 MCG/ML injection, GIVE 1000 MCG INJECTION "IM" ONCE A MONTH (B-12 SUPPLEMENT), Disp: 1 mL, Rfl: 11 .  diphenhydrAMINE (BENADRYL) 12.5 MG chewable tablet, Chew 1 tablet (12.5 mg total) by mouth 3 (three) times daily., Disp: 90 tablet, Rfl: 11 .  docusate sodium (COLACE) 100 MG capsule, TAKE 1 CAPSULE BY MOUTH 2 TIMES PER DAY, Disp: 60 capsule, Rfl: 12 .  DULoxetine (CYMBALTA) 30 MG capsule, Take 1 capsule (30 mg total) by mouth daily., Disp: 30 capsule, Rfl: 11 .  DULoxetine (CYMBALTA) 60 MG capsule, Take 1 capsule (60 mg total) by mouth daily., Disp: 30 capsule, Rfl: 11 .  ipratropium (ATROVENT) 0.03 % nasal spray, Place 2 sprays into both nostrils 3 (three) times daily., Disp: 30 mL, Rfl: 12 .  lamoTRIgine (LAMICTAL) 150 MG tablet, TAKE 2 TABLETS BY MOUTH 2 TIMES PER DAY, Disp: 120  tablet, Rfl: 5 .  letrozole (FEMARA) 2.5 MG tablet, TAKE 1 TABLET BY MOUTH EVERY DAY, Disp: 30 tablet, Rfl: 12 .  LORazepam (ATIVAN) 1 MG tablet, TAKE 1 TAB BY MOUTH EVERY MORNING, 1/2 TAB AT NOON AND AT BEDTIME. TAKE 1 TAB FOR SEIZURES >3 MIN OR MORE THAN 2 IN 2H, Disp: 90 tablet, Rfl: 4 .  losartan (COZAAR) 100 MG tablet, TAKE 1 TABLET BY MOUTH EVERY DAY, Disp: 30 tablet, Rfl: 11 .  pantoprazole (PROTONIX) 40 MG tablet, TAKE 1 TABLET BY MOUTH 2 TIMES PER DAY **DO NOT CRUSH**, Disp: 60 tablet, Rfl: 11 .  terbinafine (LAMISIL) 1 % cream, Use 2 times a week as needed, Disp: 30 g, Rfl: 2 .  triamcinolone (KENALOG) 0.025 % ointment, Apply 1 application topically 2 (two)  times daily as needed., Disp: , Rfl:   Review of Systems  Constitutional: Negative for fever.  Gastrointestinal: Positive for abdominal pain and diarrhea. Negative for vomiting.  Neurological: Positive for headaches.    Social History   Tobacco Use  . Smoking status: Never Smoker  . Smokeless tobacco: Never Used  Substance Use Topics  . Alcohol use: No      Objective:   BP 120/64   Pulse 85   Temp 98.2 F (36.8 C) (Oral)   Resp 16   Wt 127 lb (57.6 kg)   BMI 26.54 kg/m  Vitals:   04/16/18 1024  BP: 120/64  Pulse: 85  Resp: 16  Temp: 98.2 F (36.8 C)  TempSrc: Oral  Weight: 127 lb (57.6 kg)     Physical Exam Constitutional:      Appearance: Normal appearance. She is normal weight.  Cardiovascular:     Rate and Rhythm: Normal rate and regular rhythm.     Heart sounds: Normal heart sounds.  Pulmonary:     Effort: Pulmonary effort is normal.     Breath sounds: Normal breath sounds.  Abdominal:     General: Abdomen is flat. Bowel sounds are normal. There is no distension.     Palpations: Abdomen is soft.     Tenderness: There is no abdominal tenderness.  Skin:    General: Skin is warm and dry.  Neurological:     Mental Status: She is oriented to person, place, and time. Mental status is at baseline.  Psychiatric:        Mood and Affect: Mood normal.         Assessment & Plan    1. Gastroenteritis  Patient has diarrhea for approximately two days, though much information is uncertain as the caregiver is not familiar with her. She did have an episode of vaginal bleeding and has an upcoming appointment with OBGYN. She does appear well on today's exam with normal vital signs, no signs of dehydration, and normal bowel sounds. Recommend watchful waiting, pushing fluids, and advising bland foods at tolerated. Her caregiver has been advised to watch for fever, very watery stools, blood or mucous in stools, and signs of abdominal pain or discomfort. I have sent home  a stool collection kit to be returned with a sample if symptoms persist > 1 week. I think this may be viral and could be self limiting, but will send this home as a precaution.   - GI Profile, Stool, PCR  The entirety of the information documented in the History of Present Illness, Review of Systems and Physical Exam were personally obtained by me. Portions of this information were initially documented by Jennings Books, CMA  and reviewed by me for thoroughness and accuracy.   F/u PRN.       Trinna Post, PA-C  [facility]

## 2018-04-22 ENCOUNTER — Encounter: Payer: Self-pay | Admitting: Obstetrics and Gynecology

## 2018-04-22 ENCOUNTER — Ambulatory Visit (INDEPENDENT_AMBULATORY_CARE_PROVIDER_SITE_OTHER): Payer: Medicare Other | Admitting: Obstetrics and Gynecology

## 2018-04-22 ENCOUNTER — Other Ambulatory Visit: Payer: Self-pay

## 2018-04-22 VITALS — BP 124/76 | Ht 59.0 in

## 2018-04-22 DIAGNOSIS — N950 Postmenopausal bleeding: Secondary | ICD-10-CM | POA: Insufficient documentation

## 2018-04-22 NOTE — Progress Notes (Signed)
Obstetrics & Gynecology Office Visit   Chief Complaint  Patient presents with  . Follow-up  Vaginal bleeding from ER visit on 04/11/2018  History of Present Illness: 58 y.o.  female who has some cognitive issues who is cared for by [name]. She has lived there since age 1 years.  She presents in follow up from an ER visit.  She had been having vaginal bleeding. The blood was bright red.  The bleeding started just prior to her ER visit and lasted 4-5 days.  There has been no bleeding since that time.  She had a large bowel movement and then the bleeding stopped.  Her mother is also present. They are unclear as to what age she underwent menopause.  Neither she nor her mother remembers when she last had a pap smear.  She has had no unexplained weight loss.  She has had a normal appetite.  She has long-standing constipation.  She had no imaging in the emergency room.  The entirety of the history was obtained from her mother and caregiver, as the patient is non-verbal.   Past Medical History:  Diagnosis Date  . Anxiety   . Arthritis    osteoarthritis  . Breast cancer ([place]) 2015   [name]  . Cancer ([place]) 11/2013   T1c, Nx; ER/ PR +; Her 2 neg invasive mammary carcinoma left breast. Wide excision only based on Tumor Board review.   . Cataract   . Depression   . Difficulty swallowing   . Falls   . GERD (gastroesophageal reflux disease)   . Hidradenitis 2012   MRSA, on chronic topical suppression  . History of hiatal hernia   . Hyperlipidemia   . Hypertension   . Mental retardation   . MRSA (methicillin resistant staph aureus) culture positive 2012  . Osteoporosis   . Pneumonia    aspiration pneumonia  . Seizures [facility])    last seizure February 2017   Past Surgical History:  Procedure Laterality Date  . BREAST LUMPECTOMY Left 12/07/2013   T1b, NX, ER/PR positive, HER-2/neu not overexpressed, 7 mm.  Not candidate for chemotherapy/sentinel node/radiation based on  mental retardation.  [facility] Kitchen BREAST SURGERY Left 12/07/2013   T1b, Nx; ER+, PR+, her 2 not amplified. (Not candidate for chemotherapy/radiation based on mental retardation)  . broken leg    . CERVICAL ABLATION    . 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 dislocated hip    . HARDWARE REMOVAL  08/23/2015   Procedure: HARDWARE REMOVAL;  Surgeon: Dereck Leep, MD;  Location: [facility] ORS;  Service: Orthopedics;;  . TOTAL HIP REVISION Right 08/23/2015   Procedure: TOTAL HIP REVISION;  Surgeon: Dereck Leep, MD;  Location: [facility] ORS;  Service: Orthopedics;  Laterality: Right;   Gynecologic History: No LMP recorded. Patient is postmenopausal.  Obstetric History: G0  Family History  Problem Relation Age of Onset  . Diabetes Father   . Parkinson's disease Father   . Neuropathy Mother   . Arthritis Mother   . Diabetes Mother   . Autoimmune disease Brother        AIDS  . Cancer Maternal Aunt 63       breast  . Breast cancer Maternal Aunt   . Hypertension Maternal Grandmother   . Cancer Paternal Grandmother     Social History   Socioeconomic History  . Marital  status: Single    Spouse name: Not on file  . Number of children: Not on file  . Years of education: Not on file  . Highest education level: Not on file  Occupational History  . Not on file  Social Needs  . Financial resource strain: Not on file  . Food insecurity:    Worry: Not on file    Inability: Not on file  . Transportation needs:    Medical: Not on file    Non-medical: Not on file  Tobacco Use  . Smoking status: Never Smoker  . Smokeless tobacco: Never Used  Substance and Sexual Activity  . Alcohol use: No  . Drug use: No  . Sexual activity: Never  Lifestyle  . Physical activity:    Days per week: Not on file    Minutes per session: Not on file  . Stress: Not on file  Relationships  . Social  connections:    Talks on phone: Not on file    Gets together: Not on file    Attends religious service: Not on file    Active member of club or organization: Not on file    Attends meetings of clubs or organizations: Not on file    Relationship status: Not on file  . Intimate partner violence:    Fear of current or ex partner: Not on file    Emotionally abused: Not on file    Physically abused: Not on file    Forced sexual activity: Not on file  Other Topics Concern  . Not on file  Social History Narrative  . Not on file    Allergies  Allergen Reactions  . Sulfa Antibiotics Other (See Comments)    "Does not know"  . Keflex [Cephalexin] Other (See Comments)    "Does not know" unknown    Prior to Admission medications   Medication Sig Start Date End Date Taking? Authorizing Provider  acetaminophen (TYLENOL) 325 MG tablet Take 2 tablets (650 mg total) by mouth every 6 (six) hours as needed. 07/14/17   Jerrol Banana., MD  alendronate (FOSAMAX) 70 MG tablet TAKE 1 TABLET  BY MOUTH WEEKLY EARLY MORNING BEFORE FOOD/MEDS WITH WATER. DO NOT LIE DOWN FOR 30 MINUTES 08/13/17   Jerrol Banana., MD  amLODipine (NORVASC) 10 MG tablet TAKE 1 TABLET BY MOUTH AT BEDTIME 08/13/17   Jerrol Banana., MD  BENZOYL PEROXIDE 5 % external wash USE TOPICALLY ONCE DAILY 01/22/18   Jerrol Banana., MD  calcium-vitamin D (OSCAL WITH D) 500-200 MG-UNIT TABS tablet TAKE 1 TABLET BY MOUTH EVERY DAY WITH BREAKFAST 08/13/17   Jerrol Banana., MD  Cholecalciferol (VITAMIN D) 2000 units tablet TAKE 1 TABLET BY MOUTH EACH DAY 09/04/17   Jerrol Banana., MD  clindamycin (CLEOCIN T) 1 % external solution Apply topically daily. 07/14/17   Jerrol Banana., MD  clotrimazole (LOTRIMIN) 1 % cream APPLY TOPICALLY TWICE A DAY AS NEEDED FOR RASH IN SKIN FOLDS 02/05/17   Jerrol Banana., MD  coal tar (NEUTROGENA T-GEL) 0.5 % shampoo Use as directed 01/30/18   Jerrol Banana., MD  cyanocobalamin (,VITAMIN B-12,) 1000 MCG/ML injection GIVE 1000 MCG INJECTION "IM" ONCE A MONTH (B-12 SUPPLEMENT) 08/19/17   Jerrol Banana., MD  diphenhydrAMINE (BENADRYL) 12.5 MG chewable tablet Chew 1 tablet (12.5 mg total) by mouth 3 (three) times daily. 04/02/18   Jerrol Banana., MD  docusate sodium (COLACE) 100 MG capsule TAKE 1 CAPSULE BY MOUTH 2 TIMES PER DAY 08/13/17   Jerrol Banana., MD  DULoxetine (CYMBALTA) 30 MG capsule Take 1 capsule (30 mg total) by mouth daily. 04/10/18   Jerrol Banana., MD  DULoxetine (CYMBALTA) 60 MG capsule Take 1 capsule (60 mg total) by mouth daily. 04/10/18   Jerrol Banana., MD  ipratropium (ATROVENT) 0.03 % nasal spray Place 2 sprays into both nostrils 3 (three) times daily. 02/24/18   Virginia Crews, MD  lamoTRIgine (LAMICTAL) 150 MG tablet TAKE 2 TABLETS BY MOUTH 2 TIMES PER DAY 03/08/18   Jerrol Banana., MD  letrozole [facility]) 2.5 MG tablet TAKE 1 TABLET BY MOUTH EVERY DAY 08/13/17   Jerrol Banana., MD  LORazepam (ATIVAN) 1 MG tablet TAKE 1 TAB BY MOUTH EVERY MORNING, 1/2 TAB AT NOON AND AT BEDTIME. TAKE 1 TAB FOR SEIZURES >3 MIN OR MORE THAN 2 IN 2H 12/10/17   Jerrol Banana., MD  losartan (COZAAR) 100 MG tablet TAKE 1 TABLET BY MOUTH EVERY DAY 08/13/17   Jerrol Banana., MD  pantoprazole (PROTONIX) 40 MG tablet TAKE 1 TABLET BY MOUTH 2 TIMES PER DAY **DO NOT CRUSH** 09/08/17   Jerrol Banana., MD  terbinafine (LAMISIL) 1 % cream Use 2 times a week as needed 07/14/17   Jerrol Banana., MD  triamcinolone (KENALOG) 0.025 % ointment Apply 1 application topically 2 (two) times daily as needed.    [provider]    Review of Systems  Constitutional: Negative.   HENT: Negative.   Eyes: Negative.   Respiratory: Negative.   Cardiovascular: Negative.   Gastrointestinal: Negative.   Genitourinary: Negative.   Musculoskeletal: Negative.   Skin: Negative.    Neurological: Negative.   Psychiatric/Behavioral: Negative.      Physical Exam BP 124/76   Ht 4' 11"  (1.499 m)   BMI 25.65 kg/m  No LMP recorded. Patient is postmenopausal. Physical Exam Constitutional:      General: She is not in acute distress.    Appearance: Normal appearance.  Genitourinary:     Pelvic exam was performed with patient in the lithotomy position.     Vulva normal.     No posterior fourchette injury, rash or lesion present.     Genitourinary Comments: External exam only. Patient did not permit any internal exam.    HENT:     Head: Normocephalic and atraumatic.  Eyes:     General: No scleral icterus.    Conjunctiva/sclera: Conjunctivae normal.  Cardiovascular:     Rate and Rhythm: Normal rate and regular rhythm.     Heart sounds: No murmur. No friction rub. No gallop.   Pulmonary:     Effort: Pulmonary effort is normal.     Breath sounds: Normal breath sounds.  Abdominal:     General: There is no distension.     Palpations: Abdomen is soft.     Tenderness: There is no abdominal tenderness. There is no guarding.  Musculoskeletal: Normal range of motion.        General: No swelling.  Neurological:     General: No focal deficit present.     Mental Status: She is alert.     Cranial Nerves: No cranial nerve deficit.  Skin:    General: Skin is warm and dry.     Coloration: Skin is not jaundiced.     Female chaperone present for  pelvic and breast  portions of the physical exam  Assessment: 57 y.o. No obstetric history on file. female here for  1. Postmenopausal bleeding      Plan: Problem List Items Addressed This Visit      Other   Postmenopausal bleeding - Primary   Relevant Orders   US PELVIS (TRANSABDOMINAL ONLY)     The patient has menopausal bleeding of an unknown etiology.  She would not permit an exam today to investigate vaginal, cervical, and uterine etiologies.  There are no apparent lesions externally.  Discussed with the patient's  mother who is her power of attorney potential next steps and assessment.  One option would be to start with a pelvic ultrasound.  However, this would very likely be limited to a transabdominal approach, which gives limited information, potentially.  It is unlikely that the patient would tolerate a transvaginal ultrasound.  Steps beyond this would include an exam under anesthesia with potential endometrial sampling.  However, since she has had a single episode and none since, it is worthwhile to attempt an ultrasound as that may prove beneficial in guiding any future potential work-up.  I did discuss with the patient's mother that the concerns for postmenopausal bleeding would be atrophic endometrium, endometrial polyps, endometrial cancer, cervical causes, and vaginal issues.  We will proceed with ultrasound next and follow-up based on the results of the study.  30 minutes spent in face to face discussion with > 50% spent in counseling,management, and coordination of care of her postmenopausal bleeding.   Prentice Docker, MD 04/22/2018 5:14 PM

## 2018-04-23 ENCOUNTER — Telehealth: Payer: Self-pay | Admitting: Obstetrics and Gynecology

## 2018-04-23 NOTE — Telephone Encounter (Signed)
I spoke to the patient's mother, Kyann, to schedule the ultrasound for tomorrow, Friday, 04/24/18 @ 4:00pm at [facility] and the u/s f/u for Monday, 04/27/18 @ 2:30pm at [facility] w/ Dr. Glennon Mac. Adalyna contacted Elouise Munroe @ [name] who confirmed they can bring the patient.

## 2018-04-24 ENCOUNTER — Other Ambulatory Visit: Payer: Self-pay

## 2018-04-24 ENCOUNTER — Ambulatory Visit (INDEPENDENT_AMBULATORY_CARE_PROVIDER_SITE_OTHER): Payer: Medicare Other

## 2018-04-24 DIAGNOSIS — N950 Postmenopausal bleeding: Secondary | ICD-10-CM

## 2018-04-24 DIAGNOSIS — K529 Noninfective gastroenteritis and colitis, unspecified: Secondary | ICD-10-CM | POA: Diagnosis not present

## 2018-04-27 ENCOUNTER — Ambulatory Visit (INDEPENDENT_AMBULATORY_CARE_PROVIDER_SITE_OTHER): Payer: Medicare Other | Admitting: Obstetrics and Gynecology

## 2018-04-27 ENCOUNTER — Other Ambulatory Visit: Payer: Self-pay

## 2018-04-27 ENCOUNTER — Encounter: Payer: Self-pay | Admitting: Obstetrics and Gynecology

## 2018-04-27 VITALS — BP 128/74

## 2018-04-27 DIAGNOSIS — N950 Postmenopausal bleeding: Secondary | ICD-10-CM | POA: Diagnosis not present

## 2018-04-27 NOTE — Progress Notes (Signed)
Gynecology Ultrasound Follow Up   Chief Complaint  Patient presents with   Follow-up  Postmenopausal bleeding  History of Present Illness: Patient is a 58 y.o. female who presents today for ultrasound evaluation of the above .  Ultrasound demonstrates the following findings Adnexa: no masses seen (not visualized) Uterus: not visualized  Additional: bladder and cervix appeared normal.   She has had no additional bleeding according to her mother who is her power of attorney.  Also her care giver is with her and states that as far she knows there has been no additional bleeding.  Past Medical History:  Diagnosis Date   Anxiety    Arthritis    osteoarthritis   Breast cancer ([place]) 2015   [name]   Cancer ([place]) 11/2013   T1c, Nx; ER/ PR +; Her 2 neg invasive mammary carcinoma left breast. Wide excision only based on Tumor Board review.    Cataract    Depression    Difficulty swallowing    Falls    GERD (gastroesophageal reflux disease)    Hidradenitis 2012   MRSA, on chronic topical suppression   History of hiatal hernia    Hyperlipidemia    Hypertension    Mental retardation    MRSA (methicillin resistant staph aureus) culture positive 2012   Osteoporosis    Pneumonia    aspiration pneumonia   Seizures ([place])    last seizure February 2017    Past Surgical History:  Procedure Laterality Date   BREAST LUMPECTOMY Left 12/07/2013   T1b, NX, ER/PR positive, HER-2/neu not overexpressed, 7 mm.  Not candidate for chemotherapy/sentinel node/radiation based on mental retardation.   BREAST SURGERY Left 12/07/2013   T1b, Nx; ER+, PR+, her 2 not amplified. 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 HIP  08/23/2015   Procedure: CONVERSION TO TOTAL HIP;  Surgeon: Dereck Leep, MD;  Location: [facility] ORS;  Service: Orthopedics;;   dislocated hip      HARDWARE REMOVAL  08/23/2015   Procedure: HARDWARE REMOVAL;  Surgeon: Dereck Leep, MD;  Location: [facility] ORS;  Service: Orthopedics;;   TOTAL HIP REVISION Right 08/23/2015   Procedure: TOTAL HIP REVISION;  Surgeon: Dereck Leep, MD;  Location: [facility] ORS;  Service: Orthopedics;  Laterality: Right;    Family History  Problem Relation Age of Onset   Diabetes Father    Parkinson's disease Father    Neuropathy Mother    Arthritis Mother    Diabetes Mother    Autoimmune disease Brother        AIDS   Cancer Maternal Aunt 57       breast   Breast cancer Maternal Aunt    Hypertension Maternal Grandmother    Cancer Paternal Grandmother     Social History   Socioeconomic History   Marital status: Single    Spouse name: Not on file   Number of children: Not on file   Years of education: Not on file   Highest education level: Not on file  Occupational History   Not on file  Social Needs   Financial resource strain: Not on file   Food insecurity:    Worry: Not on file    Inability: Not on file   Transportation needs:    Medical: Not on file    Non-medical: Not on file  Tobacco Use   Smoking status: Never Smoker  Smokeless tobacco: Never Used  Substance and Sexual Activity   Alcohol use: No   Drug use: No   Sexual activity: Never  Lifestyle   Physical activity:    Days per week: Not on file    Minutes per session: Not on file   Stress: Not on file  Relationships   Social connections:    Talks on phone: Not on file    Gets together: Not on file    Attends religious service: Not on file    Active member of club or organization: Not on file    Attends meetings of clubs or organizations: Not on file    Relationship status: Not on file   Intimate partner violence:    Fear of current or ex partner: Not on file    Emotionally abused: Not on file    Physically abused: Not on file    Forced sexual activity: Not on file  Other Topics Concern    Not on file  Social History Narrative   Not on file    Allergies  Allergen Reactions   Sulfa Antibiotics Other (See Comments)    "Does not know"   Keflex [Cephalexin] Other (See Comments)    "Does not know" unknown    Prior to Admission medications   Medication Sig Start Date End Date Taking? Authorizing Provider  acetaminophen (TYLENOL) 325 MG tablet Take 2 tablets (650 mg total) by mouth every 6 (six) hours as needed. 07/14/17   Jerrol Banana., MD  alendronate (FOSAMAX) 70 MG tablet TAKE 1 TABLET  BY MOUTH WEEKLY EARLY MORNING BEFORE FOOD/MEDS WITH WATER. DO NOT LIE DOWN FOR 30 MINUTES 08/13/17   Jerrol Banana., MD  amLODipine (NORVASC) 10 MG tablet TAKE 1 TABLET BY MOUTH AT BEDTIME 08/13/17   Jerrol Banana., MD  BENZOYL PEROXIDE 5 % external wash USE TOPICALLY ONCE DAILY 01/22/18   Jerrol Banana., MD  calcium-vitamin D (OSCAL WITH D) 500-200 MG-UNIT TABS tablet TAKE 1 TABLET BY MOUTH EVERY DAY WITH BREAKFAST 08/13/17   Jerrol Banana., MD  Cholecalciferol (VITAMIN D) 2000 units tablet TAKE 1 TABLET BY MOUTH EACH DAY 09/04/17   Jerrol Banana., MD  clindamycin (CLEOCIN T) 1 % external solution Apply topically daily. 07/14/17   Jerrol Banana., MD  clotrimazole (LOTRIMIN) 1 % cream APPLY TOPICALLY TWICE A DAY AS NEEDED FOR RASH IN SKIN FOLDS 02/05/17   Jerrol Banana., MD  coal tar (NEUTROGENA T-GEL) 0.5 % shampoo Use as directed 01/30/18   Jerrol Banana., MD  cyanocobalamin (,VITAMIN B-12,) 1000 MCG/ML injection GIVE 1000 MCG INJECTION "IM" ONCE A MONTH (B-12 SUPPLEMENT) 08/19/17   Jerrol Banana., MD  diphenhydrAMINE (BENADRYL) 12.5 MG chewable tablet Chew 1 tablet (12.5 mg total) by mouth 3 (three) times daily. 04/02/18   Jerrol Banana., MD  docusate sodium (COLACE) 100 MG capsule TAKE 1 CAPSULE BY MOUTH 2 TIMES PER DAY 08/13/17   Jerrol Banana., MD  DULoxetine (CYMBALTA) 30 MG capsule Take 1 capsule (30 mg  total) by mouth daily. 04/10/18   Jerrol Banana., MD  DULoxetine (CYMBALTA) 60 MG capsule Take 1 capsule (60 mg total) by mouth daily. 04/10/18   Jerrol Banana., MD  ipratropium (ATROVENT) 0.03 % nasal spray Place 2 sprays into both nostrils 3 (three) times daily. 02/24/18   Virginia Crews, MD  lamoTRIgine (LAMICTAL) 150 MG tablet TAKE  2 TABLETS BY MOUTH 2 TIMES PER DAY 03/08/18   Jerrol Banana., MD  letrozole [facility]) 2.5 MG tablet TAKE 1 TABLET BY MOUTH EVERY DAY 08/13/17   Jerrol Banana., MD  LORazepam (ATIVAN) 1 MG tablet TAKE 1 TAB BY MOUTH EVERY MORNING, 1/2 TAB AT NOON AND AT BEDTIME. TAKE 1 TAB FOR SEIZURES >3 MIN OR MORE THAN 2 IN 2H 12/10/17   Jerrol Banana., MD  losartan (COZAAR) 100 MG tablet TAKE 1 TABLET BY MOUTH EVERY DAY 08/13/17   Jerrol Banana., MD  pantoprazole (PROTONIX) 40 MG tablet TAKE 1 TABLET BY MOUTH 2 TIMES PER DAY **DO NOT CRUSH** 09/08/17   Jerrol Banana., MD  terbinafine (LAMISIL) 1 % cream Use 2 times a week as needed 07/14/17   Jerrol Banana., MD  triamcinolone (KENALOG) 0.025 % ointment Apply 1 application topically 2 (two) times daily as needed.    [provider]    Physical Exam BP 128/74    General: NAD HEENT: normocephalic, anicteric Pulmonary: No increased work of breathing Extremities: no edema, erythema, or tenderness Neurologic: Grossly intact, normal gait Psychiatric: mood appropriate, affect full  Imaging Results US Pelvis (transabdominal Only)  Result Date: 04/27/2018 Patient Name: Helen Patton DOB: Nov 01, 1960 MRN: [id number] ULTRASOUND REPORT Location: [facility] Date of Service: 04/24/2018 Indications: Postmenopausal bleeding Findings: Limited exam due to transabdominal approach and patient unable to fill bladder Bladder and cervix appear within normal limits. Uterus and ovaries are not visualized. Impression: 1. Non-diagnostic study. Recommendations: 1.Clinical  correlation with the patient's History and Physical Exam. 2. Follow up as clinically indicated Vita Barley, RDMS RVT The ultrasound images and findings were reviewed by me and I agree with the above report. Prentice Docker, MD, [facility], [facility] 04/27/2018 2:58 PM      Assessment: 58 y.o. No obstetric history on file.  1. Postmenopausal bleeding      Plan: Problem List Items Addressed This Visit      Other   Postmenopausal bleeding - Primary     Discussed that at this point I have been unable to do any meaningful evaluation.  The patient has not tolerated a pelvic exam.  Her transabdominal ultrasound was nondiagnostic (she would not allow a transvaginal pelvic ultrasound).  Discussed that my primary recommendation would be for the patient to undergo a hysteroscopy, dilation and curettage.  Alternatively, since she has had no bleeding since her initial incident, if the patient's power of attorney is hesitant to take her to the operating room, we could feasibly wait to see if she has any further episodes.  We are reliant on the staff at her home to notify the proper people that she has had bleeding.  However, they seem to have a good track record of doing that in the past.  If any further episodes occur, my very strong recommendation is to investigate with hysteroscopy, dilation and curettage.  Her mother, who is her power of attorney, understands that my recommendation at this moment is to have her undergo a procedure to further investigate her bleeding.  She voiced understanding of this and decided to wait to see if more bleeding occurs.  20 minutes spent in face to face discussion with > 50% spent in counseling,management, and coordination of care of her postmenopausal bleeding.  Prentice Docker, MD, [facility], [facility] 04/27/2018 4:38 PM

## 2018-04-29 ENCOUNTER — Telehealth: Payer: Self-pay

## 2018-04-29 LAB — GI PROFILE, STOOL, PCR

## 2018-04-29 NOTE — Telephone Encounter (Signed)
No answer; unable to LM.

## 2018-04-29 NOTE — Telephone Encounter (Signed)
-----   Message from Trinna Post, [place] sent at 04/29/2018  8:15 AM EDT ----- Stool panel is negative.

## 2018-04-30 NOTE — Telephone Encounter (Signed)
Patient advised as directed below. 

## 2018-06-08 ENCOUNTER — Other Ambulatory Visit: Payer: Self-pay | Admitting: Family Medicine

## 2018-06-29 ENCOUNTER — Telehealth: Payer: Self-pay

## 2018-06-29 NOTE — Telephone Encounter (Signed)
Someone would need to see the teeth to know what treatment might be appropriate. May be as simple as using OTC Anbesol drops on a bad tooth.

## 2018-06-29 NOTE — Telephone Encounter (Signed)
This is a Dr.Gilbert patient Patient's caregiver Helen Patton is calling that the patient's keep putting her fingers and hand in her mouth and that she feels that patient may be having some tooth issues and is wanting to know if something ca be send in for her to the pharmacy. Reports that she tried the [facility] and they are not open.  CB# [phone] please review and advise. She also reports that if a prescription is send in that if the prescription can be fax to [phone].

## 2018-06-30 NOTE — Telephone Encounter (Signed)
Advised 

## 2018-07-14 ENCOUNTER — Other Ambulatory Visit: Payer: Self-pay

## 2018-07-14 ENCOUNTER — Encounter: Payer: Self-pay | Admitting: Family Medicine

## 2018-07-14 ENCOUNTER — Ambulatory Visit (INDEPENDENT_AMBULATORY_CARE_PROVIDER_SITE_OTHER): Payer: Medicare Other | Admitting: Family Medicine

## 2018-07-14 VITALS — BP 110/62 | HR 85 | Temp 98.1°F | Resp 16 | Wt 121.6 lb

## 2018-07-14 DIAGNOSIS — G808 Other cerebral palsy: Secondary | ICD-10-CM

## 2018-07-14 DIAGNOSIS — Q02 Microcephaly: Secondary | ICD-10-CM

## 2018-07-14 DIAGNOSIS — J3489 Other specified disorders of nose and nasal sinuses: Secondary | ICD-10-CM

## 2018-07-14 DIAGNOSIS — F79 Unspecified intellectual disabilities: Secondary | ICD-10-CM

## 2018-07-14 DIAGNOSIS — J309 Allergic rhinitis, unspecified: Secondary | ICD-10-CM | POA: Diagnosis not present

## 2018-07-14 NOTE — Progress Notes (Signed)
Patient: Helen Patton Female    DOB: [birth year]   58 y.o.   MRN: [id number] Visit Date: 07/14/2018  Today's Provider: Wilhemena Durie, MD   Chief Complaint  Patient presents with  . Nasal Congestion   Subjective:      HPI Patient is here for FL2 forms to be filled out. She is also still having nasal drainage.Simple rhinorrhea without other symptoms. Allergies  Allergen Reactions  . Sulfa Antibiotics Other (See Comments)    "Does not know"  . Keflex [Cephalexin] Other (See Comments)    "Does not know" unknown     Current Outpatient Medications:  .  acetaminophen (TYLENOL) 325 MG tablet, Take 2 tablets (650 mg total) by mouth every 6 (six) hours as needed., Disp: 100 tablet, Rfl: 11 .  alendronate (FOSAMAX) 70 MG tablet, TAKE 1 TABLET  BY MOUTH WEEKLY EARLY MORNING BEFORE FOOD/MEDS WITH WATER. DO NOT LIE DOWN FOR 30 MINUTES, Disp: 4 tablet, Rfl: 12 .  amLODipine (NORVASC) 10 MG tablet, TAKE 1 TABLET BY MOUTH AT BEDTIME, Disp: 30 tablet, Rfl: 12 .  BENZOYL PEROXIDE 5 % external wash, USE TOPICALLY ONCE DAILY, Disp: 150 g, Rfl: 3 .  calcium-vitamin D (OSCAL WITH D) 500-200 MG-UNIT TABS tablet, TAKE 1 TABLET BY MOUTH EVERY DAY WITH BREAKFAST, Disp: 30 each, Rfl: 11 .  Cholecalciferol (VITAMIN D) 2000 units tablet, TAKE 1 TABLET BY MOUTH EACH DAY, Disp: 30 tablet, Rfl: 12 .  clindamycin (CLEOCIN T) 1 % external solution, Apply topically daily., Disp: 60 mL, Rfl: 12 .  clotrimazole (LOTRIMIN) 1 % cream, APPLY TOPICALLY TWICE A DAY AS NEEDED FOR RASH IN SKIN FOLDS, Disp: 30 g, Rfl: 0 .  coal tar (NEUTROGENA T-GEL) 0.5 % shampoo, Use as directed, Disp: 30 mL, Rfl: 11 .  cyanocobalamin (,VITAMIN B-12,) 1000 MCG/ML injection, GIVE 1000 MCG INJECTION "IM" ONCE A MONTH (B-12 SUPPLEMENT), Disp: 1 mL, Rfl: 11 .  diphenhydrAMINE (BENADRYL) 12.5 MG chewable tablet, Chew 1 tablet (12.5 mg total) by mouth 3 (three) times daily., Disp: 90 tablet, Rfl: 11 .  docusate sodium (COLACE)  100 MG capsule, TAKE 1 CAPSULE BY MOUTH 2 TIMES PER DAY, Disp: 60 capsule, Rfl: 12 .  DULoxetine (CYMBALTA) 30 MG capsule, Take 1 capsule (30 mg total) by mouth daily., Disp: 30 capsule, Rfl: 11 .  DULoxetine (CYMBALTA) 60 MG capsule, Take 1 capsule (60 mg total) by mouth daily., Disp: 30 capsule, Rfl: 11 .  ipratropium (ATROVENT) 0.03 % nasal spray, Place 2 sprays into both nostrils 3 (three) times daily., Disp: 30 mL, Rfl: 12 .  lamoTRIgine (LAMICTAL) 150 MG tablet, TAKE 2 TABLETS BY MOUTH 2 TIMES PER DAY, Disp: 120 tablet, Rfl: 5 .  letrozole (FEMARA) 2.5 MG tablet, TAKE 1 TABLET BY MOUTH EVERY DAY, Disp: 30 tablet, Rfl: 12 .  LORazepam (ATIVAN) 1 MG tablet, TAKE 1 TAB BY MOUTH EVERY MORNING, 1/2 TAB AT NOON AND AT BEDTIME. TAKE 1 TAB FOR SEIZURES >3 MIN OR MORE THAN 2 IN 2H, Disp: 90 tablet, Rfl: 3 .  losartan (COZAAR) 100 MG tablet, TAKE 1 TABLET BY MOUTH EVERY DAY, Disp: 30 tablet, Rfl: 11 .  pantoprazole (PROTONIX) 40 MG tablet, TAKE 1 TABLET BY MOUTH 2 TIMES PER DAY **DO NOT CRUSH**, Disp: 60 tablet, Rfl: 11 .  terbinafine (LAMISIL) 1 % cream, Use 2 times a week as needed, Disp: 30 g, Rfl: 2 .  triamcinolone (KENALOG) 0.025 % ointment, Apply 1 application topically 2 (  two) times daily as needed., Disp: , Rfl:   Review of Systems  Constitutional: Negative.   HENT: Negative.   Eyes: Negative.   Respiratory: Negative.   Cardiovascular: Negative.   Gastrointestinal: Negative.   Endocrine: Negative.   Genitourinary: Negative.   Musculoskeletal: Positive for arthralgias (right leg).  Skin: Negative.   Allergic/Immunologic: Negative.   Neurological: Negative.   Hematological: Negative.   Psychiatric/Behavioral: Negative.   All other systems reviewed and are negative.   Social History   Tobacco Use  . Smoking status: Never Smoker  . Smokeless tobacco: Never Used  Substance Use Topics  . Alcohol use: No      Objective:   BP 110/62 (BP Location: Right Arm, Patient Position:  Sitting, Cuff Size: Normal)   Pulse 85   Temp 98.1 F (36.7 C) (Oral)   Resp 16   Wt 121 lb 9.6 oz (55.2 kg)   SpO2 98%   BMI 24.56 kg/m  Vitals:   07/14/18 1044  BP: 110/62  Pulse: 85  Resp: 16  Temp: 98.1 F (36.7 C)  TempSrc: Oral  SpO2: 98%  Weight: 121 lb 9.6 oz (55.2 kg)     Physical Exam Vitals signs reviewed.  Constitutional:      Appearance: Normal appearance. She is well-developed.  HENT:     Head: Atraumatic.     Right Ear: External ear normal.     Left Ear: External ear normal.     Nose: Nose normal.  Eyes:     General: No scleral icterus. Neck:     Thyroid: No thyromegaly.  Cardiovascular:     Rate and Rhythm: Normal rate and regular rhythm.     Heart sounds: Normal heart sounds.  Pulmonary:     Effort: Pulmonary effort is normal.     Breath sounds: Normal breath sounds.  Musculoskeletal:        General: No swelling.     Comments: No tenderness of the knees.  Figure-of-four maneuver normal today.  Skin:    General: Skin is warm and dry.  Neurological:     Mental Status: Mental status is at baseline.     Comments: She has what appears to be a resting tremor of both hands.  Psychiatric:        Mood and Affect: Mood normal.         Assessment & Plan    1. Rhinorrhea Try Zyrtec daily  2. Allergic rhinitis, unspecified seasonality, unspecified trigger   3. Other cerebral palsy ([place]) FL 2 filled out for facility.  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 Intellectual disability All other issues stable.     Richard Cranford Mon, MD  [facility]

## 2018-08-06 ENCOUNTER — Ambulatory Visit: Payer: Self-pay | Admitting: Family Medicine

## 2018-08-10 ENCOUNTER — Ambulatory Visit: Payer: Medicare Other | Admitting: Podiatry

## 2018-09-03 ENCOUNTER — Other Ambulatory Visit: Payer: Self-pay | Admitting: Family Medicine

## 2018-09-03 DIAGNOSIS — I10 Essential (primary) hypertension: Secondary | ICD-10-CM

## 2018-09-08 ENCOUNTER — Encounter: Payer: Self-pay | Admitting: General Surgery

## 2018-09-15 DIAGNOSIS — F339 Major depressive disorder, recurrent, unspecified: Secondary | ICD-10-CM | POA: Diagnosis not present

## 2018-09-16 ENCOUNTER — Telehealth: Payer: Self-pay | Admitting: Family Medicine

## 2018-09-16 MED ORDER — CYANOCOBALAMIN 1000 MCG/ML IJ SOLN: INTRAMUSCULAR | 11 refills | Status: DC

## 2018-09-16 NOTE — Telephone Encounter (Signed)
Prescription has been filled.KW

## 2018-09-16 NOTE — Telephone Encounter (Signed)
Gena Fray, RN w/ [facility] - [phone]  cyanocobalamin (,VITAMIN B-12,) 1000 MCG/ML injection was due Yesterday.  Needing this filled to give to pt.  It is now due.  Please advise.  Thanks, [employer]

## 2018-09-16 NOTE — Telephone Encounter (Signed)
Yes thanks 

## 2018-09-16 NOTE — Telephone Encounter (Signed)
Ok to fill 

## 2018-10-08 ENCOUNTER — Telehealth: Payer: Self-pay | Admitting: Family Medicine

## 2018-10-08 MED ORDER — PANTOPRAZOLE SODIUM 40 MG PO TBEC: 40.0000 mg | DELAYED_RELEASE_TABLET | Freq: Two times a day (BID) | ORAL | 11 refills | Status: DC

## 2018-10-08 MED ORDER — VITAMIN D 50 MCG (2000 UT) PO TABS: ORAL_TABLET | ORAL | 12 refills | Status: DC

## 2018-10-08 MED ORDER — OYSTER SHELL CALCIUM/D 500-200 MG-UNIT PO TABS: 1.0000 | ORAL_TABLET | Freq: Every day | ORAL | 11 refills | Status: DC

## 2018-10-08 NOTE — Telephone Encounter (Signed)
Medication was sent into the pharmacy.  

## 2018-10-08 NOTE — Telephone Encounter (Signed)
Pharmacy called wanting to get a refill on   Vit D 2000  Cal 500 Vit d  Pantoprozole 40  Bubble pac   [name]

## 2018-10-20 ENCOUNTER — Other Ambulatory Visit: Payer: Self-pay

## 2018-10-20 DIAGNOSIS — I10 Essential (primary) hypertension: Secondary | ICD-10-CM

## 2018-10-20 MED ORDER — LOSARTAN POTASSIUM 100 MG PO TABS: 100.0000 mg | ORAL_TABLET | Freq: Every day | ORAL | 11 refills | Status: DC

## 2018-10-20 NOTE — Progress Notes (Signed)
Sent in losartan 100mg  daily per Dr. Rosanna Randy. Insurance reports that they needed a new Rx.

## 2018-11-03 ENCOUNTER — Other Ambulatory Visit: Payer: Self-pay | Admitting: Family Medicine

## 2018-11-09 DIAGNOSIS — R569 Unspecified convulsions: Secondary | ICD-10-CM | POA: Diagnosis not present

## 2018-11-19 DIAGNOSIS — Z23 Encounter for immunization: Secondary | ICD-10-CM | POA: Diagnosis not present

## 2018-12-02 ENCOUNTER — Telehealth: Payer: Self-pay | Admitting: Family Medicine

## 2018-12-02 NOTE — Chronic Care Management (AMB) (Signed)
°  Chronic Care Management   Outreach Note  12/02/2018 Name: CLARIE HEDRICH MRN: [id number] DOB: 1960/05/17  Referred by: Jerrol Banana., MD Reason for referral : Chronic Care Management (Initial CCM outreach was unsuccessful. )   An unsuccessful telephone outreach was attempted today. The patient was referred to the case management team by for assistance with care management and care coordination.   Follow Up Plan: The care management team will reach out to the patient again over the next 7 days.   [place]  [email or web address]   ??[phone]

## 2018-12-03 ENCOUNTER — Other Ambulatory Visit: Payer: Self-pay | Admitting: Family Medicine

## 2018-12-03 MED ORDER — LORAZEPAM 1 MG PO TABS: ORAL_TABLET | ORAL | 3 refills | Status: AC

## 2018-12-03 NOTE — Telephone Encounter (Signed)
Please review. Thanks!  

## 2018-12-03 NOTE — Telephone Encounter (Signed)
[facility] faxed refill request for the following medications:  LORazepam (ATIVAN) 1 MG tablet   Please advise.

## 2018-12-08 NOTE — Progress Notes (Signed)
Patient: Helen Patton Female    DOB: 10-23-1960   58 y.o.   MRN: [id number] Visit Date: 12/09/2018  Today's Provider: Wilhemena Durie, MD   No chief complaint on file.  Subjective:     HPI   Patient is here concerning hair pulling. Hair pulling was noticed about 3 weeks ago by care givers, however it was observed this week that patient is also eating her hair. Patient has bald spot from pulling her hair.   Patient also needs TSH levels checked.   Allergies  Allergen Reactions  . Sulfa Antibiotics Other (See Comments)    "Does not know"  . Keflex [Cephalexin] Other (See Comments)    "Does not know" unknown     Current Outpatient Medications:  .  acetaminophen (TYLENOL) 325 MG tablet, Take 2 tablets (650 mg total) by mouth every 6 (six) hours as needed., Disp: 100 tablet, Rfl: 11 .  alendronate (FOSAMAX) 70 MG tablet, TAKE 1 TABLET  BY MOUTH WEEKLY EARLY MORNING BEFORE FOOD/MEDS WITH WATER. DO NOT LIE DOWN FOR 30 MINUTES, Disp: 4 tablet, Rfl: 12 .  amLODipine (NORVASC) 10 MG tablet, TAKE 1 TABLET BY MOUTH AT BEDTIME, Disp: 30 tablet, Rfl: 12 .  BENZOYL PEROXIDE 5 % external wash, USE TOPICALLY ONCE DAILY, Disp: 150 g, Rfl: 3 .  calcium-vitamin D (OSCAL WITH D) 500-200 MG-UNIT TABS tablet, TAKE 1 TABLET BY MOUTH EVERY DAY WITH BREAKFAST, Disp: 30 tablet, Rfl: 11 .  cetirizine (ZYRTEC) 10 MG tablet, Take 10 mg by mouth daily., Disp: , Rfl:  .  Cholecalciferol (VITAMIN D) 50 MCG (2000 UT) tablet, TAKE 1 CAPSULE BY MOUTH EVERY DAY, Disp: 30 tablet, Rfl: 12 .  clindamycin (CLEOCIN T) 1 % external solution, Apply topically daily., Disp: 60 mL, Rfl: 12 .  clindamycin (CLEOCIN T) 1 % lotion, APPLY TOPICALLY ONCE DAILY, Disp: 60 mL, Rfl: 5 .  clotrimazole (LOTRIMIN) 1 % cream, APPLY TOPICALLY TWICE A DAY AS NEEDED FOR RASH IN SKIN FOLDS, Disp: 30 g, Rfl: 0 .  coal tar (NEUTROGENA T-GEL) 0.5 % shampoo, Use as directed, Disp: 30 mL, Rfl: 11 .  cyanocobalamin (,VITAMIN  B-12,) 1000 MCG/ML injection, GIVE 1000 MCG INJECTION "IM" ONCE A MONTH (B-12 SUPPLEMENT), Disp: 1 mL, Rfl: 11 .  diphenhydrAMINE (BENADRYL) 12.5 MG chewable tablet, Chew 1 tablet (12.5 mg total) by mouth 3 (three) times daily., Disp: 90 tablet, Rfl: 11 .  docusate sodium (COLACE) 100 MG capsule, TAKE 1 CAPSULE BY MOUTH 2 TIMES PER DAY, Disp: 60 capsule, Rfl: 12 .  DULoxetine (CYMBALTA) 30 MG capsule, Take 1 capsule (30 mg total) by mouth daily., Disp: 30 capsule, Rfl: 11 .  DULoxetine (CYMBALTA) 60 MG capsule, Take 1 capsule (60 mg total) by mouth daily., Disp: 30 capsule, Rfl: 11 .  ipratropium (ATROVENT) 0.03 % nasal spray, Place 2 sprays into both nostrils 3 (three) times daily., Disp: 30 mL, Rfl: 12 .  lamoTRIgine (LAMICTAL) 150 MG tablet, TAKE 2 TABLETS BY MOUTH 2 TIMES PER DAY, Disp: 120 tablet, Rfl: 5 .  letrozole (FEMARA) 2.5 MG tablet, TAKE 1 TABLET BY MOUTH EVERY DAY, Disp: 30 tablet, Rfl: 12 .  LORazepam (ATIVAN) 1 MG tablet, Take 1 tablet by mouth every morning, and 1/2 tablet at noon and bedtime., Disp: 90 tablet, Rfl: 3 .  losartan (COZAAR) 100 MG tablet, Take 1 tablet (100 mg total) by mouth daily., Disp: 30 tablet, Rfl: 11 .  pantoprazole (PROTONIX) 40 MG tablet, TAKE  1 TABLET BY MOUTH 2 TIMES PER DAY **DO NOT CRUSH**, Disp: 60 tablet, Rfl: 11 .  terbinafine (LAMISIL) 1 % cream, Use 2 times a week as needed, Disp: 30 g, Rfl: 2 .  triamcinolone (KENALOG) 0.025 % ointment, Apply 1 application topically 2 (two) times daily as needed., Disp: , Rfl:   Review of Systems  Constitutional: Negative for appetite change, chills, fatigue and fever.  Respiratory: Negative for chest tightness and shortness of breath.   Cardiovascular: Negative for chest pain and palpitations.  Gastrointestinal: Negative for abdominal pain, nausea and vomiting.  Endocrine: Negative.   Skin:       The entire top of her head has very short hair   Allergic/Immunologic: Negative.   Neurological: Negative for  dizziness and weakness.  Psychiatric/Behavioral:       Stable.    Social History   Tobacco Use  . Smoking status: Never Smoker  . Smokeless tobacco: Never Used  Substance Use Topics  . Alcohol use: No      Objective:   BP 103/63 (BP Location: Left Arm, Patient Position: Sitting, Cuff Size: Normal)   Pulse 91   Temp (!) 97.1 F (36.2 C) (Other (Comment))   Resp 18   Ht 4\' 11"  (1.499 m)   Wt 124 lb (56.2 kg)   SpO2 94%   BMI 25.04 kg/m  Vitals:   12/09/18 1135  BP: 103/63  Pulse: 91  Resp: 18  Temp: (!) 97.1 F (36.2 C)  TempSrc: Other (Comment)  SpO2: 94%  Weight: 124 lb (56.2 kg)  Height: 4\' 11"  (1.499 m)  Body mass index is 25.04 kg/m.   Physical Exam Vitals signs reviewed.  Constitutional:      Appearance: Normal appearance.  HENT:     Head:     Comments: Patient with congenital microcephaly    Right Ear: External ear normal.     Left Ear: External ear normal.  Cardiovascular:     Rate and Rhythm: Normal rate and regular rhythm.     Heart sounds: Normal heart sounds.  Pulmonary:     Breath sounds: Normal breath sounds.  Skin:    General: Skin is warm and dry.     Comments: Entire top of her head with very short hair from where she is pulled her hair out  Neurological:     Mental Status: Mental status is at baseline.  Psychiatric:        Mood and Affect: Mood normal.      No results found for any visits on 12/09/18.     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  2. Hypertension, unspecified type Controlled, may need to back off on some medication as she ages. More than 50% 25 minute visit spent in counseling or coordination of care  - CBC w/Diff/Platelet - Comprehensive Metabolic Panel (CMET) - TSH  3. Allergic rhinitis, unspecified seasonality, unspecified trigger .  Blood pressure today 103/63 - CBC w/Diff/Platelet - Comprehensive  Metabolic Panel (CMET) - TSH  4. Hyperlipidemia, unspecified hyperlipidemia type We will probably not treat in the future.  Will follow clinically - CBC w/Diff/Platelet - Comprehensive Metabolic Panel (CMET) - TSH     Richard Cranford Mon, MD  [facility]

## 2018-12-09 ENCOUNTER — Ambulatory Visit (INDEPENDENT_AMBULATORY_CARE_PROVIDER_SITE_OTHER): Payer: Medicare Other | Admitting: Family Medicine

## 2018-12-09 ENCOUNTER — Encounter: Payer: Self-pay | Admitting: Family Medicine

## 2018-12-09 ENCOUNTER — Telehealth: Payer: Self-pay | Admitting: Family Medicine

## 2018-12-09 ENCOUNTER — Other Ambulatory Visit: Payer: Self-pay

## 2018-12-09 VITALS — BP 103/63 | HR 91 | Temp 97.1°F | Resp 18 | Ht 59.0 in | Wt 124.0 lb

## 2018-12-09 DIAGNOSIS — J309 Allergic rhinitis, unspecified: Secondary | ICD-10-CM

## 2018-12-09 DIAGNOSIS — F633 Trichotillomania: Secondary | ICD-10-CM | POA: Diagnosis not present

## 2018-12-09 DIAGNOSIS — E785 Hyperlipidemia, unspecified: Secondary | ICD-10-CM

## 2018-12-09 DIAGNOSIS — I10 Essential (primary) hypertension: Secondary | ICD-10-CM

## 2018-12-09 NOTE — Telephone Encounter (Signed)
Please review. Thanks!  

## 2018-12-09 NOTE — Telephone Encounter (Signed)
Group home calling about needing orders for pt's pull ups she wear.  Thanks, [employer]

## 2018-12-09 NOTE — Chronic Care Management (AMB) (Signed)
Chronic Care Management   Note  12/09/2018 Name: AUDRINNA SHERMAN MRN: [id number] DOB: 03/08/1960  Helen Patton is a 58 y.o. year old female who is a primary care patient of Jerrol Banana., MD. I reached out to Jinny Sanders by phone today in response to a referral sent by Ms. Helen [place] Kennard's health plan.     Ms. Blake was given information about Chronic Care Management services today including:  1. CCM service includes personalized support from designated clinical staff supervised by her physician, including individualized plan of care and coordination with other care providers 2. 24/7 contact phone numbers for assistance for urgent and routine care needs. 3. Service will only be billed when office clinical staff spend 20 minutes or more in a month to coordinate care. 4. Only one practitioner may furnish and bill the service in a calendar month. 5. The patient may stop CCM services at any time (effective at the end of the month) by phone call to the office staff. 6. The patient will be responsible for cost sharing (co-pay) of up to 20% of the service fee (after annual deductible is met).  Patients caregiver Elouise Munroe did not agree to enrollment in care management services due to patient being in a group home  and does not wish to consider at this time.  Follow up plan: The patients caregiver has been provided with contact information for the chronic care management team and has been advised to call with any health related questions or concerns.   [place]  [email or web address]   ??[phone]

## 2018-12-10 LAB — CBC WITH DIFFERENTIAL/PLATELET
Basophils Absolute: 0.1 10*3/uL (ref 0.0–0.2)
Basos: 1 %
EOS (ABSOLUTE): 0.1 10*3/uL (ref 0.0–0.4)
Eos: 1 %
Hematocrit: 33.1 % — ABNORMAL LOW (ref 34.0–46.6)
Hemoglobin: 10.8 g/dL — ABNORMAL LOW (ref 11.1–15.9)
Immature Grans (Abs): 0 10*3/uL (ref 0.0–0.1)
Immature Granulocytes: 0 %
Lymphocytes Absolute: 1.6 10*3/uL (ref 0.7–3.1)
Lymphs: 11 %
MCH: 28.5 pg (ref 26.6–33.0)
MCHC: 32.6 g/dL (ref 31.5–35.7)
MCV: 87 fL (ref 79–97)
Monocytes Absolute: 1.5 10*3/uL — ABNORMAL HIGH (ref 0.1–0.9)
Monocytes: 10 %
Neutrophils Absolute: 11.2 10*3/uL — ABNORMAL HIGH (ref 1.4–7.0)
Neutrophils: 77 %
Platelets: 395 10*3/uL (ref 150–450)
RBC: 3.79 x10E6/uL (ref 3.77–5.28)
RDW: 12.5 % (ref 11.7–15.4)
WBC: 14.5 10*3/uL — ABNORMAL HIGH (ref 3.4–10.8)

## 2018-12-10 LAB — COMPREHENSIVE METABOLIC PANEL
ALT: 11 IU/L (ref 0–32)
AST: 18 IU/L (ref 0–40)
Albumin/Globulin Ratio: 2.4 — ABNORMAL HIGH (ref 1.2–2.2)
Albumin: 4.7 g/dL (ref 3.8–4.9)
Alkaline Phosphatase: 89 IU/L (ref 39–117)
BUN/Creatinine Ratio: 21 (ref 12–28)
BUN: 16 mg/dL (ref 8–27)
Bilirubin Total: 0.3 mg/dL (ref 0.0–1.2)
CO2: 28 mmol/L (ref 20–29)
Calcium: 10.5 mg/dL — ABNORMAL HIGH (ref 8.7–10.3)
Chloride: 96 mmol/L (ref 96–106)
Creatinine, Ser: 0.77 mg/dL (ref 0.57–1.00)
GFR calc Af Amer: 97 mL/min/{1.73_m2} (ref >59)
GFR calc non Af Amer: 84 mL/min/{1.73_m2} (ref >59)
Globulin, Total: 2 g/dL (ref 1.5–4.5)
Glucose: 103 mg/dL — ABNORMAL HIGH (ref 65–99)
Potassium: 3.9 mmol/L (ref 3.5–5.2)
Sodium: 138 mmol/L (ref 134–144)
Total Protein: 6.7 g/dL (ref 6.0–8.5)

## 2018-12-10 LAB — TSH: TSH: 1.96 u[IU]/mL (ref 0.450–4.500)

## 2018-12-10 NOTE — Telephone Encounter (Signed)
I signed them earlier this week.

## 2018-12-14 ENCOUNTER — Telehealth: Payer: Self-pay

## 2018-12-14 NOTE — Telephone Encounter (Signed)
-----   Message from Jerrol Banana., MD sent at 12/11/2018 11:29 AM EDT ----- WBC mildly elevated--repeat CBC next visit.

## 2018-12-14 NOTE — Telephone Encounter (Signed)
No answer, mailbox is full.

## 2018-12-15 ENCOUNTER — Telehealth: Payer: Self-pay

## 2018-12-15 DIAGNOSIS — F339 Major depressive disorder, recurrent, unspecified: Secondary | ICD-10-CM | POA: Diagnosis not present

## 2018-12-15 NOTE — Telephone Encounter (Signed)
[facility] calling for patient (patient there with her) asking on the status of the fax that have been send to the PCP several times to verify if the patient need pan for incontinence? Have you seen this fax verification that needs to be send to the insurance. CB# [phone]

## 2018-12-15 NOTE — Telephone Encounter (Signed)
Orders were signed and faxed.  ?

## 2018-12-15 NOTE — Telephone Encounter (Signed)
Tried calling Helen Patton to see if she could re-fax the order to my attention. We have not seen this fax. Unable to leave a VM due to mailbox being full.

## 2018-12-23 ENCOUNTER — Other Ambulatory Visit: Payer: Self-pay | Admitting: Family Medicine

## 2018-12-23 MED ORDER — PANTOPRAZOLE SODIUM 40 MG PO TBEC: DELAYED_RELEASE_TABLET | ORAL | 11 refills | Status: AC

## 2018-12-23 MED ORDER — CETIRIZINE HCL 10 MG PO TABS: 10.0000 mg | ORAL_TABLET | Freq: Every day | ORAL | 3 refills | Status: AC

## 2018-12-23 NOTE — Telephone Encounter (Addendum)
[facility] faxed refill request for the following medications:  pantoprazole (PROTONIX) 40 MG tablet  cetirizine (ZYRTEC) 10 MG tablet   Please advise.

## 2018-12-23 NOTE — Telephone Encounter (Signed)
Please advise refill? We have never filled Zyrtec for pt before.

## 2019-01-13 ENCOUNTER — Telehealth: Payer: Self-pay | Admitting: Family Medicine

## 2019-01-13 NOTE — Telephone Encounter (Signed)
Helen Patton with [facility] called saying Helen Patton tested negative on Nov 25 but she is having headache , stomach ache, no fever.  They will be tested again today or tomorrow.  There are people who are positive in the home.  Please advise  CB#  [phone]

## 2019-01-13 NOTE — Telephone Encounter (Signed)
Please test Covid as planned.

## 2019-01-13 NOTE — Telephone Encounter (Signed)
From [facility] 

## 2019-01-14 NOTE — Telephone Encounter (Signed)
Helen Patton was advised. Helen Patton wanted to know if patient can try another medication for stomach ache and headache? Patient has been taking Benadryl for a runny nose but it is not helping. Please advise?

## 2019-02-10 ENCOUNTER — Other Ambulatory Visit: Payer: Self-pay

## 2019-02-10 DIAGNOSIS — Z1231 Encounter for screening mammogram for malignant neoplasm of breast: Secondary | ICD-10-CM

## 2019-02-25 ENCOUNTER — Encounter: Payer: Self-pay | Admitting: Emergency Medicine

## 2019-02-25 ENCOUNTER — Inpatient Hospital Stay
Admission: EM | Admit: 2019-02-25 | Discharge: 2019-03-01 | DRG: 871 | Disposition: A | Discharge status: Home / Self Care | Payer: Medicare Other | Attending: Internal Medicine | Admitting: Internal Medicine

## 2019-02-25 ENCOUNTER — Telehealth: Payer: Self-pay

## 2019-02-25 ENCOUNTER — Emergency Department: Payer: Medicare Other

## 2019-02-25 DIAGNOSIS — F329 Major depressive disorder, single episode, unspecified: Secondary | ICD-10-CM | POA: Diagnosis present

## 2019-02-25 DIAGNOSIS — Z833 Family history of diabetes mellitus: Secondary | ICD-10-CM

## 2019-02-25 DIAGNOSIS — Z853 Personal history of malignant neoplasm of breast: Secondary | ICD-10-CM | POA: Diagnosis not present

## 2019-02-25 DIAGNOSIS — E876 Hypokalemia: Secondary | ICD-10-CM | POA: Diagnosis present

## 2019-02-25 DIAGNOSIS — Z79811 Long term (current) use of aromatase inhibitors: Secondary | ICD-10-CM

## 2019-02-25 DIAGNOSIS — Z820 Family history of epilepsy and other diseases of the nervous system: Secondary | ICD-10-CM

## 2019-02-25 DIAGNOSIS — K5790 Diverticulosis of intestine, part unspecified, without perforation or abscess without bleeding: Secondary | ICD-10-CM | POA: Diagnosis present

## 2019-02-25 DIAGNOSIS — A4902 Methicillin resistant Staphylococcus aureus infection, unspecified site: Secondary | ICD-10-CM

## 2019-02-25 DIAGNOSIS — J189 Pneumonia, unspecified organism: Secondary | ICD-10-CM | POA: Diagnosis not present

## 2019-02-25 DIAGNOSIS — M810 Age-related osteoporosis without current pathological fracture: Secondary | ICD-10-CM | POA: Diagnosis present

## 2019-02-25 DIAGNOSIS — J9601 Acute respiratory failure with hypoxia: Secondary | ICD-10-CM

## 2019-02-25 DIAGNOSIS — G809 Cerebral palsy, unspecified: Secondary | ICD-10-CM | POA: Diagnosis present

## 2019-02-25 DIAGNOSIS — Z8249 Family history of ischemic heart disease and other diseases of the circulatory system: Secondary | ICD-10-CM

## 2019-02-25 DIAGNOSIS — Z96641 Presence of right artificial hip joint: Secondary | ICD-10-CM | POA: Diagnosis present

## 2019-02-25 DIAGNOSIS — E785 Hyperlipidemia, unspecified: Secondary | ICD-10-CM | POA: Diagnosis present

## 2019-02-25 DIAGNOSIS — R1110 Vomiting, unspecified: Secondary | ICD-10-CM | POA: Diagnosis not present

## 2019-02-25 DIAGNOSIS — Z170 Estrogen receptor positive status [ER+]: Secondary | ICD-10-CM

## 2019-02-25 DIAGNOSIS — Z803 Family history of malignant neoplasm of breast: Secondary | ICD-10-CM

## 2019-02-25 DIAGNOSIS — R110 Nausea: Secondary | ICD-10-CM | POA: Diagnosis not present

## 2019-02-25 DIAGNOSIS — M1990 Unspecified osteoarthritis, unspecified site: Secondary | ICD-10-CM | POA: Diagnosis present

## 2019-02-25 DIAGNOSIS — Z20822 Contact with and (suspected) exposure to covid-19: Secondary | ICD-10-CM | POA: Diagnosis not present

## 2019-02-25 DIAGNOSIS — Z882 Allergy status to sulfonamides status: Secondary | ICD-10-CM

## 2019-02-25 DIAGNOSIS — K567 Ileus, unspecified: Secondary | ICD-10-CM | POA: Diagnosis not present

## 2019-02-25 DIAGNOSIS — R0902 Hypoxemia: Secondary | ICD-10-CM | POA: Diagnosis not present

## 2019-02-25 DIAGNOSIS — R109 Unspecified abdominal pain: Secondary | ICD-10-CM | POA: Diagnosis not present

## 2019-02-25 DIAGNOSIS — A419 Sepsis, unspecified organism: Secondary | ICD-10-CM | POA: Diagnosis present

## 2019-02-25 DIAGNOSIS — Z881 Allergy status to other antibiotic agents status: Secondary | ICD-10-CM

## 2019-02-25 DIAGNOSIS — R569 Unspecified convulsions: Secondary | ICD-10-CM | POA: Diagnosis not present

## 2019-02-25 DIAGNOSIS — I10 Essential (primary) hypertension: Secondary | ICD-10-CM | POA: Diagnosis present

## 2019-02-25 DIAGNOSIS — R112 Nausea with vomiting, unspecified: Secondary | ICD-10-CM

## 2019-02-25 DIAGNOSIS — G40909 Epilepsy, unspecified, not intractable, without status epilepticus: Secondary | ICD-10-CM | POA: Diagnosis present

## 2019-02-25 DIAGNOSIS — F79 Unspecified intellectual disabilities: Secondary | ICD-10-CM | POA: Diagnosis present

## 2019-02-25 DIAGNOSIS — Z7983 Long term (current) use of bisphosphonates: Secondary | ICD-10-CM

## 2019-02-25 DIAGNOSIS — K219 Gastro-esophageal reflux disease without esophagitis: Secondary | ICD-10-CM | POA: Diagnosis present

## 2019-02-25 DIAGNOSIS — Z8261 Family history of arthritis: Secondary | ICD-10-CM

## 2019-02-25 LAB — PROTIME-INR
INR: 0.9 (ref 0.8–1.2)
Prothrombin Time: 12.2 seconds (ref 11.4–15.2)

## 2019-02-25 MED ORDER — SODIUM CHLORIDE 0.9 % IV BOLUS
1000.0000 mL | Freq: Once | INTRAVENOUS | Status: AC
  Administered 2019-02-26: 1000 mL via INTRAVENOUS

## 2019-02-25 MED ORDER — FENTANYL CITRATE (PF) 100 MCG/2ML IJ SOLN
50.0000 ug | Freq: Once | INTRAMUSCULAR | Status: AC
  Administered 2019-02-25: 50 ug via INTRAVENOUS
  Filled 2019-02-25: qty 2

## 2019-02-25 MED ORDER — PANTOPRAZOLE SODIUM 40 MG IV SOLR
40.0000 mg | Freq: Once | INTRAVENOUS | Status: AC
  Administered 2019-02-25: 40 mg via INTRAVENOUS
  Filled 2019-02-25: qty 40

## 2019-02-25 MED ORDER — ONDANSETRON HCL 4 MG/2ML IJ SOLN
4.0000 mg | Freq: Once | INTRAMUSCULAR | Status: AC
  Administered 2019-02-25: 4 mg via INTRAVENOUS
  Filled 2019-02-25: qty 2

## 2019-02-25 NOTE — ED Notes (Addendum)
Caregiver at bedside at this time.

## 2019-02-25 NOTE — ED Notes (Signed)
Pt vomited while at CT. Upon return, caregiver had left. Pt trying to get out of bed and requires sitter. Charge nurse notified.

## 2019-02-25 NOTE — ED Notes (Signed)
Care giver returns and is seated at bedside.

## 2019-02-25 NOTE — ED Triage Notes (Signed)
Pt arrived via EMS from [name] with lower abdominal pain and dark coffee emesis. Pt is non-verbal with caregiver at bedside. Pt guarding abdomen.

## 2019-02-25 NOTE — ED Notes (Signed)
This tech at bedside as 1:1 [employer] cabin crew.

## 2019-02-25 NOTE — ED Provider Notes (Addendum)
[facility] Emergency Department Provider Note  ____________________________________________   First MD Initiated Contact with Patient 02/25/19 2159     (approximate)  I have reviewed the triage vital signs and the nursing notes.   HISTORY  Chief Complaint Emesis and Abdominal Pain    HPI Helen Patton is a 59 y.o. female  Here with abdominal pain. history provided by caregiver. Per report, pt was in her usual state of health until this evening. She began to complain of abdominal pain and needing to vomit, then abruptly vomited brown-green emesis. She has since been apparently in pain with tenderness with any movement or palpation of the abdomen. She has vomited 2-3 x since arrival.  Level 5 caveat invoked as remainder of history, ROS, and physical exam limited due to patient's non-verbal status.     Past Medical History:  Diagnosis Date  . Anxiety   . Arthritis    osteoarthritis  . Breast cancer ([place]) 2015   [name]  . Cancer ([place]) 11/2013   T1c, Nx; ER/ PR +; Her 2 neg invasive mammary carcinoma left breast. Wide excision only based on Tumor Board review.   . Cataract   . Depression   . Difficulty swallowing   . Falls   . GERD (gastroesophageal reflux disease)   . Hidradenitis 2012   MRSA, on chronic topical suppression  . History of hiatal hernia   . Hyperlipidemia   . Hypertension   . Mental retardation   . MRSA (methicillin resistant staph aureus) culture positive 2012  . Osteoporosis   . Pneumonia    aspiration pneumonia  . Seizures [facility])    last seizure February 2017    Patient Active Problem List   Diagnosis Date Noted  . Postmenopausal bleeding 04/22/2018  . B12 deficiency 05/17/2016  . S/P total hip arthroplasty 08/23/2015  . Acute respiratory failure with hypoxia ([place]) 05/19/2015  . 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 Closed fracture of neck of right femur ([place]) 03/07/2015  . Anxiety  12/14/2014  . Arthralgia of hip 12/14/2014  . Athlete's foot 12/14/2014  . Cerebral palsy ([place]) 12/14/2014  . Cognitive decline 12/14/2014  . Clinical depression 12/14/2014  . Acid reflux 12/14/2014  . HLD (hyperlipidemia) 12/14/2014  . BP (high blood pressure) 12/14/2014  . Methicillin resistant Staphylococcus aureus infection 12/14/2014  . Adiposity 12/14/2014  . Breast cancer ([place]) 11/21/2013  . Intellectual disability 11/16/2013  . Seizure ([place]) 09/30/2013    Past Surgical History:  Procedure Laterality Date  . BREAST LUMPECTOMY Left 12/07/2013   T1b, NX, ER/PR positive, HER-2/neu not overexpressed, 7 mm.  Not candidate for chemotherapy/sentinel node/radiation based on mental retardation.  Marland Kitchen BREAST SURGERY Left 12/07/2013   T1b, Nx; ER+, PR+, her 2 not amplified. (Not candidate for chemotherapy/radiation based on mental retardation)  . broken leg    . CERVICAL ABLATION    . 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 dislocated hip    . HARDWARE REMOVAL  08/23/2015   Procedure: HARDWARE REMOVAL;  Surgeon: Dereck Leep, MD;  Location: [facility] ORS;  Service: Orthopedics;;  . TOTAL HIP REVISION Right 08/23/2015   Procedure: TOTAL HIP REVISION;  Surgeon: Dereck Leep, MD;  Location: [facility] ORS;  Service: Orthopedics;  Laterality: Right;    Prior to Admission medications   Medication Sig Start Date End Date  Taking? Authorizing Provider  acetaminophen (TYLENOL) 325 MG tablet Take 2 tablets (650 mg total) by mouth every 6 (six) hours as needed. 07/14/17  Yes Jerrol Banana., MD  alendronate (FOSAMAX) 70 MG tablet TAKE 1 TABLET  BY MOUTH WEEKLY EARLY MORNING BEFORE FOOD/MEDS WITH WATER. DO NOT LIE DOWN FOR 30 MINUTES 09/03/18  Yes Jerrol Banana., MD  amLODipine (NORVASC) 10 MG tablet TAKE 1 TABLET BY MOUTH AT BEDTIME 09/03/18  Yes Jerrol Banana., MD  BENZOYL  PEROXIDE 5 % external wash USE TOPICALLY ONCE DAILY 01/22/18  Yes Jerrol Banana., MD  calcium-vitamin D (OSCAL WITH D) 500-200 MG-UNIT TABS tablet TAKE 1 TABLET BY MOUTH EVERY DAY WITH BREAKFAST 11/03/18  Yes Jerrol Banana., MD  cetirizine (ZYRTEC) 10 MG tablet Take 1 tablet (10 mg total) by mouth daily. 12/23/18  Yes Jerrol Banana., MD  Cholecalciferol (VITAMIN D) 50 MCG [phone] UT) tablet TAKE 1 CAPSULE BY MOUTH EVERY DAY 11/03/18  Yes Jerrol Banana., MD  cyanocobalamin (,VITAMIN B-12,) 1000 MCG/ML injection GIVE 1000 MCG INJECTION "IM" ONCE A MONTH (B-12 SUPPLEMENT) 11/03/18  Yes Jerrol Banana., MD  diphenhydrAMINE (BENADRYL) 12.5 MG chewable tablet Chew 1 tablet (12.5 mg total) by mouth 3 (three) times daily. 04/02/18  Yes Jerrol Banana., MD  docusate sodium (COLACE) 100 MG capsule TAKE 1 CAPSULE BY MOUTH 2 TIMES PER DAY 09/03/18  Yes Jerrol Banana., MD  DULoxetine (CYMBALTA) 30 MG capsule Take 1 capsule (30 mg total) by mouth daily. 04/10/18  Yes Jerrol Banana., MD  DULoxetine (CYMBALTA) 60 MG capsule Take 1 capsule (60 mg total) by mouth daily. 04/10/18  Yes Jerrol Banana., MD  ipratropium (ATROVENT) 0.03 % nasal spray Place 2 sprays into both nostrils 3 (three) times daily. 02/24/18  Yes Bacigalupo, [name], MD  lamoTRIgine (LAMICTAL) 150 MG tablet TAKE 2 TABLETS BY MOUTH 2 TIMES PER DAY 09/03/18  Yes Jerrol Banana., MD  letrozole [facility]) 2.5 MG tablet TAKE 1 TABLET BY MOUTH EVERY DAY 09/03/18  Yes Jerrol Banana., MD  LORazepam (ATIVAN) 1 MG tablet Take 1 tablet by mouth every morning, and 1/2 tablet at noon and bedtime. 12/03/18  Yes Jerrol Banana., MD  losartan (COZAAR) 100 MG tablet Take 1 tablet (100 mg total) by mouth daily. 10/20/18  Yes Jerrol Banana., MD  pantoprazole (PROTONIX) 40 MG tablet TAKE 1 TABLET BY MOUTH 2 TIMES PER DAY **DO NOT CRUSH** 12/23/18  Yes Jerrol Banana., MD    terbinafine (LAMISIL) 1 % cream Use 2 times a week as needed 07/14/17  Yes Jerrol Banana., MD  triamcinolone (KENALOG) 0.025 % ointment Apply 1 application topically 2 (two) times daily as needed.   Yes [provider]  clindamycin (CLEOCIN T) 1 % external solution Apply topically daily. Patient not taking: Reported on 02/25/2019 07/14/17   Jerrol Banana., MD  clindamycin (CLEOCIN T) 1 % lotion APPLY TOPICALLY ONCE DAILY Patient not taking: Reported on 02/25/2019 11/03/18   Jerrol Banana., MD  clotrimazole (LOTRIMIN) 1 % cream APPLY TOPICALLY TWICE A DAY AS NEEDED FOR RASH IN SKIN FOLDS Patient not taking: Reported on 02/25/2019 02/05/17   Jerrol Banana., MD  coal tar (NEUTROGENA T-GEL) 0.5 % shampoo Use as directed Patient not taking: Reported on 02/25/2019 01/30/18   Jerrol Banana., MD  Allergies Sulfa antibiotics and Keflex [cephalexin]  Family History  Problem Relation Age of Onset  . Diabetes Father   . Parkinson's disease Father   . Neuropathy Mother   . Arthritis Mother   . Diabetes Mother   . Autoimmune disease Brother        AIDS  . Cancer Maternal Aunt 63       breast  . Breast cancer Maternal Aunt   . Hypertension Maternal Grandmother   . Cancer Paternal Grandmother     Social History Social History   Tobacco Use  . Smoking status: Never Smoker  . Smokeless tobacco: Never Used  Substance Use Topics  . Alcohol use: No  . Drug use: No    Review of Systems  Review of Systems  Unable to perform ROS: Mental status change     ____________________________________________  PHYSICAL EXAM:      VITAL SIGNS: ED Triage Vitals [02/25/19 2148]  Enc Vitals Group     BP (!) 154/72     Pulse Rate (!) 103     Resp 20     Temp 99.4 F (37.4 C)     Temp Source Oral     SpO2 90 %     Weight      Height      Head Circumference      Peak Flow      Pain Score      Pain Loc      Pain Edu?      Excl. in [place]?       Physical Exam Vitals and nursing note reviewed.  Constitutional:      General: She is in acute distress.     Appearance: She is well-developed.  HENT:     Head: Normocephalic and atraumatic.  Eyes:     Conjunctiva/sclera: Conjunctivae normal.  Cardiovascular:     Rate and Rhythm: Normal rate and regular rhythm.     Heart sounds: Normal heart sounds.  Pulmonary:     Effort: Pulmonary effort is normal. No respiratory distress.     Breath sounds: No wheezing.  Abdominal:     General: Abdomen is flat. Bowel sounds are increased. There is no distension.     Palpations: Abdomen is soft.     Tenderness: There is generalized abdominal tenderness. There is no guarding or rebound.  Musculoskeletal:     Cervical back: Neck supple.  Skin:    General: Skin is warm.     Capillary Refill: Capillary refill takes less than 2 seconds.     Findings: No rash.  Neurological:     Mental Status: Mental status is at baseline.     Motor: No abnormal muscle tone.     Comments: Non-verbal, MAE, at mental baseline per caregiver       ____________________________________________   LABS (all labs ordered are listed, but only abnormal results are displayed)  Labs Reviewed  PROTIME-INR  CBC WITH DIFFERENTIAL/PLATELET  COMPREHENSIVE METABOLIC PANEL  LIPASE, BLOOD  LACTIC ACID, PLASMA  LACTIC ACID, PLASMA    ____________________________________________  EKG: None ________________________________________  RADIOLOGY All imaging, including plain films, CT scans, and ultrasounds, independently reviewed by me, and interpretations confirmed via formal radiology reads.  ED MD interpretation:   AAS: Negative  Official radiology report(s): DG Abdomen Acute W/Chest  Result Date: 02/25/2019 CLINICAL DATA:  Vomiting, abdominal pain EXAM: DG ABDOMEN ACUTE W/ 1V CHEST COMPARISON:  10/07/2011 FINDINGS: Heart is normal size. Lungs clear. No effusions. Small hiatal hernia. There  is normal bowel gas  pattern. No free air. No organomegaly or suspicious calcification. No acute bony abnormality. IMPRESSION: No evidence of bowel obstruction or free air. No acute cardiopulmonary disease. Electronically Signed   By: Rolm Baptise M.D.   On: 02/25/2019 23:02    ____________________________________________  PROCEDURES   Procedure(s) performed (including Critical Care):  Ultrasound ED Peripheral IV (Provider)  Date/Time: 02/26/2019 12:01 AM Performed by: Duffy Bruce, MD Authorized by: Duffy Bruce, MD   Procedure details:    Indications: hydration and multiple failed IV attempts     Skin Prep: chlorhexidine gluconate     Location:  Left anterior forearm   Angiocath:  20 G   Bedside Ultrasound Guided: Yes     Images: archived     Patient tolerated procedure without complications: Yes     Dressing applied: Yes      ____________________________________________  INITIAL IMPRESSION / MDM / [place] / ED COURSE  As part of my medical decision making, I reviewed the following data within the [place] notes reviewed and incorporated, Old chart reviewed, Notes from prior ED visits, and [place] Controlled Substance Database       *Helen Patton was evaluated in Emergency Department on 02/26/2019 for the symptoms described in the history of present illness. She was evaluated in the context of the global COVID-19 pandemic, which necessitated consideration that the patient might be at risk for infection with the SARS-CoV-2 virus that causes COVID-19. Institutional protocols and algorithms that pertain to the evaluation of patients at risk for COVID-19 are in a state of rapid change based on information released by regulatory bodies including the CDC and federal and state organizations. These policies and algorithms were followed during the patient's care in the ED.  Some ED evaluations and interventions may be delayed as a result of limited staffing during  the pandemic.*     Medical Decision Making:  59 yo F here with abdominal pain, n/v. DDx includes SBO, PUD/gastritis, cholecystitis, transverse colitis, pancreatitis. Initial labs delayed 2/2 difficulty obtaining IV - US guided IV placed by myself. Will go to CT for further assessment. Fluids, antiemetics given. Protonix given empirically in event of possible PUD. Signed out to Dr. Owens Shark.  ____________________________________________  FINAL CLINICAL IMPRESSION(S) / ED DIAGNOSES  Final diagnoses:  Non-intractable vomiting with nausea, unspecified vomiting type     MEDICATIONS GIVEN DURING THIS VISIT:  Medications  fentaNYL (SUBLIMAZE) injection 50 mcg (has no administration in time range)  sodium chloride 0.9 % bolus 1,000 mL (has no administration in time range)  pantoprazole (PROTONIX) injection 40 mg (40 mg Intravenous Given 02/25/19 2353)  ondansetron (ZOFRAN) injection 4 mg (4 mg Intravenous Given 02/25/19 2355)     ED Discharge Orders    None       Note:  This document was prepared using Dragon voice recognition software and may include unintentional dictation errors.   Duffy Bruce, MD 02/26/19 0000    Duffy Bruce, MD 02/26/19 0001

## 2019-02-25 NOTE — Telephone Encounter (Signed)
A faxed refill request was received from her pharmacy and filed out and signed by provider for her lamotrigine 150mg  28qty with 11RF

## 2019-02-25 NOTE — ED Notes (Signed)
Contacted patients mother with an update. Will call again after imaging and lab results come back.

## 2019-02-25 NOTE — ED Notes (Signed)
See triage note. Pt non verbal and here with care giver. When asked about where she was feeling pain, patient indicated her stomach by patting her lower abdomen and then her head by tapping her forehead. Pt appears to be crying intermittently. Care giver states she began indicating she was having pain right after lunch today and vomited several times which looked like "coffee grounds".

## 2019-02-26 ENCOUNTER — Encounter: Payer: Self-pay | Admitting: Radiology

## 2019-02-26 ENCOUNTER — Other Ambulatory Visit: Payer: Self-pay

## 2019-02-26 ENCOUNTER — Emergency Department: Payer: Medicare Other

## 2019-02-26 DIAGNOSIS — Z96641 Presence of right artificial hip joint: Secondary | ICD-10-CM | POA: Diagnosis present

## 2019-02-26 DIAGNOSIS — J690 Pneumonitis due to inhalation of food and vomit: Secondary | ICD-10-CM | POA: Diagnosis not present

## 2019-02-26 DIAGNOSIS — K5790 Diverticulosis of intestine, part unspecified, without perforation or abscess without bleeding: Secondary | ICD-10-CM | POA: Diagnosis present

## 2019-02-26 DIAGNOSIS — Z853 Personal history of malignant neoplasm of breast: Secondary | ICD-10-CM

## 2019-02-26 DIAGNOSIS — Z8261 Family history of arthritis: Secondary | ICD-10-CM | POA: Diagnosis not present

## 2019-02-26 DIAGNOSIS — E785 Hyperlipidemia, unspecified: Secondary | ICD-10-CM | POA: Diagnosis present

## 2019-02-26 DIAGNOSIS — Z833 Family history of diabetes mellitus: Secondary | ICD-10-CM | POA: Diagnosis not present

## 2019-02-26 DIAGNOSIS — K219 Gastro-esophageal reflux disease without esophagitis: Secondary | ICD-10-CM | POA: Diagnosis present

## 2019-02-26 DIAGNOSIS — R0902 Hypoxemia: Secondary | ICD-10-CM | POA: Diagnosis present

## 2019-02-26 DIAGNOSIS — Z79811 Long term (current) use of aromatase inhibitors: Secondary | ICD-10-CM | POA: Diagnosis not present

## 2019-02-26 DIAGNOSIS — F329 Major depressive disorder, single episode, unspecified: Secondary | ICD-10-CM

## 2019-02-26 DIAGNOSIS — Z8249 Family history of ischemic heart disease and other diseases of the circulatory system: Secondary | ICD-10-CM | POA: Diagnosis not present

## 2019-02-26 DIAGNOSIS — I10 Essential (primary) hypertension: Secondary | ICD-10-CM

## 2019-02-26 DIAGNOSIS — Z820 Family history of epilepsy and other diseases of the nervous system: Secondary | ICD-10-CM | POA: Diagnosis not present

## 2019-02-26 DIAGNOSIS — F79 Unspecified intellectual disabilities: Secondary | ICD-10-CM | POA: Diagnosis not present

## 2019-02-26 DIAGNOSIS — K567 Ileus, unspecified: Secondary | ICD-10-CM | POA: Diagnosis present

## 2019-02-26 DIAGNOSIS — A419 Sepsis, unspecified organism: Secondary | ICD-10-CM | POA: Diagnosis not present

## 2019-02-26 DIAGNOSIS — R112 Nausea with vomiting, unspecified: Secondary | ICD-10-CM | POA: Diagnosis not present

## 2019-02-26 DIAGNOSIS — Z7983 Long term (current) use of bisphosphonates: Secondary | ICD-10-CM | POA: Diagnosis not present

## 2019-02-26 DIAGNOSIS — Z803 Family history of malignant neoplasm of breast: Secondary | ICD-10-CM | POA: Diagnosis not present

## 2019-02-26 DIAGNOSIS — R569 Unspecified convulsions: Secondary | ICD-10-CM | POA: Diagnosis not present

## 2019-02-26 DIAGNOSIS — J189 Pneumonia, unspecified organism: Secondary | ICD-10-CM | POA: Diagnosis present

## 2019-02-26 DIAGNOSIS — M1990 Unspecified osteoarthritis, unspecified site: Secondary | ICD-10-CM | POA: Diagnosis present

## 2019-02-26 DIAGNOSIS — G809 Cerebral palsy, unspecified: Secondary | ICD-10-CM | POA: Diagnosis present

## 2019-02-26 DIAGNOSIS — M810 Age-related osteoporosis without current pathological fracture: Secondary | ICD-10-CM | POA: Diagnosis present

## 2019-02-26 DIAGNOSIS — G40909 Epilepsy, unspecified, not intractable, without status epilepticus: Secondary | ICD-10-CM | POA: Diagnosis present

## 2019-02-26 DIAGNOSIS — Z20822 Contact with and (suspected) exposure to covid-19: Secondary | ICD-10-CM | POA: Diagnosis present

## 2019-02-26 DIAGNOSIS — R1110 Vomiting, unspecified: Secondary | ICD-10-CM | POA: Diagnosis not present

## 2019-02-26 DIAGNOSIS — Z170 Estrogen receptor positive status [ER+]: Secondary | ICD-10-CM | POA: Diagnosis not present

## 2019-02-26 LAB — URINALYSIS, ROUTINE W REFLEX MICROSCOPIC
Bacteria, UA: NONE SEEN
Bilirubin Urine: NEGATIVE
Glucose, UA: 50 mg/dL — AB
Ketones, ur: NEGATIVE mg/dL
Leukocytes,Ua: NEGATIVE
Nitrite: NEGATIVE
Protein, ur: NEGATIVE mg/dL
Specific Gravity, Urine: 1.031 — ABNORMAL HIGH (ref 1.005–1.030)
pH: 7 (ref 5.0–8.0)

## 2019-02-26 LAB — BASIC METABOLIC PANEL
Anion gap: 11 (ref 5–15)
BUN: 17 mg/dL (ref 6–20)
CO2: 28 mmol/L (ref 22–32)
Calcium: 8.8 mg/dL — ABNORMAL LOW (ref 8.9–10.3)
Chloride: 103 mmol/L (ref 98–111)
Creatinine, Ser: 0.54 mg/dL (ref 0.44–1.00)
GFR calc Af Amer: >60 mL/min (ref >60)
GFR calc non Af Amer: >60 mL/min (ref >60)
Glucose, Bld: 164 mg/dL — ABNORMAL HIGH (ref 70–99)
Potassium: 2.9 mmol/L — ABNORMAL LOW (ref 3.5–5.1)
Sodium: 142 mmol/L (ref 135–145)

## 2019-02-26 LAB — COMPREHENSIVE METABOLIC PANEL
ALT: 15 U/L (ref 0–44)
AST: 26 U/L (ref 15–41)
Albumin: 4.4 g/dL (ref 3.5–5.0)
Alkaline Phosphatase: 76 U/L (ref 38–126)
Anion gap: 13 (ref 5–15)
BUN: 16 mg/dL (ref 6–20)
CO2: 29 mmol/L (ref 22–32)
Calcium: 10 mg/dL (ref 8.9–10.3)
Chloride: 94 mmol/L — ABNORMAL LOW (ref 98–111)
Creatinine, Ser: 0.76 mg/dL (ref 0.44–1.00)
GFR calc Af Amer: >60 mL/min (ref >60)
GFR calc non Af Amer: >60 mL/min (ref >60)
Glucose, Bld: 241 mg/dL — ABNORMAL HIGH (ref 70–99)
Potassium: 3.1 mmol/L — ABNORMAL LOW (ref 3.5–5.1)
Sodium: 136 mmol/L (ref 135–145)
Total Bilirubin: 0.6 mg/dL (ref 0.3–1.2)
Total Protein: 7.9 g/dL (ref 6.5–8.1)

## 2019-02-26 LAB — CBC WITH DIFFERENTIAL/PLATELET
Abs Immature Granulocytes: 0.19 10*3/uL — ABNORMAL HIGH (ref 0.00–0.07)
Basophils Absolute: 0.1 10*3/uL (ref 0.0–0.1)
Basophils Relative: 0 %
Eosinophils Absolute: 0 10*3/uL (ref 0.0–0.5)
Eosinophils Relative: 0 %
HCT: 34.4 % — ABNORMAL LOW (ref 36.0–46.0)
Hemoglobin: 11.4 g/dL — ABNORMAL LOW (ref 12.0–15.0)
Immature Granulocytes: 1 %
Lymphocytes Relative: 3 %
Lymphs Abs: 0.7 10*3/uL (ref 0.7–4.0)
MCH: 28.9 pg (ref 26.0–34.0)
MCHC: 33.1 g/dL (ref 30.0–36.0)
MCV: 87.1 fL (ref 80.0–100.0)
Monocytes Absolute: 3.7 10*3/uL — ABNORMAL HIGH (ref 0.1–1.0)
Monocytes Relative: 15 %
Neutro Abs: 19.8 10*3/uL — ABNORMAL HIGH (ref 1.7–7.7)
Neutrophils Relative %: 81 %
Platelets: 357 10*3/uL (ref 150–400)
RBC: 3.95 MIL/uL (ref 3.87–5.11)
RDW: 14.9 % (ref 11.5–15.5)
WBC: 24.4 10*3/uL — ABNORMAL HIGH (ref 4.0–10.5)
nRBC: 0 % (ref 0.0–0.2)

## 2019-02-26 LAB — CBC
HCT: 32.1 % — ABNORMAL LOW (ref 36.0–46.0)
Hemoglobin: 10.3 g/dL — ABNORMAL LOW (ref 12.0–15.0)
MCH: 28.5 pg (ref 26.0–34.0)
MCHC: 32.1 g/dL (ref 30.0–36.0)
MCV: 88.7 fL (ref 80.0–100.0)
Platelets: 301 10*3/uL (ref 150–400)
RBC: 3.62 MIL/uL — ABNORMAL LOW (ref 3.87–5.11)
RDW: 14.9 % (ref 11.5–15.5)
WBC: 21.9 10*3/uL — ABNORMAL HIGH (ref 4.0–10.5)
nRBC: 0 % (ref 0.0–0.2)

## 2019-02-26 LAB — PATHOLOGIST SMEAR REVIEW

## 2019-02-26 LAB — LACTIC ACID, PLASMA
Lactic Acid, Venous: 2.6 mmol/L (ref 0.5–1.9)
Lactic Acid, Venous: 2.8 mmol/L (ref 0.5–1.9)
Lactic Acid, Venous: 2.3 mmol/L (ref 0.5–1.9)
Lactic Acid, Venous: 0.9 mmol/L (ref 0.5–1.9)

## 2019-02-26 LAB — RESPIRATORY PANEL BY RT PCR (FLU A&B, COVID)
Influenza A by PCR: NEGATIVE
Influenza B by PCR: NEGATIVE
SARS Coronavirus 2 by RT PCR: NEGATIVE

## 2019-02-26 LAB — POC SARS CORONAVIRUS 2 AG: SARS Coronavirus 2 Ag: NEGATIVE

## 2019-02-26 LAB — STREP PNEUMONIAE URINARY ANTIGEN: Strep Pneumo Urinary Antigen: NEGATIVE

## 2019-02-26 LAB — APTT: aPTT: 35 seconds (ref 24–36)

## 2019-02-26 LAB — PREGNANCY, URINE: Preg Test, Ur: NEGATIVE

## 2019-02-26 LAB — LIPASE, BLOOD: Lipase: 33 U/L (ref 11–51)

## 2019-02-26 LAB — HIV ANTIBODY (ROUTINE TESTING W REFLEX): HIV Screen 4th Generation wRfx: NONREACTIVE

## 2019-02-26 MED ORDER — ONDANSETRON HCL 4 MG PO TABS: 4.0000 mg | ORAL_TABLET | Freq: Four times a day (QID) | ORAL | Status: DC | PRN

## 2019-02-26 MED ORDER — PANTOPRAZOLE SODIUM 40 MG PO TBEC
40.0000 mg | DELAYED_RELEASE_TABLET | Freq: Every day | ORAL | Status: DC
  Administered 2019-02-27 – 2019-03-01 (×3): 40 mg via ORAL
  Filled 2019-02-26 (×3): qty 1

## 2019-02-26 MED ORDER — METRONIDAZOLE IN NACL 5-0.79 MG/ML-% IV SOLN
500.0000 mg | Freq: Three times a day (TID) | INTRAVENOUS | Status: DC
  Administered 2019-02-26 – 2019-03-01 (×9): 500 mg via INTRAVENOUS
  Filled 2019-02-26 (×10): qty 100

## 2019-02-26 MED ORDER — ACETAMINOPHEN 325 MG PO TABS
650.0000 mg | ORAL_TABLET | Freq: Four times a day (QID) | ORAL | Status: DC | PRN
  Administered 2019-02-27: 650 mg via ORAL
  Filled 2019-02-26: qty 2

## 2019-02-26 MED ORDER — ONDANSETRON HCL 4 MG/2ML IJ SOLN: 4.0000 mg | Freq: Four times a day (QID) | INTRAMUSCULAR | Status: DC | PRN

## 2019-02-26 MED ORDER — GLYCERIN (LAXATIVE) 2.1 G RE SUPP
1.0000 | Freq: Once | RECTAL | Status: AC
  Administered 2019-02-26: 1 via RECTAL
  Filled 2019-02-26: qty 1

## 2019-02-26 MED ORDER — LORAZEPAM 0.5 MG PO TABS: 0.5000 mg | ORAL_TABLET | Freq: Three times a day (TID) | ORAL | Status: DC

## 2019-02-26 MED ORDER — LORAZEPAM 2 MG/ML IJ SOLN
1.0000 mg | Freq: Once | INTRAMUSCULAR | Status: AC
  Administered 2019-02-26: 16:00:00 1 mg via INTRAVENOUS
  Filled 2019-02-26: qty 1

## 2019-02-26 MED ORDER — SODIUM CHLORIDE 0.9 % IV SOLN: INTRAVENOUS | Status: DC

## 2019-02-26 MED ORDER — IOHEXOL 300 MG/ML  SOLN
100.0000 mL | Freq: Once | INTRAMUSCULAR | Status: AC | PRN
  Administered 2019-02-26: 100 mL via INTRAVENOUS

## 2019-02-26 MED ORDER — LORAZEPAM 0.5 MG PO TABS: 0.5000 mg | ORAL_TABLET | Freq: Four times a day (QID) | ORAL | Status: DC | PRN

## 2019-02-26 MED ORDER — LEVOFLOXACIN IN D5W 750 MG/150ML IV SOLN: 750.0000 mg | Freq: Once | INTRAVENOUS | Status: DC

## 2019-02-26 MED ORDER — LORATADINE 10 MG PO TABS
10.0000 mg | ORAL_TABLET | Freq: Every day | ORAL | Status: DC
  Administered 2019-02-27 – 2019-03-01 (×3): 10 mg via ORAL
  Filled 2019-02-26 (×3): qty 1

## 2019-02-26 MED ORDER — LORAZEPAM 1 MG PO TABS
1.0000 mg | ORAL_TABLET | Freq: Every morning | ORAL | Status: DC
  Administered 2019-02-27 – 2019-03-01 (×3): 1 mg via ORAL
  Filled 2019-02-26 (×3): qty 1

## 2019-02-26 MED ORDER — METRONIDAZOLE IN NACL 5-0.79 MG/ML-% IV SOLN
500.0000 mg | Freq: Once | INTRAVENOUS | Status: AC
  Administered 2019-02-26: 500 mg via INTRAVENOUS
  Filled 2019-02-26: qty 100

## 2019-02-26 MED ORDER — POLYETHYLENE GLYCOL 3350 17 G PO PACK
17.0000 g | PACK | Freq: Every day | ORAL | Status: DC
  Administered 2019-02-26 – 2019-02-28 (×2): 17 g via ORAL
  Filled 2019-02-26 (×2): qty 1

## 2019-02-26 MED ORDER — ENOXAPARIN SODIUM 40 MG/0.4ML ~~LOC~~ SOLN
40.0000 mg | SUBCUTANEOUS | Status: DC
  Administered 2019-02-26 – 2019-03-01 (×4): 40 mg via SUBCUTANEOUS
  Filled 2019-02-26 (×4): qty 0.4

## 2019-02-26 MED ORDER — SODIUM CHLORIDE 0.9 % IV SOLN
2.0000 g | INTRAVENOUS | Status: DC
  Administered 2019-02-26 – 2019-02-28 (×4): 2 g via INTRAVENOUS
  Filled 2019-02-26: qty 20
  Filled 2019-02-26 (×3): qty 2

## 2019-02-26 MED ORDER — POTASSIUM CHLORIDE 20 MEQ/15ML (10%) PO SOLN
40.0000 meq | Freq: Once | ORAL | Status: DC
  Filled 2019-02-26: qty 30

## 2019-02-26 MED ORDER — ALBUTEROL SULFATE (2.5 MG/3ML) 0.083% IN NEBU: 3.0000 mL | INHALATION_SOLUTION | Freq: Four times a day (QID) | RESPIRATORY_TRACT | Status: DC | PRN

## 2019-02-26 MED ORDER — LETROZOLE 2.5 MG PO TABS
2.5000 mg | ORAL_TABLET | Freq: Every day | ORAL | Status: DC
  Administered 2019-02-27 – 2019-03-01 (×3): 2.5 mg via ORAL
  Filled 2019-02-26 (×4): qty 1

## 2019-02-26 MED ORDER — SODIUM CHLORIDE 0.9 % IV SOLN
500.0000 mg | INTRAVENOUS | Status: DC
  Administered 2019-02-26 – 2019-02-28 (×3): 500 mg via INTRAVENOUS
  Filled 2019-02-26 (×4): qty 500

## 2019-02-26 MED ORDER — SENNOSIDES-DOCUSATE SODIUM 8.6-50 MG PO TABS
2.0000 | ORAL_TABLET | Freq: Two times a day (BID) | ORAL | Status: DC
  Administered 2019-02-26 – 2019-03-01 (×6): 2 via ORAL
  Filled 2019-02-26 (×7): qty 2

## 2019-02-26 MED ORDER — LORAZEPAM 0.5 MG PO TABS
0.5000 mg | ORAL_TABLET | Freq: Two times a day (BID) | ORAL | Status: DC
  Administered 2019-02-27 – 2019-02-28 (×4): 0.5 mg via ORAL
  Filled 2019-02-26 (×5): qty 1

## 2019-02-26 MED ORDER — ACETAMINOPHEN 650 MG RE SUPP: 650.0000 mg | Freq: Four times a day (QID) | RECTAL | Status: DC | PRN

## 2019-02-26 MED ORDER — DULOXETINE HCL 60 MG PO CPEP
60.0000 mg | ORAL_CAPSULE | Freq: Every day | ORAL | Status: DC
  Administered 2019-02-27 – 2019-03-01 (×3): 60 mg via ORAL
  Filled 2019-02-26 (×4): qty 1

## 2019-02-26 MED ORDER — CALCIUM CARBONATE-VITAMIN D 500-200 MG-UNIT PO TABS
1.0000 | ORAL_TABLET | Freq: Every day | ORAL | Status: DC
  Filled 2019-02-26: qty 1

## 2019-02-26 MED ORDER — AMLODIPINE BESYLATE 10 MG PO TABS
10.0000 mg | ORAL_TABLET | Freq: Every day | ORAL | Status: DC
  Administered 2019-02-28: 21:00:00 10 mg via ORAL
  Filled 2019-02-26 (×2): qty 1

## 2019-02-26 MED ORDER — IPRATROPIUM BROMIDE 0.03 % NA SOLN
2.0000 | Freq: Three times a day (TID) | NASAL | Status: DC
  Administered 2019-02-27 – 2019-03-01 (×7): 2 via NASAL
  Filled 2019-02-26: qty 30

## 2019-02-26 MED ORDER — LAMOTRIGINE 100 MG PO TABS
300.0000 mg | ORAL_TABLET | Freq: Two times a day (BID) | ORAL | Status: DC
  Administered 2019-02-27 – 2019-03-01 (×5): 300 mg via ORAL
  Filled 2019-02-26: qty 12
  Filled 2019-02-26 (×11): qty 3

## 2019-02-26 NOTE — ED Provider Notes (Signed)
2:30 PM Patient had a 10-15 second witnessed tonic clonic seizure activity. Did de-saturate with event, oxygen improved with increased Rodriguez Hevia. No anti-epileptics given.   On chart review, does have hx of seizures and at least back in October 2020, patient had scheduled Ativan on her MAR.  She has not received any during her stay thus far.   Perhaps withdrawal seizure vs lowered seizure threshold due to infection.   Updated hospitalist on interval events.  He will place order for Ativan. Will continue to monitor.   Lilia Pro., MD 02/26/19 [phone]

## 2019-02-26 NOTE — ED Notes (Signed)
Patient frequently puts fingers down her throat, stops with direction, but will continue after turning away. Pulse ox replaced on finger and safety mitts put in place.

## 2019-02-26 NOTE — ED Notes (Signed)
Pt had a clean/dry brief placed on her. Pt currently back to resting in bed. Tech still at bedside.

## 2019-02-26 NOTE — ED Notes (Signed)
Pt resting in bed. Pt denies having to use the restroom. Pt's brief is clean and dry. Pt does refuse to take of yellow shirt to be placed in a gown.

## 2019-02-26 NOTE — ED Notes (Signed)
Pt resting at this time. Pt's brief was clean and dry.

## 2019-02-26 NOTE — H&P (Addendum)
History and Physical    Helen Patton TTS:[id number] DOB: 1961/01/16 DOA: 02/25/2019  I have briefly reviewed the patient's prior medical records in [facility]  PCP: Jerrol Banana., MD  Patient coming from: home  Chief Complaint: Vomiting, abdominal pain  HPI: Helen Patton is a 59 y.o. 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  Patient is nonverbal and cannot contribute to the story so HPI is per chart review.  She was in her normal state of health until 1/14 evening when she developed abdominal pain along with nausea and vomiting, was brought to the ED are and had 2 or 3 more emesis episodes since.  No reported fever or chills.  I have called the POA/patient's mother and I was unable to reach her.   ED Course:  In the emergency room she had a low-grade temp of 99.4, heart rate in the 90s-100s, normotensive and initially satting 90% on room air and required 2 L nasal cannula to bring sats at 99%.  Blood work shows an elevated white count of 24.4, and a lactic acid in the 2 range.  Her SARS-CoV-2 was negative.  Urinalysis without bacteria, leukocytes or nitrites. She underwent a CT scan of the abdomen and pelvis which showed large volume retained stool in the distal colon and rectal vault, compatible with constipation, along with possible infiltrates bilateral lungs at the bases left greater than right.  CT scan also raised concern for pancreatitis but may be motion artifact.  CT scan also pertinent for bilateral nonobstructive nephrolithiasis, diverticulosis and an incidental adrenal nodule.  Given hypoxia and pneumonia she was given antibiotics and we are asked to admit   Review of Systems: Unable to obtain review of systems due to  nonverbal state  Past Medical History:  Diagnosis Date  . Anxiety   . Arthritis    osteoarthritis  . Breast cancer ([place]) 2015   [name]  . Cancer ([place]) 11/2013   T1c, Nx; ER/ PR +; Her 2 neg invasive mammary carcinoma left breast. Wide excision only based on Tumor Board review.   . Cataract   . Depression   . Difficulty swallowing   . Falls   . GERD (gastroesophageal reflux disease)   . Hidradenitis 2012   MRSA, on chronic topical suppression  . History of hiatal hernia   . Hyperlipidemia   . Hypertension   . Mental retardation   . MRSA (methicillin resistant staph aureus) culture positive 2012  . Osteoporosis   . Pneumonia    aspiration pneumonia  . Seizures [facility])    last seizure February 2017    Past Surgical History:  Procedure Laterality Date  . BREAST LUMPECTOMY Left 12/07/2013   T1b, NX, ER/PR positive, HER-2/neu not overexpressed, 7 mm.  Not candidate for chemotherapy/sentinel node/radiation based on mental retardation.  Marland Kitchen BREAST SURGERY Left 12/07/2013   T1b, Nx; ER+, PR+, her 2 not amplified. (Not candidate for chemotherapy/radiation based on mental retardation)  . broken leg    . CERVICAL ABLATION    . 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 dislocated hip    . HARDWARE REMOVAL  08/23/2015   Procedure: HARDWARE REMOVAL;  Surgeon: Dereck Leep, MD;  Location: [facility] ORS;  Service: Orthopedics;;  . TOTAL HIP REVISION Right 08/23/2015   Procedure: TOTAL HIP REVISION;  Surgeon: Dereck Leep, MD;  Location: [facility] ORS;  Service: Orthopedics;  Laterality: Right;     reports that she has never smoked. She has never used smokeless tobacco. She reports that she does not drink alcohol or use drugs.  Allergies  Allergen Reactions  . Sulfa Antibiotics Other (See Comments)    "Does not know"  . Keflex [Cephalexin] Other (See Comments)    "Does  not know" unknown    Family History  Problem Relation Age of Onset  . Diabetes Father   . Parkinson's disease Father   . Neuropathy Mother   . Arthritis Mother   . Diabetes Mother   . Autoimmune disease Brother        AIDS  . Cancer Maternal Aunt 63       breast  . Breast cancer Maternal Aunt   . Hypertension Maternal Grandmother   . Cancer Paternal Grandmother     Prior to Admission medications   Medication Sig Start Date End Date Taking? Authorizing Provider  acetaminophen (TYLENOL) 325 MG tablet Take 2 tablets (650 mg total) by mouth every 6 (six) hours as needed. 07/14/17  Yes Jerrol Banana., MD  alendronate (FOSAMAX) 70 MG tablet TAKE 1 TABLET  BY MOUTH WEEKLY EARLY MORNING BEFORE FOOD/MEDS WITH WATER. DO NOT LIE DOWN FOR 30 MINUTES 09/03/18  Yes Jerrol Banana., MD  amLODipine (NORVASC) 10 MG tablet TAKE 1 TABLET BY MOUTH AT BEDTIME 09/03/18  Yes Jerrol Banana., MD  BENZOYL PEROXIDE 5 % external wash USE TOPICALLY ONCE DAILY 01/22/18  Yes Jerrol Banana., MD  calcium-vitamin D (OSCAL WITH D) 500-200 MG-UNIT TABS tablet TAKE 1 TABLET BY MOUTH EVERY DAY WITH BREAKFAST 11/03/18  Yes Jerrol Banana., MD  cetirizine (ZYRTEC) 10 MG tablet Take 1 tablet (10 mg total) by mouth daily. 12/23/18  Yes Jerrol Banana., MD  Cholecalciferol (VITAMIN D) 50 MCG ([phone]) tablet TAKE 1 CAPSULE BY MOUTH EVERY DAY 11/03/18  Yes Jerrol Banana., MD  cyanocobalamin (,VITAMIN B-12,) 1000 MCG/ML injection GIVE 1000 MCG INJECTION "IM" ONCE A MONTH (B-12 SUPPLEMENT) 11/03/18  Yes Jerrol Banana., MD  diphenhydrAMINE (BENADRYL) 12.5 MG chewable tablet Chew 1 tablet (12.5 mg total) by mouth 3 (three) times daily. 04/02/18  Yes Jerrol Banana., MD  docusate sodium (COLACE) 100 MG capsule TAKE 1 CAPSULE BY MOUTH 2 TIMES PER DAY 09/03/18  Yes Jerrol Banana., MD  DULoxetine (CYMBALTA) 30 MG capsule Take 1 capsule (30 mg total) by mouth daily.  04/10/18  Yes Jerrol Banana., MD  DULoxetine (CYMBALTA) 60 MG capsule Take 1 capsule (60 mg total) by mouth daily. 04/10/18  Yes Jerrol Banana., MD  ipratropium (ATROVENT) 0.03 % nasal spray Place 2 sprays into both nostrils 3 (three) times daily. 02/24/18  Yes Bacigalupo, [name], MD  lamoTRIgine (LAMICTAL) 150 MG tablet TAKE 2 TABLETS BY MOUTH 2 TIMES PER DAY 09/03/18  Yes Jerrol Banana., MD  letrozole [facility]) 2.5 MG tablet TAKE 1 TABLET BY MOUTH EVERY DAY 09/03/18  Yes Jerrol Banana., MD  LORazepam (ATIVAN) 1 MG tablet Take 1 tablet by mouth every morning, and 1/2 tablet at noon and bedtime. 12/03/18  Yes Jerrol Banana., MD  losartan (COZAAR) 100  MG tablet Take 1 tablet (100 mg total) by mouth daily. 10/20/18  Yes Jerrol Banana., MD  pantoprazole (PROTONIX) 40 MG tablet TAKE 1 TABLET BY MOUTH 2 TIMES PER DAY **DO NOT CRUSH** 12/23/18  Yes Jerrol Banana., MD  terbinafine (LAMISIL) 1 % cream Use 2 times a week as needed 07/14/17  Yes Jerrol Banana., MD  triamcinolone (KENALOG) 0.025 % ointment Apply 1 application topically 2 (two) times daily as needed.   Yes [provider]  clindamycin (CLEOCIN T) 1 % external solution Apply topically daily. Patient not taking: Reported on 02/25/2019 07/14/17   Jerrol Banana., MD  clindamycin (CLEOCIN T) 1 % lotion APPLY TOPICALLY ONCE DAILY Patient not taking: Reported on 02/25/2019 11/03/18   Jerrol Banana., MD  clotrimazole (LOTRIMIN) 1 % cream APPLY TOPICALLY TWICE A DAY AS NEEDED FOR RASH IN SKIN FOLDS Patient not taking: Reported on 02/25/2019 02/05/17   Jerrol Banana., MD  coal tar (NEUTROGENA T-GEL) 0.5 % shampoo Use as directed Patient not taking: Reported on 02/25/2019 01/30/18   Jerrol Banana., MD    Physical Exam: Vitals:   02/26/19 0030 02/26/19 0710 02/26/19 0918 02/26/19 0938  BP: (!) 146/66 137/65 128/69   Pulse: 96 99 92   Resp: _0 Temp:   98.8 F (37.1 C)    TempSrc:  Oral    SpO2: 99% 97% 99%   Weight:    72.6 kg   Constitutional: Sitting in bed on the side in fetal position, nonverbal Eyes: No scleral icterus noted ENMT: Mucous membranes are moist.  Drooling Neck: normal, supple Respiratory: Shallow respirations but overall clear without wheezing or crackles Cardiovascular: Regular rate and rhythm, no murmurs / rubs / gallops. No extremity edema. 2+ pedal pulses.  Abdomen: Mildly tender to palpation in all 4 quadrants, no guarding or rebound.  Bowel sounds positive Skin: no rashes, lesions, ulcers. No induration Neurologic: Does not follow commands consistently but appears to move all 4 extremities independently Psychiatric: Unable to assess  Labs on Admission: I have personally reviewed following labs and imaging studies  CBC: Recent Labs  Lab 02/25/19 2325  WBC 24.4*  NEUTROABS 19.8*  HGB 11.4*  HCT 34.4*  MCV 87.1  PLT 097   Basic Metabolic Panel: Recent Labs  Lab 02/25/19 2325  NA 136  K 3.1*  CL 94*  CO2 29  GLUCOSE 241*  BUN 16  CREATININE 0.76  CALCIUM 10.0   Liver Function Tests: Recent Labs  Lab 02/25/19 2325  AST 26  ALT 15  ALKPHOS 76  BILITOT 0.6  PROT 7.9  ALBUMIN 4.4   Coagulation Profile: Recent Labs  Lab 02/25/19 2325  INR 0.9   BNP (last 3 results) No results for input(s): PROBNP in the last 8760 hours. CBG: No results for input(s): GLUCAP in the last 168 hours. Thyroid Function Tests: No results for input(s): TSH, T4TOTAL, FREET4, T3FREE, THYROIDAB in the last 72 hours. Urine analysis:    Component Value Date/Time   COLORURINE STRAW (A) 02/26/2019 0331   APPEARANCEUR CLEAR (A) 02/26/2019 0331   APPEARANCEUR Clear 01/08/2013 1742   LABSPEC 1.031 (H) 02/26/2019 0331   LABSPEC 1.023 01/08/2013 1742   PHURINE 7.0 02/26/2019 0331   GLUCOSEU 50 (A) 02/26/2019 0331   GLUCOSEU Negative 01/08/2013 1742   HGBUR SMALL (A) 02/26/2019 0331   BILIRUBINUR NEGATIVE  02/26/2019 0331   BILIRUBINUR neg 12/24/2017 1641   BILIRUBINUR  Negative 01/08/2013 1742   KETONESUR NEGATIVE 02/26/2019 0331   PROTEINUR NEGATIVE 02/26/2019 0331   UROBILINOGEN 0.2 12/24/2017 1641   NITRITE NEGATIVE 02/26/2019 0331   LEUKOCYTESUR NEGATIVE 02/26/2019 0331   LEUKOCYTESUR Negative 01/08/2013 1742     Radiological Exams on Admission: CT ABDOMEN PELVIS W CONTRAST  Result Date: 02/26/2019 CLINICAL DATA:  Initial evaluation for acute abdominal distension, nausea, vomiting. EXAM: CT ABDOMEN AND PELVIS WITH CONTRAST TECHNIQUE: Multidetector CT imaging of the abdomen and pelvis was performed using the standard protocol following bolus administration of intravenous contrast. CONTRAST:  148m OMNIPAQUE IOHEXOL 300 MG/ML  SOLN COMPARISON:  Prior CT from 12/07/2011. FINDINGS: Lower chest: Patchy consolidative opacity seen within the left greater than right lung bases, somewhat suspicious for infiltrates, particularly at the left lung base where a few small air bronchograms are visible. Partially visualized lung bases are otherwise clear. Hepatobiliary: Liver demonstrates a normal contrast enhanced appearance. Focal fat deposition noted adjacent to the falciform ligament. Gallbladder within normal limits. Pancreas: Apparent ill definition of the pancreas with question of peripancreatic stranding, favored to be related to motion artifact. Possible degree of mild acute pancreatitis somewhat difficult to exclude. Pancreas otherwise unremarkable. Spleen: Spleen within normal limits. Adrenals/Urinary Tract: 1.3 cm nodule present at the right adrenal gland, indeterminate. Left adrenal gland unremarkable. Kidneys equal in size with symmetric enhancement. 4.9 cm simple cyst present at the interpolar right kidney. Additional 2.2 cm cyst present at the interpolar left kidney. Multiple additional scattered subcentimeter hypodensities too small the characterize, but statistically likely reflect small cysts as  well. Probable few scattered nonobstructive calculi present within the kidneys bilaterally, largest of which seen at the upper pole on the left and measures 3 mm. No radiopaque calculi seen along the course of either renal collecting system. No hydronephrosis or hydroureter. No focal enhancing renal mass. Partially distended bladder grossly unremarkable, although evaluation limited by streak artifact from metallic hardware about the hips bilaterally. Stomach/Bowel: Moderate hiatal hernia. Stomach otherwise unremarkable. No evidence for bowel obstruction. Normal appendix. Mild colonic diverticulosis without evidence for acute diverticulitis. No acute inflammatory changes seen about the bowels. Large volume retained stool seen impacted within the distal colon and rectal vault, compatible with constipation. Vascular/Lymphatic: Normal intravascular enhancement seen throughout the intra-abdominal aorta. Moderate aorto bi-iliac atherosclerotic disease. No aneurysm. Mesenteric vessels patent proximally. No adenopathy. Reproductive: Uterus and right ovary not well visualized due to streak artifact. Normal left ovary. No adnexal mass. Other: No free air or fluid. Musculoskeletal: No acute osseous abnormality. No discrete or worrisome osseous lesions. Right total hip arthroplasty in place. Fixation screws in place at the left hip. Few small calcifications noted within the inferior left breast, of doubtful significance. External soft tissues otherwise unremarkable. IMPRESSION: 1. Large volume retained stool impacted within the distal colon and rectal vault, compatible with constipation. 2. Patchy consolidative opacity within the left greater than right lung bases, suspicious for possible infiltrates. 3. Apparent ill definition of the pancreas with question of peri-pancreatic stranding, favored to be related to motion artifact. Possible degree of acute pancreatitis somewhat difficult to exclude. Correlation with serum lipase  recommended. 4. Bilateral nonobstructive nephrolithiasis. 5. Colonic diverticulosis without evidence for acute diverticulitis. 6. Moderate hiatal hernia. 7. 1.3 cm right adrenal nodule, indeterminate. Further evaluation with dedicated adrenal mass protocol CT recommended. This could be performed on an outpatient nonemergent basis. Electronically Signed   By: BJeannine BogaM.D.   On: 02/26/2019 01:25   DG Abdomen Acute W/Chest  Result Date: 02/25/2019 CLINICAL  DATA:  Vomiting, abdominal pain EXAM: DG ABDOMEN ACUTE W/ 1V CHEST COMPARISON:  10/07/2011 FINDINGS: Heart is normal size. Lungs clear. No effusions. Small hiatal hernia. There is normal bowel gas pattern. No free air. No organomegaly or suspicious calcification. No acute bony abnormality. IMPRESSION: No evidence of bowel obstruction or free air. No acute cardiopulmonary disease. Electronically Signed   By: Rolm Baptise M.D.   On: 02/25/2019 23:02    EKG: Independently reviewed.  Pending  Assessment/Plan  Principal Problem Sepsis due to pneumonia -Possibly CAP versus aspiration pneumonia, unclear if her infiltrates appeared after her emesis or before -Cover with ceftriaxone, azithromycin along with metronidazole -Cultures obtained in the ED. SARS-CoV-2 negative.  Strep pneumo and Legionella sent -Keep n.p.o. until speech evaluates -Continue IV fluids  Active Problems Nausea, vomiting, abdominal pain -?  A degree of ileus from severe constipation -Keep n.p.o. except meds, IV fluids, aggressive bowel regimen with oral agents as well as suppository, may need disimpaction/enema if no results  History of breast cancer -Continue home letrozole  Essential hypertension -Resume home amlodipine, hold losartan for now  GERD -Continue PPI  History of seizure disorder -Continue home Lamictal  Addendum: received message that patient had a brief generalized seizure lasting 15-20 seconds, stopped on its own. She is getting scheduled  ativan at [facility] group home and possibly she might have missed her dose this morning, in addition she has underlying infectious process which may lower her seizure threshold. Will administer 1 mg IV x 1 now and scheduled her oral home regimen starting this evening.   Depression -Continue Cymbalta  Cognitive decline/intellectual disability/nonverbal state -Monitor   DVT prophylaxis: Lovenox Code Status: Presumed full code Family Communication: No family at bedside, unable to reach mother, will try again later Disposition Plan: Home when ready Bed Type: MedSurg Consults called: None Obs/Inp: Observation  Marzetta Board, MD, PhD [facility]  Contact via [email or web address]  02/26/2019, 10:20 AM

## 2019-02-26 NOTE — ED Notes (Signed)
Patient's brief was changed. Patient was incontinent of urine. Patient's linens were changed.

## 2019-02-26 NOTE — ED Provider Notes (Signed)
I assumed care of the patient from Dr. Ellender Hose at 12:00 AM.  Patient met sepsis criteria given tachycardia fever leukocytosis elevated lactic acid.  As such sepsis protocol initiated with patient receiving appropriate antibiotic therapy.  CT scan of the abdomen revealed possible infiltrates in the bilateral lower lobes of the lung which were not reported on chest x-ray.  COVID-19 and influenza testing performed which were all negative.  Patient discussed with Dr. Danella Penton for hospital admission further evaluation and management of sepsis most likely secondary to pneumonia.   Gregor Hams, MD 02/26/19 [phone]

## 2019-02-26 NOTE — Evaluation (Signed)
Clinical/Bedside Swallow Evaluation Patient Details  Name: Helen Patton MRN: [id number] Date of Birth: Jul 02, 1960  Today's Date: 02/26/2019 Time: SLP Start Time (ACUTE ONLY): 0935 SLP Stop Time (ACUTE ONLY): 1025 SLP Time Calculation (min) (ACUTE ONLY): 50 min  Past Medical History:  Past Medical History:  Diagnosis Date  . Anxiety   . Arthritis    osteoarthritis  . Breast cancer ([place]) 2015   [name]  . Cancer ([place]) 11/2013   T1c, Nx; ER/ PR +; Her 2 neg invasive mammary carcinoma left breast. Wide excision only based on Tumor Board review.   . Cataract   . Depression   . Difficulty swallowing   . Falls   . GERD (gastroesophageal reflux disease)   . Hidradenitis 2012   MRSA, on chronic topical suppression  . History of hiatal hernia   . Hyperlipidemia   . Hypertension   . Mental retardation   . MRSA (methicillin resistant staph aureus) culture positive 2012  . Osteoporosis   . Pneumonia    aspiration pneumonia  . Seizures ([place])    last seizure February 2017   Past Surgical History:  Past Surgical History:  Procedure Laterality Date  . BREAST LUMPECTOMY Left 12/07/2013   T1b, NX, ER/PR positive, HER-2/neu not overexpressed, 7 mm.  Not candidate for chemotherapy/sentinel node/radiation based on mental retardation.  Marland Kitchen BREAST SURGERY Left 12/07/2013   T1b, Nx; ER+, PR+, her 2 not amplified. (Not candidate for chemotherapy/radiation based on mental retardation)  . broken leg    . CERVICAL ABLATION    . 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 dislocated hip    . HARDWARE REMOVAL  08/23/2015   Procedure: HARDWARE REMOVAL;  Surgeon: Dereck Leep, MD;  Location: [facility] ORS;  Service: Orthopedics;;  . TOTAL HIP REVISION Right 08/23/2015   Procedure: TOTAL HIP REVISION;  Surgeon: Dereck Leep, MD;  Location: [facility] ORS;  Service: Orthopedics;   Laterality: Right;   HPI:  Pt is a 59 y.o. female with medical history significant for Multiple medical issues including GERD, Moderate Hiatal Hernia, CP, breast cancer status post lumpectomy, HTN, HLD, mental retardation nonverbal at baseline, recurrent aspiration pneumonia, seizure disorder, who comes to the hospital brought by family for abdominal pain and vomiting with brown-green emesis.  Patient is nonverbal and cannot contribute to the story so HPI is per chart review.  She was in her normal state of health until 1/14 evening when she developed abdominal pain along with nausea and vomiting, was brought to the ED are and had 2 or 3 more emesis episodes since.  No reported fever or chills.  She underwent a CT scan of the abdomen and pelvis which showed large volume retained stool in the distal colon and rectal vault, compatible with constipation, along with possible infiltrates bilateral lungs at the bases left greater than right.  CT scan also raised concern for pancreatitis but may be motion artifact.  CT scan also pertinent for bilateral nonobstructive nephrolithiasis, diverticulosis and an incidental adrenal nodule.  Noted pt's BMI.  Pt has not had any recent CXR Imaging in ~3 years.  An EGD and MBSS were noted in 2017: EGD: "presbyesophagus w/ Hiatal Hernia and retention of barium tablet"; MBSS: Esophageal phase dysmotility and retention of bolus, minimal pharyngeal dysphagia with moderate-severe behavioral / cognitive issues increasing her risk for aspiration(her usual diet recommended w/  Supervision to Curb Impulsive eating/drinking behaviors).    Assessment / Plan / Recommendation Clinical Impression  Pt appears to present w/ oral phase dysphagia significantly impacted by her Cognitive decline and impulsive eating/drinking behaviors from MR. This increases her risk for choking/aspiration. Pt also has a baseline of GERD and Hiatal Hernia w/ Esophageal Dysmotility/Presbyesophagus noted per EGD in  2017. This presentation of Esophageal dysmotility can increase pt's risk for Regurgitation and aspiration of Reflux material thus Pulmonary impact/decline. During this assessment, pt did not appear to want/desire much po - Miralax being given to encourage a BM. Pt appeared to adequately tolerate trials of puree and thin liquids via straw w/ monitoring amount of liquid pulled through the straw to reduce if needed and lessen any impulsive drinking. Pt demonstrated No overt clinicial s/s of aspiration; no decline in O2 sats or respiratory effort. Oral phase c/b quick swallows; no oral holding or delay in A-P transfer w/ trials of thin liquids and the puree. When pt was given a trial of broken down solid food, she accepted the bolus then swallowed w/out chewing - NO Mastication of the bolus was noted. This increases risk for choking/aspiration. Suspect this behavior is Baseline for pt. It can greatly impact Esophageal clearing w/ a baseline of a Hiatal Hernia and increase risk for Regurgitation. No unilateral weakness noted around the oral cavity w/ bolus management. Slight open mouth posture noted at rest baseline. Pt required full assistance w/ feeding. She did not appear to want much po - has not had a BM.  Recommend a Pureed diet (at most for consistency) d/t Impulsive eating/swallowing behaviors; Thin liquids. Supervision w/ all oral intake to Curb Impulsive eating/drinking behaviors as recommended in the past. General aspiration precautions. Pills Crushed in Puree for Esophageal clearing.  SLP Visit Diagnosis: Dysphagia, oral phase (R13.11)(Cognitive decline; MR)    Aspiration Risk  Mild aspiration risk;Risk for inadequate nutrition/hydration(reduced following precautions)    Diet Recommendation  Pureed diet (d/t lack of chewing of solids b/f swallowing); Thin liquids. 100% Supervision during meals to Curb impulsive eating/drinking behaviors. General aspiration precautions; REFLUX Precautions  Medication  Administration: Crushed with puree    Other  Recommendations Recommended Consults: Consider GI evaluation;Consider esophageal assessment(Dietician f/u) Oral Care Recommendations: Oral care BID;Oral care before and after PO;Staff/trained caregiver to provide oral care Other Recommendations: (n/a)   Follow up Recommendations None      Frequency and Duration (n/a)  (n/a)       Prognosis Prognosis for Safe Diet Advancement: Fair Barriers to Reach Goals: Cognitive deficits;Time post onset;Severity of deficits;Behavior Barriers/Prognosis Comment: baseline      Swallow Study   General Date of Onset: 02/25/19 HPI: Pt is a 59 y.o. female with medical history significant for Multiple medical issues including GERD, Moderate Hiatal Hernia, CP, breast cancer status post lumpectomy, HTN, HLD, mental retardation nonverbal at baseline, recurrent aspiration pneumonia, seizure disorder, who comes to the hospital brought by family for abdominal pain and vomiting with brown-green emesis.  Patient is nonverbal and cannot contribute to the story so HPI is per chart review.  She was in her normal state of health until 1/14 evening when she developed abdominal pain along with nausea and vomiting, was brought to the ED are and had 2 or 3 more emesis episodes since.  No reported fever or chills.  She underwent a CT scan of the abdomen and pelvis which showed large volume retained stool in the distal colon and rectal vault, compatible with constipation, along with possible  infiltrates bilateral lungs at the bases left greater than right.  CT scan also raised concern for pancreatitis but may be motion artifact.  CT scan also pertinent for bilateral nonobstructive nephrolithiasis, diverticulosis and an incidental adrenal nodule.  Noted pt's BMI.  Pt has not had any recent CXR Imaging in ~3 years.  An EGD and MBSS were noted in 2017: EGD: "presbyesophagus w/ Hiatal Hernia and retention of barium tablet"; MBSS: Esophageal  phase dysmotility and retention of bolus, minimal pharyngeal dysphagia with moderate-severe behavioral / cognitive issues increasing her risk for aspiration(her usual diet recommended w/ Supervision to Curb Impulsive eating/drinking behaviors).  Type of Study: Bedside Swallow Evaluation Previous Swallow Assessment: 2017; MBSS and EGD in 2017 Diet Prior to this Study: NPO(unsure of diet at facility) Temperature Spikes Noted: No(wbc 24.4) Respiratory Status: Nasal cannula(2L) History of Recent Intubation: No Behavior/Cognition: Alert;Cooperative;Pleasant mood;Confused;Distractible;Requires cueing;Impulsive Oral Cavity Assessment: (could not assess fully d/t participation) Oral Care Completed by SLP: Recent completion by staff Oral Cavity - Dentition: Poor condition;Missing dentition Vision: (n/a) Self-Feeding Abilities: Total assist Patient Positioning: Postural control adequate for testing(pt sat herself upright for the few trials) Baseline Vocal Quality: Low vocal intensity(phonations) Volitional Cough: Cognitively unable to elicit Volitional Swallow: Unable to elicit    Oral/Motor/Sensory Function Overall Oral Motor/Sensory Function: Within functional limits(grossly; noted min open mouth posture at rest; no unilateral)   Ice Chips Ice chips: Not tested   Thin Liquid Thin Liquid: Within functional limits Presentation: Straw(fed; 8 trials total) Other Comments: monitored cup/boluses    Nectar Thick Nectar Thick Liquid: Not tested   Honey Thick Honey Thick Liquid: Not tested   Puree Puree: Within functional limits(grossly) Presentation: Spoon(fed; 4 trials) Other Comments: min quick to swallow   Solid     Solid: Impaired Presentation: Spoon(fed; 1 trial) Oral Phase Impairments: Poor awareness of bolus;Impaired mastication(impulsive and swallowed w/out chewing) Oral Phase Functional Implications: (impulsive) Pharyngeal Phase Impairments: (none)       Orinda Kenner, MS,  CCC-SLP Glennie Bose 02/26/2019,12:54 PM

## 2019-02-26 NOTE — ED Notes (Addendum)
Pt resting in bed at this time. This tech at bedside.

## 2019-02-26 NOTE — Evaluation (Signed)
Physical Therapy Evaluation Patient Details Name: Helen Patton MRN: [id number] DOB: Jul 09, 1960 Today's Date: 02/26/2019   History of Present Illness  From MD notes: Pt is a 59 y.o. 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  MD assessment includes: Sepsis due to pneumonia, Nausea, vomiting, abdominal pain, and HTN.    Clinical Impression  Pt found on 2LO2/min in sidelying with HR and SpO2 WNL.  Pt anxious and agitated during the session and unable to follow commands.  Pt did demonstrate good functional strength going back and forth multiple times from left sidelying to right sidelying with very good speed and effort.  Pt unable to follow commands to come to sitting and was gently assisted to sitting at the EOB where she demonstrated good unsupported sitting balance.  This PT and the CNA provided max encouragement to get the pt to attempt to stand but she continuously shook her head no and pointed to her pillow.  Pt then abruptly returned to sidelying with very good speed and effort.  Pt demonstrated no functional deficits in strength as demonstrated by multiple bed mobility tasks with further assessment limited by cognition.  Will complete PT order at this time but will reassess pt pending a change in status upon receipt of new PT orders.         Follow Up Recommendations No PT follow up    Equipment Recommendations  None recommended by PT    Recommendations for Other Services       Precautions / Restrictions Precautions Precautions: Fall Restrictions Weight Bearing Restrictions: No      Mobility  Bed Mobility Overal bed mobility: Independent             General bed mobility comments: Pt quickly with good speed and effort went from R  sidelying to L sidelying multiple times as well from sit to sup with very good speed and effort  Transfers                 General transfer comment: Unabe to assess secondary to pt agitation and refusal to attempt  Ambulation/Gait                Stairs            Wheelchair Mobility    Modified Rankin (Stroke Patients Only)       Balance Overall balance assessment: No apparent balance deficits (not formally assessed)(Normal sitting balance unsupported at the EOB)                                           Pertinent Vitals/Pain Pain Assessment: Faces Pain Score: 0-No pain    Home Living Family/patient expects to be discharged to:: Unsure                 Additional Comments: Pt non-verbal, no family present to assist with history    Prior Function           Comments: Per chart review pt walks at home without the use of an AD     Hand Dominance        Extremity/Trunk Assessment   Upper Extremity Assessment Upper Extremity Assessment: Difficult to assess due to impaired  cognition    Lower Extremity Assessment Lower Extremity Assessment: Difficult to assess due to impaired cognition       Communication   Communication: Expressive difficulties  Cognition Arousal/Alertness: Awake/alert Behavior During Therapy: Anxious;Agitated Overall Cognitive Status: No family/caregiver present to determine baseline cognitive functioning                                        General Comments      Exercises     Assessment/Plan    PT Assessment Patent does not need any further PT services  PT Problem List         PT Treatment Interventions      PT Goals (Current goals can be found in the Care Plan section)  Acute Rehab PT Goals PT Goal Formulation: All assessment and education complete, DC therapy    Frequency     Barriers to discharge        Co-evaluation               AM-PAC PT "6  Clicks" Mobility  Outcome Measure Help needed turning from your back to your side while in a flat bed without using bedrails?: None Help needed moving from lying on your back to sitting on the side of a flat bed without using bedrails?: None Help needed moving to and from a bed to a chair (including a wheelchair)?: A Little Help needed standing up from a chair using your arms (e.g., wheelchair or bedside chair)?: A Little Help needed to walk in hospital room?: A Little Help needed climbing 3-5 steps with a railing? : A Little 6 Click Score: 20    End of Session Equipment Utilized During Treatment: Oxygen Activity Tolerance: Treatment limited secondary to agitation Patient left: in bed;with nursing/sitter in room;Other (comment)(Pt has a Actuary in the ER) Nurse Communication: Mobility status PT Visit Diagnosis: Muscle weakness (generalized) (M62.81)    Time: [id number] PT Time Calculation (min) (ACUTE ONLY): 15 min   Charges:   PT Evaluation $PT Eval Low Complexity: 1 Low          D. Royetta Asal PT, DPT 02/26/19, 12:36 PM

## 2019-02-26 NOTE — ED Notes (Signed)
Per Edison Nasuti ED tech report, patient had a brief 10-15 second full body seizure. Patient was post-ictal upon this writer's arrival and soon become at baseline. Dr. Joan Mayans at bedside. Patient's O2 sat appeared to drop to the 60's, but quickly rebounded to the mid-90's on 3 L. Patient prefers to lie in a prone position and was resistant to being moved over to a supine position.

## 2019-02-26 NOTE — ED Notes (Signed)
Pt laying in stretcher at this time. Pt is awake but is resting. This tech still at bedside. Pt's brief is currently dry and pt shook head no when asked if she needed to use the restroom.

## 2019-02-26 NOTE — ED Notes (Signed)
Patient is resting in darkened room. 2L O2 reapplied and pulse ox is 95-97%.

## 2019-02-26 NOTE — ED Notes (Signed)
Edison Nasuti ED tech at bedside for patient's safety. Patient is restless, pulling at cords and O2 [place]. Patient is 93-95% on room air. Pulse ox remains on finger beneath safety mitts. Blood pressure cuff removed until time to blood pressure for patient's comfort.

## 2019-02-26 NOTE — ED Notes (Signed)
Spoke with Aquita (mom) on phone. She reports Haliegh baseline walks at home without walker or cane. Patient is baseline non-verbal. Reports she had surgery on right hip years ago.

## 2019-02-26 NOTE — ED Notes (Signed)
Pt caregiver departed for home. NT sitter in room. Pt has agitated and continuously rolling to her right, wrapping her vitals machine cords, IV cord and O2 tubing around her body. When this nurse tried to remove and replace them, the patient began flailing her arms and would not allow them to be replaced. After attempts to replace them stopped, she settled down and remained stationary. Dr Owens Shark notified.

## 2019-02-26 NOTE — Progress Notes (Signed)
Notified bedside nurse of need to draw lactic acid.  

## 2019-02-27 DIAGNOSIS — A419 Sepsis, unspecified organism: Principal | ICD-10-CM

## 2019-02-27 LAB — URINE CULTURE: Culture: NO GROWTH

## 2019-02-27 LAB — COMPREHENSIVE METABOLIC PANEL
ALT: 15 U/L (ref 0–44)
AST: 24 U/L (ref 15–41)
Albumin: 3.6 g/dL (ref 3.5–5.0)
Alkaline Phosphatase: 58 U/L (ref 38–126)
Anion gap: 11 (ref 5–15)
BUN: 21 mg/dL — ABNORMAL HIGH (ref 6–20)
CO2: 27 mmol/L (ref 22–32)
Calcium: 8.4 mg/dL — ABNORMAL LOW (ref 8.9–10.3)
Chloride: 107 mmol/L (ref 98–111)
Creatinine, Ser: 0.56 mg/dL (ref 0.44–1.00)
GFR calc Af Amer: >60 mL/min (ref >60)
GFR calc non Af Amer: >60 mL/min (ref >60)
Glucose, Bld: 98 mg/dL (ref 70–99)
Potassium: 2.7 mmol/L — CL (ref 3.5–5.1)
Sodium: 145 mmol/L (ref 135–145)
Total Bilirubin: 0.6 mg/dL (ref 0.3–1.2)
Total Protein: 6.8 g/dL (ref 6.5–8.1)

## 2019-02-27 LAB — CBC
HCT: 29.3 % — ABNORMAL LOW (ref 36.0–46.0)
Hemoglobin: 9.3 g/dL — ABNORMAL LOW (ref 12.0–15.0)
MCH: 28 pg (ref 26.0–34.0)
MCHC: 31.7 g/dL (ref 30.0–36.0)
MCV: 88.3 fL (ref 80.0–100.0)
Platelets: 301 10*3/uL (ref 150–400)
RBC: 3.32 MIL/uL — ABNORMAL LOW (ref 3.87–5.11)
RDW: 15.2 % (ref 11.5–15.5)
WBC: 17.8 10*3/uL — ABNORMAL HIGH (ref 4.0–10.5)
nRBC: 0 % (ref 0.0–0.2)

## 2019-02-27 MED ORDER — LEVETIRACETAM IN NACL 500 MG/100ML IV SOLN
500.0000 mg | Freq: Two times a day (BID) | INTRAVENOUS | Status: DC
  Administered 2019-02-27 – 2019-03-01 (×4): 500 mg via INTRAVENOUS
  Filled 2019-02-27 (×5): qty 100

## 2019-02-27 MED ORDER — SODIUM CHLORIDE 0.9% FLUSH: 10.0000 mL | INTRAVENOUS | Status: DC | PRN

## 2019-02-27 MED ORDER — POTASSIUM CHLORIDE CRYS ER 20 MEQ PO TBCR
40.0000 meq | EXTENDED_RELEASE_TABLET | ORAL | Status: AC
  Administered 2019-02-27 (×2): 40 meq via ORAL
  Filled 2019-02-27 (×3): qty 2

## 2019-02-27 MED ORDER — SODIUM CHLORIDE 0.9% FLUSH
10.0000 mL | Freq: Two times a day (BID) | INTRAVENOUS | Status: DC
  Administered 2019-02-28: 10 mL

## 2019-02-27 MED ORDER — LEVETIRACETAM IN NACL 1500 MG/100ML IV SOLN
1500.0000 mg | INTRAVENOUS | Status: AC
  Filled 2019-02-27 (×2): qty 100

## 2019-02-27 MED ORDER — LACTATED RINGERS IV SOLN: INTRAVENOUS | Status: DC

## 2019-02-27 MED ORDER — LORAZEPAM 2 MG/ML IJ SOLN
1.0000 mg | Freq: Once | INTRAMUSCULAR | Status: AC
  Administered 2019-02-27: 06:00:00 1 mg via INTRAVENOUS
  Filled 2019-02-27: qty 1

## 2019-02-27 MED ORDER — VITAMIN D 25 MCG (1000 UNIT) PO TABS
1000.0000 [IU] | ORAL_TABLET | Freq: Every day | ORAL | Status: DC
  Administered 2019-02-28 – 2019-03-01 (×2): 1000 [IU] via ORAL
  Filled 2019-02-27 (×2): qty 1

## 2019-02-27 MED ORDER — LORAZEPAM 2 MG/ML IJ SOLN: 2.0000 mg | INTRAMUSCULAR | Status: DC | PRN

## 2019-02-27 MED ORDER — POTASSIUM CHLORIDE 10 MEQ/100ML IV SOLN
10.0000 meq | INTRAVENOUS | Status: AC
  Administered 2019-02-27 (×2): 10 meq via INTRAVENOUS
  Filled 2019-02-27 (×2): qty 100

## 2019-02-27 MED ORDER — LORAZEPAM 2 MG/ML IJ SOLN
0.5000 mg | Freq: Once | INTRAMUSCULAR | Status: AC
  Administered 2019-02-27: 0.5 mg via INTRAVENOUS
  Filled 2019-02-27: qty 1

## 2019-02-27 MED ORDER — CALCIUM CARBONATE 1250 (500 CA) MG PO TABS
1.0000 | ORAL_TABLET | Freq: Every day | ORAL | Status: DC
  Administered 2019-02-28 – 2019-03-01 (×2): 500 mg via ORAL
  Filled 2019-02-27 (×2): qty 1

## 2019-02-27 MED ORDER — LEVETIRACETAM IN NACL 1500 MG/100ML IV SOLN
1500.0000 mg | INTRAVENOUS | Status: AC
  Administered 2019-02-27 (×2): 1500 mg via INTRAVENOUS
  Filled 2019-02-27: qty 100

## 2019-02-27 NOTE — Progress Notes (Signed)
CRITICAL VALUE ALERT  Critical Value:  K+ = 2.7  Date & Time Notied:  02/27/2019 0734  Provider Notified: Marlowe Sax MD  Orders Received/Actions taken: see orders entered in [facility] at [phone] and [phone]

## 2019-02-27 NOTE — Progress Notes (Signed)
[facility] Progress Note  Patient: Helen Patton    [id number]  DOA: 02/25/2019     Date of Service: the patient was seen and examined on 02/27/2019  Chief Complaint  Patient presents with  . Emesis  . Abdominal Pain   Brief hospital course:  Helen Patton is a 59 y.o. 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  Patient is nonverbal and cannot contribute to the story so HPI is per chart review.  She was in her normal state of health until 1/14 evening when she developed abdominal pain along with nausea and vomiting, was brought to the ED are and had 2 or 3 more emesis episodes since.  No reported fever or chills.  I have called the POA/patient's mother and I was unable to reach her.   ED Course:  In the emergency room she had a low-grade temp of 99.4, heart rate in the 90s-100s, normotensive and initially satting 90% on room air and required 2 L nasal cannula to bring sats at 99%.  Blood work shows an elevated white count of 24.4, and a lactic acid in the 2 range.  Her SARS-CoV-2 was negative.  Urinalysis without bacteria, leukocytes or nitrites. She underwent a CT scan of the abdomen and pelvis which showed large volume retained stool in the distal colon and rectal vault, compatible with constipation, along with possible infiltrates bilateral lungs at the bases left greater than right.  CT scan also raised concern for pancreatitis but may be motion artifact.  CT scan also pertinent for bilateral nonobstructive nephrolithiasis, diverticulosis and an incidental adrenal nodule.  Given hypoxia and pneumonia she was given antibiotics and we are asked to admit  Currently further plan is continue to treat constipation as well as IV antibiotics.   Assessment and Plan: Sepsis due to pneumonia, POA -Possibly CAP versus aspiration pneumonia, unclear if her infiltrates appeared after her emesis or before -Cover with ceftriaxone, azithromycin along with metronidazole -Cultures obtained in the ED. SARS-CoV-2 negative.  Strep pneumo and Legionella sent -Speech recommends pured diet as it is difficult to assess the patient. -Continue IV fluids  Active Problems Nausea, vomiting, abdominal pain -?  A degree of ileus from severe constipation -.  Liquid diet, IV fluids, aggressive bowel regimen with oral agents as well as suppository, may need disimpaction/enema if no results  History of breast cancer -Continue home letrozole  Essential hypertension -Resume home amlodipine, hold losartan for now  GERD -Continue PPI  History of seizure disorder Acute seizure in the ER -Continue home Lamictal Patient had a brief generalized seizure lasting 15-20 seconds, stopped on its own.  She is getting scheduled ativan at he group home and possibly she might have missed her dose morning, in addition she has underlying infectious process which may lower her seizure threshold.    Depression -Continue Cymbalta  Cognitive decline/intellectual disability/nonverbal state -Monitor  Hypokalemia. Replaced. Monitor.  Diet: Dysphagia 1 diet DVT Prophylaxis: Subcutaneous Heparin    Advance goals of care discussion: Full code  Family Communication: no family was present at bedside, at the time of interview.   Disposition:  Pt is from group home, admitted with pneumonia, sepsis, seizure, still has hypokalemia, which precludes a safe discharge. Discharge to group home, when medically stable likely in 1 to 2 days.  Subjective:  Difficult to receive any history from the patient.  No acute issues overnight.  Patient incontinent of urine.  No nausea no vomiting.  Physical Exam: General:  alert not oriented to time, place, and person.  Appear  in mild distress, affect unresponsive Eyes: PERRL ENT: difficult to assess   Neck: difficult to assess  JVD,  Cardiovascular: S1 and S2 Present, difficult to assess  Murmur,  Respiratory: good respiratory effort, Bilateral Air entry equal and Decreased, no Crackles, no wheezes Abdomen: Bowel Sound present, Soft and no tenderness,  Skin: no rash Extremities: no Pedal edema, difficult to assess  calf tenderness Neurologic: difficult to assess neuro exam due to patient's lack of cooperation.  Spontaneously moving all extremities. Gait not checked due to patient safety concerns  Vitals:   02/26/19 1822 02/26/19 2134 02/26/19 2327 02/27/19 0905  BP: 100/70 (!) 132/97  133/74  Pulse: 95 87 83 89  Resp: 18     Temp: 98.2 F (36.8 C)     TempSrc:      SpO2: 94%  96% 92%  Weight:        Intake/Output Summary (Last 24 hours) at 02/27/2019 2013 Last data filed at 02/27/2019 1900 Gross per 24 hour  Intake 3391.66 ml  Output -  Net 3391.66 ml   Filed Weights   02/26/19 0938  Weight: 72.6 kg    Data Reviewed: I have personally reviewed and interpreted daily labs, tele strips, imagings as discussed above. I reviewed all nursing notes, pharmacy notes, vitals, pertinent old records I have discussed plan of care as described above with RN and patient/family.  CBC: Recent Labs  Lab 02/25/19 2325 02/26/19 1513 02/27/19 0607  WBC 24.4* 21.9* 17.8*  NEUTROABS 19.8*  --   --   HGB 11.4* 10.3* 9.3*  HCT 34.4* 32.1* 29.3*  MCV 87.1 88.7 88.3  PLT 357 301 [id number]   Basic Metabolic Panel: Recent Labs  Lab 02/25/19 2325 02/26/19 1513 02/27/19 0607  NA 136 142 145  K 3.1* 2.9* 2.7*  CL 94* 103 107  CO2 29 28 27   GLUCOSE 241* 164* 98  BUN 16 17 21*  CREATININE 0.76 0.54 0.56  CALCIUM 10.0 8.8* 8.4*    Studies: No results found.  Scheduled Meds: . amLODipine  10 mg Oral QHS  . [START ON 02/28/2019] calcium carbonate  1 tablet Oral Q breakfast  . [START ON 02/28/2019]  cholecalciferol  1,000 Units Oral Q breakfast  . DULoxetine  60 mg Oral Daily  . enoxaparin (LOVENOX) injection  40 mg Subcutaneous Q24H  . ipratropium  2 spray Each Nare TID  . lamoTRIgine  300 mg Oral BID  . letrozole  2.5 mg Oral Daily  . loratadine  10 mg Oral Daily  . LORazepam  1 mg Oral q morning - 10a   And  . LORazepam  0.5 mg Oral BID  . pantoprazole  40 mg Oral Daily  . polyethylene glycol  17 g Oral Daily  . senna-docusate  2 tablet Oral BID   Continuous Infusions: . azithromycin Stopped (02/27/19 1018)  . cefTRIAXone (ROCEPHIN)  IV Stopped (02/27/19 1803)  . lactated ringers 50 mL/hr at 02/27/19 1804  . levETIRAcetam Stopped (02/27/19 1425)  . metronidazole 500 mg (02/27/19 1805)   PRN Meds: acetaminophen **OR** acetaminophen, albuterol, LORazepam, LORazepam, ondansetron **OR** ondansetron (ZOFRAN) IV  Time spent: 35 minutes  Author: Berle Mull, MD [facility] 02/27/2019 8:13 PM  To reach On-call, see care teams to locate the attending and  reach out to them via [email or web address]. If 7PM-7AM, please contact night-coverage If you still have difficulty reaching the attending provider, please page the [facility] (Director on Call) for [facility] on amion for assistance.

## 2019-02-28 DIAGNOSIS — J690 Pneumonitis due to inhalation of food and vomit: Secondary | ICD-10-CM

## 2019-02-28 LAB — CBC WITH DIFFERENTIAL/PLATELET
Abs Immature Granulocytes: 0.07 10*3/uL (ref 0.00–0.07)
Basophils Absolute: 0 10*3/uL (ref 0.0–0.1)
Basophils Relative: 0 %
Eosinophils Absolute: 0 10*3/uL (ref 0.0–0.5)
Eosinophils Relative: 0 %
HCT: 29.4 % — ABNORMAL LOW (ref 36.0–46.0)
Hemoglobin: 9.4 g/dL — ABNORMAL LOW (ref 12.0–15.0)
Immature Granulocytes: 1 %
Lymphocytes Relative: 16 %
Lymphs Abs: 1.9 10*3/uL (ref 0.7–4.0)
MCH: 28.7 pg (ref 26.0–34.0)
MCHC: 32 g/dL (ref 30.0–36.0)
MCV: 89.6 fL (ref 80.0–100.0)
Monocytes Absolute: 1.1 10*3/uL — ABNORMAL HIGH (ref 0.1–1.0)
Monocytes Relative: 9 %
Neutro Abs: 9.1 10*3/uL — ABNORMAL HIGH (ref 1.7–7.7)
Neutrophils Relative %: 74 %
Platelets: 303 10*3/uL (ref 150–400)
RBC: 3.28 MIL/uL — ABNORMAL LOW (ref 3.87–5.11)
RDW: 15.5 % (ref 11.5–15.5)
WBC: 12.3 10*3/uL — ABNORMAL HIGH (ref 4.0–10.5)
nRBC: 0 % (ref 0.0–0.2)

## 2019-02-28 LAB — COMPREHENSIVE METABOLIC PANEL
ALT: 17 U/L (ref 0–44)
AST: 25 U/L (ref 15–41)
Albumin: 3.3 g/dL — ABNORMAL LOW (ref 3.5–5.0)
Alkaline Phosphatase: 47 U/L (ref 38–126)
Anion gap: 8 (ref 5–15)
BUN: 19 mg/dL (ref 6–20)
CO2: 26 mmol/L (ref 22–32)
Calcium: 8.6 mg/dL — ABNORMAL LOW (ref 8.9–10.3)
Chloride: 112 mmol/L — ABNORMAL HIGH (ref 98–111)
Creatinine, Ser: 0.7 mg/dL (ref 0.44–1.00)
GFR calc Af Amer: >60 mL/min (ref >60)
GFR calc non Af Amer: >60 mL/min (ref >60)
Glucose, Bld: 108 mg/dL — ABNORMAL HIGH (ref 70–99)
Potassium: 3.7 mmol/L (ref 3.5–5.1)
Sodium: 146 mmol/L — ABNORMAL HIGH (ref 135–145)
Total Bilirubin: 0.6 mg/dL (ref 0.3–1.2)
Total Protein: 6.2 g/dL — ABNORMAL LOW (ref 6.5–8.1)

## 2019-02-28 LAB — MAGNESIUM: Magnesium: 1.9 mg/dL (ref 1.7–2.4)

## 2019-02-28 LAB — LEGIONELLA PNEUMOPHILA SEROGP 1 UR AG: L. pneumophila Serogp 1 Ur Ag: NEGATIVE

## 2019-02-28 NOTE — Progress Notes (Signed)
[facility] Progress Note  Patient: Helen Patton    [id number]  DOA: 02/25/2019     Date of Service: the patient was seen and examined on 02/28/2019  Chief Complaint  Patient presents with  . Emesis  . Abdominal Pain   Brief hospital course:  Helen Patton is a 59 y.o. 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  Patient is nonverbal and cannot contribute to the story so HPI is per chart review.  She was in her normal state of health until 1/14 evening when she developed abdominal pain along with nausea and vomiting, was brought to the ED are and had 2 or 3 more emesis episodes since.  No reported fever or chills.  I have called the POA/patient's mother and I was unable to reach her.   ED Course:  In the emergency room she had a low-grade temp of 99.4, heart rate in the 90s-100s, normotensive and initially satting 90% on room air and required 2 L nasal cannula to bring sats at 99%.  Blood work shows an elevated white count of 24.4, and a lactic acid in the 2 range.  Her SARS-CoV-2 was negative.  Urinalysis without bacteria, leukocytes or nitrites. She underwent a CT scan of the abdomen and pelvis which showed large volume retained stool in the distal colon and rectal vault, compatible with constipation, along with possible infiltrates bilateral lungs at the bases left greater than right.  CT scan also raised concern for pancreatitis but may be motion artifact.  CT scan also pertinent for bilateral nonobstructive nephrolithiasis, diverticulosis and an incidental adrenal nodule.  Given hypoxia and pneumonia she was given antibiotics and we are asked to admit  Currently further plan is continue to treat constipation as well as IV antibiotics.   Assessment and Plan: Sepsis due to pneumonia, POA -Possibly CAP versus aspiration pneumonia, unclear if her infiltrates appeared after her emesis or before -continue with ceftriaxone, azithromycin along with metronidazole  -Cultures obtained in the ED. SARS-CoV-2 negative.  Strep pneumo and Legionella sent -Speech recommends pured diet as it is difficult to assess the patient. -Continue IV fluids  Active Problems Nausea, vomiting, abdominal pain -?  A degree of ileus from severe constipation -.  Liquid diet, IV fluids, aggressive bowel regimen with oral agents as well as suppository, may need disimpaction/enema if no results  History of breast cancer -Continue home letrozole  Essential hypertension -Resume home amlodipine, hold losartan for now  GERD -Continue PPI  History of seizure disorder Acute seizure in the ER -Continue home Lamictal Patient had a brief generalized seizure lasting 15-20 seconds, stopped on its own.  She is getting scheduled ativan at he group home and possibly she might have missed her dose morning, in addition she has underlying infectious process which may lower her seizure threshold.    Depression -Continue Cymbalta  Cognitive decline/intellectual disability/nonverbal state -Monitor  Hypokalemia, resolved Replaced. Monitor.  Diet: Dysphagia 1 diet DVT Prophylaxis: Subcutaneous Heparin    Advance goals of care discussion: Full code  Family Communication: no family was present at bedside, at the time of interview.   Disposition:  Pt is from group home, admitted with pneumonia, sepsis, seizure, still has hypokalemia, which precludes a safe discharge. Discharge to group home, when medically stable likely in 1 to 2 days.  Subjective:  Pt understood simple commands and gestured for drinks, but could not answer ROS questions.  No fever, N/V/D noted.    Physical Exam: Constitutional: NAD, alert, non-verbal, can nod and shake head,  understood simple commands  HEENT: conjunctivae and lids normal, EOMI CV: RRR no M,R,G. Distal pulses +2.  No cyanosis.  RESP: CTA B/L, normal respiratory effort  GI: +BS, NTND Extremities: No effusions, edema, or tenderness in BLE SKIN: warm, dry and intact Neuro: II - XII grossly intact.  Sensation intact    Vitals:   02/27/19 2344 02/28/19 0000 02/28/19 0823 02/28/19 1548  BP: 132/61  (!) 132/56 133/62  Pulse: 94  90 90  Resp: 16  18 18   Temp: 97.7 F (36.5 C)  98.3 F (36.8 C) 98.3 F (36.8 C)  TempSrc: Oral  Oral Oral  SpO2: (!) 83% 96% 90% 93%  Weight:        Intake/Output Summary (Last 24 hours) at 02/28/2019 1614 Last data filed at 02/28/2019 1332 Gross per 24 hour  Intake 2453.7 ml  Output -  Net 2453.7 ml   Filed Weights   02/26/19 0938  Weight: 72.6 kg    Data Reviewed: I have personally reviewed and interpreted daily labs, tele strips, imagings as discussed above. I reviewed all nursing notes, pharmacy notes, vitals, pertinent old records I have discussed plan of care as described above with RN and patient/family.  CBC: Recent Labs  Lab 02/25/19 2325 02/26/19 1513 02/27/19 0607 02/28/19 0721  WBC 24.4* 21.9* 17.8* 12.3*  NEUTROABS 19.8*  --   --  9.1*  HGB 11.4* 10.3* 9.3* 9.4*  HCT 34.4* 32.1* 29.3* 29.4*  MCV 87.1 88.7 88.3 89.6  PLT 357 301 301 [id number]   Basic Metabolic Panel: Recent Labs  Lab 02/25/19 2325 02/26/19 1513 02/27/19 0607 02/28/19 0721  NA 136 142 145 146*  K 3.1* 2.9* 2.7* 3.7  CL 94* 103 107 112*  CO2 29 28 27 26   GLUCOSE 241* 164* 98 108*  BUN 16 17 21* 19  CREATININE 0.76 0.54 0.56 0.70  CALCIUM 10.0 8.8* 8.4* 8.6*  MG  --   --   --  1.9    Studies: No results found.  Scheduled Meds: . amLODipine  10 mg Oral QHS  . calcium carbonate  1 tablet Oral Q breakfast  . cholecalciferol  1,000 Units Oral Q breakfast  . DULoxetine  60 mg Oral Daily  . enoxaparin (LOVENOX) injection  40 mg Subcutaneous Q24H  .  ipratropium  2 spray Each Nare TID  . lamoTRIgine  300 mg Oral BID  . letrozole  2.5 mg Oral Daily  . loratadine  10 mg Oral Daily  . LORazepam  1 mg Oral q morning - 10a   And  . LORazepam  0.5 mg Oral BID  . pantoprazole  40 mg Oral Daily  . polyethylene glycol  17 g Oral Daily  . senna-docusate  2 tablet Oral BID  . sodium chloride flush  10-40 mL Intracatheter Q12H   Continuous Infusions: . azithromycin 500 mg (02/28/19 0946)  . cefTRIAXone (ROCEPHIN)  IV Stopped (02/27/19 1803)  . lactated ringers 50 mL/hr at 02/28/19 1332  . levETIRAcetam 500 mg (02/28/19 1336)  . metronidazole 500 mg (02/28/19 0826)   PRN Meds: acetaminophen **OR** acetaminophen, albuterol, LORazepam, LORazepam, ondansetron **OR** ondansetron (ZOFRAN) IV, sodium chloride flush   Author: Enzo Bi, MD [facility] 02/28/2019 4:14 PM  To reach On-call, see care teams to locate the  attending and reach out to them via [email or web address]. If 7PM-7AM, please contact night-coverage If you still have difficulty reaching the attending provider, please page the [facility] (Director on Call) for [facility] on amion for assistance.

## 2019-03-01 DIAGNOSIS — R569 Unspecified convulsions: Secondary | ICD-10-CM

## 2019-03-01 DIAGNOSIS — R112 Nausea with vomiting, unspecified: Secondary | ICD-10-CM

## 2019-03-01 LAB — BASIC METABOLIC PANEL
Anion gap: 12 (ref 5–15)
BUN: 8 mg/dL (ref 6–20)
CO2: 27 mmol/L (ref 22–32)
Calcium: 8.8 mg/dL — ABNORMAL LOW (ref 8.9–10.3)
Chloride: 107 mmol/L (ref 98–111)
Creatinine, Ser: 0.61 mg/dL (ref 0.44–1.00)
GFR calc Af Amer: >60 mL/min (ref >60)
GFR calc non Af Amer: >60 mL/min (ref >60)
Glucose, Bld: 104 mg/dL — ABNORMAL HIGH (ref 70–99)
Potassium: 3 mmol/L — ABNORMAL LOW (ref 3.5–5.1)
Sodium: 146 mmol/L — ABNORMAL HIGH (ref 135–145)

## 2019-03-01 LAB — CBC
HCT: 34.8 % — ABNORMAL LOW (ref 36.0–46.0)
Hemoglobin: 11 g/dL — ABNORMAL LOW (ref 12.0–15.0)
MCH: 28.1 pg (ref 26.0–34.0)
MCHC: 31.6 g/dL (ref 30.0–36.0)
MCV: 89 fL (ref 80.0–100.0)
Platelets: 325 10*3/uL (ref 150–400)
RBC: 3.91 MIL/uL (ref 3.87–5.11)
RDW: 15.4 % (ref 11.5–15.5)
WBC: 10.9 10*3/uL — ABNORMAL HIGH (ref 4.0–10.5)
nRBC: 0 % (ref 0.0–0.2)

## 2019-03-01 LAB — MAGNESIUM: Magnesium: 1.8 mg/dL (ref 1.7–2.4)

## 2019-03-01 MED ORDER — AMOXICILLIN-POT CLAVULANATE 875-125 MG PO TABS
1.0000 | ORAL_TABLET | Freq: Two times a day (BID) | ORAL | Status: DC
  Administered 2019-03-01: 11:00:00 1 via ORAL
  Filled 2019-03-01: qty 1

## 2019-03-01 MED ORDER — LEVETIRACETAM 250 MG PO TABS: 250.0000 mg | ORAL_TABLET | Freq: Two times a day (BID) | ORAL | 0 refills | Status: AC

## 2019-03-01 MED ORDER — POTASSIUM CHLORIDE CRYS ER 20 MEQ PO TBCR
40.0000 meq | EXTENDED_RELEASE_TABLET | ORAL | Status: AC
  Administered 2019-03-01: 40 meq via ORAL
  Filled 2019-03-01: qty 2

## 2019-03-01 MED ORDER — AMOXICILLIN-POT CLAVULANATE 875-125 MG PO TABS: 1.0000 | ORAL_TABLET | Freq: Two times a day (BID) | ORAL | 0 refills | Status: AC

## 2019-03-01 MED ORDER — SENNA 8.6 MG PO TABS: 1.0000 | ORAL_TABLET | Freq: Two times a day (BID) | ORAL | 0 refills | Status: AC

## 2019-03-01 MED ORDER — LEVETIRACETAM 500 MG PO TABS: 500.0000 mg | ORAL_TABLET | Freq: Two times a day (BID) | ORAL | 0 refills | Status: DC

## 2019-03-01 MED ORDER — LEVETIRACETAM 500 MG PO TABS
500.0000 mg | ORAL_TABLET | Freq: Two times a day (BID) | ORAL | Status: DC
  Administered 2019-03-01: 500 mg via ORAL
  Filled 2019-03-01 (×2): qty 1

## 2019-03-01 NOTE — TOC Transition Note (Signed)
Transition of Care [facility]) - CM/SW Discharge Note   Patient Details  Name: LAPORTIA DAMERY MRN: [id number] Date of Birth: Dec 09, 1960  Transition of Care [facility] CM/SW Contact:  Su Hilt, RN Phone Number: 03/01/2019, 11:34 AM   Clinical Narrative:     The patient to DC back to [place] today, the bedside nurse to call report to Nurse Rachel Moulds, the patients mother was notified via call, EMS papers on the chart for trasnport  Final next level of care: Other (comment) Barriers to Discharge: Barriers Resolved   Patient Goals and CMS Choice        Discharge Placement                       Discharge Plan and Services                                     Social Determinants of Health (SDOH) Interventions     Readmission Risk Interventions No flowsheet data found.

## 2019-03-01 NOTE — Care Management Important Message (Signed)
Important Message  Patient Details  Name: Helen Patton MRN: [id number] Date of Birth: [birth year]   Medicare Important Message Given:  Yes     Juliann Pulse A Elzia Hott 03/01/2019, 11:54 AM

## 2019-03-01 NOTE — Discharge Summary (Signed)
[facility] Discharge Summary   Patient: Helen Patton [id number]  PCP: Jerrol Banana., MD  Date of admission: 02/25/2019   Date of discharge:  03/01/2019     Discharge Diagnoses:  Principal diagnosis Sepsis due to pneumonia.  Active Problems:   Sepsis ([place]) Seizures Hypokalemia  Admitted From: Home Disposition:  Home group home  Recommendations for Outpatient Follow-up:  1. PCP: Follow-up with PCP in 1 week 2. Follow up LABS/TEST: None  Follow-up Information    Jerrol Banana., MD. Schedule an appointment as soon as possible for a visit in 1 week.   Specialty: Family Medicine Why: Patient to call for Appt Contact information: [address] [place] [address] [phone]        Vladimir Crofts, MD. Schedule an appointment as soon as possible for a visit on 03/29/2019.   Specialty: Neurology Why: @ 2:15 pm Contact information: [facility] [place]  [id number] [phone]          Diet recommendation: Dysphagia type 1 thin liquid  Activity: The patient is advised to gradually reintroduce usual activities, as tolerated  Discharge Condition: stable  Code Status: Full code   History of present illness: As per the H and P dictated on admission, "Helen Patton is a 59 y.o. 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  Patient is nonverbal and cannot contribute to the story so HPI is per chart review.  She was in her normal state of health until 1/14 evening when she developed abdominal pain along with nausea and vomiting, was brought to the ED are and had 2 or 3 more emesis episodes since.  No reported fever or chills.  I have called the  POA/patient's mother and I was unable to reach her.   ED Course:  In the emergency room she had a low-grade temp of 99.4, heart rate in the 90s-100s, normotensive and initially satting 90% on room air and required 2 L nasal cannula to bring sats at 99%.  Blood work shows an elevated white count of 24.4, and a lactic acid in the 2 range.  Her SARS-CoV-2 was negative.  Urinalysis without bacteria, leukocytes or nitrites. She underwent a CT scan of the abdomen and pelvis which showed large volume retained stool in the distal colon and rectal vault, compatible with constipation, along with possible infiltrates bilateral lungs at the bases left greater than right.  CT scan also raised concern for pancreatitis but may be motion artifact.  CT scan also pertinent for bilateral nonobstructive nephrolithiasis, diverticulosis and an incidental adrenal nodule.  Given hypoxia and pneumonia she was given antibiotics and we are asked to admit"  Hospital Course:  Patient presented with complaints of abdominal pain.  Found to have severe constipation.  Had also hypokalemia. Had been witnessed seizure in the ER likely from missing her home benzodiazepine.  Sepsis is resolved.  Symptoms have improved.   Summary of her active problems in the hospital is as following.  Sepsis due to pneumonia, POA -Possibly CAP versus aspiration pneumonia, unclear if her infiltrates appeared after her emesis or before -Cover with ceftriaxone, azithromycin along with metronidazole.  Transition to oral Augmentin -Cultures obtained in the ED so far negative.SARS-CoV-2 negative. Strep pneumo and Legionella negative -Speech recommends pured  diet as it is difficult to assess the patient.  Nausea, vomiting, abdominal pain -? A degree of ileus from severe constipation -.  Liquid diet, IV fluids, aggressive bowel regimen with oral agents as well as suppository, was given in the hospital. We will continue with bowel regimen on  discharge.  History of breast cancer -Continue home letrozole  Essential hypertension -Resume home amlodipine, hold losartan for now  GERD -Continue PPI  History of seizure disorder Acute seizure in the ER -Continue home Lamictal Patient had a brief generalized seizure lasting 15-20 seconds, stopped on its own.  She is getting scheduled ativan at he group home and possibly she might have missed her dose morning, in addition she has underlying infectious process which may lower her seizure threshold. Neurology was consulted. Patient was initially started by ER on IV Keppra but after discussion with neurology patient will be tapered off of the Keppra. Continue home regimen of benzodiazepine as well as Lamictal. Appreciate their assistance. Depression -Continue Cymbalta  Cognitive decline/intellectual disability/nonverbal state -Monitor On dysphagia 1 diet.  Hypokalemia. Replaced. Monitor.  Patient was ambulatory without any assistance. On the day of the discharge the patient's vitals were stable, and no other acute medical condition were reported by patient. the patient was felt safe to be discharge at Home with no therapy needed on discharge.  Consultants: Neurology Procedures: none  Discharge Exam: General: Appear in no distress, no Rash; Oral Mucosa Clear, moist. Cardiovascular: S1 and S2 Present, no Murmur, Respiratory: normal respiratory effort, Bilateral Air entry present and no Crackles, no wheezes Abdomen: Bowel Sound present, Soft and no tenderness, no hernia Extremities: no Pedal edema, no calf tenderness Neurology: alert and nonverbal at baseline.  Not oriented. affect appropriate.  Filed Weights   02/26/19 0938 03/01/19 1141  Weight: 72.6 kg 72.5 kg   Vitals:   03/01/19 0007 03/01/19 1141  BP: (!) 147/63 (!) 147/63  Pulse: 96 96  Resp: 14 14  Temp: 98.2 F (36.8 C) 98.2 F (36.8 C)  SpO2: 92%     DISCHARGE MEDICATION: Allergies as of  03/01/2019      Reactions   Sulfa Antibiotics Other (See Comments)   "Does not know"   Keflex [cephalexin] Other (See Comments)   "Does not know" unknown      Medication List    STOP taking these medications   clindamycin 1 % external solution Commonly known as: CLEOCIN T   clindamycin 1 % lotion Commonly known as: CLEOCIN T   Vitamin D 50 MCG (2000 UT) tablet     TAKE these medications   acetaminophen 325 MG tablet Commonly known as: TYLENOL Take 2 tablets (650 mg total) by mouth every 6 (six) hours as needed.   alendronate 70 MG tablet Commonly known as: FOSAMAX TAKE 1 TABLET  BY MOUTH WEEKLY EARLY MORNING BEFORE FOOD/MEDS WITH WATER. DO NOT LIE DOWN FOR 30 MINUTES   amLODipine 10 MG tablet Commonly known as: NORVASC TAKE 1 TABLET BY MOUTH AT BEDTIME   amoxicillin-clavulanate 875-125 MG tablet Commonly known as: AUGMENTIN Take 1 tablet by mouth 2 (two) times daily for 4 days.   benzoyl peroxide 5 % external liquid Generic drug: benzoyl peroxide USE TOPICALLY ONCE DAILY   calcium-vitamin D 500-200 MG-UNIT Tabs tablet Commonly known as: OSCAL WITH D TAKE 1 TABLET BY MOUTH EVERY DAY WITH BREAKFAST   cetirizine 10 MG tablet Commonly known as: ZYRTEC Take 1 tablet (10 mg total) by mouth daily.   clotrimazole 1 % cream  Commonly known as: LOTRIMIN APPLY TOPICALLY TWICE A DAY AS NEEDED FOR RASH IN SKIN FOLDS   coal tar 0.5 % shampoo Commonly known as: NEUTROGENA T-GEL Use as directed   cyanocobalamin 1000 MCG/ML injection Commonly known as: (VITAMIN B-12) GIVE 1000 MCG INJECTION "IM" ONCE A MONTH (B-12 SUPPLEMENT)   diphenhydrAMINE 12.5 MG chewable tablet Commonly known as: BENADRYL Chew 1 tablet (12.5 mg total) by mouth 3 (three) times daily.   docusate sodium 100 MG capsule Commonly known as: COLACE TAKE 1 CAPSULE BY MOUTH 2 TIMES PER DAY   DULoxetine 60 MG capsule Commonly known as: CYMBALTA Take 1 capsule (60 mg total) by mouth daily. What  changed: Another medication with the same name was removed. Continue taking this medication, and follow the directions you see here.   ipratropium 0.03 % nasal spray Commonly known as: ATROVENT Place 2 sprays into both nostrils 3 (three) times daily.   lamoTRIgine 150 MG tablet Commonly known as: LAMICTAL TAKE 2 TABLETS BY MOUTH 2 TIMES PER DAY   letrozole 2.5 MG tablet Commonly known as: FEMARA TAKE 1 TABLET BY MOUTH EVERY DAY   levETIRAcetam 250 MG tablet Commonly known as: KEPPRA Take 1 tablet (250 mg total) by mouth 2 (two) times daily for 2 days. Then stop.   LORazepam 1 MG tablet Commonly known as: ATIVAN Take 1 tablet by mouth every morning, and 1/2 tablet at noon and bedtime.   losartan 100 MG tablet Commonly known as: COZAAR Take 1 tablet (100 mg total) by mouth daily.   pantoprazole 40 MG tablet Commonly known as: PROTONIX TAKE 1 TABLET BY MOUTH 2 TIMES PER DAY **DO NOT CRUSH**   senna 8.6 MG Tabs tablet Commonly known as: SENOKOT Take 1 tablet (8.6 mg total) by mouth 2 (two) times daily.   terbinafine 1 % cream Commonly known as: LAMISIL Use 2 times a week as needed   triamcinolone 0.025 % ointment Commonly known as: KENALOG Apply 1 application topically 2 (two) times daily as needed.      Allergies  Allergen Reactions  . Sulfa Antibiotics Other (See Comments)    "Does not know"  . Keflex [Cephalexin] Other (See Comments)    "Does not know" unknown   Discharge Instructions    DIET - DYS 1   Complete by: As directed    Fluid consistency: Thin   Increase activity slowly   Complete by: As directed       The results of significant diagnostics from this hospitalization (including imaging, microbiology, ancillary and laboratory) are listed below for reference.    Significant Diagnostic Studies: CT ABDOMEN PELVIS W CONTRAST  Result Date: 02/26/2019 CLINICAL DATA:  Initial evaluation for acute abdominal distension, nausea, vomiting. EXAM: CT  ABDOMEN AND PELVIS WITH CONTRAST TECHNIQUE: Multidetector CT imaging of the abdomen and pelvis was performed using the standard protocol following bolus administration of intravenous contrast. CONTRAST:  150mL OMNIPAQUE IOHEXOL 300 MG/ML  SOLN COMPARISON:  Prior CT from 12/07/2011. FINDINGS: Lower chest: Patchy consolidative opacity seen within the left greater than right lung bases, somewhat suspicious for infiltrates, particularly at the left lung base where a few small air bronchograms are visible. Partially visualized lung bases are otherwise clear. Hepatobiliary: Liver demonstrates a normal contrast enhanced appearance. Focal fat deposition noted adjacent to the falciform ligament. Gallbladder within normal limits. Pancreas: Apparent ill definition of the pancreas with question of peripancreatic stranding, favored to be related to motion artifact. Possible degree of mild acute pancreatitis somewhat difficult to exclude.  Pancreas otherwise unremarkable. Spleen: Spleen within normal limits. Adrenals/Urinary Tract: 1.3 cm nodule present at the right adrenal gland, indeterminate. Left adrenal gland unremarkable. Kidneys equal in size with symmetric enhancement. 4.9 cm simple cyst present at the interpolar right kidney. Additional 2.2 cm cyst present at the interpolar left kidney. Multiple additional scattered subcentimeter hypodensities too small the characterize, but statistically likely reflect small cysts as well. Probable few scattered nonobstructive calculi present within the kidneys bilaterally, largest of which seen at the upper pole on the left and measures 3 mm. No radiopaque calculi seen along the course of either renal collecting system. No hydronephrosis or hydroureter. No focal enhancing renal mass. Partially distended bladder grossly unremarkable, although evaluation limited by streak artifact from metallic hardware about the hips bilaterally. Stomach/Bowel: Moderate hiatal hernia. Stomach otherwise  unremarkable. No evidence for bowel obstruction. Normal appendix. Mild colonic diverticulosis without evidence for acute diverticulitis. No acute inflammatory changes seen about the bowels. Large volume retained stool seen impacted within the distal colon and rectal vault, compatible with constipation. Vascular/Lymphatic: Normal intravascular enhancement seen throughout the intra-abdominal aorta. Moderate aorto bi-iliac atherosclerotic disease. No aneurysm. Mesenteric vessels patent proximally. No adenopathy. Reproductive: Uterus and right ovary not well visualized due to streak artifact. Normal left ovary. No adnexal mass. Other: No free air or fluid. Musculoskeletal: No acute osseous abnormality. No discrete or worrisome osseous lesions. Right total hip arthroplasty in place. Fixation screws in place at the left hip. Few small calcifications noted within the inferior left breast, of doubtful significance. External soft tissues otherwise unremarkable. IMPRESSION: 1. Large volume retained stool impacted within the distal colon and rectal vault, compatible with constipation. 2. Patchy consolidative opacity within the left greater than right lung bases, suspicious for possible infiltrates. 3. Apparent ill definition of the pancreas with question of peri-pancreatic stranding, favored to be related to motion artifact. Possible degree of acute pancreatitis somewhat difficult to exclude. Correlation with serum lipase recommended. 4. Bilateral nonobstructive nephrolithiasis. 5. Colonic diverticulosis without evidence for acute diverticulitis. 6. Moderate hiatal hernia. 7. 1.3 cm right adrenal nodule, indeterminate. Further evaluation with dedicated adrenal mass protocol CT recommended. This could be performed on an outpatient nonemergent basis. Electronically Signed   By: Jeannine Boga M.D.   On: 02/26/2019 01:25   DG Abdomen Acute W/Chest  Result Date: 02/25/2019 CLINICAL DATA:  Vomiting, abdominal pain EXAM:  DG ABDOMEN ACUTE W/ 1V CHEST COMPARISON:  10/07/2011 FINDINGS: Heart is normal size. Lungs clear. No effusions. Small hiatal hernia. There is normal bowel gas pattern. No free air. No organomegaly or suspicious calcification. No acute bony abnormality. IMPRESSION: No evidence of bowel obstruction or free air. No acute cardiopulmonary disease. Electronically Signed   By: Rolm Baptise M.D.   On: 02/25/2019 23:02    Microbiology: Recent Results (from the past 240 hour(s))  Blood Culture (routine x 2)     Status: None (Preliminary result)   Collection Time: 02/26/19  1:49 AM   Specimen: BLOOD  Result Value Ref Range Status   Specimen Description BLOOD BLOOD LEFT FOREARM  Final   Special Requests   Final    BOTTLES DRAWN AEROBIC AND ANAEROBIC Blood Culture adequate volume   Culture   Final    NO GROWTH 3 DAYS Performed at [facility], [address], [place], [place] [address]    Report Status PENDING  Incomplete  Blood Culture (routine x 2)     Status: None (Preliminary result)   Collection Time: 02/26/19  1:49 AM  Specimen: BLOOD  Result Value Ref Range Status   Specimen Description BLOOD RIGHT ANTECUBITAL  Final   Special Requests   Final    BOTTLES DRAWN AEROBIC AND ANAEROBIC Blood Culture adequate volume   Culture   Final    NO GROWTH 3 DAYS Performed at [facility], [address], [place], [place] [address]    Report Status PENDING  Incomplete  Respiratory Panel by RT PCR (Flu A&B, Covid) - Nasopharyngeal Swab     Status: None   Collection Time: 02/26/19  2:24 AM   Specimen: Nasopharyngeal Swab  Result Value Ref Range Status   SARS Coronavirus 2 by RT PCR NEGATIVE NEGATIVE Final    Comment: (NOTE) SARS-CoV-2 target nucleic acids are NOT DETECTED. The SARS-CoV-2 RNA is generally detectable in upper respiratoy specimens during the acute phase of infection. The lowest concentration of SARS-CoV-2 viral copies this assay can detect is 131 copies/mL. A  negative result does not preclude SARS-Cov-2 infection and should not be used as the sole basis for treatment or other patient management decisions. A negative result may occur with  improper specimen collection/handling, submission of specimen other than nasopharyngeal swab, presence of viral mutation(s) within the areas targeted by this assay, and inadequate number of viral copies (<131 copies/mL). A negative result must be combined with clinical observations, patient history, and epidemiological information. The expected result is Negative. Fact Sheet for Patients:  PinkCheek.be Fact Sheet for Healthcare Providers:  GravelBags.it This test is not yet ap proved or cleared by the [place] FDA and  has been authorized for detection and/or diagnosis of SARS-CoV-2 by FDA under an Emergency Use Authorization (EUA). This EUA will remain  in effect (meaning this test can be used) for the duration of the COVID-19 declaration under Section 564(b)(1) of the Act, 21 U.S.C. section 360bbb-3(b)(1), unless the authorization is terminated or revoked sooner.    Influenza A by PCR NEGATIVE NEGATIVE Final   Influenza B by PCR NEGATIVE NEGATIVE Final    Comment: (NOTE) The Xpert Xpress SARS-CoV-2/FLU/RSV assay is intended as an aid in  the diagnosis of influenza from Nasopharyngeal swab specimens and  should not be used as a sole basis for treatment. Nasal washings and  aspirates are unacceptable for Xpert Xpress SARS-CoV-2/FLU/RSV  testing. Fact Sheet for Patients: PinkCheek.be Fact Sheet for Healthcare Providers: GravelBags.it This test is not yet approved or cleared by the [place] FDA and  has been authorized for detection and/or diagnosis of SARS-CoV-2 by  FDA under an Emergency Use Authorization (EUA). This EUA will remain  in effect (meaning this test can be used) for  the duration of the  Covid-19 declaration under Section 564(b)(1) of the Act, 21  U.S.C. section 360bbb-3(b)(1), unless the authorization is  terminated or revoked. Performed at [facility], [address], [place], [place] [address]   Urine culture     Status: None   Collection Time: 02/26/19  3:31 AM   Specimen: In/Out Cath Urine  Result Value Ref Range Status   Specimen Description   Final    IN/OUT CATH URINE Performed at [facility], [address] [place], [place] [address]    Special Requests   Final    NONE Performed at [facility], [address], [place], [place] [address]    Culture   Final    NO GROWTH Performed at [facility], [place] [address], [place], [place] [address]    Report Status 02/27/2019 FINAL  Final     Labs: CBC: Recent Labs  Lab 02/25/19 2325 02/26/19 1513 02/27/19 0607 02/28/19 0721 03/01/19 0521  WBC 24.4* 21.9* 17.8* 12.3* 10.9*  NEUTROABS 19.8*  --   --  9.1*  --   HGB 11.4* 10.3* 9.3* 9.4* 11.0*  HCT 34.4* 32.1* 29.3* 29.4* 34.8*  MCV 87.1 88.7 88.3 89.6 89.0  PLT 357 301 301 303 [id number]   Basic Metabolic Panel: Recent Labs  Lab 02/25/19 2325 02/26/19 1513 02/27/19 0607 02/28/19 0721 03/01/19 0521  NA 136 142 145 146* 146*  K 3.1* 2.9* 2.7* 3.7 3.0*  CL 94* 103 107 112* 107  CO2 29 28 27 26 27   GLUCOSE 241* 164* 98 108* 104*  BUN 16 17 21* 19 8  CREATININE 0.76 0.54 0.56 0.70 0.61  CALCIUM 10.0 8.8* 8.4* 8.6* 8.8*  MG  --   --   --  1.9 1.8   Liver Function Tests: Recent Labs  Lab 02/25/19 2325 02/27/19 0607 02/28/19 0721  AST 26 24 25   ALT 15 15 17   ALKPHOS 76 58 47  BILITOT 0.6 0.6 0.6  PROT 7.9 6.8 6.2*  ALBUMIN 4.4 3.6 3.3*   Recent Labs  Lab 02/25/19 2325  LIPASE 33   No results for input(s): AMMONIA in the last 168 hours. Cardiac Enzymes: No results for input(s): CKTOTAL, CKMB, CKMBINDEX, TROPONINI in the last 168 hours. BNP (last 3 results) No results for  input(s): BNP in the last 8760 hours. CBG: No results for input(s): GLUCAP in the last 168 hours.  Time spent: 35 minutes  Signed:  Berle Mull  [facility]  03/01/2019 12:08 PM

## 2019-03-01 NOTE — NC FL2 (Signed)
[place] LEVEL OF CARE SCREENING TOOL     IDENTIFICATION  Patient Name: Helen Patton Birthdate: 04-08-1960 Sex: female Admission Date (Current Location): 02/25/2019  [facility] and [place] Number:  Engineering geologist and Address:  [facility], [address] [place], [place] [address]      Provider Number: [id number]  Attending Physician Name and Address:  Lavina Hamman, MD  Relative Name and Phone Number:  Nayelly Mother    Current Level of Care: Hospital Recommended Level of Care: Other (Comment)([facility]) Prior Approval Number:    Date Approved/Denied:   PASRR Number:    Discharge Plan:      Current Diagnoses: Patient Active Problem List   Diagnosis Date Noted  . Sepsis ([place]) 02/26/2019  . Postmenopausal bleeding 04/22/2018  . B12 deficiency 05/17/2016  . S/P total hip arthroplasty 08/23/2015  . Acute respiratory failure with hypoxia ([place]) 05/19/2015  . Avascular necrosis of femoral head ([place]) 03/07/2015  . Closed fracture of neck of right femur ([place]) 03/07/2015  . Anxiety 12/14/2014  . Arthralgia of hip 12/14/2014  . Athlete's foot 12/14/2014  . Cerebral palsy ([place]) 12/14/2014  . Cognitive decline 12/14/2014  . Clinical depression 12/14/2014  . Acid reflux 12/14/2014  . HLD (hyperlipidemia) 12/14/2014  . BP (high blood pressure) 12/14/2014  . Methicillin resistant Staphylococcus aureus infection 12/14/2014  . Adiposity 12/14/2014  . Breast cancer ([place]) 11/21/2013  . Intellectual disability 11/16/2013  . Seizure ([place]) 09/30/2013    Orientation RESPIRATION BLADDER Height & Weight     Self, Time, Situation, Place  Normal Incontinent Weight: 72.5 kg Height:  5' 7.01" (170.2 cm)  BEHAVIORAL SYMPTOMS/MOOD NEUROLOGICAL BOWEL NUTRITION STATUS      Incontinent Diet(heart healthy)  AMBULATORY STATUS COMMUNICATION OF NEEDS Skin   Limited Assist   Normal                       Personal Care  Assistance Level of Assistance              Functional Limitations Info             SPECIAL CARE FACTORS FREQUENCY                       Contractures Contractures Info: Not present    Additional Factors Info  Code Status, Allergies Code Status Info: full code Allergies Info: Sulfa, Keflex           Current Medications (03/01/2019):  This is the current hospital active medication list Current Facility-Administered Medications  Medication Dose Route Frequency Provider Last Rate Last Admin  . acetaminophen (TYLENOL) tablet 650 mg  650 mg Oral Q6H PRN Caren Griffins, MD   650 mg at 02/27/19 1156   Or  . acetaminophen (TYLENOL) suppository 650 mg  650 mg Rectal Q6H PRN Caren Griffins, MD      . albuterol (PROVENTIL) (2.5 MG/3ML) 0.083% nebulizer solution 3 mL  3 mL Inhalation Q6H PRN Caren Griffins, MD      . amLODipine (NORVASC) tablet 10 mg  10 mg Oral QHS Caren Griffins, MD   10 mg at 02/28/19 2056  . amoxicillin-clavulanate (AUGMENTIN) 875-125 MG per tablet 1 tablet  1 tablet Oral Q12H Lavina Hamman, MD   1 tablet at 03/01/19 1051  . calcium carbonate (OS-CAL - dosed in mg of elemental calcium) tablet 500 mg of elemental calcium  1  tablet Oral Q breakfast Lavina Hamman, MD   500 mg of elemental calcium at 03/01/19 0827  . cholecalciferol (VITAMIN D3) tablet 1,000 Units  1,000 Units Oral Q breakfast Lavina Hamman, MD   1,000 Units at 03/01/19 0827  . DULoxetine (CYMBALTA) DR capsule 60 mg  60 mg Oral Daily Caren Griffins, MD   60 mg at 03/01/19 0827  . enoxaparin (LOVENOX) injection 40 mg  40 mg Subcutaneous Q24H Caren Griffins, MD   40 mg at 03/01/19 0827  . ipratropium (ATROVENT) 0.03 % nasal spray 2 spray  2 spray Each Nare TID Caren Griffins, MD   2 spray at 03/01/19 1052  . lamoTRIgine (LAMICTAL) tablet 300 mg  300 mg Oral BID Oswald Hillock, RPH   300 mg at 03/01/19 1053  . letrozole [facility]) tablet 2.5 mg  2.5 mg Oral Daily Caren Griffins, MD   2.5 mg at 03/01/19 0827  . levETIRAcetam (KEPPRA) tablet 500 mg  500 mg Oral BID Lavina Hamman, MD   500 mg at 03/01/19 1051  . loratadine (CLARITIN) tablet 10 mg  10 mg Oral Daily Caren Griffins, MD   10 mg at 03/01/19 0827  . LORazepam (ATIVAN) injection 2 mg  2 mg Intravenous PRN Sharion Settler, NP      . LORazepam (ATIVAN) tablet 0.5 mg  0.5 mg Oral Q6H PRN Caren Griffins, MD      . LORazepam (ATIVAN) tablet 1 mg  1 mg Oral q morning - 10a Caren Griffins, MD   1 mg at 03/01/19 [id number]   And  . LORazepam (ATIVAN) tablet 0.5 mg  0.5 mg Oral BID Caren Griffins, MD   0.5 mg at 02/28/19 1940  . ondansetron (ZOFRAN) tablet 4 mg  4 mg Oral Q6H PRN Caren Griffins, MD       Or  . ondansetron (ZOFRAN) injection 4 mg  4 mg Intravenous Q6H PRN Caren Griffins, MD      . pantoprazole (PROTONIX) EC tablet 40 mg  40 mg Oral Daily Caren Griffins, MD   40 mg at 03/01/19 0827  . polyethylene glycol (MIRALAX / GLYCOLAX) packet 17 g  17 g Oral Daily Caren Griffins, MD   17 g at 02/28/19 0827  . senna-docusate (Senokot-S) tablet 2 tablet  2 tablet Oral BID Caren Griffins, MD   2 tablet at 03/01/19 0826  . sodium chloride flush (NS) 0.9 % injection 10-40 mL  10-40 mL Intracatheter Q12H Lavina Hamman, MD   10 mL at 02/28/19 0829  . sodium chloride flush (NS) 0.9 % injection 10-40 mL  10-40 mL Intracatheter PRN Lavina Hamman, MD         Discharge Medications: Please see discharge summary for a list of discharge medications.  Relevant Imaging Results:  Relevant Lab Results:   Additional Information    Su Hilt, RN

## 2019-03-01 NOTE — Consult Note (Signed)
Reason for Consult:Seizure Referring Physician: Posey Pronto  CC: Abdominal pain. vomiting  HPI: Helen Patton is an 59 y.o. female who is nonverbal therefore all history obtained from the chart.  Patient with a history of breast cancer status post lumpectomy, HTN, HLD, mental retardation nonverbal at baseline, recurrent aspiration pneumonia, seizure disorder, who came to the hospital on 1/14 brought by family for abdominal pain and vomiting with brown-green emesis.  Patient noted in the ED to have a GTC seizure on 1/15.  Due to GI symptoms unclear how many doses of Ativan and Lamictal were not kept down/missed.    Past Medical History:  Diagnosis Date  . Anxiety   . Arthritis    osteoarthritis  . Breast cancer ([place]) 2015   [name]  . Cancer ([place]) 11/2013   T1c, Nx; ER/ PR +; Her 2 neg invasive mammary carcinoma left breast. Wide excision only based on Tumor Board review.   . Cataract   . Depression   . Difficulty swallowing   . Falls   . GERD (gastroesophageal reflux disease)   . Hidradenitis 2012   MRSA, on chronic topical suppression  . History of hiatal hernia   . Hyperlipidemia   . Hypertension   . Mental retardation   . MRSA (methicillin resistant staph aureus) culture positive 2012  . Osteoporosis   . Pneumonia    aspiration pneumonia  . Seizures [facility])    last seizure February 2017    Past Surgical History:  Procedure Laterality Date  . BREAST LUMPECTOMY Left 12/07/2013   T1b, NX, ER/PR positive, HER-2/neu not overexpressed, 7 mm.  Not candidate for chemotherapy/sentinel node/radiation based on mental retardation.  [facility] BREAST SURGERY Left 12/07/2013   T1b, Nx; ER+, PR+, her 2 not amplified. (Not candidate for chemotherapy/radiation based on mental retardation)  . broken leg    . CERVICAL ABLATION    . 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  dislocated hip    . HARDWARE REMOVAL  08/23/2015   Procedure: HARDWARE REMOVAL;  Surgeon: Dereck Leep, MD;  Location: [facility] ORS;  Service: Orthopedics;;  . TOTAL HIP REVISION Right 08/23/2015   Procedure: TOTAL HIP REVISION;  Surgeon: Dereck Leep, MD;  Location: [facility] ORS;  Service: Orthopedics;  Laterality: Right;    Family History  Problem Relation Age of Onset  . Diabetes Father   . Parkinson's disease Father   . Neuropathy Mother   . Arthritis Mother   . Diabetes Mother   . Autoimmune disease Brother        AIDS  . Cancer Maternal Aunt 63       breast  . Breast cancer Maternal Aunt   . Hypertension Maternal Grandmother   . Cancer Paternal Grandmother     Social History:  reports that she has never smoked. She has never used smokeless tobacco. She reports that she does not drink alcohol or use drugs.  Allergies  Allergen Reactions  . Sulfa Antibiotics Other (See Comments)    "Does not know"  . Keflex [Cephalexin] Other (See Comments)    "Does not know" unknown    Medications:  I have reviewed the patient's current medications. Prior to Admission:  Medications Prior to Admission  Medication Sig Dispense Refill Last Dose  . acetaminophen (TYLENOL) 325 MG tablet Take 2 tablets (650 mg total) by mouth every 6 (six) hours  as needed. 100 tablet 11 prn at prn  . alendronate (FOSAMAX) 70 MG tablet TAKE 1 TABLET  BY MOUTH WEEKLY EARLY MORNING BEFORE FOOD/MEDS WITH WATER. DO NOT LIE DOWN FOR 30 MINUTES 4 tablet 12 Past Week at Unknown time  . amLODipine (NORVASC) 10 MG tablet TAKE 1 TABLET BY MOUTH AT BEDTIME 30 tablet 12 02/24/2019 at Unknown time  . BENZOYL PEROXIDE 5 % external wash USE TOPICALLY ONCE DAILY 150 g 3 02/25/2019 at Unknown time  . calcium-vitamin D (OSCAL WITH D) 500-200 MG-UNIT TABS tablet TAKE 1 TABLET BY MOUTH EVERY DAY WITH BREAKFAST 30 tablet 11 02/24/2019 at Unknown time  . cetirizine (ZYRTEC) 10 MG tablet Take 1 tablet (10 mg total) by mouth daily. 90  tablet 3 02/25/2019 at Unknown time  . Cholecalciferol (VITAMIN D) 50 MCG (2000 UT) tablet TAKE 1 CAPSULE BY MOUTH EVERY DAY 30 tablet 12 02/25/2019 at Unknown time  . cyanocobalamin (,VITAMIN B-12,) 1000 MCG/ML injection GIVE 1000 MCG INJECTION "IM" ONCE A MONTH (B-12 SUPPLEMENT) 1 mL 11 Past Week at Unknown time  . diphenhydrAMINE (BENADRYL) 12.5 MG chewable tablet Chew 1 tablet (12.5 mg total) by mouth 3 (three) times daily. 90 tablet 11 02/25/2019 at Unknown time  . docusate sodium (COLACE) 100 MG capsule TAKE 1 CAPSULE BY MOUTH 2 TIMES PER DAY 60 capsule 12 02/25/2019 at Unknown time  . DULoxetine (CYMBALTA) 30 MG capsule Take 1 capsule (30 mg total) by mouth daily. 30 capsule 11 02/24/2019 at Unknown time  . DULoxetine (CYMBALTA) 60 MG capsule Take 1 capsule (60 mg total) by mouth daily. 30 capsule 11 02/24/2019 at Unknown time  . ipratropium (ATROVENT) 0.03 % nasal spray Place 2 sprays into both nostrils 3 (three) times daily. 30 mL 12 02/25/2019 at Unknown time  . lamoTRIgine (LAMICTAL) 150 MG tablet TAKE 2 TABLETS BY MOUTH 2 TIMES PER DAY 120 tablet 5 02/25/2019 at Unknown time  . letrozole (FEMARA) 2.5 MG tablet TAKE 1 TABLET BY MOUTH EVERY DAY 30 tablet 12 02/24/2019 at Unknown time  . LORazepam (ATIVAN) 1 MG tablet Take 1 tablet by mouth every morning, and 1/2 tablet at noon and bedtime. 90 tablet 3 02/25/2019 at Unknown time  . losartan (COZAAR) 100 MG tablet Take 1 tablet (100 mg total) by mouth daily. 30 tablet 11 02/24/2019 at Unknown time  . pantoprazole (PROTONIX) 40 MG tablet TAKE 1 TABLET BY MOUTH 2 TIMES PER DAY **DO NOT CRUSH** 60 tablet 11 02/25/2019 at Unknown time  . terbinafine (LAMISIL) 1 % cream Use 2 times a week as needed 30 g 2 prn at prn  . triamcinolone (KENALOG) 0.025 % ointment Apply 1 application topically 2 (two) times daily as needed.   prn at prn  . clindamycin (CLEOCIN T) 1 % external solution Apply topically daily. (Patient not taking: Reported on 02/25/2019) 60 mL 12 Not  Taking at Unknown time  . clindamycin (CLEOCIN T) 1 % lotion APPLY TOPICALLY ONCE DAILY (Patient not taking: Reported on 02/25/2019) 60 mL 5 Not Taking at Unknown time  . clotrimazole (LOTRIMIN) 1 % cream APPLY TOPICALLY TWICE A DAY AS NEEDED FOR RASH IN SKIN FOLDS (Patient not taking: Reported on 02/25/2019) 30 g 0 Not Taking at Unknown time  . coal tar (NEUTROGENA T-GEL) 0.5 % shampoo Use as directed (Patient not taking: Reported on 02/25/2019) 30 mL 11 Not Taking at Unknown time   Scheduled: . amLODipine  10 mg Oral QHS  . amoxicillin-clavulanate  1 tablet Oral Q12H  .  calcium carbonate  1 tablet Oral Q breakfast  . cholecalciferol  1,000 Units Oral Q breakfast  . DULoxetine  60 mg Oral Daily  . enoxaparin (LOVENOX) injection  40 mg Subcutaneous Q24H  . ipratropium  2 spray Each Nare TID  . lamoTRIgine  300 mg Oral BID  . letrozole  2.5 mg Oral Daily  . levETIRAcetam  500 mg Oral BID  . loratadine  10 mg Oral Daily  . LORazepam  1 mg Oral q morning - 10a   And  . LORazepam  0.5 mg Oral BID  . pantoprazole  40 mg Oral Daily  . polyethylene glycol  17 g Oral Daily  . senna-docusate  2 tablet Oral BID  . sodium chloride flush  10-40 mL Intracatheter Q12H    ROS: Unable to provide due to being nonverbal  Physical Examination: Blood pressure (!) 147/63, pulse 96, temperature 98.2 F (36.8 C), temperature source Oral, resp. rate 14, weight 72.6 kg, SpO2 92 %.  HEENT-  Normocephalic, no lesions, without obvious abnormality.  Normal external eye and conjunctiva.  Normal TM's bilaterally.  Normal auditory canals and external ears. Normal external nose, mucus membranes and septum.  Normal pharynx. Cardiovascular- S1, S2 normal, pulses palpable throughout   Lungs- chest clear, no wheezing, rales, normal symmetric air entry Abdomen- soft, non-tender; bowel sounds normal; no masses,  no organomegaly Extremities- no edema Lymph-no adenopathy palpable Musculoskeletal-no joint tenderness,  deformity or swelling Skin-warm and dry, no hyperpigmentation, vitiligo, or suspicious lesions  Neurological Examination   Mental Status: Alert.  Nods head in response to questioning.  Able to follow most simple commands.   Cranial Nerves: II: Blinks to bilateral confrontation III,IV, VI: ptosis not present, extra-ocular motions intact bilaterally V,VII: face symmetric, facial light touch sensation normal bilaterally VIII: hearing normal bilaterally IX,X: would not cooperate for testing XI: bilateral shoulder shrug XII: would not cooperate for testing Motor: Able to lift all extremities against gravity and maintain Sensory: Pinprick and light touch intact throughout, bilaterally Deep Tendon Reflexes: Symmetric throughout Plantars: Right: mute   Left: mute Cerebellar: No dysmetria noted in the upper extremities Gait: not tested due to safety concerns   Laboratory Studies:   Basic Metabolic Panel: Recent Labs  Lab 02/25/19 2325 02/25/19 2325 02/26/19 1513 02/26/19 1513 02/27/19 0607 02/28/19 0721 03/01/19 0521  NA 136  --  142  --  145 146* 146*  K 3.1*  --  2.9*  --  2.7* 3.7 3.0*  CL 94*  --  103  --  107 112* 107  CO2 29  --  28  --  27 26 27   GLUCOSE 241*  --  164*  --  98 108* 104*  BUN 16  --  17  --  21* 19 8  CREATININE 0.76  --  0.54  --  0.56 0.70 0.61  CALCIUM 10.0   < > 8.8*   < > 8.4* 8.6* 8.8*  MG  --   --   --   --   --  1.9 1.8   < > = values in this interval not displayed.    Liver Function Tests: Recent Labs  Lab 02/25/19 2325 02/27/19 0607 02/28/19 0721  AST 26 24 25   ALT 15 15 17   ALKPHOS 76 58 47  BILITOT 0.6 0.6 0.6  PROT 7.9 6.8 6.2*  ALBUMIN 4.4 3.6 3.3*   Recent Labs  Lab 02/25/19 2325  LIPASE 33   No results for input(s): AMMONIA in  the last 168 hours.  CBC: Recent Labs  Lab 02/25/19 2325 02/26/19 1513 02/27/19 0607 02/28/19 0721 03/01/19 0521  WBC 24.4* 21.9* 17.8* 12.3* 10.9*  NEUTROABS 19.8*  --   --  9.1*  --    HGB 11.4* 10.3* 9.3* 9.4* 11.0*  HCT 34.4* 32.1* 29.3* 29.4* 34.8*  MCV 87.1 88.7 88.3 89.6 89.0  PLT 357 301 301 303 325    Cardiac Enzymes: No results for input(s): CKTOTAL, CKMB, CKMBINDEX, TROPONINI in the last 168 hours.  BNP: Invalid input(s): POCBNP  CBG: No results for input(s): GLUCAP in the last 168 hours.  Microbiology: Results for orders placed or performed during the hospital encounter of 02/25/19  Blood Culture (routine x 2)     Status: None (Preliminary result)   Collection Time: 02/26/19  1:49 AM   Specimen: BLOOD  Result Value Ref Range Status   Specimen Description BLOOD BLOOD LEFT FOREARM  Final   Special Requests   Final    BOTTLES DRAWN AEROBIC AND ANAEROBIC Blood Culture adequate volume   Culture   Final    NO GROWTH 3 DAYS Performed at [facility], [address], [place], [place] [address]    Report Status PENDING  Incomplete  Blood Culture (routine x 2)     Status: None (Preliminary result)   Collection Time: 02/26/19  1:49 AM   Specimen: BLOOD  Result Value Ref Range Status   Specimen Description BLOOD RIGHT ANTECUBITAL  Final   Special Requests   Final    BOTTLES DRAWN AEROBIC AND ANAEROBIC Blood Culture adequate volume   Culture   Final    NO GROWTH 3 DAYS Performed at [facility], [address] [place], [place] [address]    Report Status PENDING  Incomplete  Respiratory Panel by RT PCR (Flu A&B, Covid) - Nasopharyngeal Swab     Status: None   Collection Time: 02/26/19  2:24 AM   Specimen: Nasopharyngeal Swab  Result Value Ref Range Status   SARS Coronavirus 2 by RT PCR NEGATIVE NEGATIVE Final    Comment: (NOTE) SARS-CoV-2 target nucleic acids are NOT DETECTED. The SARS-CoV-2 RNA is generally detectable in upper respiratoy specimens during the acute phase of infection. The lowest concentration of SARS-CoV-2 viral copies this assay can detect is 131 copies/mL. A negative result does not preclude  SARS-Cov-2 infection and should not be used as the sole basis for treatment or other patient management decisions. A negative result may occur with  improper specimen collection/handling, submission of specimen other than nasopharyngeal swab, presence of viral mutation(s) within the areas targeted by this assay, and inadequate number of viral copies (<131 copies/mL). A negative result must be combined with clinical observations, patient history, and epidemiological information. The expected result is Negative. Fact Sheet for Patients:  PinkCheek.be Fact Sheet for Healthcare Providers:  GravelBags.it This test is not yet ap proved or cleared by the [place] FDA and  has been authorized for detection and/or diagnosis of SARS-CoV-2 by FDA under an Emergency Use Authorization (EUA). This EUA will remain  in effect (meaning this test can be used) for the duration of the COVID-19 declaration under Section 564(b)(1) of the Act, 21 U.S.C. section 360bbb-3(b)(1), unless the authorization is terminated or revoked sooner.    Influenza A by PCR NEGATIVE NEGATIVE Final   Influenza B by PCR NEGATIVE NEGATIVE Final    Comment: (NOTE) The Xpert Xpress SARS-CoV-2/FLU/RSV assay is intended as an aid in  the diagnosis of influenza from  Nasopharyngeal swab specimens and  should not be used as a sole basis for treatment. Nasal washings and  aspirates are unacceptable for Xpert Xpress SARS-CoV-2/FLU/RSV  testing. Fact Sheet for Patients: PinkCheek.be Fact Sheet for Healthcare Providers: GravelBags.it This test is not yet approved or cleared by the [place] FDA and  has been authorized for detection and/or diagnosis of SARS-CoV-2 by  FDA under an Emergency Use Authorization (EUA). This EUA will remain  in effect (meaning this test can be used) for the duration of the  Covid-19  declaration under Section 564(b)(1) of the Act, 21  U.S.C. section 360bbb-3(b)(1), unless the authorization is  terminated or revoked. Performed at [facility], [address], [place], [place] [address]   Urine culture     Status: None   Collection Time: 02/26/19  3:31 AM   Specimen: In/Out Cath Urine  Result Value Ref Range Status   Specimen Description   Final    IN/OUT CATH URINE Performed at [facility], [address] [place], [place] [address]    Special Requests   Final    NONE Performed at [facility], [address], [place], [place] [address]    Culture   Final    NO GROWTH Performed at [facility], [place] [address] [place], [place] [address]    Report Status 02/27/2019 FINAL  Final    Coagulation Studies: No results for input(s): LABPROT, INR in the last 72 hours.  Urinalysis:  Recent Labs  Lab 02/26/19 0331  COLORURINE STRAW*  LABSPEC 1.031*  PHURINE 7.0  GLUCOSEU 50*  HGBUR SMALL*  BILIRUBINUR NEGATIVE  KETONESUR NEGATIVE  PROTEINUR NEGATIVE  NITRITE NEGATIVE  LEUKOCYTESUR NEGATIVE    Lipid Panel:     Component Value Date/Time   CHOL 250 (H) 01/01/2018 1050   TRIG 207 (H) 01/01/2018 1050   HDL 68 01/01/2018 1050   CHOLHDL 3.7 01/01/2018 1050   CHOLHDL 2.5 11/06/2016 0905   LDLCALC 141 (H) 01/01/2018 1050   LDLCALC 152 (H) 11/06/2016 0905    HgbA1C: No results found for: HGBA1C  Urine Drug Screen:  No results found for: LABOPIA, COCAINSCRNUR, LABBENZ, AMPHETMU, THCU, LABBARB  Alcohol Level: No results for input(s): ETH in the last 168 hours.  Imaging: No results found.   Assessment/Plan: 59 y.o. female with a history of breast cancer status post lumpectomy, HTN, HLD, mental retardation nonverbal at baseline, recurrent aspiration pneumonia, seizure disorder, who came to the hospital on 1/14 brought by family for abdominal pain and vomiting with brown-green emesis.  Patient noted in the ED to have a  GTC seizure on 1/15.  Due to GI symptoms unclear how many doses of Ativan and Lamictal were not kept down/missed.  Breakthrough seizure etiology likely multifactorial and related to medication issues and concurrent infection/stressors.  Patient restarted on Ativan and Lamictal continued.  Patient loaded with Keppra as well and has been maintained on Keppra at 5103m BID.    Recommendations: 1. Continue seizure precautions 2. Continue Lamictal and Ativan at doses patient was on prior to admission 3. Start taper of Keppra.  Would continue at 2569mBID for the next two days then discontinue.  4. If further breakthrough seizures will re-evaluate.       LeAlexis GoodellMD Neurology [phone]/18/2021, 11:28 AM

## 2019-03-01 NOTE — Progress Notes (Signed)
Called report to Butch Penny, nurse for [employer] services

## 2019-03-01 NOTE — TOC Progression Note (Signed)
Transition of Care [facility]) - Progression Note    Patient Details  Name: Helen Patton MRN: [id number] Date of Birth: December 08, 1960  [facility]) CM/SW Contact  Su Hilt, RN Phone Number: 03/01/2019, 8:59 AM  Clinical Narrative:    Damaris Schooner to Mrs Stann Mainland at [facility] home, she will check with transport to see if they can pick the patient up and will call me back        Expected Discharge Plan and Services                                                 Social Determinants of Health (SDOH) Interventions    Readmission Risk Interventions No flowsheet data found.

## 2019-03-01 NOTE — TOC Progression Note (Signed)
Transition of Care [facility]) - Progression Note    Patient Details  Name: Helen Patton MRN: [id number] Date of Birth: February 06, 1961  Transition of Care [facility]) CM/SW [place], RN Phone Number: 03/01/2019, 10:42 AM  Clinical Narrative:     When the patient discharges the nurse to call Butch Penny at [phone] for report       Expected Discharge Plan and Services           Expected Discharge Date: 03/01/19                                     Social Determinants of Health (SDOH) Interventions    Readmission Risk Interventions No flowsheet data found.

## 2019-03-01 NOTE — Progress Notes (Signed)
Attempted x 2 to Legal guardian, CSW will follow up

## 2019-03-02 ENCOUNTER — Telehealth: Payer: Self-pay

## 2019-03-02 NOTE — Telephone Encounter (Signed)
Transition Care Management Follow-up Telephone Call  Date of discharge and from where: [facility] on 03/01/19.  How have you been since you were released from the hospital? Doing better. Pt is back to "tweeting" which is her line of communication. Pt has had some diarrhea but not as much as previously. Last b/p reading was 116/82 and pulse was 100. The nurses set up pts bed to rest in but pt has not wanted to lay down. Declined any other s/s.  Any questions or concerns? No   Items Reviewed:  Did the pt receive and understand the discharge instructions provided? Yes   Medications obtained and verified? Declined, will review at [place] apt.  Any new allergies since your discharge? No   Dietary orders reviewed? N/A  Do you have support at home? Yes, pt lives at a group home.  Other (ie: DME, Home Health, etc) N/A  Functional Questionnaire: (I = Independent and D = Dependent)  Bathing/Dressing- D   Meal Prep- D  Eating- I  Maintaining continence- D, wears protection at all times.   Transferring/Ambulation- I  Managing Meds- D   Follow up appointments reviewed:    PCP Hospital f/u appt confirmed? No , nurse at group home plans to call and set up apt today.  [facility] f/u appt confirmed? Unsure if apt was made, nurse is going to call and check with neurology today.    Are transportation arrangements needed? No   If their condition worsens, is the pt aware to call  their PCP or go to the ED? Yes  Was the patient provided with contact information for the PCP's office or ED? Yes  Was the pt encouraged to call back with questions or concerns? Yes

## 2019-03-02 NOTE — Telephone Encounter (Signed)
No HFU scheduled.  

## 2019-03-03 LAB — CULTURE, BLOOD (ROUTINE X 2)
Culture: NO GROWTH
Culture: NO GROWTH
Special Requests: ADEQUATE
Special Requests: ADEQUATE

## 2019-03-15 NOTE — Progress Notes (Signed)
Patient: Helen Patton Female    DOB: 03-04-1960   59 y.o.   MRN: [id number] Visit Date: 03/16/2019  Today's Provider: Wilhemena Durie, MD   Chief Complaint  Patient presents with  . Hospitalization Follow-up   Subjective:     HPI    Follow up Hospitalization  Patient was admitted to [facility] on 02/25/2019 and discharged on 03/01/2019. She was treated for Sepsis due to pneumonia.  Treatment for this included PCP: Follow-up with PCP in 1 week. Telephone follow up was done on 03/02/2019 She reports good compliance with treatment. She reports this condition is Improved. Clinically she is back to normal.  Breathing well, no cough and has been  acting her normal self recently.  Staff members of brought her in says she has no issues recently. ------------------------------------------------------------------------------------     Allergies  Allergen Reactions  . Sulfa Antibiotics Other (See Comments)    "Does not know"  . Keflex [Cephalexin] Other (See Comments)    "Does not know" unknown     Current Outpatient Medications:  .  acetaminophen (TYLENOL) 325 MG tablet, Take 2 tablets (650 mg total) by mouth every 6 (six) hours as needed., Disp: 100 tablet, Rfl: 11 .  alendronate (FOSAMAX) 70 MG tablet, TAKE 1 TABLET  BY MOUTH WEEKLY EARLY MORNING BEFORE FOOD/MEDS WITH WATER. DO NOT LIE DOWN FOR 30 MINUTES, Disp: 4 tablet, Rfl: 12 .  amLODipine (NORVASC) 10 MG tablet, TAKE 1 TABLET BY MOUTH AT BEDTIME, Disp: 30 tablet, Rfl: 12 .  BENZOYL PEROXIDE 5 % external wash, USE TOPICALLY ONCE DAILY, Disp: 150 g, Rfl: 3 .  calcium-vitamin D (OSCAL WITH D) 500-200 MG-UNIT TABS tablet, TAKE 1 TABLET BY MOUTH EVERY DAY WITH BREAKFAST, Disp: 30 tablet, Rfl: 11 .  cetirizine (ZYRTEC) 10 MG tablet, Take 1 tablet (10 mg total) by mouth daily., Disp: 90 tablet, Rfl: 3 .  clotrimazole (LOTRIMIN) 1 % cream, APPLY TOPICALLY TWICE A DAY AS NEEDED FOR RASH IN SKIN FOLDS (Patient not taking:  Reported on 02/25/2019), Disp: 30 g, Rfl: 0 .  coal tar (NEUTROGENA T-GEL) 0.5 % shampoo, Use as directed (Patient not taking: Reported on 02/25/2019), Disp: 30 mL, Rfl: 11 .  cyanocobalamin (,VITAMIN B-12,) 1000 MCG/ML injection, GIVE 1000 MCG INJECTION "IM" ONCE A MONTH (B-12 SUPPLEMENT), Disp: 1 mL, Rfl: 11 .  diphenhydrAMINE (BENADRYL) 12.5 MG chewable tablet, Chew 1 tablet (12.5 mg total) by mouth 3 (three) times daily., Disp: 90 tablet, Rfl: 11 .  docusate sodium (COLACE) 100 MG capsule, TAKE 1 CAPSULE BY MOUTH 2 TIMES PER DAY, Disp: 60 capsule, Rfl: 12 .  DULoxetine (CYMBALTA) 60 MG capsule, Take 1 capsule (60 mg total) by mouth daily., Disp: 30 capsule, Rfl: 11 .  ipratropium (ATROVENT) 0.03 % nasal spray, Place 2 sprays into both nostrils 3 (three) times daily., Disp: 30 mL, Rfl: 12 .  lamoTRIgine (LAMICTAL) 150 MG tablet, TAKE 2 TABLETS BY MOUTH 2 TIMES PER DAY, Disp: 120 tablet, Rfl: 5 .  letrozole (FEMARA) 2.5 MG tablet, TAKE 1 TABLET BY MOUTH EVERY DAY, Disp: 30 tablet, Rfl: 12 .  levETIRAcetam (KEPPRA) 250 MG tablet, Take 1 tablet (250 mg total) by mouth 2 (two) times daily for 2 days. Then stop., Disp: 4 tablet, Rfl: 0 .  LORazepam (ATIVAN) 1 MG tablet, Take 1 tablet by mouth every morning, and 1/2 tablet at noon and bedtime., Disp: 90 tablet, Rfl: 3 .  losartan (COZAAR) 100 MG tablet, Take 1 tablet (  100 mg total) by mouth daily., Disp: 30 tablet, Rfl: 11 .  pantoprazole (PROTONIX) 40 MG tablet, TAKE 1 TABLET BY MOUTH 2 TIMES PER DAY **DO NOT CRUSH**, Disp: 60 tablet, Rfl: 11 .  senna (SENOKOT) 8.6 MG TABS tablet, Take 1 tablet (8.6 mg total) by mouth 2 (two) times daily., Disp: 120 tablet, Rfl: 0 .  terbinafine (LAMISIL) 1 % cream, Use 2 times a week as needed, Disp: 30 g, Rfl: 2 .  triamcinolone (KENALOG) 0.025 % ointment, Apply 1 application topically 2 (two) times daily as needed., Disp: , Rfl:   Review of Systems  Constitutional: Negative for appetite change, chills, fatigue and  fever.  HENT: Negative.   Eyes: Negative.   Respiratory: Negative for chest tightness and shortness of breath.   Cardiovascular: Negative for chest pain and palpitations.  Gastrointestinal: Negative for abdominal pain, nausea and vomiting.  Allergic/Immunologic: Negative.   Neurological: Negative for dizziness and weakness.  Psychiatric/Behavioral: Negative.     Social History   Tobacco Use  . Smoking status: Never Smoker  . Smokeless tobacco: Never Used  Substance Use Topics  . Alcohol use: No      Objective:   There were no vitals taken for this visit. There were no vitals filed for this visit.There is no height or weight on file to calculate BMI.   Physical Exam Vitals reviewed.  Constitutional:      Appearance: Normal appearance.  HENT:     Head:     Comments: Patient with congenital microcephaly    Right Ear: External ear normal.     Left Ear: External ear normal.  Cardiovascular:     Rate and Rhythm: Normal rate and regular rhythm.     Heart sounds: Normal heart sounds.  Pulmonary:     Breath sounds: Normal breath sounds.  Skin:    General: Skin is warm and dry.  Neurological:     Mental Status: Mental status is at baseline.  Psychiatric:        Mood and Affect: Mood normal.      No results found for any visits on 03/16/19.     Assessment & Plan    1. Pneumonia due to infectious organism, unspecified laterality, unspecified part of lung Pneumonia on CT scan only.  Likely she has recovered.  Chest x-ray follow-up needed.  2. Sepsis without acute organ dysfunction, due to unspecified organism ([place]) Clinically resolved.  3. 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  She lives in a group home  4. Malignant neoplasm of lower-outer quadrant of left breast of female, estrogen receptor positive ([place]) Clinically stable     Wilhemena Durie, MD  [facility]

## 2019-03-16 ENCOUNTER — Ambulatory Visit (INDEPENDENT_AMBULATORY_CARE_PROVIDER_SITE_OTHER): Payer: Medicare Other | Admitting: Family Medicine

## 2019-03-16 ENCOUNTER — Other Ambulatory Visit: Payer: Self-pay

## 2019-03-16 ENCOUNTER — Encounter: Payer: Self-pay | Admitting: Family Medicine

## 2019-03-16 VITALS — BP 123/80 | HR 87 | Temp 96.8°F | Wt 118.8 lb

## 2019-03-16 DIAGNOSIS — J189 Pneumonia, unspecified organism: Secondary | ICD-10-CM

## 2019-03-16 DIAGNOSIS — F79 Unspecified intellectual disabilities: Secondary | ICD-10-CM | POA: Diagnosis not present

## 2019-03-16 DIAGNOSIS — C50512 Malignant neoplasm of lower-outer quadrant of left female breast: Secondary | ICD-10-CM

## 2019-03-16 DIAGNOSIS — Z170 Estrogen receptor positive status [ER+]: Secondary | ICD-10-CM | POA: Diagnosis not present

## 2019-03-16 DIAGNOSIS — A419 Sepsis, unspecified organism: Secondary | ICD-10-CM

## 2019-03-24 ENCOUNTER — Ambulatory Visit
Admission: RE | Admit: 2019-03-24 | Discharge: 2019-03-24 | Disposition: A | Discharge status: Home / Self Care | Payer: Medicare Other | Source: Ambulatory Visit | Attending: Surgery | Admitting: Surgery

## 2019-03-24 DIAGNOSIS — Z1231 Encounter for screening mammogram for malignant neoplasm of breast: Secondary | ICD-10-CM | POA: Insufficient documentation

## 2019-03-29 ENCOUNTER — Telehealth: Payer: Self-pay

## 2019-03-29 DIAGNOSIS — F411 Generalized anxiety disorder: Secondary | ICD-10-CM | POA: Diagnosis not present

## 2019-03-29 DIAGNOSIS — R569 Unspecified convulsions: Secondary | ICD-10-CM | POA: Diagnosis not present

## 2019-03-29 DIAGNOSIS — Z79899 Other long term (current) drug therapy: Secondary | ICD-10-CM | POA: Diagnosis not present

## 2019-03-29 DIAGNOSIS — R251 Tremor, unspecified: Secondary | ICD-10-CM | POA: Diagnosis not present

## 2019-03-29 DIAGNOSIS — R2689 Other abnormalities of gait and mobility: Secondary | ICD-10-CM | POA: Diagnosis not present

## 2019-03-29 DIAGNOSIS — G804 Ataxic cerebral palsy: Secondary | ICD-10-CM | POA: Diagnosis not present

## 2019-03-29 NOTE — Telephone Encounter (Signed)
Spoke with patient's mother that per Dr.Piscoya would like patient to have her yearly mammogram and to follow up here in our office. Patient's mother states she would like for her daughter to stay under Dr.Byrnett's care since he has history with the patient. I provided patient's mother and nurse with Dr.Byrnett's new office information. Patient's mother verbalized understanding.

## 2019-03-29 NOTE — Telephone Encounter (Signed)
-----   Message from Olean Ree, MD sent at 03/29/2019 11:49 AM EST ----- Regarding: RE: mammogram Hi Daiveon Markman,  Thanks for checking.  Looking at Dr. Dwyane Luo last note, she may have issues with mental retardation.  I would suggest that we try to schedule the mammogram one more time.  It may have been that the patient was having a bad day, because I see she's been getting yearly mammograms.  If that new appointment doesn't work, then we can just see her without mammogram.  Thanks,  Jacqulyn Bath  ----- Message ----- From: Irena Reichmann, CMA Sent: 03/29/2019  11:10 AM EST To: Olean Ree, MD Subject: RE: mammogram                                  Good morning Dr.Piscoya, I just spoke with [facility] on behalf of the patient to follow up with mammo f/u. Per [place] [facility] patient refused the exam by two techs and patient's caregiver also tried speaking with patient to see if she could get her to go through with exam. Patient still denied exam. Should we cancelled the appointment or please advise.   Thank you, Concetta Guion  ----- Message ----- From: Olean Ree, MD Sent: 03/29/2019  10:42 AM EST To: [facility] Clinical Pool Subject: mammogram                                      Hi,  This patient is scheduled with me on 2/17, but doesn't look like she's had her mammogram yet.  Can y'all check with [place] or with the patient to see when it's supposed to be done?  We may need to reschedule her appointment depending on when the mammogram is scheduled for.  Thanks,  Jose  ----- Message ----- From: SYSTEM Sent: 03/29/2019  12:06 AM EST To: Olean Ree, MD

## 2019-03-31 ENCOUNTER — Ambulatory Visit: Payer: Medicare Other | Admitting: Surgery

## 2019-04-14 ENCOUNTER — Ambulatory Visit: Admission: RE | Admit: 2019-04-14 | Payer: Medicare Other | Source: Ambulatory Visit

## 2019-06-22 DIAGNOSIS — F339 Major depressive disorder, recurrent, unspecified: Secondary | ICD-10-CM | POA: Diagnosis not present

## 2019-07-14 ENCOUNTER — Ambulatory Visit: Payer: Self-pay | Admitting: Family Medicine

## 2019-07-16 NOTE — Progress Notes (Signed)
Helen Patton,acting as a scribe for Helen Durie, MD.,have documented all relevant documentation on the behalf of Helen Durie, MD,as directed by  Helen Durie, MD while in the presence of Helen Durie, MD.  Established patient visit   Patient: Helen Patton   DOB: 1960/03/23   59 y.o. Female  MRN: [id number] Visit Date: 07/19/2019  Today's healthcare provider: Wilhemena Durie, MD   Chief Complaint  Patient presents with  . Hypertension  . Hyperlipidemia   Subjective    HPI  Helen Patton is now out of the group home at which she was confined during the COVID-19 pandemic and is back to her normal living situation.  She has had all of her vaccines for Covid.  There are no complaints today.  She is back to her normal daily activities.  These are limited but comforting to her. Needs FL2 forms filled out.   Hypertension, follow-up  BP Readings from Last 3 Encounters:  07/19/19 107/63  03/16/19 123/80  03/01/19 (!) 147/63   Wt Readings from Last 3 Encounters:  07/19/19 120 lb 6.4 oz (54.6 kg)  03/16/19 118 lb 12.8 oz (53.9 kg)  03/01/19 159 lb 13.3 oz (72.5 kg)     She was last seen for hypertension 8 months ago.  BP at that visit was 103/63. Management since that visit includes; controlled. She reports excellent compliance with treatment. She is not having side effects.  She is exercising. She is adherent to low salt diet.   Outside blood pressures are being checked.  She does not smoke.  Use of agents associated with hypertension: none.   --------------------------------------------------------------------------------------------------- Lipid/Cholesterol, follow-up  Last Lipid Panel: Lab Results  Component Value Date   CHOL 250 (H) 01/01/2018   LDLCALC 141 (H) 01/01/2018   HDL 68 01/01/2018   TRIG 207 (H) 01/01/2018    She was last seen for this 8 months ago.  Management since that visit includes; last checked 01/01/2018  showing-Her labwork is stable, cholesterol slightly high.  She reports excellent compliance with treatment. She is not having side effects.  She is following a Low Sodium diet. Current exercise: walking  Last metabolic panel Lab Results  Component Value Date   GLUCOSE 104 (H) 03/01/2019   NA 146 (H) 03/01/2019   K 3.0 (L) 03/01/2019   BUN 8 03/01/2019   CREATININE 0.61 03/01/2019   GFRNONAA >60 03/01/2019   GFRAA >60 03/01/2019   CALCIUM 8.8 (L) 03/01/2019   AST 25 02/28/2019   ALT 17 02/28/2019   The 10-year ASCVD risk score Mikey Bussing DC Jr., et al., 2013) is: 3.6%  ---------------------------------------------------------------------------------------------------   Social History   Tobacco Use  . Smoking status: Never Smoker  . Smokeless tobacco: Never Used  Substance Use Topics  . Alcohol use: No  . Drug use: No       Medications: Outpatient Medications Prior to Visit  Medication Sig  . amLODipine (NORVASC) 10 MG tablet TAKE 1 TABLET BY MOUTH AT BEDTIME  . cetirizine (ZYRTEC) 10 MG tablet Take 1 tablet (10 mg total) by mouth daily.  Marland Kitchen docusate sodium (COLACE) 100 MG capsule TAKE 1 CAPSULE BY MOUTH 2 TIMES PER DAY  . DULoxetine (CYMBALTA) 60 MG capsule Take 1 capsule (60 mg total) by mouth daily.  Marland Kitchen ipratropium (ATROVENT) 0.03 % nasal spray Place 2 sprays into both nostrils 3 (three) times daily.  Marland Kitchen lamoTRIgine (LAMICTAL) 150 MG tablet TAKE 2 TABLETS BY MOUTH 2 TIMES PER  DAY  . LORazepam (ATIVAN) 1 MG tablet Take 1 tablet by mouth every morning, and 1/2 tablet at noon and bedtime.  Marland Kitchen losartan (COZAAR) 100 MG tablet Take 1 tablet (100 mg total) by mouth daily.  . pantoprazole (PROTONIX) 40 MG tablet TAKE 1 TABLET BY MOUTH 2 TIMES PER DAY **DO NOT CRUSH**  . senna (SENOKOT) 8.6 MG TABS tablet Take 1 tablet (8.6 mg total) by mouth 2 (two) times daily.  Marland Kitchen terbinafine (LAMISIL) 1 % cream Use 2 times a week as needed  . acetaminophen (TYLENOL) 325 MG tablet Take 2 tablets  (650 mg total) by mouth every 6 (six) hours as needed. (Patient not taking: Reported on 07/19/2019)  . alendronate (FOSAMAX) 70 MG tablet TAKE 1 TABLET  BY MOUTH WEEKLY EARLY MORNING BEFORE FOOD/MEDS WITH WATER. DO NOT LIE DOWN FOR 30 MINUTES (Patient not taking: Reported on 07/19/2019)  . BENZOYL PEROXIDE 5 % external wash USE TOPICALLY ONCE DAILY (Patient not taking: Reported on 07/19/2019)  . calcium-vitamin D (OSCAL WITH D) 500-200 MG-UNIT TABS tablet TAKE 1 TABLET BY MOUTH EVERY DAY WITH BREAKFAST (Patient not taking: Reported on 07/19/2019)  . clotrimazole (LOTRIMIN) 1 % cream APPLY TOPICALLY TWICE A DAY AS NEEDED FOR RASH IN SKIN FOLDS (Patient not taking: Reported on 02/25/2019)  . coal tar (NEUTROGENA T-GEL) 0.5 % shampoo Use as directed (Patient not taking: Reported on 02/25/2019)  . cyanocobalamin (,VITAMIN B-12,) 1000 MCG/ML injection GIVE 1000 MCG INJECTION "IM" ONCE A MONTH (B-12 SUPPLEMENT) (Patient not taking: Reported on 07/19/2019)  . diphenhydrAMINE (BENADRYL) 12.5 MG chewable tablet Chew 1 tablet (12.5 mg total) by mouth 3 (three) times daily. (Patient not taking: Reported on 07/19/2019)  . letrozole (FEMARA) 2.5 MG tablet TAKE 1 TABLET BY MOUTH EVERY DAY (Patient not taking: Reported on 07/19/2019)  . levETIRAcetam (KEPPRA) 250 MG tablet Take 1 tablet (250 mg total) by mouth 2 (two) times daily for 2 days. Then stop.  Marland Kitchen triamcinolone (KENALOG) 0.025 % ointment Apply 1 application topically 2 (two) times daily as needed.   No facility-administered medications prior to visit.    Review of Systems  Constitutional: Negative for appetite change, chills, fatigue and fever.  HENT: Negative.   Eyes: Negative.   Respiratory: Negative for chest tightness and shortness of breath.   Cardiovascular: Negative for chest pain and palpitations.  Gastrointestinal: Negative for abdominal pain, nausea and vomiting.  Endocrine: Negative.   Allergic/Immunologic: Negative.   Neurological: Negative for  dizziness and weakness.  Hematological: Negative.   Psychiatric/Behavioral: Negative.        Objective    BP 107/63 (BP Location: Right Arm, Patient Position: Sitting, Cuff Size: Normal)   Pulse 86   Temp (!) 97.5 F (36.4 C) (Temporal)   Wt 120 lb 6.4 oz (54.6 kg)   BMI 18.85 kg/m  BP Readings from Last 3 Encounters:  07/19/19 107/63  03/16/19 123/80  03/01/19 (!) 147/63   Wt Readings from Last 3 Encounters:  07/19/19 120 lb 6.4 oz (54.6 kg)  03/16/19 118 lb 12.8 oz (53.9 kg)  03/01/19 159 lb 13.3 oz (72.5 kg)      Physical Exam Vitals reviewed.  Constitutional:      Appearance: Normal appearance.  HENT:     Head:     Comments: Patient with congenital microcephaly    Right Ear: External ear normal.     Left Ear: External ear normal.  Cardiovascular:     Rate and Rhythm: Normal rate and regular rhythm.  Heart sounds: Murmur present.     Comments: 2/6 early systolic murmur heard best at the right upper sternal border Pulmonary:     Breath sounds: Normal breath sounds.  Skin:    General: Skin is warm and dry.  Neurological:     Mental Status: Mental status is at baseline.  Psychiatric:        Mood and Affect: Mood normal.       No results found for any visits on 07/19/19.  Assessment & Plan     1. Hypertension, unspecified type Controlled.  New aortic murmur noted today.  We will follow this clinically.  2. Hyperlipidemia, unspecified hyperlipidemia type   3. Gastroesophageal reflux disease, unspecified whether esophagitis present   4. Avascular necrosis of femoral head, unspecified laterality ([place])   5. Seizure ([place]) Due to congenital microcephaly.  Followed by neurology.  6. Status post total replacement of hip, unspecified laterality   7. Intellectual disability   8. Malignant neoplasm of lower-outer quadrant of left breast of female, estrogen receptor positive ([place])   9. Diverticulosis   10. History of recent pneumonia Found on  CT.  Would not follow-up x-ray or CT at this time.  11. Adrenal nodule ([place]) Would not aggressively address this in this patient with significant disability.   No follow-ups on file.      I, Helen Durie, MD, have reviewed all documentation for this visit. The documentation on 07/21/19 for the exam, diagnosis, procedures, and orders are all accurate and complete.    Zariana Strub Cran[place] Mon, MD  [facility] [phone] (phone) [phone] (fax)  [place]

## 2019-07-19 ENCOUNTER — Other Ambulatory Visit: Payer: Self-pay

## 2019-07-19 ENCOUNTER — Ambulatory Visit (INDEPENDENT_AMBULATORY_CARE_PROVIDER_SITE_OTHER): Payer: Medicare Other | Admitting: Family Medicine

## 2019-07-19 ENCOUNTER — Encounter: Payer: Self-pay | Admitting: Family Medicine

## 2019-07-19 VITALS — BP 107/63 | HR 86 | Temp 97.5°F | Wt 120.4 lb

## 2019-07-19 DIAGNOSIS — F79 Unspecified intellectual disabilities: Secondary | ICD-10-CM | POA: Diagnosis not present

## 2019-07-19 DIAGNOSIS — Z96649 Presence of unspecified artificial hip joint: Secondary | ICD-10-CM

## 2019-07-19 DIAGNOSIS — K219 Gastro-esophageal reflux disease without esophagitis: Secondary | ICD-10-CM | POA: Diagnosis not present

## 2019-07-19 DIAGNOSIS — E785 Hyperlipidemia, unspecified: Secondary | ICD-10-CM | POA: Diagnosis not present

## 2019-07-19 DIAGNOSIS — M87059 Idiopathic aseptic necrosis of unspecified femur: Secondary | ICD-10-CM | POA: Diagnosis not present

## 2019-07-19 DIAGNOSIS — C50512 Malignant neoplasm of lower-outer quadrant of left female breast: Secondary | ICD-10-CM | POA: Diagnosis not present

## 2019-07-19 DIAGNOSIS — E278 Other specified disorders of adrenal gland: Secondary | ICD-10-CM | POA: Diagnosis not present

## 2019-07-19 DIAGNOSIS — I10 Essential (primary) hypertension: Secondary | ICD-10-CM | POA: Diagnosis not present

## 2019-07-19 DIAGNOSIS — R569 Unspecified convulsions: Secondary | ICD-10-CM

## 2019-07-19 DIAGNOSIS — K5790 Diverticulosis of intestine, part unspecified, without perforation or abscess without bleeding: Secondary | ICD-10-CM

## 2019-07-19 DIAGNOSIS — Z8701 Personal history of pneumonia (recurrent): Secondary | ICD-10-CM | POA: Diagnosis not present

## 2019-07-19 DIAGNOSIS — Z170 Estrogen receptor positive status [ER+]: Secondary | ICD-10-CM

## 2019-07-27 ENCOUNTER — Ambulatory Visit: Payer: Medicare Other | Admitting: Family Medicine

## 2019-09-16 ENCOUNTER — Ambulatory Visit: Payer: Self-pay | Admitting: Family Medicine

## 2019-09-16 DIAGNOSIS — M79671 Pain in right foot: Secondary | ICD-10-CM | POA: Diagnosis not present

## 2019-09-16 DIAGNOSIS — Z853 Personal history of malignant neoplasm of breast: Secondary | ICD-10-CM | POA: Diagnosis not present

## 2019-09-16 DIAGNOSIS — L03031 Cellulitis of right toe: Secondary | ICD-10-CM | POA: Diagnosis not present

## 2019-09-16 DIAGNOSIS — M7731 Calcaneal spur, right foot: Secondary | ICD-10-CM | POA: Diagnosis not present

## 2019-09-16 NOTE — Telephone Encounter (Signed)
Phone call rec'd. From Caregiver of [place].  Reported that pt. Has been in a Day Program, and returned to the [place] approx. 3:30 PM.  Caregiver reported pt. C/o severe pain in the right small toe.  Stated the entire toe is red. Caregiver denied any swelling in the right foot or lower leg.  Denied any drainage from the right small toe.  Reported that pt. normally walks about independently.  Was not able to walk to the [employer], to be taken back to the Group Home; had to be transferred to the [place] via wheelchair.  Vital signs: T. 96 degrees, P. 86, BP 119/63 when ret'd to the Group Home, about one hour prior to call to office. Denied any signs of shortness of breath.  Reported the pt. Has not had anything for her pain.  Stated pt. Is not able to communicate her complaints, due to a "disability".  Stated pt. Cries out in pain if her right small toe is touched.  Also reported pt. Is "pointing to head, like she has a headache".  Advised Caregiver, that due to the time of day (4:30 PM) and severity of pt's pain, to take her to UC for evaluation.  Caregiver verb. Understanding.    Reason for Disposition . [1] SEVERE pain (e.g., excruciating, unable to do any normal activities) AND [2] not improved after 2 hours of pain medicine  Answer Assessment - Initial Assessment Questions 1. ONSET: "When did the pain start?"      Caregiver is not sure - pt. Is not able communicate her pain at all; cries out if anything touches toe 2. LOCATION: "Where is the pain located?"      Right small toe  3. PAIN: "How bad is the pain?"    (Scale 1-10; or mild, moderate, severe)  - MILD (1-3): doesn't interfere with normal activities.   - MODERATE (4-7): interferes with normal activities (e.g., work or school) or awakens from sleep, limping.   - SEVERE (8-10): excruciating pain, unable to do any normal activities, unable to walk.      severe 4. WORK OR EXERCISE: "Has there been any recent work or exercise that  involved this part of the body?"      No  5. CAUSE: "What do you think is causing the foot pain?"     No  6. OTHER SYMPTOMS: "Do you have any other symptoms?" (e.g., leg pain, rash, fever, numbness)     Denied fever; T. 96 degrees, P. 86, BP 119/63.  Caregiver stated that pt. Was noted to be limping this AM; Was in a Day Program, and when she was released back to the [place]; had to use a wheelchair because she couldn't walk.  Pt. Points to her head that she has a headache.  7. PREGNANCY: "Is there any chance you are pregnant?" "When was your last menstrual period?"     N/a  Protocols used: FOOT PAIN-A-AH

## 2019-09-21 ENCOUNTER — Telehealth: Payer: Self-pay

## 2019-09-21 NOTE — Telephone Encounter (Signed)
Copied from [place] [id number]. Topic: General - Inquiry >> Sep 21, 2019 10:41 AM Mathis Bud wrote: Reason for CRM: [facility] requesting PA for seeing patient 8/5.   Call back [phone]

## 2019-09-21 NOTE — Telephone Encounter (Signed)
Please advise 

## 2019-09-25 ENCOUNTER — Other Ambulatory Visit: Payer: Self-pay | Admitting: Family Medicine

## 2019-09-29 DIAGNOSIS — M2042 Other hammer toe(s) (acquired), left foot: Secondary | ICD-10-CM | POA: Diagnosis not present

## 2019-09-29 DIAGNOSIS — M2041 Other hammer toe(s) (acquired), right foot: Secondary | ICD-10-CM | POA: Diagnosis not present

## 2019-09-29 DIAGNOSIS — L84 Corns and callosities: Secondary | ICD-10-CM | POA: Diagnosis not present

## 2019-09-29 DIAGNOSIS — B351 Tinea unguium: Secondary | ICD-10-CM | POA: Diagnosis not present

## 2019-10-07 ENCOUNTER — Other Ambulatory Visit: Payer: Self-pay | Admitting: Family Medicine

## 2019-10-07 NOTE — Telephone Encounter (Signed)
Medication Refill - Medication: B-12 injections and syringes  Has the patient contacted their pharmacy? Yes.   (Agent: If no, request that the patient contact the pharmacy for the refill.) (Agent: If yes, when and what did the pharmacy advise?)  Preferred Pharmacy (with phone number or street name):[facility], [place]: Please be advised that RX refills may take up to 3 business days. We ask that you follow-up with your pharmacy.

## 2019-10-15 DIAGNOSIS — Z170 Estrogen receptor positive status [ER+]: Secondary | ICD-10-CM | POA: Diagnosis not present

## 2019-10-15 DIAGNOSIS — C50512 Malignant neoplasm of lower-outer quadrant of left female breast: Secondary | ICD-10-CM | POA: Diagnosis not present

## 2019-10-19 ENCOUNTER — Encounter: Payer: Self-pay | Admitting: General Surgery

## 2019-10-21 ENCOUNTER — Telehealth: Payer: Self-pay | Admitting: Family Medicine

## 2019-10-21 NOTE — Telephone Encounter (Signed)
Copied from [place] [phone]. Topic: General - Other >> Oct 21, 2019  8:15 AM Celene Kras wrote: Reason for CRM: Stanton Kidney, from [facility], called stating that she is needing o have a DC order for the pts medication letrozole. Please advise.

## 2019-10-21 NOTE — Telephone Encounter (Signed)
Ok to d/c.

## 2019-10-25 ENCOUNTER — Other Ambulatory Visit: Payer: Self-pay | Admitting: Family Medicine

## 2019-10-27 ENCOUNTER — Telehealth: Payer: Self-pay | Admitting: Family Medicine

## 2019-10-27 NOTE — Telephone Encounter (Signed)
Copied from [place] [id number]. Topic: Medicare AWV >> Oct 27, 2019 11:59 AM Cher Nakai R wrote: Reason for CRM: No answer unable to leave message for patient to call back and schedule Medicare Annual Wellness Visit (AWV) either virtually or in office.  Last AWV 06/27/2015  Please schedule at anytime with [facility] Health Advisor.  If any questions, please contact me at [phone]

## 2019-11-09 DIAGNOSIS — Z23 Encounter for immunization: Secondary | ICD-10-CM | POA: Diagnosis not present

## 2019-11-10 DIAGNOSIS — G804 Ataxic cerebral palsy: Secondary | ICD-10-CM | POA: Diagnosis not present

## 2019-11-10 DIAGNOSIS — R569 Unspecified convulsions: Secondary | ICD-10-CM | POA: Diagnosis not present

## 2019-11-10 DIAGNOSIS — Z79899 Other long term (current) drug therapy: Secondary | ICD-10-CM | POA: Diagnosis not present

## 2019-11-16 DIAGNOSIS — Z79899 Other long term (current) drug therapy: Secondary | ICD-10-CM | POA: Diagnosis not present

## 2019-12-24 ENCOUNTER — Ambulatory Visit
Admission: EM | Admit: 2019-12-24 | Discharge: 2019-12-24 | Disposition: A | Discharge status: Home / Self Care | Payer: Medicare Other | Attending: Emergency Medicine | Admitting: Emergency Medicine

## 2019-12-24 ENCOUNTER — Encounter: Payer: Self-pay | Admitting: Emergency Medicine

## 2019-12-24 ENCOUNTER — Ambulatory Visit (INDEPENDENT_AMBULATORY_CARE_PROVIDER_SITE_OTHER): Payer: Medicare Other

## 2019-12-24 ENCOUNTER — Other Ambulatory Visit: Payer: Self-pay

## 2019-12-24 DIAGNOSIS — J690 Pneumonitis due to inhalation of food and vomit: Secondary | ICD-10-CM | POA: Diagnosis not present

## 2019-12-24 DIAGNOSIS — R109 Unspecified abdominal pain: Secondary | ICD-10-CM

## 2019-12-24 DIAGNOSIS — Z20822 Contact with and (suspected) exposure to covid-19: Secondary | ICD-10-CM | POA: Insufficient documentation

## 2019-12-24 DIAGNOSIS — R0981 Nasal congestion: Secondary | ICD-10-CM

## 2019-12-24 DIAGNOSIS — L989 Disorder of the skin and subcutaneous tissue, unspecified: Secondary | ICD-10-CM | POA: Diagnosis not present

## 2019-12-24 DIAGNOSIS — R059 Cough, unspecified: Secondary | ICD-10-CM | POA: Diagnosis not present

## 2019-12-24 DIAGNOSIS — J189 Pneumonia, unspecified organism: Secondary | ICD-10-CM | POA: Diagnosis not present

## 2019-12-24 DIAGNOSIS — J069 Acute upper respiratory infection, unspecified: Secondary | ICD-10-CM | POA: Diagnosis not present

## 2019-12-24 DIAGNOSIS — R911 Solitary pulmonary nodule: Secondary | ICD-10-CM | POA: Diagnosis not present

## 2019-12-24 LAB — SARS CORONAVIRUS 2 (TAT 6-24 HRS): SARS Coronavirus 2: NEGATIVE

## 2019-12-24 MED ORDER — DOXYCYCLINE HYCLATE 100 MG PO CAPS: 100.0000 mg | ORAL_CAPSULE | Freq: Two times a day (BID) | ORAL | 0 refills | Status: AC

## 2019-12-24 NOTE — ED Provider Notes (Signed)
[facility]    CSN: [id number] Arrival date & time: 12/24/19  1138      History   Chief Complaint Chief Complaint  Patient presents with  . Cough  . Abdominal Pain    HPI Helen Patton is a 59 y.o. female.   Helen Patton presents with a caregiver, who provides history,  with complaints of cough and congestion which started around 4 days ago. No known fevers. No vomiting or diarrhea. No specific known pain. She does have sores/scabbing to left side of her nose without any known trauma or irritation to the area. History of aspiration pneumonia. No specific incident of choking etc.  No work of breathing. Green nasal drainage. She is non-verbal at baseline.    ROS per HPI, negative if not otherwise mentioned.      Past Medical History:  Diagnosis Date  . Anxiety   . Arthritis    osteoarthritis  . Breast cancer ([place]) 2015   [name]  . Cancer ([place]) 11/2013   T1c, Nx; ER/ PR +; Her 2 neg invasive mammary carcinoma left breast. Wide excision only based on Tumor Board review.   . Cataract   . Depression   . Difficulty swallowing   . Falls   . GERD (gastroesophageal reflux disease)   . Hidradenitis 2012   MRSA, on chronic topical suppression  . History of hiatal hernia   . Hyperlipidemia   . Hypertension   . Mental retardation   . MRSA (methicillin resistant staph aureus) culture positive 2012  . Osteoporosis   . Pneumonia    aspiration pneumonia  . Seizures [facility])    last seizure February 2017    Patient Active Problem List   Diagnosis Date Noted  . Sepsis ([place]) 02/26/2019  . Postmenopausal bleeding 04/22/2018  . B12 deficiency 05/17/2016  . S/P total hip arthroplasty 08/23/2015  . Acute respiratory failure with hypoxia ([place]) 05/19/2015  . 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 Closed fracture of neck of right femur ([place]) 03/07/2015  . Anxiety 12/14/2014  . Arthralgia of hip 12/14/2014  . Athlete's foot  12/14/2014  . Cerebral palsy ([place]) 12/14/2014  . Cognitive decline 12/14/2014  . Clinical depression 12/14/2014  . Acid reflux 12/14/2014  . HLD (hyperlipidemia) 12/14/2014  . BP (high blood pressure) 12/14/2014  . Methicillin resistant Staphylococcus aureus infection 12/14/2014  . Adiposity 12/14/2014  . Breast cancer ([place]) 11/21/2013  . Intellectual disability 11/16/2013  . Seizure ([place]) 09/30/2013    Past Surgical History:  Procedure Laterality Date  . BREAST LUMPECTOMY Left 12/07/2013   T1b, NX, ER/PR positive, HER-2/neu not overexpressed, 7 mm.  Not candidate for chemotherapy/sentinel node/radiation based on mental retardation.  Marland Kitchen BREAST SURGERY Left 12/07/2013   T1b, Nx; ER+, PR+, her 2 not amplified. (Not candidate for chemotherapy/radiation based on mental retardation)  . broken leg    . CERVICAL ABLATION    . 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 dislocated hip    . HARDWARE REMOVAL  08/23/2015   Procedure: HARDWARE REMOVAL;  Surgeon: Dereck Leep, MD;  Location: [facility] ORS;  Service: Orthopedics;;  . TOTAL HIP REVISION Right 08/23/2015   Procedure: TOTAL HIP REVISION;  Surgeon: Dereck Leep, MD;  Location: [facility] ORS;  Service: Orthopedics;  Laterality: Right;    OB History   No obstetric history on  file.      Home Medications    Prior to Admission medications   Medication Sig Start Date End Date Taking? Authorizing Provider  acetaminophen (TYLENOL) 325 MG tablet Take 2 tablets (650 mg total) by mouth every 6 (six) hours as needed. 07/14/17  Yes Jerrol Banana., MD  alendronate (FOSAMAX) 70 MG tablet TAKE 1 TABLET  BY MOUTH WEEKLY EARLY MORNING BEFORE FOOD/MEDS WITH WATER. DO NOT LIE DOWN FOR 30 MINUTES 09/03/18  Yes Jerrol Banana., MD  amLODipine (NORVASC) 10 MG tablet TAKE 1 TABLET BY MOUTH AT BEDTIME 09/03/18  Yes Jerrol Banana., MD    calcium-vitamin D (OSCAL WITH D) 500-200 MG-UNIT TABS tablet TAKE 1 TABLET BY MOUTH EVERY DAY WITH BREAKFAST 11/03/18  Yes Jerrol Banana., MD  cetirizine (ZYRTEC) 10 MG tablet Take 1 tablet (10 mg total) by mouth daily. 12/23/18  Yes Jerrol Banana., MD  cyanocobalamin (,VITAMIN B-12,) 1000 MCG/ML injection INJECT 1ML=1000MCG INTRAMUSCULARLY EVERY MONTH (B-12 SUPPLEMENT) 10/07/19  Yes Jerrol Banana., MD  docusate sodium (COLACE) 100 MG capsule TAKE 1 CAPSULE BY MOUTH 2 TIMES PER DAY 09/03/18  Yes Jerrol Banana., MD  DULoxetine (CYMBALTA) 30 MG capsule Take 30 mg by mouth daily.   Yes [provider]  DULoxetine (CYMBALTA) 60 MG capsule Take 1 capsule (60 mg total) by mouth daily. 04/10/18  Yes Jerrol Banana., MD  ipratropium (ATROVENT) 0.03 % nasal spray Place 2 sprays into both nostrils 3 (three) times daily. 02/24/18  Yes Bacigalupo, [name], MD  lamoTRIgine (LAMICTAL) 150 MG tablet TAKE 2 TABLETS BY MOUTH 2 TIMES PER DAY 09/03/18  Yes Jerrol Banana., MD  LORazepam (ATIVAN) 0.5 MG tablet Take 0.5 mg by mouth 2 (two) times daily. Tice a day at noon and at bedtime   Yes [provider]  LORazepam (ATIVAN) 1 MG tablet Take 1 tablet by mouth every morning, and 1/2 tablet at noon and bedtime. Patient taking differently: 1 mg. Take 1 tablet by mouth every morning 12/03/18  Yes Jerrol Banana., MD  losartan (COZAAR) 100 MG tablet Take 1 tablet (100 mg total) by mouth daily. 10/20/18  Yes Jerrol Banana., MD  pantoprazole (PROTONIX) 40 MG tablet TAKE 1 TABLET BY MOUTH 2 TIMES PER DAY **DO NOT CRUSH** 12/23/18  Yes Jerrol Banana., MD  senna (SENOKOT) 8.6 MG TABS tablet Take 1 tablet (8.6 mg total) by mouth 2 (two) times daily. 03/01/19  Yes Lavina Hamman, MD  BD ECLIPSE SYRINGE 25G X 1" 3 ML MISC FOR USE WITH B12 10/07/19   Jerrol Banana., MD  BENZOYL PEROXIDE 5 % external wash USE TOPICALLY ONCE DAILY 10/27/19    Jerrol Banana., MD  clotrimazole (LOTRIMIN) 1 % cream APPLY TOPICALLY TWICE A DAY AS NEEDED FOR RASH IN SKIN FOLDS Patient not taking: Reported on 02/25/2019 02/05/17   Jerrol Banana., MD  coal tar (NEUTROGENA T-GEL) 0.5 % shampoo Use as directed Patient not taking: Reported on 02/25/2019 01/30/18   Jerrol Banana., MD  diphenhydrAMINE (BENADRYL) 12.5 MG chewable tablet Chew 1 tablet (12.5 mg total) by mouth 3 (three) times daily. Patient not taking: Reported on 07/19/2019 04/02/18   Jerrol Banana., MD  doxycycline (VIBRAMYCIN) 100 MG capsule Take 1 capsule (100 mg total) by mouth 2 (two) times daily for 5 days. 12/24/19 12/29/19  Zigmund Gottron, NP  letrozole (FEMARA) 2.5 MG tablet TAKE 1 TABLET BY MOUTH ONCE DAILY *HAZARDOUS DRUG: WEAR GLOVES* 09/25/19   Jerrol Banana., MD  levETIRAcetam (KEPPRA) 250 MG tablet Take 1 tablet (250 mg total) by mouth 2 (two) times daily for 2 days. Then stop. 03/01/19 03/03/19  Lavina Hamman, MD  terbinafine (LAMISIL) 1 % cream Use 2 times a week as needed 07/14/17   Jerrol Banana., MD  triamcinolone (KENALOG) 0.025 % ointment Apply 1 application topically 2 (two) times daily as needed.    [provider]    Family History Family History  Problem Relation Age of Onset  . Diabetes Father   . Parkinson's disease Father   . Neuropathy Mother   . Arthritis Mother   . Diabetes Mother   . Autoimmune disease Brother        AIDS  . Cancer Maternal Aunt 63       breast  . Breast cancer Maternal Aunt   . Hypertension Maternal Grandmother   . Cancer Paternal Grandmother     Social History Social History   Tobacco Use  . Smoking status: Never Smoker  . Smokeless tobacco: Never Used  Vaping Use  . Vaping Use: Never used  Substance Use Topics  . Alcohol use: No  . Drug use: No     Allergies   Sulfa antibiotics and Keflex [cephalexin]   Review of Systems Review of Systems   Physical Exam Triage  Vital Signs ED Triage Vitals  Enc Vitals Group     BP 12/24/19 1225 133/69     Pulse Rate 12/24/19 1225 82     Resp 12/24/19 1225 14     Temp 12/24/19 1225 98.3 F (36.8 C)     Temp src --      SpO2 12/24/19 1225 100 %     Weight 12/24/19 1221 130 lb (59 kg)     Height --      Head Circumference --      Peak Flow --      Pain Score 12/24/19 1221 0     Pain Loc --      Pain Edu? --      Excl. in [place]? --    No data found.  Updated Vital Signs BP 133/69 (BP Location: Right Arm)   Pulse 82   Temp 98.3 F (36.8 C)   Resp 14   Wt 130 lb (59 kg)   SpO2 100%   BMI 20.36 kg/m   Visual Acuity Right Eye Distance:   Left Eye Distance:   Bilateral Distance:    Right Eye Near:   Left Eye Near:    Bilateral Near:     Physical Exam Constitutional:      General: She is not in acute distress.    Appearance: She is well-developed.  HENT:     Head:      Comments: Three scabs to left lateral aspect of proximal nose, no active drainage, minimal redness surrounding Cardiovascular:     Rate and Rhythm: Normal rate.  Pulmonary:     Effort: Pulmonary effort is normal.     Breath sounds: Normal breath sounds and air entry.  Skin:    General: Skin is warm and dry.  Neurological:     Mental Status: She is alert. Mental status is at baseline.      UC Treatments / Results  Labs (all labs ordered are listed, but only abnormal results are displayed) Labs Reviewed  SARS CORONAVIRUS 2 (TAT 6-24 HRS)    EKG   Radiology DG Chest 2 View  Result Date: 12/24/2019 CLINICAL DATA:  Cough, congestion, runny nose and stomach pain for 4 days, history of aspiration pneumonia EXAM: CHEST - 2 VIEW COMPARISON:  06/27/2015 FINDINGS: Rotated to the LEFT. Normal heart size, mediastinal contours, and pulmonary vascularity. Atherosclerotic calcification aorta. Calcified mediastinal lymph nodes. No acute infiltrate, pleural effusion, or pneumothorax. Ovoid area of increased opacification overlying  the anterior RIGHT fourth rib, 18 x 12 mm, could represent a healing fracture of the anterior RIGHT fourth rib, calcification at the costochondral junction, or developing pulmonary nodule. Mild superior endplate compression deformities of several adjacent midthoracic vertebra. Additional density projects over the midthoracic spine on lateral view, not seen on prior study of 06/27/2015, question spur formation versus developing pleural calcification. IMPRESSION: Old granulomatous disease. New 18 x 12 mm density at the inferior RIGHT hemithorax question healing RIGHT anterior fourth rib fracture, new calcified pleural plaque or costochondral calcification, less likely pulmonary nodule; CT chest recommended to exclude pulmonary nodule. Aortic Atherosclerosis (ICD10-I70.0). Electronically Signed   By: Lavonia Dana M.D.   On: 12/24/2019 13:12    Procedures Procedures (including critical care time)  Medications Ordered in UC Medications - No data to display  Initial Impression / Assessment and Plan / UC Course  I have reviewed the triage vital signs and the nursing notes.  Pertinent labs & imaging results that were available during my care of the patient were reviewed by me and considered in my medical decision making (see chart for details).     Non toxic. Benign physical exam.  Vitals stable. No work of breathing. Noted some green nasal discharge to her mask when it was removed. History of aspiration pneumonia. Scabbed skin lesions with mild redness. Opted to provide a course of doxycycline at this time with strict return precautions. Follow up with PCP in the next week. Patient caregiver verbalized understanding and agreeable to plan.   Final Clinical Impressions(s) / UC Diagnoses   Final diagnoses:  Upper respiratory tract infection, unspecified type  Skin lesions     Discharge Instructions     Chest xray looks well today which is reassuring.  With Octivia's history, as well as with the sores  on her face, I have opted to initiate antibiotics as well, which should treat both if bacterial infection is present.  Please follow up for recheck with your primary care provider next week for recheck.  Return or go to the ER if any worsening.     ED Prescriptions    Medication Sig Dispense Auth. Provider   doxycycline (VIBRAMYCIN) 100 MG capsule Take 1 capsule (100 mg total) by mouth 2 (two) times daily for 5 days. 10 capsule Zigmund Gottron, NP     PDMP not reviewed this encounter.   Zigmund Gottron, NP 12/24/19 1553

## 2019-12-24 NOTE — Discharge Instructions (Signed)
Chest xray looks well today which is reassuring.  With Helen Patton's history, as well as with the sores on her face, I have opted to initiate antibiotics as well, which should treat both if bacterial infection is present.  Please follow up for recheck with your primary care provider next week for recheck.  Return or go to the ER if any worsening.

## 2019-12-24 NOTE — ED Triage Notes (Signed)
Patient c/o cough, congestion, runny nose and stomach pain for the past 4 days.  Caregiver denies fevers.  Patient lives in a group home.

## 2020-01-18 NOTE — Progress Notes (Signed)
Established patient visit   Patient: Helen Patton   DOB: 11/13/1960   59 y.o. Female  MRN: [id number] Visit Date: 01/19/2020  Today's healthcare provider: Wilhemena Durie, MD   Chief Complaint  Patient presents with  . Hypertension   Subjective    HPI Patient here today with care giver, Stanton Kidney from [facility] [place]. Caregiver says that she is doing well.  No complaints.  No falls.  No lightheaded spells or dizziness according to the caregiver.  Patient is in no distress. Hypertension, follow-up  BP Readings from Last 3 Encounters:  01/19/20 116/71  12/24/19 133/69  07/19/19 107/63   Wt Readings from Last 3 Encounters:  01/19/20 120 lb (54.4 kg)  12/24/19 130 lb (59 kg)  07/19/19 120 lb 6.4 oz (54.6 kg)     She was last seen for hypertension 6 months ago.  BP at that visit was 107/63. Management since that visit includes; Controlled.  New aortic murmur noted today.  We will follow this clinically. She reports excellent compliance with treatment. She is not having side effects. She is not exercising. She is adherent to low salt diet.   Outside blood pressures are stable.  She does not smoke.  Use of agents associated with hypertension: none.   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  Followed by neurology.  Adrenal nodule ([place]) From 07/19/2019-Would not aggressively address this in this patient with significant disability.   Patient Active Problem List   Diagnosis Date Noted  . Sepsis ([place]) 02/26/2019  . Postmenopausal bleeding 04/22/2018  . B12 deficiency 05/17/2016  . S/P total hip arthroplasty 08/23/2015  . Acute respiratory failure with hypoxia ([place]) 05/19/2015  . Avascular necrosis of femoral head ([place]) 03/07/2015  . Closed fracture of neck of right femur ([place]) 03/07/2015  . Anxiety 12/14/2014  . Arthralgia of hip 12/14/2014  .  Athlete's foot 12/14/2014  . Cerebral palsy ([place]) 12/14/2014  . Cognitive decline 12/14/2014  . Clinical depression 12/14/2014  . Acid reflux 12/14/2014  . HLD (hyperlipidemia) 12/14/2014  . BP (high blood pressure) 12/14/2014  . Methicillin resistant Staphylococcus aureus infection 12/14/2014  . Adiposity 12/14/2014  . Breast cancer ([place]) 11/21/2013  . Intellectual disability 11/16/2013  . Seizure ([place]) 09/30/2013   Social History   Tobacco Use  . Smoking status: Never Smoker  . Smokeless tobacco: Never Used  Vaping Use  . Vaping Use: Never used  Substance Use Topics  . Alcohol use: No  . Drug use: No   Allergies  Allergen Reactions  . Sulfa Antibiotics Other (See Comments)    "Does not know"  . Keflex [Cephalexin] Other (See Comments)    "Does not know" unknown       Medications: Outpatient Medications Prior to Visit  Medication Sig  . acetaminophen (TYLENOL) 325 MG tablet Take 2 tablets (650 mg total) by mouth every 6 (six) hours as needed.  Marland Kitchen alendronate (FOSAMAX) 70 MG tablet TAKE 1 TABLET  BY MOUTH WEEKLY EARLY MORNING BEFORE FOOD/MEDS WITH WATER. DO NOT LIE DOWN FOR 30 MINUTES  . amLODipine (NORVASC) 10 MG tablet TAKE 1 TABLET BY MOUTH AT BEDTIME  . BD ECLIPSE SYRINGE 25G X 1" 3 ML MISC FOR USE WITH B12  . BENZOYL PEROXIDE 5 % external wash USE TOPICALLY ONCE DAILY  . calcium-vitamin D (OSCAL WITH D) 500-200 MG-UNIT TABS tablet TAKE 1 TABLET BY MOUTH EVERY DAY WITH BREAKFAST  . cetirizine (ZYRTEC) 10 MG tablet Take 1 tablet (10 mg  total) by mouth daily.  . clotrimazole (LOTRIMIN) 1 % cream APPLY TOPICALLY TWICE A DAY AS NEEDED FOR RASH IN SKIN FOLDS  . coal tar (NEUTROGENA T-GEL) 0.5 % shampoo Use as directed  . cyanocobalamin (,VITAMIN B-12,) 1000 MCG/ML injection INJECT 1ML=1000MCG INTRAMUSCULARLY EVERY MONTH (B-12 SUPPLEMENT)  . diphenhydrAMINE (BENADRYL) 12.5 MG chewable tablet Chew 1 tablet (12.5 mg total) by mouth 3 (three) times daily.  Marland Kitchen docusate sodium  (COLACE) 100 MG capsule TAKE 1 CAPSULE BY MOUTH 2 TIMES PER DAY  . DULoxetine (CYMBALTA) 30 MG capsule Take 30 mg by mouth daily.  . DULoxetine (CYMBALTA) 60 MG capsule Take 1 capsule (60 mg total) by mouth daily.  Marland Kitchen ipratropium (ATROVENT) 0.03 % nasal spray Place 2 sprays into both nostrils 3 (three) times daily.  Marland Kitchen lamoTRIgine (LAMICTAL) 150 MG tablet TAKE 2 TABLETS BY MOUTH 2 TIMES PER DAY  . LORazepam (ATIVAN) 0.5 MG tablet Take 0.5 mg by mouth 2 (two) times daily. Tice a day at noon and at bedtime  . LORazepam (ATIVAN) 1 MG tablet Take 1 tablet by mouth every morning, and 1/2 tablet at noon and bedtime. (Patient taking differently: 1 mg. Take 1 tablet by mouth every morning)  . losartan (COZAAR) 100 MG tablet Take 1 tablet (100 mg total) by mouth daily.  . pantoprazole (PROTONIX) 40 MG tablet TAKE 1 TABLET BY MOUTH 2 TIMES PER DAY **DO NOT CRUSH**  . senna (SENOKOT) 8.6 MG TABS tablet Take 1 tablet (8.6 mg total) by mouth 2 (two) times daily.  Marland Kitchen terbinafine (LAMISIL) 1 % cream Use 2 times a week as needed  . triamcinolone (KENALOG) 0.025 % ointment Apply 1 application topically 2 (two) times daily as needed.  Marland Kitchen letrozole (FEMARA) 2.5 MG tablet TAKE 1 TABLET BY MOUTH ONCE DAILY *HAZARDOUS DRUG: WEAR GLOVES* (Patient not taking: Reported on 01/19/2020)  . levETIRAcetam (KEPPRA) 250 MG tablet Take 1 tablet (250 mg total) by mouth 2 (two) times daily for 2 days. Then stop. (Patient not taking: Reported on 01/19/2020)   No facility-administered medications prior to visit.    Review of Systems  Constitutional: Negative.   Respiratory: Negative.   Cardiovascular: Negative.   Gastrointestinal: Positive for abdominal pain and constipation.  Neurological: Positive for headaches.    Last CBC Lab Results  Component Value Date   WBC 10.9 (H) 03/01/2019   HGB 11.0 (L) 03/01/2019   HCT 34.8 (L) 03/01/2019   MCV 89.0 03/01/2019   MCH 28.1 03/01/2019   RDW 15.4 03/01/2019   PLT 325 03/01/2019    Last thyroid functions Lab Results  Component Value Date   TSH 1.960 12/09/2018   Last vitamin D No results found for: 25OHVITD2, 25OHVITD3, VD25OH Last vitamin B12 and Folate No results found for: VITAMINB12, FOLATE    Objective    BP 116/71 (BP Location: Right Arm, Patient Position: Sitting, Cuff Size: Normal)   Pulse 82   Temp 98.1 F (36.7 C) (Oral)   Resp 16   Ht 4\' 11"  (1.499 m)   Wt 120 lb (54.4 kg)   SpO2 95%   BMI 24.24 kg/m  BP Readings from Last 3 Encounters:  01/19/20 116/71  12/24/19 133/69  07/19/19 107/63   Wt Readings from Last 3 Encounters:  01/19/20 120 lb (54.4 kg)  12/24/19 130 lb (59 kg)  07/19/19 120 lb 6.4 oz (54.6 kg)      Physical Exam Vitals reviewed.  Constitutional:      Appearance: Normal appearance.  HENT:  Head:     Comments: Patient with congenital microcephaly    Right Ear: External ear normal.     Left Ear: External ear normal.  Cardiovascular:     Rate and Rhythm: Normal rate and regular rhythm.     Heart sounds: Murmur heard.      Comments: 2/6 early systolic murmur heard best at the right upper sternal border Pulmonary:     Breath sounds: Normal breath sounds.  Skin:    General: Skin is warm and dry.  Neurological:     Mental Status: Mental status is at baseline.  Psychiatric:        Mood and Affect: Mood normal.     Recheck blood pressure is 116/70  No results found for any visits on 01/19/20.  Assessment & Plan     1. Hypertension, unspecified type Good control.  Follow-up 4 to 6 months.  2. Need for influenza vaccination Has had Covid vaccines also. - Flu Vaccine QUAD 36+ mos IM  3. Intellectual disability Patient born with microcephaly. Medically stable  4. Seizure ([place]) No recent seizures.    Return in about 3 months (around 04/18/2020) for chronic disease f/u.         Ernesta Trabert Cranford Mon, MD  [facility] [phone] (phone) [phone] (fax)  [place]

## 2020-01-19 ENCOUNTER — Encounter: Payer: Self-pay | Admitting: Family Medicine

## 2020-01-19 ENCOUNTER — Ambulatory Visit (INDEPENDENT_AMBULATORY_CARE_PROVIDER_SITE_OTHER): Payer: Medicare Other | Admitting: Family Medicine

## 2020-01-19 ENCOUNTER — Other Ambulatory Visit: Payer: Self-pay

## 2020-01-19 VITALS — BP 116/71 | HR 82 | Temp 98.1°F | Resp 16 | Ht 59.0 in | Wt 120.0 lb

## 2020-01-19 DIAGNOSIS — I10 Essential (primary) hypertension: Secondary | ICD-10-CM

## 2020-01-19 DIAGNOSIS — F79 Unspecified intellectual disabilities: Secondary | ICD-10-CM

## 2020-01-19 DIAGNOSIS — Z23 Encounter for immunization: Secondary | ICD-10-CM | POA: Diagnosis not present

## 2020-01-19 DIAGNOSIS — R569 Unspecified convulsions: Secondary | ICD-10-CM

## 2020-01-19 MED ORDER — TERBINAFINE HCL 1 % EX CREA: TOPICAL_CREAM | CUTANEOUS | 2 refills | Status: DC

## 2020-01-19 MED ORDER — TRIAMCINOLONE ACETONIDE 0.025 % EX OINT: 1.0000 "application " | TOPICAL_OINTMENT | Freq: Two times a day (BID) | CUTANEOUS | 2 refills | Status: DC | PRN

## 2020-01-19 NOTE — Patient Instructions (Signed)

## 2020-03-14 ENCOUNTER — Other Ambulatory Visit: Payer: Self-pay | Admitting: Family Medicine

## 2020-03-14 DIAGNOSIS — Z1231 Encounter for screening mammogram for malignant neoplasm of breast: Secondary | ICD-10-CM

## 2020-04-07 ENCOUNTER — Other Ambulatory Visit: Payer: Self-pay

## 2020-04-07 ENCOUNTER — Ambulatory Visit
Admission: RE | Admit: 2020-04-07 | Discharge: 2020-04-07 | Disposition: A | Discharge status: Home / Self Care | Payer: Medicare Other | Source: Ambulatory Visit | Attending: Family Medicine | Admitting: Family Medicine

## 2020-04-07 ENCOUNTER — Ambulatory Visit
Admission: EM | Admit: 2020-04-07 | Discharge: 2020-04-07 | Disposition: A | Discharge status: Home / Self Care | Payer: Medicare Other | Attending: Family Medicine | Admitting: Family Medicine

## 2020-04-07 DIAGNOSIS — J3489 Other specified disorders of nose and nasal sinuses: Secondary | ICD-10-CM | POA: Diagnosis not present

## 2020-04-07 DIAGNOSIS — R4182 Altered mental status, unspecified: Secondary | ICD-10-CM | POA: Diagnosis not present

## 2020-04-07 DIAGNOSIS — G910 Communicating hydrocephalus: Secondary | ICD-10-CM | POA: Insufficient documentation

## 2020-04-07 DIAGNOSIS — I708 Atherosclerosis of other arteries: Secondary | ICD-10-CM | POA: Insufficient documentation

## 2020-04-07 DIAGNOSIS — S0990XA Unspecified injury of head, initial encounter: Secondary | ICD-10-CM | POA: Diagnosis not present

## 2020-04-07 DIAGNOSIS — S0003XA Contusion of scalp, initial encounter: Secondary | ICD-10-CM | POA: Diagnosis not present

## 2020-04-07 DIAGNOSIS — X58XXXA Exposure to other specified factors, initial encounter: Secondary | ICD-10-CM | POA: Diagnosis not present

## 2020-04-07 DIAGNOSIS — G319 Degenerative disease of nervous system, unspecified: Secondary | ICD-10-CM | POA: Diagnosis not present

## 2020-04-07 NOTE — Discharge Instructions (Addendum)
CT scan at [facility], [place], [place] [address]  Tylenol as needed for pain.  I will call with the CT scan results.  Take care  Dr. Lacinda Axon

## 2020-04-07 NOTE — ED Provider Notes (Signed)
[facility]    CSN: [id number] Arrival date & time: 04/07/20  1037  History   Chief Complaint Chief Complaint  Patient presents with  . Head Injury   HPI  60 year old female presents for evaluation of the above.  History limited as caregiver is not her normal caregiver.  Also patient is essentially nonverbal.  History obtained from caregiver who is present today.  History is very limited.  She states that she tripped and fell today.  Unsure why.  Fell back and hit her head.  She has been pointing to her head and seems to be uncomfortable at times.  No reports of loss of conscious.  She states that other caregivers have said that she received to have a behavioral change.  She can give me no further details regarding what this exactly means.  No vomiting.  She does have a knot on her head.  No other reported symptoms.    Past Medical History:  Diagnosis Date  . Anxiety   . Arthritis    osteoarthritis  . Breast cancer ([place]) 2015   [name]  . Cancer ([place]) 11/2013   T1c, Nx; ER/ PR +; Her 2 neg invasive mammary carcinoma left breast. Wide excision only based on Tumor Board review.   . Cataract   . Depression   . Difficulty swallowing   . Falls   . GERD (gastroesophageal reflux disease)   . Hidradenitis 2012   MRSA, on chronic topical suppression  . History of hiatal hernia   . Hyperlipidemia   . Hypertension   . Mental retardation   . MRSA (methicillin resistant staph aureus) culture positive 2012  . Osteoporosis   . Pneumonia    aspiration pneumonia  . Seizures [facility])    last seizure February 2017    Patient Active Problem List   Diagnosis Date Noted  . Sepsis ([place]) 02/26/2019  . Postmenopausal bleeding 04/22/2018  . B12 deficiency 05/17/2016  . S/P total hip arthroplasty 08/23/2015  . Acute respiratory failure with hypoxia ([place]) 05/19/2015  . 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 Closed fracture of neck of right femur ([place])  03/07/2015  . Anxiety 12/14/2014  . Arthralgia of hip 12/14/2014  . Athlete's foot 12/14/2014  . Cerebral palsy ([place]) 12/14/2014  . Cognitive decline 12/14/2014  . Clinical depression 12/14/2014  . Acid reflux 12/14/2014  . HLD (hyperlipidemia) 12/14/2014  . BP (high blood pressure) 12/14/2014  . Methicillin resistant Staphylococcus aureus infection 12/14/2014  . Adiposity 12/14/2014  . Breast cancer ([place]) 11/21/2013  . Intellectual disability 11/16/2013  . Seizure ([place]) 09/30/2013    Past Surgical History:  Procedure Laterality Date  . BREAST LUMPECTOMY Left 12/07/2013   T1b, NX, ER/PR positive, HER-2/neu not overexpressed, 7 mm.  Not candidate for chemotherapy/sentinel node/radiation based on mental retardation.  Marland Kitchen BREAST SURGERY Left 12/07/2013   T1b, Nx; ER+, PR+, her 2 not amplified. (Not candidate for chemotherapy/radiation based on mental retardation)  . broken leg    . CERVICAL ABLATION    . 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 dislocated hip    . HARDWARE REMOVAL  08/23/2015   Procedure: HARDWARE REMOVAL;  Surgeon: Dereck Leep, MD;  Location: [facility] ORS;  Service: Orthopedics;;  . TOTAL HIP REVISION Right 08/23/2015   Procedure: TOTAL HIP REVISION;  Surgeon: Dereck Leep, MD;  Location: [facility] ORS;  Service: Orthopedics;  Laterality: Right;    OB History   No obstetric history on file.      Home Medications    Prior to Admission medications   Medication Sig Start Date End Date Taking? Authorizing Provider  acetaminophen (TYLENOL) 325 MG tablet Take 2 tablets (650 mg total) by mouth every 6 (six) hours as needed. 07/14/17  Yes Jerrol Banana., MD  alendronate (FOSAMAX) 70 MG tablet TAKE 1 TABLET  BY MOUTH WEEKLY EARLY MORNING BEFORE FOOD/MEDS WITH WATER. DO NOT LIE DOWN FOR 30 MINUTES 09/03/18  Yes Jerrol Banana., MD  amLODipine (NORVASC)  10 MG tablet TAKE 1 TABLET BY MOUTH AT BEDTIME 09/03/18  Yes Jerrol Banana., MD  BD ECLIPSE SYRINGE 25G X 1" 3 ML MISC FOR USE WITH B12 10/07/19  Yes Jerrol Banana., MD  BENZOYL PEROXIDE 5 % external wash USE TOPICALLY ONCE DAILY 10/27/19  Yes Jerrol Banana., MD  calcium-vitamin D (OSCAL WITH D) 500-200 MG-UNIT TABS tablet TAKE 1 TABLET BY MOUTH EVERY DAY WITH BREAKFAST 11/03/18  Yes Jerrol Banana., MD  cetirizine (ZYRTEC) 10 MG tablet Take 1 tablet (10 mg total) by mouth daily. 12/23/18  Yes Jerrol Banana., MD  clotrimazole (LOTRIMIN) 1 % cream APPLY TOPICALLY TWICE A DAY AS NEEDED FOR RASH IN SKIN FOLDS 02/05/17  Yes Jerrol Banana., MD  coal tar (NEUTROGENA T-GEL) 0.5 % shampoo Use as directed 01/30/18  Yes Jerrol Banana., MD  cyanocobalamin (,VITAMIN B-12,) 1000 MCG/ML injection INJECT 1ML=1000MCG INTRAMUSCULARLY EVERY MONTH (B-12 SUPPLEMENT) 10/07/19  Yes Jerrol Banana., MD  diphenhydrAMINE (BENADRYL) 12.5 MG chewable tablet Chew 1 tablet (12.5 mg total) by mouth 3 (three) times daily. 04/02/18  Yes Jerrol Banana., MD  docusate sodium (COLACE) 100 MG capsule TAKE 1 CAPSULE BY MOUTH 2 TIMES PER DAY 09/03/18  Yes Jerrol Banana., MD  DULoxetine (CYMBALTA) 30 MG capsule Take 30 mg by mouth daily.   Yes [provider]  DULoxetine (CYMBALTA) 60 MG capsule Take 1 capsule (60 mg total) by mouth daily. 04/10/18  Yes Jerrol Banana., MD  ipratropium (ATROVENT) 0.03 % nasal spray Place 2 sprays into both nostrils 3 (three) times daily. 02/24/18  Yes Bacigalupo, [name], MD  lamoTRIgine (LAMICTAL) 150 MG tablet TAKE 2 TABLETS BY MOUTH 2 TIMES PER DAY 09/03/18  Yes Jerrol Banana., MD  LORazepam (ATIVAN) 1 MG tablet Take 1 tablet by mouth every morning, and 1/2 tablet at noon and bedtime. Patient taking differently: 1 mg. Take 1 tablet by mouth every morning 12/03/18  Yes Jerrol Banana., MD  losartan  (COZAAR) 50 MG tablet Take 50 mg by mouth 2 (two) times daily. 03/06/20  Yes [provider]  pantoprazole (PROTONIX) 40 MG tablet TAKE 1 TABLET BY MOUTH 2 TIMES PER DAY **DO NOT CRUSH** 12/23/18  Yes Jerrol Banana., MD  senna (SENOKOT) 8.6 MG TABS tablet Take 1 tablet (8.6 mg total) by mouth 2 (two) times daily. 03/01/19  Yes Lavina Hamman, MD  terbinafine (LAMISIL) 1 % cream Use 2 times a week as needed 01/19/20  Yes Jerrol Banana., MD  triamcinolone (KENALOG) 0.025 % ointment Apply 1 application topically 2 (two) times daily as needed. 01/19/20  Yes Jerrol Banana., MD  letrozole [facility]) 2.5 MG tablet TAKE 1 TABLET BY MOUTH ONCE DAILY *HAZARDOUS  DRUG: WEAR GLOVES* Patient not taking: No sig reported 09/25/19   Jerrol Banana., MD  levETIRAcetam (KEPPRA) 250 MG tablet Take 1 tablet (250 mg total) by mouth 2 (two) times daily for 2 days. Then stop. Patient not taking: No sig reported 03/01/19 03/03/19  Lavina Hamman, MD  LORazepam (ATIVAN) 0.5 MG tablet Take 0.5 mg by mouth 2 (two) times daily. Tice a day at noon and at bedtime    [provider]    Family History Family History  Problem Relation Age of Onset  . Diabetes Father   . Parkinson's disease Father   . Neuropathy Mother   . Arthritis Mother   . Diabetes Mother   . Autoimmune disease Brother        AIDS  . Cancer Maternal Aunt 63       breast  . Breast cancer Maternal Aunt   . Hypertension Maternal Grandmother   . Cancer Paternal Grandmother     Social History Social History   Tobacco Use  . Smoking status: Never Smoker  . Smokeless tobacco: Never Used  Vaping Use  . Vaping Use: Never used  Substance Use Topics  . Alcohol use: No  . Drug use: No     Allergies   Sulfa antibiotics and Keflex [cephalexin]   Review of Systems Review of Systems As Per HPI. Obtained from caregiver.  Physical Exam Triage Vital Signs ED Triage Vitals  Enc Vitals Group     BP  04/07/20 1052 127/66     Pulse Rate 04/07/20 1052 76     Resp 04/07/20 1052 18     Temp 04/07/20 1052 97.8 F (36.6 C)     Temp Source 04/07/20 1052 Oral     SpO2 04/07/20 1052 100 %     Weight 04/07/20 1049 112 lb (50.8 kg)     Height 04/07/20 1049 5' (1.524 m)     Head Circumference --      Peak Flow --      Pain Score 04/07/20 1049 0     Pain Loc --      Pain Edu? --      Excl. in [place]? --    Updated Vital Signs BP 127/66 (BP Location: Left Arm)   Pulse 76   Temp 97.8 F (36.6 C) (Oral)   Resp 18   Ht 5' (1.524 m)   Wt 50.8 kg   SpO2 100%   BMI 21.87 kg/m   Visual Acuity Right Eye Distance:   Left Eye Distance:   Bilateral Distance:    Right Eye Near:   Left Eye Near:    Bilateral Near:     Physical Exam Vitals and nursing note reviewed.  Constitutional:      General: She is not in acute distress.    Appearance: She is not ill-appearing.  HENT:     Head:      Comments: Hematoma noted at labelled location. Eyes:     General:        Right eye: No discharge.        Left eye: No discharge.     Conjunctiva/sclera: Conjunctivae normal.  Cardiovascular:     Rate and Rhythm: Normal rate and regular rhythm.     Heart sounds: No murmur heard.   Pulmonary:     Effort: Pulmonary effort is normal.     Breath sounds: Normal breath sounds. No wheezing, rhonchi or rales.  Neurological:     General: No focal  deficit present.     Mental Status: She is alert.    UC Treatments / Results  Labs (all labs ordered are listed, but only abnormal results are displayed) Labs Reviewed - No data to display  EKG   Radiology CT HEAD WO CONTRAST  Result Date: 04/07/2020 CLINICAL DATA:  See something EXAM: CT HEAD WITHOUT CONTRAST TECHNIQUE: Contiguous axial images were obtained from the base of the skull through the vertex without intravenous contrast. COMPARISON:  September 26, 2014 FINDINGS: Brain: There is generalized ventricular prominence with prominence most notable in  the posterior horns of each lateral ventricle, stable. There is also sulcal atrophy with ventricles somewhat larger in proportion than sulci, a stable appearance compared to the 2016 study. There is invagination of CSF into the sella, a finding that was present previously as well. There is no intracranial mass, hemorrhage, extra-axial fluid collection, or midline shift. There is stable decreased attenuation in the centra semiovale bilaterally. No evident acute infarct. Vascular: No hyperdense vessels. There is calcification in each carotid siphon region. Skull: Bony calvarium appears intact. There is a superior right frontal scalp hematoma. Sinuses/Orbits: There is opacification of multiple right-sided ethmoid air cells. Visualized orbits appear symmetric bilaterally. Other: Mastoid air cells are clear. There is debris in the right external auditory canal. IMPRESSION: 1. Stable atrophy with questionable concomitant communicating hydrocephalus. Ventricles and sulci appear stable compared to the previous study. 2. 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 Clinical significance of this finding uncertain. 3. Mild decreased attenuation in the periventricular white matter, likely representing mild periventricular small vessel disease. No acute infarct. No mass or hemorrhage. 4.  Superior right frontal scalp hematoma.  No underlying fracture. 5.  Foci of arterial vascular calcification noted. 6.  Opacification of several ethmoid air cells on the right. 7.  Probable cerumen in the right external auditory canal. These results will be called to the ordering clinician or representative by the Radiology Department at the imaging location. Electronically Signed   By: Lowella Grip III M.D.   On: 04/07/2020 14:14    Procedures Procedures (including critical care time)  Medications Ordered in UC Medications - No data to display  Initial Impression / Assessment and Plan / UC  Course  I have reviewed the triage vital signs and the nursing notes.  Pertinent labs & imaging results that were available during my care of the patient were reviewed by me and considered in my medical decision making (see chart for details).    60 year old female presents with a head injury. CT obtained and revealed stable findings and hematoma. No acute intracranial process/abnormality. Tylenol as needed. Supportive care.   Final Clinical Impressions(s) / UC Diagnoses   Final diagnoses:  Injury of head, initial encounter     Discharge Instructions     CT scan at 1255 - [place], [place], [place] [id number]  Tylenol as needed for pain.  I will call with the CT scan results.  Take care  Dr. Lacinda Axon    ED Prescriptions    None     PDMP not reviewed this encounter.   Coral Spikes, [place] 04/07/20 1452

## 2020-04-07 NOTE — ED Triage Notes (Signed)
Pt presents with caregiver and c/o trip and fall while at a day program today. Caregiver reports pt has knot on back of her head, also denies LOC or other injuries. Day program staff did report pt behavior seemed to change some after incident.

## 2020-04-11 ENCOUNTER — Other Ambulatory Visit: Payer: Self-pay | Admitting: Family Medicine

## 2020-04-11 ENCOUNTER — Ambulatory Visit (INDEPENDENT_AMBULATORY_CARE_PROVIDER_SITE_OTHER): Payer: Medicare Other | Admitting: Family Medicine

## 2020-04-11 ENCOUNTER — Other Ambulatory Visit: Payer: Self-pay

## 2020-04-11 ENCOUNTER — Encounter: Payer: Self-pay | Admitting: Family Medicine

## 2020-04-11 VITALS — BP 117/56 | HR 82 | Temp 97.9°F | Resp 16 | Wt 118.0 lb

## 2020-04-11 DIAGNOSIS — Q02 Microcephaly: Secondary | ICD-10-CM

## 2020-04-11 DIAGNOSIS — E785 Hyperlipidemia, unspecified: Secondary | ICD-10-CM | POA: Diagnosis not present

## 2020-04-11 DIAGNOSIS — F79 Unspecified intellectual disabilities: Secondary | ICD-10-CM

## 2020-04-11 DIAGNOSIS — G808 Other cerebral palsy: Secondary | ICD-10-CM | POA: Diagnosis not present

## 2020-04-11 DIAGNOSIS — I10 Essential (primary) hypertension: Secondary | ICD-10-CM

## 2020-04-11 DIAGNOSIS — E278 Other specified disorders of adrenal gland: Secondary | ICD-10-CM | POA: Diagnosis not present

## 2020-04-11 DIAGNOSIS — Z170 Estrogen receptor positive status [ER+]: Secondary | ICD-10-CM

## 2020-04-11 DIAGNOSIS — C50512 Malignant neoplasm of lower-outer quadrant of left female breast: Secondary | ICD-10-CM | POA: Diagnosis not present

## 2020-04-11 DIAGNOSIS — Z289 Immunization not carried out for unspecified reason: Secondary | ICD-10-CM

## 2020-04-11 DIAGNOSIS — R569 Unspecified convulsions: Secondary | ICD-10-CM

## 2020-04-11 MED ORDER — SHINGRIX 50 MCG/0.5ML IM SUSR: 0.5000 mL | Freq: Once | INTRAMUSCULAR | 0 refills | Status: AC

## 2020-04-11 NOTE — Telephone Encounter (Signed)
Requested medication (s) are due for refill today: -  Requested medication (s) are on the active medication list: historical med  Last refill:  - was discharged no med was prescribed  Future visit scheduled: yes  Notes to clinic:  historical provider   Requested Prescriptions  Pending Prescriptions Disp Refills   losartan (COZAAR) 50 MG tablet [Pharmacy Med Name: Losartan Potassium 50 MG Tablet] 14 tablet 10    Sig: TAKE 2 TABLETS (100 MG) BY MOUTH ONCE DAILY      Cardiovascular:  Angiotensin Receptor Blockers Failed - 04/11/2020  3:32 PM      Failed - Cr in normal range and within 180 days    Creat  Date Value Ref Range Status  11/06/2016 0.78 0.50 - 1.05 mg/dL Final    Comment:    For patients >40 years of age, the reference limit for Creatinine is approximately 13% higher for people identified as African-American. .    Creatinine, Ser  Date Value Ref Range Status  03/01/2019 0.61 0.44 - 1.00 mg/dL Final          Failed - K in normal range and within 180 days    Potassium  Date Value Ref Range Status  03/01/2019 3.0 (L) 3.5 - 5.1 mmol/L Final  01/08/2013 4.0 3.5 - 5.1 mmol/L Final          Passed - Patient is not pregnant      Passed - Last BP in normal range    BP Readings from Last 1 Encounters:  04/11/20 (!) 117/56          Passed - Valid encounter within last 6 months    Recent Outpatient Visits           Today Hypertension, unspecified type   [facility] Jerrol Banana., MD   2 months ago Hypertension, unspecified type   [facility] Jerrol Banana., MD   8 months ago Hypertension, unspecified type   [facility] Jerrol Banana., MD   1 year ago Pneumonia due to infectious organism, unspecified laterality, unspecified part of lung   [facility] Jerrol Banana., MD   1 year ago 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 Jerrol Banana., MD        Future Appointments             In 6 months Jerrol Banana., MD [facility], [place]

## 2020-04-11 NOTE — Progress Notes (Signed)
Established patient visit   Patient: Helen [place]ton   DOB: 10-30-1960   60 y.o. Female  MRN: [id number] Visit Date: 04/11/2020  Today's healthcare provider: Wilhemena Durie, MD   Chief Complaint  Patient presents with  . Hypertension  . Fall   Subjective    HPI  Patient comes in today for routine follow-up.  She is doing well.  She did have a fall last week but is recovered nicely.  It is time for routine labs.  Her lipids were high a year ago.  We will check those again next year.  She is not fasting today. Hypertension, follow-up  BP Readings from Last 3 Encounters:  04/11/20 (!) 117/56  04/07/20 127/66  01/19/20 116/71   Wt Readings from Last 3 Encounters:  04/11/20 118 lb (53.5 kg)  04/07/20 112 lb (50.8 kg)  01/19/20 120 lb (54.4 kg)     She was last seen for hypertension 3 months ago.  BP at that visit was 116/71. Management since that visit includes; Good control. She reports good compliance with treatment. She is not having side effects.  She is not exercising. She is not adherent to low salt diet.   Outside blood pressures are checked daily.  She does not smoke.  Use of agents associated with hypertension: none.   Intellectual disability From 01/19/2020-Patient born with microcephaly. Medically stable.  Seizure ([place]) From 01/19/2020-No recent seizures.        Medications: Outpatient Medications Prior to Visit  Medication Sig  . alendronate (FOSAMAX) 70 MG tablet TAKE 1 TABLET  BY MOUTH WEEKLY EARLY MORNING BEFORE FOOD/MEDS WITH WATER. DO NOT LIE DOWN FOR 30 MINUTES  . amLODipine (NORVASC) 10 MG tablet TAKE 1 TABLET BY MOUTH AT BEDTIME  . BD ECLIPSE SYRINGE 25G X 1" 3 ML MISC FOR USE WITH B12  . BENZOYL PEROXIDE 5 % external wash USE TOPICALLY ONCE DAILY  . calcium-vitamin D (OSCAL WITH D) 500-200 MG-UNIT TABS tablet TAKE 1 TABLET BY MOUTH EVERY DAY WITH BREAKFAST  . cetirizine (ZYRTEC) 10 MG tablet Take 1 tablet (10 mg total) by  mouth daily.  . clotrimazole (LOTRIMIN) 1 % cream APPLY TOPICALLY TWICE A DAY AS NEEDED FOR RASH IN SKIN FOLDS  . coal tar (NEUTROGENA T-GEL) 0.5 % shampoo Use as directed  . cyanocobalamin (,VITAMIN B-12,) 1000 MCG/ML injection INJECT 1ML=1000MCG INTRAMUSCULARLY EVERY MONTH (B-12 SUPPLEMENT)  . diphenhydrAMINE (BENADRYL) 12.5 MG chewable tablet Chew 1 tablet (12.5 mg total) by mouth 3 (three) times daily.  Marland Kitchen docusate sodium (COLACE) 100 MG capsule TAKE 1 CAPSULE BY MOUTH 2 TIMES PER DAY  . DULoxetine (CYMBALTA) 30 MG capsule Take 30 mg by mouth daily.  . DULoxetine (CYMBALTA) 60 MG capsule Take 1 capsule (60 mg total) by mouth daily.  Marland Kitchen ipratropium (ATROVENT) 0.03 % nasal spray Place 2 sprays into both nostrils 3 (three) times daily.  Marland Kitchen lamoTRIgine (LAMICTAL) 150 MG tablet TAKE 2 TABLETS BY MOUTH 2 TIMES PER DAY  . LORazepam (ATIVAN) 0.5 MG tablet Take 0.5 mg by mouth 2 (two) times daily. Tice a day at noon and at bedtime  . LORazepam (ATIVAN) 1 MG tablet Take 1 tablet by mouth every morning, and 1/2 tablet at noon and bedtime. (Patient taking differently: 1 mg. Take 1 tablet by mouth every morning)  . losartan (COZAAR) 50 MG tablet Take 50 mg by mouth 2 (two) times daily.  . pantoprazole (PROTONIX) 40 MG tablet TAKE 1 TABLET BY MOUTH 2  TIMES PER DAY **DO NOT CRUSH**  . senna (SENOKOT) 8.6 MG TABS tablet Take 1 tablet (8.6 mg total) by mouth 2 (two) times daily.  Marland Kitchen terbinafine (LAMISIL) 1 % cream Use 2 times a week as needed  . triamcinolone (KENALOG) 0.025 % ointment Apply 1 application topically 2 (two) times daily as needed.  Marland Kitchen acetaminophen (TYLENOL) 325 MG tablet Take 2 tablets (650 mg total) by mouth every 6 (six) hours as needed.  Marland Kitchen letrozole (FEMARA) 2.5 MG tablet TAKE 1 TABLET BY MOUTH ONCE DAILY *HAZARDOUS DRUG: WEAR GLOVES* (Patient not taking: No sig reported)  . levETIRAcetam (KEPPRA) 250 MG tablet Take 1 tablet (250 mg total) by mouth 2 (two) times daily for 2 days. Then stop.  (Patient not taking: No sig reported)   No facility-administered medications prior to visit.    Review of Systems  Constitutional: Negative for appetite change, chills, fatigue and fever.  Respiratory: Negative for chest tightness and shortness of breath.   Cardiovascular: Negative for chest pain and palpitations.  Gastrointestinal: Negative for abdominal pain, nausea and vomiting.  Neurological: Negative for dizziness and weakness.        Objective    BP (!) 117/56   Pulse 82   Temp 97.9 F (36.6 C)   Resp 16   Wt 118 lb (53.5 kg)   BMI 23.05 kg/m  BP Readings from Last 3 Encounters:  04/11/20 (!) 117/56  04/07/20 127/66  01/19/20 116/71   Wt Readings from Last 3 Encounters:  04/11/20 118 lb (53.5 kg)  04/07/20 112 lb (50.8 kg)  01/19/20 120 lb (54.4 kg)       Physical Exam Vitals reviewed.  Constitutional:      Appearance: Normal appearance.  HENT:     Head:     Comments: Patient with congenital microcephaly    Right Ear: External ear normal.     Left Ear: External ear normal.  Cardiovascular:     Rate and Rhythm: Normal rate and regular rhythm.     Heart sounds: Murmur heard.      Comments: 2/6 early systolic murmur heard best at the right upper sternal border Pulmonary:     Breath sounds: Normal breath sounds.  Skin:    General: Skin is warm and dry.  Neurological:     Mental Status: Mental status is at baseline.  Psychiatric:        Mood and Affect: Mood normal.        Behavior: Behavior normal.       No results found for any visits on 04/11/20.  Assessment & Plan     1. Hypertension, unspecified type Good control.  Follow-up in 6 months for review of all problems and exam - Comprehensive metabolic panel - TSH  2. Microcephaly [facility]) Patient very limited in understanding of any medical care. We will do anything to keep her comfortable but would avoid any elective interventional issues  3. Other cerebral palsy ([place])   4. Intellectual  disability   5. Seizure [facility]) May need Lamictal levels  6. Hyperlipidemia, unspecified hyperlipidemia type  - CBC with Differential/Platelet  7. Prescription for Shingrix. Vaccine not administered in office.   - Zoster Vaccine Adjuvanted [facility]) injection; Inject 0.5 mLs into the muscle once for 1 dose.  Dispense: 0.5 mL; Refill: 0  8. Malignant neoplasm of lower-outer quadrant of left breast of female, estrogen receptor positive ([place])   9. 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 10.  Recent  fall CT negative for any acute process. No follow-ups on file.      I, Wilhemena Durie, MD, have reviewed all documentation for this visit. The documentation on 04/11/20 for the exam, diagnosis, procedures, and orders are all accurate and complete.    Nickole Adamek Cranford Mon, MD  [facility] [phone] (phone) [phone] (fax)  [place]

## 2020-04-12 LAB — COMPREHENSIVE METABOLIC PANEL
ALT: 10 IU/L (ref 0–32)
AST: 13 IU/L (ref 0–40)
Albumin/Globulin Ratio: 2 (ref 1.2–2.2)
Albumin: 4.3 g/dL (ref 3.8–4.8)
Alkaline Phosphatase: 77 IU/L (ref 44–121)
BUN/Creatinine Ratio: 19 (ref 12–28)
BUN: 16 mg/dL (ref 8–27)
Bilirubin Total: 0.3 mg/dL (ref 0.0–1.2)
CO2: 25 mmol/L (ref 20–29)
Calcium: 9.8 mg/dL (ref 8.7–10.3)
Chloride: 98 mmol/L (ref 96–106)
Creatinine, Ser: 0.83 mg/dL (ref 0.57–1.00)
Globulin, Total: 2.1 g/dL (ref 1.5–4.5)
Glucose: 89 mg/dL (ref 65–99)
Potassium: 4.1 mmol/L (ref 3.5–5.2)
Sodium: 141 mmol/L (ref 134–144)
Total Protein: 6.4 g/dL (ref 6.0–8.5)
eGFR: 80 mL/min/{1.73_m2} (ref >59)

## 2020-04-12 LAB — CBC WITH DIFFERENTIAL/PLATELET
Basophils Absolute: 0 10*3/uL (ref 0.0–0.2)
Basos: 0 %
EOS (ABSOLUTE): 0.1 10*3/uL (ref 0.0–0.4)
Eos: 1 %
Hematocrit: 34.1 % (ref 34.0–46.6)
Hemoglobin: 11.6 g/dL (ref 11.1–15.9)
Immature Grans (Abs): 0 10*3/uL (ref 0.0–0.1)
Immature Granulocytes: 0 %
Lymphocytes Absolute: 2.2 10*3/uL (ref 0.7–3.1)
Lymphs: 33 %
MCH: 30.1 pg (ref 26.6–33.0)
MCHC: 34 g/dL (ref 31.5–35.7)
MCV: 88 fL (ref 79–97)
Monocytes Absolute: 0.7 10*3/uL (ref 0.1–0.9)
Monocytes: 10 %
Neutrophils Absolute: 3.7 10*3/uL (ref 1.4–7.0)
Neutrophils: 56 %
Platelets: 317 10*3/uL (ref 150–450)
RBC: 3.86 x10E6/uL (ref 3.77–5.28)
RDW: 12.1 % (ref 11.7–15.4)
WBC: 6.7 10*3/uL (ref 3.4–10.8)

## 2020-04-12 LAB — TSH: TSH: 1.6 u[IU]/mL (ref 0.450–4.500)

## 2020-05-12 DIAGNOSIS — M19072 Primary osteoarthritis, left ankle and foot: Secondary | ICD-10-CM | POA: Diagnosis not present

## 2020-05-12 DIAGNOSIS — M79672 Pain in left foot: Secondary | ICD-10-CM | POA: Diagnosis not present

## 2020-05-12 DIAGNOSIS — L03032 Cellulitis of left toe: Secondary | ICD-10-CM | POA: Diagnosis not present

## 2020-05-12 DIAGNOSIS — M25572 Pain in left ankle and joints of left foot: Secondary | ICD-10-CM | POA: Diagnosis not present

## 2020-05-19 ENCOUNTER — Telehealth: Payer: Self-pay

## 2020-05-19 NOTE — Telephone Encounter (Signed)
Not sure what they are needing from [place]. Unable to contact as number was not listed.

## 2020-05-19 NOTE — Telephone Encounter (Signed)
Copied from [place] [id number]. Topic: General - Other >> May 19, 2020 11:13 AM Tessa Lerner A wrote: Reason for CRM: Kizzy with [facility] after hours clinic has made contact regarding patient's visit to their facility on 05/12/20  [facility] is looking for approval from Dr. Rosanna Randy for the visit due to patient's insurance  Please contact to further advise when possible

## 2020-05-24 ENCOUNTER — Ambulatory Visit: Payer: Medicare Other | Admitting: Family Medicine

## 2020-05-30 ENCOUNTER — Ambulatory Visit: Payer: Medicare Other | Admitting: Family Medicine

## 2020-05-31 ENCOUNTER — Ambulatory Visit: Payer: Medicare Other | Admitting: Family Medicine

## 2020-05-31 NOTE — Progress Notes (Deleted)
Established patient visit   Patient: Helen Patton   DOB: Jan 04, 1961   60 y.o. Female  MRN: [id number] Visit Date: 05/31/2020  Today's healthcare provider: Wilhemena Durie, MD   No chief complaint on file.  Subjective    Foot Injury     ***  {Show patient history (optional):23778::" "}   Medications: Outpatient Medications Prior to Visit  Medication Sig  . acetaminophen (TYLENOL) 325 MG tablet Take 2 tablets (650 mg total) by mouth every 6 (six) hours as needed.  Marland Kitchen alendronate (FOSAMAX) 70 MG tablet TAKE 1 TABLET  BY MOUTH WEEKLY EARLY MORNING BEFORE FOOD/MEDS WITH WATER. DO NOT LIE DOWN FOR 30 MINUTES  . amLODipine (NORVASC) 10 MG tablet TAKE 1 TABLET BY MOUTH AT BEDTIME  . BD ECLIPSE SYRINGE 25G X 1" 3 ML MISC FOR USE WITH B12  . BENZOYL PEROXIDE 5 % external wash USE TOPICALLY ONCE DAILY  . calcium-vitamin D (OSCAL WITH D) 500-200 MG-UNIT TABS tablet TAKE 1 TABLET BY MOUTH EVERY DAY WITH BREAKFAST  . cetirizine (ZYRTEC) 10 MG tablet Take 1 tablet (10 mg total) by mouth daily.  . clotrimazole (LOTRIMIN) 1 % cream APPLY TOPICALLY TWICE A DAY AS NEEDED FOR RASH IN SKIN FOLDS  . coal tar (NEUTROGENA T-GEL) 0.5 % shampoo Use as directed  . cyanocobalamin (,VITAMIN B-12,) 1000 MCG/ML injection INJECT 1ML=1000MCG INTRAMUSCULARLY EVERY MONTH (B-12 SUPPLEMENT)  . diphenhydrAMINE (BENADRYL) 12.5 MG chewable tablet Chew 1 tablet (12.5 mg total) by mouth 3 (three) times daily.  Marland Kitchen docusate sodium (COLACE) 100 MG capsule TAKE 1 CAPSULE BY MOUTH 2 TIMES PER DAY  . DULoxetine (CYMBALTA) 30 MG capsule Take 30 mg by mouth daily.  . DULoxetine (CYMBALTA) 60 MG capsule Take 1 capsule (60 mg total) by mouth daily.  Marland Kitchen ipratropium (ATROVENT) 0.03 % nasal spray Place 2 sprays into both nostrils 3 (three) times daily.  Marland Kitchen lamoTRIgine (LAMICTAL) 150 MG tablet TAKE 2 TABLETS BY MOUTH 2 TIMES PER DAY  . letrozole (FEMARA) 2.5 MG tablet TAKE 1 TABLET BY MOUTH ONCE DAILY *HAZARDOUS DRUG:  WEAR GLOVES* (Patient not taking: No sig reported)  . levETIRAcetam (KEPPRA) 250 MG tablet Take 1 tablet (250 mg total) by mouth 2 (two) times daily for 2 days. Then stop. (Patient not taking: No sig reported)  . LORazepam (ATIVAN) 0.5 MG tablet Take 0.5 mg by mouth 2 (two) times daily. Tice a day at noon and at bedtime  . LORazepam (ATIVAN) 1 MG tablet Take 1 tablet by mouth every morning, and 1/2 tablet at noon and bedtime. (Patient taking differently: 1 mg. Take 1 tablet by mouth every morning)  . losartan (COZAAR) 50 MG tablet TAKE 2 TABLETS (100 MG) BY MOUTH ONCE DAILY  . pantoprazole (PROTONIX) 40 MG tablet TAKE 1 TABLET BY MOUTH 2 TIMES PER DAY **DO NOT CRUSH**  . senna (SENOKOT) 8.6 MG TABS tablet Take 1 tablet (8.6 mg total) by mouth 2 (two) times daily.  Marland Kitchen terbinafine (LAMISIL) 1 % cream Use 2 times a week as needed  . triamcinolone (KENALOG) 0.025 % ointment Apply 1 application topically 2 (two) times daily as needed.   No facility-administered medications prior to visit.    Review of Systems  Constitutional: Negative for appetite change, chills, fatigue and fever.  Respiratory: Negative for chest tightness and shortness of breath.   Cardiovascular: Negative for chest pain and palpitations.  Gastrointestinal: Negative for abdominal pain, nausea and vomiting.  Neurological: Negative for dizziness and weakness.    {  Labs  Heme  Chem  Endocrine  Serology  Results Review (optional):23779::" "}   Objective    There were no vitals taken for this visit. {Show previous vital signs (optional):23777::" "}   Physical Exam  ***  No results found for any visits on 05/31/20.  Assessment & Plan     ***  No follow-ups on file.      {provider attestation***:1}   Wilhemena Durie, MD  [facility] [phone] (phone) [phone] (fax)  [place]

## 2020-06-01 ENCOUNTER — Encounter: Payer: Self-pay | Admitting: Family Medicine

## 2020-06-01 ENCOUNTER — Ambulatory Visit (INDEPENDENT_AMBULATORY_CARE_PROVIDER_SITE_OTHER): Payer: Medicare Other | Admitting: Family Medicine

## 2020-06-01 ENCOUNTER — Encounter: Payer: Self-pay | Admitting: *Deleted

## 2020-06-01 ENCOUNTER — Other Ambulatory Visit: Payer: Self-pay

## 2020-06-01 VITALS — BP 113/68 | HR 81 | Temp 97.6°F | Resp 18 | Ht 65.0 in | Wt 116.2 lb

## 2020-06-01 DIAGNOSIS — R569 Unspecified convulsions: Secondary | ICD-10-CM

## 2020-06-01 DIAGNOSIS — M7989 Other specified soft tissue disorders: Secondary | ICD-10-CM | POA: Diagnosis not present

## 2020-06-01 DIAGNOSIS — M79675 Pain in left toe(s): Secondary | ICD-10-CM | POA: Diagnosis not present

## 2020-06-01 DIAGNOSIS — F79 Unspecified intellectual disabilities: Secondary | ICD-10-CM

## 2020-06-01 DIAGNOSIS — C50512 Malignant neoplasm of lower-outer quadrant of left female breast: Secondary | ICD-10-CM | POA: Diagnosis not present

## 2020-06-01 DIAGNOSIS — I10 Essential (primary) hypertension: Secondary | ICD-10-CM

## 2020-06-01 DIAGNOSIS — G808 Other cerebral palsy: Secondary | ICD-10-CM

## 2020-06-01 DIAGNOSIS — Z170 Estrogen receptor positive status [ER+]: Secondary | ICD-10-CM | POA: Diagnosis not present

## 2020-06-01 NOTE — Progress Notes (Signed)
I,Helen Patton,acting as a scribe for Helen Durie, MD.,have documented all relevant documentation on the behalf of Helen Durie, MD,as directed by  Helen Durie, MD while in the presence of Helen Durie, MD.   Established patient visit   Patient: Helen Patton   DOB: Sep 01, 1960   60 y.o. Female  MRN: [id number] Visit Date: 06/01/2020  Today's healthcare provider: Wilhemena Durie, MD   Chief Complaint  Patient presents with  . Foot Injury   Subjective    HPI  Patient has had swelling in her left foot for around 2 weeks. The facility she stay at are not sure it patient was injured or not. There is some redness in patient's left toes. Patient is have pain when walking on left foot.  She is able to walk into the office without difficulty.  Overall Helen Patton is doing well.     Medications: Outpatient Medications Prior to Visit  Medication Sig  . acetaminophen (TYLENOL) 325 MG tablet Take 2 tablets (650 mg total) by mouth every 6 (six) hours as needed.  Marland Kitchen alendronate (FOSAMAX) 70 MG tablet TAKE 1 TABLET  BY MOUTH WEEKLY EARLY MORNING BEFORE FOOD/MEDS WITH WATER. DO NOT LIE DOWN FOR 30 MINUTES  . amLODipine (NORVASC) 10 MG tablet TAKE 1 TABLET BY MOUTH AT BEDTIME  . BD ECLIPSE SYRINGE 25G X 1" 3 ML MISC FOR USE WITH B12  . BENZOYL PEROXIDE 5 % external wash USE TOPICALLY ONCE DAILY  . calcium-vitamin D (OSCAL WITH D) 500-200 MG-UNIT TABS tablet TAKE 1 TABLET BY MOUTH EVERY DAY WITH BREAKFAST  . cetirizine (ZYRTEC) 10 MG tablet Take 1 tablet (10 mg total) by mouth daily.  . clotrimazole (LOTRIMIN) 1 % cream APPLY TOPICALLY TWICE A DAY AS NEEDED FOR RASH IN SKIN FOLDS  . coal tar (NEUTROGENA T-GEL) 0.5 % shampoo Use as directed  . cyanocobalamin (,VITAMIN B-12,) 1000 MCG/ML injection INJECT 1ML=1000MCG INTRAMUSCULARLY EVERY MONTH (B-12 SUPPLEMENT)  . diphenhydrAMINE (BENADRYL) 12.5 MG chewable tablet Chew 1 tablet (12.5 mg total) by mouth 3 (three)  times daily.  Marland Kitchen docusate sodium (COLACE) 100 MG capsule TAKE 1 CAPSULE BY MOUTH 2 TIMES PER DAY  . DULoxetine (CYMBALTA) 30 MG capsule Take 30 mg by mouth daily.  . DULoxetine (CYMBALTA) 60 MG capsule Take 1 capsule (60 mg total) by mouth daily.  Marland Kitchen ipratropium (ATROVENT) 0.03 % nasal spray Place 2 sprays into both nostrils 3 (three) times daily.  Marland Kitchen lamoTRIgine (LAMICTAL) 150 MG tablet TAKE 2 TABLETS BY MOUTH 2 TIMES PER DAY  . LORazepam (ATIVAN) 0.5 MG tablet Take 0.5 mg by mouth 2 (two) times daily. Tice a day at noon and at bedtime  . LORazepam (ATIVAN) 1 MG tablet Take 1 tablet by mouth every morning, and 1/2 tablet at noon and bedtime. (Patient taking differently: 1 mg. Take 1 tablet by mouth every morning)  . losartan (COZAAR) 50 MG tablet TAKE 2 TABLETS (100 MG) BY MOUTH ONCE DAILY  . pantoprazole (PROTONIX) 40 MG tablet TAKE 1 TABLET BY MOUTH 2 TIMES PER DAY **DO NOT CRUSH**  . senna (SENOKOT) 8.6 MG TABS tablet Take 1 tablet (8.6 mg total) by mouth 2 (two) times daily.  Marland Kitchen terbinafine (LAMISIL) 1 % cream Use 2 times a week as needed  . triamcinolone (KENALOG) 0.025 % ointment Apply 1 application topically 2 (two) times daily as needed.  Marland Kitchen letrozole (FEMARA) 2.5 MG tablet TAKE 1 TABLET BY MOUTH ONCE DAILY *HAZARDOUS DRUG:  WEAR GLOVES* (Patient not taking: No sig reported)  . levETIRAcetam (KEPPRA) 250 MG tablet Take 1 tablet (250 mg total) by mouth 2 (two) times daily for 2 days. Then stop. (Patient not taking: No sig reported)   No facility-administered medications prior to visit.    Review of Systems  Constitutional: Negative for appetite change, chills, fatigue and fever.  Respiratory: Negative for chest tightness and shortness of breath.   Cardiovascular: Negative for chest pain and palpitations.  Gastrointestinal: Negative for abdominal pain, nausea and vomiting.  Neurological: Negative for dizziness and weakness.        Objective    BP 113/68 (BP Location: Left Arm, Patient  Position: Sitting, Cuff Size: Normal)   Pulse 81   Temp 97.6 F (36.4 C) (Oral)   Resp 18   Ht 5\' 5"  (1.651 m)   Wt 116 lb 3.2 oz (52.7 kg)   SpO2 94%   BMI 19.34 kg/m  BP Readings from Last 3 Encounters:  06/01/20 113/68  04/11/20 (!) 117/56  04/07/20 127/66   Wt Readings from Last 3 Encounters:  06/01/20 116 lb 3.2 oz (52.7 kg)  04/11/20 118 lb (53.5 kg)  04/07/20 112 lb (50.8 kg)       Physical Exam Vitals reviewed.  Constitutional:      Appearance: Normal appearance.  HENT:     Head:     Comments: Patient with congenital microcephaly    Right Ear: External ear normal.     Left Ear: External ear normal.  Cardiovascular:     Rate and Rhythm: Normal rate and regular rhythm.     Heart sounds: Murmur heard.      Comments: 2/6 early systolic murmur heard best at the right upper sternal border Pulmonary:     Breath sounds: Normal breath sounds.  Skin:    General: Skin is warm and dry.     Comments: She has toenails that are thinned out and deformed as she is aging.  She has some calluses around the toes apparently  Neurological:     Mental Status: Mental status is at baseline.  Psychiatric:        Mood and Affect: Mood normal.        Behavior: Behavior normal.       No results found for any visits on 06/01/20.  Assessment & Plan      1. Pain and swelling of toe of left foot She is able to ambulate without difficulty but due to persistence will refer to podiatry.  I also think she needs some help with her toenails. - Ambulatory referral to Podiatry  2. Hypertension, unspecified type Good control.  3. 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  4. Malignant neoplasm of lower-outer quadrant of left breast of female, estrogen receptor positive ([place]) Treated with Femara  5. Seizure ([place]) Followed by neurology and on Keppra  6. Intellectual disability    No follow-ups on file.      I, Helen Durie, MD, have reviewed all documentation for this  visit. The documentation on 06/08/20 for the exam, diagnosis, procedures, and orders are all accurate and complete.    Helen Patton Mon, MD  [facility] [phone] (phone) [phone] (fax)  [place]

## 2020-06-05 DIAGNOSIS — M79675 Pain in left toe(s): Secondary | ICD-10-CM | POA: Diagnosis not present

## 2020-06-05 DIAGNOSIS — M79674 Pain in right toe(s): Secondary | ICD-10-CM | POA: Diagnosis not present

## 2020-06-05 DIAGNOSIS — B351 Tinea unguium: Secondary | ICD-10-CM | POA: Diagnosis not present

## 2020-06-06 ENCOUNTER — Telehealth: Payer: Medicare Other | Admitting: Physician Assistant

## 2020-06-06 DIAGNOSIS — R112 Nausea with vomiting, unspecified: Secondary | ICD-10-CM | POA: Diagnosis not present

## 2020-06-06 MED ORDER — ONDANSETRON 4 MG PO TBDP: 4.0000 mg | ORAL_TABLET | Freq: Three times a day (TID) | ORAL | 0 refills | Status: AC | PRN

## 2020-06-06 NOTE — Progress Notes (Signed)
I have spent 5 minutes in review of e-visit questionnaire, review and updating patient chart, medical decision making and response to patient.   Areeba Sulser Cody Santez [place], PA-C    

## 2020-06-06 NOTE — Progress Notes (Signed)
We are sorry that you are not feeling well. Here is how we plan to help!  Based on what you have shared with me it looks like you have nausea and vomiting from your migraine headache. Vomiting is the forceful emptying of a portion of the stomach's content through the mouth.  Although nausea and vomiting can make you feel miserable, it's important to remember that these are not diseases, but rather symptoms of an underlying illness.  When we treat short term symptoms, we always caution that any symptoms that persist should be fully evaluated in a medical office. As such if you are not able to get your migraine under control with your typical medications, please reach out to your PCP or submit a video visit as we are not allowed to directly treat migraines themselves from evisits.  I have prescribed a medication that will help alleviate your symptoms and allow you to stay hydrated:  Zofran 4 mg 1 tablet every 8 hours as needed for nausea and vomiting  HOME CARE:  Drink clear liquids.  This is very important! Dehydration (the lack of fluid) can lead to a serious complication.  Start off with 1 tablespoon every 5 minutes for 8 hours.  You may begin eating bland foods after 8 hours without vomiting.  Start with saltine crackers, white bread, rice, mashed potatoes, applesauce.  After 48 hours on a bland diet, you may resume a normal diet.  Try to go to sleep.  Sleep often empties the stomach and relieves the need to vomit.  GET HELP RIGHT AWAY IF:   Your symptoms do not improve or worsen within 2 days after treatment.  You have a fever for over 3 days.  You cannot keep down fluids after trying the medication.  MAKE SURE YOU:   Understand these instructions.  Will watch your condition.  Will get help right away if you are not doing well or get worse.   Thank you for choosing an e-visit. Your e-visit answers were reviewed by a board certified advanced clinical practitioner to complete  your personal care plan. Depending upon the condition, your plan could have included both over the counter or prescription medications. Please review your pharmacy choice. Be sure that the pharmacy you have chosen is open so that you can pick up your prescription now.  If there is a problem you may message your provider in [place] to have the prescription routed to another pharmacy. Your safety is important to [place]. If you have drug allergies check your prescription carefully.  For the next 24 hours, you can use MyChart to ask questions about today's visit, request a non-urgent call back, or ask for a work or school excuse from your e-visit provider. You will get an e-mail in the next two days asking about your experience. I hope that your e-visit has been valuable and will speed your recovery.

## 2020-06-21 ENCOUNTER — Other Ambulatory Visit: Payer: Self-pay

## 2020-06-21 ENCOUNTER — Ambulatory Visit (INDEPENDENT_AMBULATORY_CARE_PROVIDER_SITE_OTHER): Payer: Medicare Other | Admitting: Family Medicine

## 2020-06-21 ENCOUNTER — Encounter: Payer: Self-pay | Admitting: Family Medicine

## 2020-06-21 VITALS — BP 122/70 | HR 85 | Temp 97.9°F | Resp 16 | Ht 65.0 in | Wt 116.0 lb

## 2020-06-21 DIAGNOSIS — C50512 Malignant neoplasm of lower-outer quadrant of left female breast: Secondary | ICD-10-CM | POA: Diagnosis not present

## 2020-06-21 DIAGNOSIS — G804 Ataxic cerebral palsy: Secondary | ICD-10-CM

## 2020-06-21 DIAGNOSIS — Z96649 Presence of unspecified artificial hip joint: Secondary | ICD-10-CM | POA: Diagnosis not present

## 2020-06-21 DIAGNOSIS — K029 Dental caries, unspecified: Secondary | ICD-10-CM | POA: Diagnosis not present

## 2020-06-21 DIAGNOSIS — Z170 Estrogen receptor positive status [ER+]: Secondary | ICD-10-CM

## 2020-06-21 DIAGNOSIS — F79 Unspecified intellectual disabilities: Secondary | ICD-10-CM | POA: Diagnosis not present

## 2020-06-21 DIAGNOSIS — Z1231 Encounter for screening mammogram for malignant neoplasm of breast: Secondary | ICD-10-CM | POA: Diagnosis not present

## 2020-06-21 DIAGNOSIS — L03211 Cellulitis of face: Secondary | ICD-10-CM | POA: Diagnosis not present

## 2020-06-21 DIAGNOSIS — Z0000 Encounter for general adult medical examination without abnormal findings: Secondary | ICD-10-CM

## 2020-06-21 DIAGNOSIS — R569 Unspecified convulsions: Secondary | ICD-10-CM

## 2020-06-21 MED ORDER — AZITHROMYCIN 250 MG PO TABS: ORAL_TABLET | ORAL | 0 refills | Status: AC

## 2020-06-21 NOTE — Progress Notes (Signed)
Complete physical exam   Patient: Helen Patton   DOB: 1960-07-09   60 y.o. Female  MRN: [id number] Visit Date: 06/21/2020  Today's healthcare provider: Wilhemena Durie, MD   Chief Complaint  Patient presents with  . AWV follow up    Subjective    Helen Patton is a 60 y.o. female who presents today for a routine exam.  She reports consuming a general diet. The patient does not participate in regular exercise at present. She generally feels well. She reports sleeping well. She does not have additional problems to discuss today.  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  Staff is concerned that she is seems to have a little swelling in her left cheek.  Staff seems to think it may be a dental issue.  She saw dentist just 2 weeks ago.  She has very poor dentition. Past Medical History:  Diagnosis Date  . Anxiety   . Arthritis    osteoarthritis  . Breast cancer ([place]) 2015   [name]  . Cancer ([place]) 11/2013   T1c, Nx; ER/ PR +; Her 2 neg invasive mammary carcinoma left breast. Wide excision only based on Tumor Board review.   . Cataract   . Depression   . Difficulty swallowing   . Falls   . GERD (gastroesophageal reflux disease)   . Hidradenitis 2012   MRSA, on chronic topical suppression  . History of hiatal hernia   . Hyperlipidemia   . Hypertension   . Mental retardation   . MRSA (methicillin resistant staph aureus) culture positive 2012  . Osteoporosis   . Pneumonia    aspiration pneumonia  . Seizures [facility])    last seizure February 2017   Past Surgical History:  Procedure Laterality Date  . BREAST LUMPECTOMY Left 12/07/2013   T1b, NX, ER/PR positive, HER-2/neu not overexpressed, 7 mm.  Not candidate for chemotherapy/sentinel node/radiation based on mental retardation.  Marland Kitchen BREAST SURGERY Left 12/07/2013   T1b, Nx; ER+, PR+, her 2 not amplified. (Not candidate for  chemotherapy/radiation based on mental retardation)  . broken leg    . CERVICAL ABLATION    . 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 dislocated hip    . HARDWARE REMOVAL  08/23/2015   Procedure: HARDWARE REMOVAL;  Surgeon: Dereck Leep, MD;  Location: [facility] ORS;  Service: Orthopedics;;  . TOTAL HIP REVISION Right 08/23/2015   Procedure: TOTAL HIP REVISION;  Surgeon: Dereck Leep, MD;  Location: [facility] ORS;  Service: Orthopedics;  Laterality: Right;   Social History   Socioeconomic History  . Marital status: Single    Spouse name: Not on file  . Number of children: Not on file  . Years of education: Not on file  . Highest education level: Not on file  Occupational History  . Not on file  Tobacco Use  . Smoking status: Never Smoker  . Smokeless tobacco: Never Used  Vaping Use  . Vaping Use: Never used  Substance and Sexual Activity  . Alcohol use: No  . Drug use: No  . Sexual activity: Never  Other Topics Concern  . Not on file  Social History Narrative  . Not on file   Social Determinants of Health   Financial Resource Strain: Not on file  Food Insecurity:  Not on file  Transportation Needs: Not on file  Physical Activity: Not on file  Stress: Not on file  Social Connections: Not on file  Intimate Partner Violence: Not on file   Family Status  Relation Name Status  . Father  Deceased  . Mother  Alive  . Brother  Deceased  . Mat Aunt  (Not Specified)  . MGM  (Not Specified)  . PGM  (Not Specified)   Family History  Problem Relation Age of Onset  . Diabetes Father   . Parkinson's disease Father   . Neuropathy Mother   . Arthritis Mother   . Diabetes Mother   . Autoimmune disease Brother        AIDS  . Cancer Maternal Aunt 63       breast  . Breast cancer Maternal Aunt   . Hypertension Maternal Grandmother   . Cancer Paternal Grandmother     Allergies  Allergen Reactions  . Sulfa Antibiotics Other (See Comments)    "Does not know"  . Keflex [Cephalexin] Other (See Comments)    "Does not know" unknown    Patient Care Team: Jerrol Banana., MD as PCP - General (Family Medicine) Jerrol Banana., MD (Family Medicine) Bary Castilla, Forest Gleason, MD (General Surgery)   Medications: Outpatient Medications Prior to Visit  Medication Sig  . acetaminophen (TYLENOL) 325 MG tablet Take 2 tablets (650 mg total) by mouth every 6 (six) hours as needed.  Marland Kitchen alendronate (FOSAMAX) 70 MG tablet TAKE 1 TABLET  BY MOUTH WEEKLY EARLY MORNING BEFORE FOOD/MEDS WITH WATER. DO NOT LIE DOWN FOR 30 MINUTES  . amLODipine (NORVASC) 10 MG tablet TAKE 1 TABLET BY MOUTH AT BEDTIME  . BD ECLIPSE SYRINGE 25G X 1" 3 ML MISC FOR USE WITH B12  . BENZOYL PEROXIDE 5 % external wash USE TOPICALLY ONCE DAILY  . calcium-vitamin D (OSCAL WITH D) 500-200 MG-UNIT TABS tablet TAKE 1 TABLET BY MOUTH EVERY DAY WITH BREAKFAST  . cetirizine (ZYRTEC) 10 MG tablet Take 1 tablet (10 mg total) by mouth daily.  . clotrimazole (LOTRIMIN) 1 % cream APPLY TOPICALLY TWICE A DAY AS NEEDED FOR RASH IN SKIN FOLDS  . coal tar (NEUTROGENA T-GEL) 0.5 % shampoo Use as directed  . cyanocobalamin (,VITAMIN B-12,) 1000 MCG/ML injection INJECT 1ML=1000MCG INTRAMUSCULARLY EVERY MONTH (B-12 SUPPLEMENT)  . diphenhydrAMINE (BENADRYL) 12.5 MG chewable tablet Chew 1 tablet (12.5 mg total) by mouth 3 (three) times daily.  Marland Kitchen docusate sodium (COLACE) 100 MG capsule TAKE 1 CAPSULE BY MOUTH 2 TIMES PER DAY  . DULoxetine (CYMBALTA) 30 MG capsule Take 30 mg by mouth daily.  . DULoxetine (CYMBALTA) 60 MG capsule Take 1 capsule (60 mg total) by mouth daily.  Marland Kitchen ipratropium (ATROVENT) 0.03 % nasal spray Place 2 sprays into both nostrils 3 (three) times daily.  Marland Kitchen lamoTRIgine (LAMICTAL) 150 MG tablet TAKE 2 TABLETS BY MOUTH 2 TIMES PER DAY  . letrozole (FEMARA) 2.5 MG tablet TAKE 1 TABLET BY MOUTH  ONCE DAILY *HAZARDOUS DRUG: WEAR GLOVES* (Patient not taking: No sig reported)  . levETIRAcetam (KEPPRA) 250 MG tablet Take 1 tablet (250 mg total) by mouth 2 (two) times daily for 2 days. Then stop. (Patient not taking: No sig reported)  . LORazepam (ATIVAN) 0.5 MG tablet Take 0.5 mg by mouth 2 (two) times daily. Tice a day at noon and at bedtime  . LORazepam (ATIVAN) 1 MG tablet Take 1 tablet by mouth every morning, and 1/2 tablet at noon  and bedtime. (Patient taking differently: 1 mg. Take 1 tablet by mouth every morning)  . losartan (COZAAR) 50 MG tablet TAKE 2 TABLETS (100 MG) BY MOUTH ONCE DAILY  . ondansetron (ZOFRAN ODT) 4 MG disintegrating tablet Take 1 tablet (4 mg total) by mouth every 8 (eight) hours as needed for nausea or vomiting.  . pantoprazole (PROTONIX) 40 MG tablet TAKE 1 TABLET BY MOUTH 2 TIMES PER DAY **DO NOT CRUSH**  . senna (SENOKOT) 8.6 MG TABS tablet Take 1 tablet (8.6 mg total) by mouth 2 (two) times daily.  Marland Kitchen terbinafine (LAMISIL) 1 % cream Use 2 times a week as needed  . triamcinolone (KENALOG) 0.025 % ointment Apply 1 application topically 2 (two) times daily as needed.   No facility-administered medications prior to visit.    Review of Systems  All other systems reviewed and are negative.      Objective    BP 122/70   Pulse 85   Temp 97.9 F (36.6 C)   Resp 16   Ht 5' 5" (1.651 m)   Wt 116 lb (52.6 kg)   BMI 19.30 kg/m  BP Readings from Last 3 Encounters:  06/21/20 122/70  06/01/20 113/68  04/11/20 (!) 117/56   Wt Readings from Last 3 Encounters:  06/21/20 116 lb (52.6 kg)  06/01/20 116 lb 3.2 oz (52.7 kg)  04/11/20 118 lb (53.5 kg)      Physical Exam Vitals reviewed.  Constitutional:      Appearance: She is well-developed.  HENT:     Head: Normocephalic and atraumatic.     Right Ear: External ear normal.     Left Ear: External ear normal.     Nose: Nose normal.     Mouth/Throat:     Mouth: Mucous membranes are moist.     Comments:  Very poor dentition. Eyes:     Conjunctiva/sclera: Conjunctivae normal.     Pupils: Pupils are equal, round, and reactive to light.  Cardiovascular:     Rate and Rhythm: Normal rate and regular rhythm.     Heart sounds: Normal heart sounds.  Pulmonary:     Effort: Pulmonary effort is normal.     Breath sounds: Normal breath sounds.  Abdominal:     General: Bowel sounds are normal.     Palpations: Abdomen is soft.  Musculoskeletal:     Cervical back: Normal range of motion and neck supple.  Skin:    General: Skin is warm and dry.  Neurological:     General: No focal deficit present.     Mental Status: She is alert.     Cranial Nerves: No cranial nerve deficit.     Comments: She is a pleasant stable.  She is unable to verbally communicate.  She usually seems to understand what we say.  Psychiatric:        Mood and Affect: Mood normal.        Behavior: Behavior normal.       Last depression screening scores PHQ 2/9 Scores 01/19/2020 05/13/2018 04/02/2018  PHQ - 2 Score 0 - -  PHQ- 9 Score 2 - -  Exception Documentation - Medical reason Other- indicate reason in comment box  Not completed - - caregiver not sure, pt not able to answer due to mental status   Last fall risk screening Fall Risk  04/02/2018  Falls in the past year? 1  Number falls in past yr: 0  Injury with Fall? 0  Follow up -  Last Audit-C alcohol use screening No flowsheet data found. A score of 3 or more in women, and 4 or more in men indicates increased risk for alcohol abuse, EXCEPT if all of the points are from question 1   No results found for any visits on 06/21/20.  Assessment & Plan    Routine Health Maintenance and Physical Exam  Exercise Activities and Dietary recommendations Goals   None     Immunization History  Administered Date(s) Administered  . Influenza Split 12/18/2004, 11/28/2011  . Influenza,inj,Quad PF,6+ Mos 12/30/2012, 12/18/2013, 10/29/2014, 12/09/2015, 11/19/2018, 01/19/2020   . Moderna Sars-Covid-2 Vaccination 03/17/2019, 04/15/2019, 11/09/2019  . Tdap 06/13/2017    Health Maintenance  Topic Date Due  . MAMMOGRAM  03/17/2020  . COVID-19 Vaccine (4 - Booster for Moderna series) 05/08/2020  . Fecal DNA (Cologuard)  07/10/2020  . INFLUENZA VACCINE  09/11/2020  . TETANUS/TDAP  06/14/2027  . Hepatitis C Screening  Completed  . HIV Screening  Completed  . HPV VACCINES  Aged Out  . PAP SMEAR-Modifier  Discontinued    Discussed health benefits of physical activity, and encouraged her to engage in regular exercise appropriate for her age and condition.  1. Cellulitis of face  - azithromycin (ZITHROMAX) 250 MG tablet; Take 2 tablets on day 1, then 1 tablet daily on days 2 through 5  Dispense: 6 tablet; Refill: 0  2. Encounter for screening mammogram for malignant neoplasm of breast   3. 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  4. Status post total replacement of hip, unspecified laterality   5. Seizure [facility]) Per neurology  6. 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  Dental caries  No follow-ups on file.     I, Wilhemena Durie, MD, have reviewed all documentation for this visit. The documentation on 06/24/20 for the exam, diagnosis, procedures, and orders are all accurate and complete.    Anthoni Geerts Cranford Mon, MD  [facility] [phone] (phone) [phone] (fax)  [place]

## 2020-07-04 ENCOUNTER — Telehealth: Payer: Self-pay | Admitting: Family Medicine

## 2020-07-04 NOTE — Telephone Encounter (Signed)
Called on behalf of patient to request a medication to calm patient who has a referral for a mammogram.  Stated that the last mammo that patient was supposed to get, the patient had to take a medication so that she could tolerate the test.  Please advise and call to confirm at [phone]

## 2020-07-14 NOTE — Telephone Encounter (Signed)
Called to check the status of request that was sent on 5/24 for medication to calm patient during mammogram.  Please update.

## 2020-07-14 NOTE — Telephone Encounter (Signed)
Alprzolam 0.5 mg 1 hour before--may repeat 30 minutes before.

## 2020-07-17 NOTE — Telephone Encounter (Signed)
Order written and faxed to Isaias Sakai at [facility]. [phone]. Please send into pharmacy as well. Thanks!

## 2020-07-19 ENCOUNTER — Other Ambulatory Visit: Payer: Self-pay

## 2020-07-19 ENCOUNTER — Ambulatory Visit (INDEPENDENT_AMBULATORY_CARE_PROVIDER_SITE_OTHER): Payer: Medicare Other | Admitting: Family Medicine

## 2020-07-19 ENCOUNTER — Encounter: Payer: Self-pay | Admitting: Family Medicine

## 2020-07-19 VITALS — BP 131/77 | HR 86 | Temp 97.9°F | Resp 16 | Wt 112.0 lb

## 2020-07-19 DIAGNOSIS — G808 Other cerebral palsy: Secondary | ICD-10-CM

## 2020-07-19 DIAGNOSIS — K219 Gastro-esophageal reflux disease without esophagitis: Secondary | ICD-10-CM | POA: Diagnosis not present

## 2020-07-19 DIAGNOSIS — I10 Essential (primary) hypertension: Secondary | ICD-10-CM | POA: Diagnosis not present

## 2020-07-19 DIAGNOSIS — R569 Unspecified convulsions: Secondary | ICD-10-CM

## 2020-07-19 DIAGNOSIS — F419 Anxiety disorder, unspecified: Secondary | ICD-10-CM

## 2020-07-19 DIAGNOSIS — Z170 Estrogen receptor positive status [ER+]: Secondary | ICD-10-CM

## 2020-07-19 DIAGNOSIS — F79 Unspecified intellectual disabilities: Secondary | ICD-10-CM | POA: Diagnosis not present

## 2020-07-19 DIAGNOSIS — C50512 Malignant neoplasm of lower-outer quadrant of left female breast: Secondary | ICD-10-CM

## 2020-07-19 NOTE — Telephone Encounter (Signed)
Shay with [facility] stated their office nor the pharmacy received order. Shay asked that the order be faxed to them and the pharmacy. Cb# [phone]

## 2020-07-19 NOTE — Progress Notes (Signed)
Established patient visit   Patient: Helen Patton   DOB: [birth year]   60 y.o. Female  MRN: [id number] Visit Date: 07/19/2020  Today's healthcare provider: Wilhemena Durie, MD   Chief Complaint  Patient presents with   Follow-up   Subjective    HPI  Patient presents today needing FL2 forms filled out.  Everything is stable.  Multiple change from patient medical patients are reviewed and stable and correct.  She has had no recent seizures or falls.  She has had no obvious complaints to the staff at her group home. She recently had a tooth pulled and ultimately did very well with the procedure and is feeling better.     Medications: Outpatient Medications Prior to Visit  Medication Sig   acetaminophen (TYLENOL) 325 MG tablet Take 2 tablets (650 mg total) by mouth every 6 (six) hours as needed.   alendronate (FOSAMAX) 70 MG tablet TAKE 1 TABLET  BY MOUTH WEEKLY EARLY MORNING BEFORE FOOD/MEDS WITH WATER. DO NOT LIE DOWN FOR 30 MINUTES   amLODipine (NORVASC) 10 MG tablet TAKE 1 TABLET BY MOUTH AT BEDTIME   BD ECLIPSE SYRINGE 25G X 1" 3 ML MISC FOR USE WITH B12   BENZOYL PEROXIDE 5 % external wash USE TOPICALLY ONCE DAILY   calcium-vitamin D (OSCAL WITH D) 500-200 MG-UNIT TABS tablet TAKE 1 TABLET BY MOUTH EVERY DAY WITH BREAKFAST   cetirizine (ZYRTEC) 10 MG tablet Take 1 tablet (10 mg total) by mouth daily.   clotrimazole (LOTRIMIN) 1 % cream APPLY TOPICALLY TWICE A DAY AS NEEDED FOR RASH IN SKIN FOLDS   coal tar (NEUTROGENA T-GEL) 0.5 % shampoo Use as directed   cyanocobalamin (,VITAMIN B-12,) 1000 MCG/ML injection INJECT 1ML=1000MCG INTRAMUSCULARLY EVERY MONTH (B-12 SUPPLEMENT)   diphenhydrAMINE (BENADRYL) 12.5 MG chewable tablet Chew 1 tablet (12.5 mg total) by mouth 3 (three) times daily.   docusate sodium (COLACE) 100 MG capsule TAKE 1 CAPSULE BY MOUTH 2 TIMES PER DAY   DULoxetine (CYMBALTA) 30 MG capsule Take 30 mg by mouth daily.   DULoxetine (CYMBALTA) 60 MG  capsule Take 1 capsule (60 mg total) by mouth daily.   ipratropium (ATROVENT) 0.03 % nasal spray Place 2 sprays into both nostrils 3 (three) times daily.   lamoTRIgine (LAMICTAL) 150 MG tablet TAKE 2 TABLETS BY MOUTH 2 TIMES PER DAY   letrozole (FEMARA) 2.5 MG tablet TAKE 1 TABLET BY MOUTH ONCE DAILY *HAZARDOUS DRUG: WEAR GLOVES* (Patient not taking: No sig reported)   levETIRAcetam (KEPPRA) 250 MG tablet Take 1 tablet (250 mg total) by mouth 2 (two) times daily for 2 days. Then stop. (Patient not taking: No sig reported)   LORazepam (ATIVAN) 0.5 MG tablet Take 0.5 mg by mouth 2 (two) times daily. Tice a day at noon and at bedtime   LORazepam (ATIVAN) 1 MG tablet Take 1 tablet by mouth every morning, and 1/2 tablet at noon and bedtime. (Patient taking differently: 1 mg. Take 1 tablet by mouth every morning)   losartan (COZAAR) 50 MG tablet TAKE 2 TABLETS (100 MG) BY MOUTH ONCE DAILY   ondansetron (ZOFRAN ODT) 4 MG disintegrating tablet Take 1 tablet (4 mg total) by mouth every 8 (eight) hours as needed for nausea or vomiting.   pantoprazole (PROTONIX) 40 MG tablet TAKE 1 TABLET BY MOUTH 2 TIMES PER DAY **DO NOT CRUSH**   senna (SENOKOT) 8.6 MG TABS tablet Take 1 tablet (8.6 mg total) by mouth 2 (two) times daily.  terbinafine (LAMISIL) 1 % cream Use 2 times a week as needed   triamcinolone (KENALOG) 0.025 % ointment Apply 1 application topically 2 (two) times daily as needed.   No facility-administered medications prior to visit.    Review of Systems  All other systems reviewed and are negative.       Objective    BP 131/77   Pulse 86   Temp 97.9 F (36.6 C)   Resp 16   Wt 112 lb (50.8 kg)   BMI 18.64 kg/m  BP Readings from Last 3 Encounters:  07/19/20 131/77  06/21/20 122/70  06/01/20 113/68   Wt Readings from Last 3 Encounters:  07/19/20 112 lb (50.8 kg)  06/21/20 116 lb (52.6 kg)  06/01/20 116 lb 3.2 oz (52.7 kg)       Physical Exam Vitals reviewed.  Constitutional:       Appearance: Normal appearance.  HENT:     Head:     Comments: Patient with congenital microcephaly    Right Ear: External ear normal.     Left Ear: External ear normal.  Cardiovascular:     Rate and Rhythm: Normal rate and regular rhythm.     Heart sounds: Murmur heard.     Comments: 2/6 early systolic murmur heard best at the right upper sternal border Pulmonary:     Breath sounds: Normal breath sounds.  Skin:    General: Skin is warm and dry.  Neurological:     Mental Status: Mental status is at baseline.  Psychiatric:        Mood and Affect: Mood normal.        Behavior: Behavior normal.      No results found for any visits on 07/19/20.  Assessment & Plan      1. Hypertension, unspecified type Controlled on amlodipine and 10 and losartan 50 mg  2. Gastroesophageal reflux disease, unspecified whether esophagitis present On pantoprazole 40 mg twice a day, consider cutting back to once a day in the future  3. Other cerebral palsy ([place]) Patient lives in group home  4. Seizure ([place]) On Lamictal and followed by neurology  5. Intellectual disability Lives in group home and works in a supervised environment.  She actually enjoys her work  6. Malignant neoplasm of lower-outer quadrant of left breast of female, estrogen receptor positive ([place]) On Femara  7. Anxiety On as needed lorazepam. FL 2 form is filled out 3-year.More than 50% 25 minute visit spent in counseling or coordination of care   No follow-ups on file.      I, Wilhemena Durie, MD, have reviewed all documentation for this visit. The documentation on 07/23/20 for the exam, diagnosis, procedures, and orders are all accurate and complete.    Dominick Zertuche Cranford Mon, MD  [facility] [phone] (phone) [phone] (fax)  [place]

## 2020-07-19 NOTE — Telephone Encounter (Signed)
Please sent in alprazolam?

## 2020-07-26 DIAGNOSIS — Z23 Encounter for immunization: Secondary | ICD-10-CM | POA: Diagnosis not present

## 2020-07-27 ENCOUNTER — Other Ambulatory Visit: Payer: Self-pay

## 2020-07-27 ENCOUNTER — Ambulatory Visit
Admission: RE | Admit: 2020-07-27 | Discharge: 2020-07-27 | Disposition: A | Discharge status: Home / Self Care | Payer: Medicare Other | Source: Ambulatory Visit | Attending: Family Medicine | Admitting: Family Medicine

## 2020-07-27 DIAGNOSIS — Z1231 Encounter for screening mammogram for malignant neoplasm of breast: Secondary | ICD-10-CM | POA: Diagnosis not present

## 2020-08-17 ENCOUNTER — Telehealth: Payer: Self-pay

## 2020-08-17 DIAGNOSIS — Z1211 Encounter for screening for malignant neoplasm of colon: Secondary | ICD-10-CM

## 2020-08-17 NOTE — Telephone Encounter (Signed)
Is it okay to order a cologuard?  Thanks,   -Mickel Baas

## 2020-08-17 NOTE — Telephone Encounter (Signed)
Copied from [place] [phone]. Topic: General - Other >> Aug 17, 2020 11:30 AM Jodie Echevaria wrote: Reason for CRM: [place] caretaker at the group home where patient resides called in to inform Dr Rosanna Randy that they received a letter from [facility] stating that patient will be due for testing now since last one was done 3 years earlier. Please call [place] at Ph#  [phone]

## 2020-09-01 NOTE — Telephone Encounter (Signed)
Ordered

## 2020-09-04 DIAGNOSIS — B351 Tinea unguium: Secondary | ICD-10-CM | POA: Diagnosis not present

## 2020-09-04 DIAGNOSIS — M79675 Pain in left toe(s): Secondary | ICD-10-CM | POA: Diagnosis not present

## 2020-09-04 DIAGNOSIS — M79674 Pain in right toe(s): Secondary | ICD-10-CM | POA: Diagnosis not present

## 2020-09-29 ENCOUNTER — Other Ambulatory Visit: Payer: Self-pay | Admitting: Family Medicine

## 2020-09-29 DIAGNOSIS — N390 Urinary tract infection, site not specified: Secondary | ICD-10-CM | POA: Diagnosis not present

## 2020-09-29 DIAGNOSIS — R350 Frequency of micturition: Secondary | ICD-10-CM | POA: Diagnosis not present

## 2020-09-29 NOTE — Telephone Encounter (Signed)
Requested medication (s) are due for refill today:yes   Requested medication (s) are on the active medication list: yes   Last refill:  08/23/2020  Future visit scheduled: yes   Notes to clinic:  Medication not assigned to a protocol, review manually   Requested Prescriptions  Pending Prescriptions Disp Refills   cyanocobalamin (,VITAMIN B-12,) 1000 MCG/ML injection [Pharmacy Med Name: Cyanocobalamin 1000 MCG/ML Solution] 1 mL 11    Sig: INJECT 1ML=1000MCG INTRAMUSCULARLY EVERY MONTH (B-12 SUPPLEMENT)     Off-Protocol Failed - 09/29/2020  3:15 PM      Failed - Medication not assigned to a protocol, review manually.      Passed - Valid encounter within last 12 months    Recent Outpatient Visits           2 months ago Hypertension, unspecified type   [facility] Jerrol Banana., MD   3 months ago Cellulitis of face   [facility] Jerrol Banana., MD   4 months ago Pain and swelling of toe of left foot   [facility] Jerrol Banana., MD   5 months ago Hypertension, unspecified type   [facility] Jerrol Banana., MD   8 months ago Hypertension, unspecified type   [facility] Jerrol Banana., MD       Future Appointments             In 2 weeks Jerrol Banana., MD [facility]   In 2 months Jerrol Banana., MD [facility] [place]   In 3 months Jerrol Banana., MD [facility]           Off-Protocol Failed - 09/29/2020  3:15 PM      Failed - Medication not assigned to a protocol, review manually.      Passed - Valid encounter within last 12 months    Recent Outpatient Visits           2 months ago Hypertension, unspecified type   [facility] Jerrol Banana., MD   3 months ago Cellulitis of face   [facility] Jerrol Banana., MD   4 months ago Pain  and swelling of toe of left foot   [facility] Jerrol Banana., MD   5 months ago Hypertension, unspecified type   [facility] Jerrol Banana., MD   8 months ago Hypertension, unspecified type   [facility] Jerrol Banana., MD       Future Appointments             In 2 weeks Jerrol Banana., MD [facility], [facility]   In 2 months Jerrol Banana., MD [facility], [place]   In 3 months Jerrol Banana., MD [facility]             BD ECLIPSE SYRINGE 25G X 1" 3 ML MISC [Pharmacy Med Name: BD Eclipse Syringe 25G X 1" 3 ML Miscellaneous] 1 each 10    Sig: FOR USE WITH B12     There is no refill protocol information for this order

## 2020-10-17 ENCOUNTER — Ambulatory Visit (INDEPENDENT_AMBULATORY_CARE_PROVIDER_SITE_OTHER): Payer: Medicare Other | Admitting: Family Medicine

## 2020-10-17 ENCOUNTER — Encounter: Payer: Self-pay | Admitting: Family Medicine

## 2020-10-17 ENCOUNTER — Other Ambulatory Visit: Payer: Self-pay

## 2020-10-17 VITALS — BP 112/58 | HR 77 | Temp 98.1°F | Resp 16 | Ht <= 58 in | Wt 108.0 lb

## 2020-10-17 DIAGNOSIS — Q02 Microcephaly: Secondary | ICD-10-CM | POA: Diagnosis not present

## 2020-10-17 DIAGNOSIS — C50512 Malignant neoplasm of lower-outer quadrant of left female breast: Secondary | ICD-10-CM | POA: Diagnosis not present

## 2020-10-17 DIAGNOSIS — Z23 Encounter for immunization: Secondary | ICD-10-CM

## 2020-10-17 DIAGNOSIS — E441 Mild protein-calorie malnutrition: Secondary | ICD-10-CM | POA: Diagnosis not present

## 2020-10-17 DIAGNOSIS — E279 Disorder of adrenal gland, unspecified: Secondary | ICD-10-CM

## 2020-10-17 DIAGNOSIS — K219 Gastro-esophageal reflux disease without esophagitis: Secondary | ICD-10-CM | POA: Diagnosis not present

## 2020-10-17 DIAGNOSIS — R634 Abnormal weight loss: Secondary | ICD-10-CM

## 2020-10-17 DIAGNOSIS — M87059 Idiopathic aseptic necrosis of unspecified femur: Secondary | ICD-10-CM | POA: Diagnosis not present

## 2020-10-17 DIAGNOSIS — E278 Other specified disorders of adrenal gland: Secondary | ICD-10-CM

## 2020-10-17 DIAGNOSIS — G808 Other cerebral palsy: Secondary | ICD-10-CM

## 2020-10-17 DIAGNOSIS — Z170 Estrogen receptor positive status [ER+]: Secondary | ICD-10-CM

## 2020-10-17 NOTE — Progress Notes (Signed)
Established patient visit   Patient: Helen Patton   DOB: May 29, 1960   60 y.o. Female  MRN: [id number] Visit Date: 10/17/2020  Today's healthcare provider: Wilhemena Durie, MD   Chief Complaint  Patient presents with   Hypertension   Gastroesophageal Reflux   Subjective  -------------------------------------------------------------------------------------------------------------------- HPI  Patient comes in today for follow-up.  Her Caregiver from her group home states she has no issues or no concerns or complaints.  She evidently feels well and acts well. Hypertension, follow-up  BP Readings from Last 3 Encounters:  10/17/20 (!) 112/58  07/19/20 131/77  06/21/20 122/70   Wt Readings from Last 3 Encounters:  10/17/20 108 lb (49 kg)  07/19/20 112 lb (50.8 kg)  06/21/20 116 lb (52.6 kg)     She was last seen for hypertension 3 months ago.  BP at that visit was 131/77. Management since that visit includes no medication changes.  She reports good compliance with treatment. She is not having side effects.  She is following a Regular diet. She is not exercising. She does not smoke.  Use of agents associated with hypertension: none.   Outside blood pressures are checked daily. Symptoms: No chest pain No chest pressure  No palpitations No syncope  No dyspnea No orthopnea  No paroxysmal nocturnal dyspnea No lower extremity edema   Pertinent labs: Lab Results  Component Value Date   CHOL 250 (H) 01/01/2018   HDL 68 01/01/2018   LDLCALC 141 (H) 01/01/2018   TRIG 207 (H) 01/01/2018   CHOLHDL 3.7 01/01/2018   Lab Results  Component Value Date   NA 141 04/11/2020   K 4.1 04/11/2020   CREATININE 0.83 04/11/2020   GFRNONAA >60 03/01/2019   GFRAA >60 03/01/2019   GLUCOSE 89 04/11/2020     The 10-year ASCVD risk score Mikey Bussing DC Jr., et al., 2013) is: 4.3%    --------------------------------------------------------------------------------------------------- GERD, Follow up:  The patient was last seen for GERD 3 months ago. Changes made since that visit include no medication changes.  She reports good compliance with treatment. She is not having side effects. .     Medications: Outpatient Medications Prior to Visit  Medication Sig   acetaminophen (TYLENOL) 325 MG tablet Take 2 tablets (650 mg total) by mouth every 6 (six) hours as needed.   alendronate (FOSAMAX) 70 MG tablet TAKE 1 TABLET  BY MOUTH WEEKLY EARLY MORNING BEFORE FOOD/MEDS WITH WATER. DO NOT LIE DOWN FOR 30 MINUTES   amLODipine (NORVASC) 10 MG tablet TAKE 1 TABLET BY MOUTH AT BEDTIME   BD ECLIPSE SYRINGE 25G X 1" 3 ML MISC FOR USE WITH B12   BENZOYL PEROXIDE 5 % external wash USE TOPICALLY ONCE DAILY   calcium-vitamin D (OSCAL WITH D) 500-200 MG-UNIT TABS tablet TAKE 1 TABLET BY MOUTH EVERY DAY WITH BREAKFAST   cetirizine (ZYRTEC) 10 MG tablet Take 1 tablet (10 mg total) by mouth daily.   clotrimazole (LOTRIMIN) 1 % cream APPLY TOPICALLY TWICE A DAY AS NEEDED FOR RASH IN SKIN FOLDS   coal tar (NEUTROGENA T-GEL) 0.5 % shampoo Use as directed   cyanocobalamin (,VITAMIN B-12,) 1000 MCG/ML injection INJECT 1ML=1000MCG INTRAMUSCULARLY EVERY MONTH (B-12 SUPPLEMENT)   diphenhydrAMINE (BENADRYL) 12.5 MG chewable tablet Chew 1 tablet (12.5 mg total) by mouth 3 (three) times daily.   docusate sodium (COLACE) 100 MG capsule TAKE 1 CAPSULE BY MOUTH 2 TIMES PER DAY   DULoxetine (CYMBALTA) 30 MG capsule Take 30 mg by mouth daily.  DULoxetine (CYMBALTA) 60 MG capsule Take 1 capsule (60 mg total) by mouth daily.   ipratropium (ATROVENT) 0.03 % nasal spray Place 2 sprays into both nostrils 3 (three) times daily.   lamoTRIgine (LAMICTAL) 150 MG tablet TAKE 2 TABLETS BY MOUTH 2 TIMES PER DAY   LORazepam (ATIVAN) 0.5 MG tablet Take 0.5 mg by mouth 2 (two) times daily. Tice a day at noon and at  bedtime   LORazepam (ATIVAN) 1 MG tablet Take 1 tablet by mouth every morning, and 1/2 tablet at noon and bedtime. (Patient taking differently: 1 mg. Take 1 tablet by mouth every morning)   losartan (COZAAR) 50 MG tablet TAKE 2 TABLETS (100 MG) BY MOUTH ONCE DAILY   ondansetron (ZOFRAN ODT) 4 MG disintegrating tablet Take 1 tablet (4 mg total) by mouth every 8 (eight) hours as needed for nausea or vomiting.   pantoprazole (PROTONIX) 40 MG tablet TAKE 1 TABLET BY MOUTH 2 TIMES PER DAY **DO NOT CRUSH**   senna (SENOKOT) 8.6 MG TABS tablet Take 1 tablet (8.6 mg total) by mouth 2 (two) times daily.   terbinafine (LAMISIL) 1 % cream Use 2 times a week as needed   triamcinolone (KENALOG) 0.025 % ointment Apply 1 application topically 2 (two) times daily as needed.   letrozole (FEMARA) 2.5 MG tablet TAKE 1 TABLET BY MOUTH ONCE DAILY *HAZARDOUS DRUG: WEAR GLOVES* (Patient not taking: Reported on 10/17/2020)   levETIRAcetam (KEPPRA) 250 MG tablet Take 1 tablet (250 mg total) by mouth 2 (two) times daily for 2 days. Then stop. (Patient not taking: No sig reported)   No facility-administered medications prior to visit.    Review of Systems  Constitutional:  Negative for activity change and fatigue.  Respiratory:  Negative for cough and shortness of breath.   Cardiovascular:  Negative for chest pain, palpitations and leg swelling.  Gastrointestinal:  Negative for constipation, diarrhea, nausea and vomiting.  Neurological:  Negative for dizziness, light-headedness and headaches.       Objective  -------------------------------------------------------------------------------------------------------------------- BP (!) 112/58   Pulse 77   Temp 98.1 F (36.7 C)   Resp 16   Ht '4\' 10"'$  (1.473 m)   Wt 108 lb (49 kg)   BMI 22.57 kg/m  BP Readings from Last 3 Encounters:  10/17/20 (!) 112/58  07/19/20 131/77  06/21/20 122/70   Wt Readings from Last 3 Encounters:  10/17/20 108 lb (49 kg)  07/19/20 112  lb (50.8 kg)  06/21/20 116 lb (52.6 kg)      Physical Exam Vitals reviewed.  Constitutional:      Appearance: Normal appearance.  HENT:     Head:     Comments: Patient with congenital microcephaly    Right Ear: External ear normal.     Left Ear: External ear normal.  Cardiovascular:     Rate and Rhythm: Normal rate and regular rhythm.     Heart sounds: Murmur heard.     Comments: 2/6 early systolic murmur heard best at the right upper sternal border Pulmonary:     Breath sounds: Normal breath sounds.  Skin:    General: Skin is warm and dry.  Neurological:     Mental Status: Mental status is at baseline.  Psychiatric:        Mood and Affect: Mood normal.        Behavior: Behavior normal.      No results found for any visits on 10/17/20.  Assessment & Plan  ---------------------------------------------------------------------------------------------------------------------- 1. Mild protein-calorie malnutrition ([place])  Will watch her weight and food intake over time  2. Weight loss She evidently has had an unintended weight loss down to 108 pounds, she was 112 on the last visit and 116 on her visit in the spring.  Patient is asymptomatic for any illness.  Discussed work-up with caregiver but will hold off for now.  Labs have been normal  3. Flu vaccine need  - Flu Vaccine QUAD 6+ mos PF IM (Fluarix Quad PF)  4. Gastroesophageal reflux disease, unspecified whether esophagitis present Could be contributing to weight loss.  Continue pantoprazole twice a day for now  5. Other cerebral palsy ([place])   6. Avascular necrosis of femoral head, unspecified laterality ([place]) History of hip replacement  7. Malignant neoplasm of lower-outer quadrant of left breast of female, estrogen receptor positive ([place]) On Femara  8. Microcephaly ([place])   9. Adrenal nodule ([place]) Will review and determine if she needs any further work-up Will get dedicated adrenal CT.  No follow-ups on  file.      I, Wilhemena Durie, MD, have reviewed all documentation for this visit. The documentation on 10/19/20 for the exam, diagnosis, procedures, and orders are all accurate and complete.    Cailee Blanke Cranford Mon, MD  [facility] [phone] (phone) [phone] (fax)  [place]

## 2020-10-19 DIAGNOSIS — Z853 Personal history of malignant neoplasm of breast: Secondary | ICD-10-CM | POA: Diagnosis not present

## 2020-10-20 ENCOUNTER — Telehealth: Payer: Self-pay

## 2020-10-20 ENCOUNTER — Other Ambulatory Visit: Payer: Self-pay

## 2020-10-20 DIAGNOSIS — E278 Other specified disorders of adrenal gland: Secondary | ICD-10-CM

## 2020-10-20 NOTE — Telephone Encounter (Signed)
Dr. Rosanna Randy, you requested to have a dedicated Adrenal CT for this patient and it doesn't look like her insurance will pay for it. Even using adrenal nodule dx is not getting covered.   Any suggestions? Thanks!

## 2020-11-09 DIAGNOSIS — G804 Ataxic cerebral palsy: Secondary | ICD-10-CM | POA: Diagnosis not present

## 2020-11-09 DIAGNOSIS — R569 Unspecified convulsions: Secondary | ICD-10-CM | POA: Diagnosis not present

## 2020-11-14 ENCOUNTER — Other Ambulatory Visit: Payer: Self-pay | Admitting: Family Medicine

## 2020-11-14 DIAGNOSIS — L309 Dermatitis, unspecified: Secondary | ICD-10-CM

## 2020-11-15 NOTE — Telephone Encounter (Signed)
Requested medication (s) are due for refill today:   Unsure provider to review  Requested medication (s) are on the active medication list:   No  Future visit scheduled:   Yes   Last ordered: 01/30/1018  30 ml, 11 refills  Returned because it's a 60 yr old Rx.   Provider to review.   Requested Prescriptions  Pending Prescriptions Disp Refills   THERAPEUTIC SHAMPOO 0.5 % shampoo [Pharmacy Med Name: Therapeutic 0.5 % Shampoo] 251 mL 11    Sig: APPLY TOPICALLY AT BEDTIME AS NEEDED     Over the Counter:  OTC Passed - 11/14/2020  3:38 PM      Passed - Valid encounter within last 12 months    Recent Outpatient Visits           4 weeks ago Mild protein-calorie malnutrition [facility])   [facility] Jerrol Banana., MD   3 months ago Hypertension, unspecified type   [facility] Jerrol Banana., MD   4 months ago Cellulitis of face   [facility] Jerrol Banana., MD   5 months ago Pain and swelling of toe of left foot   [facility] Jerrol Banana., MD   7 months ago Hypertension, unspecified type   [facility] Jerrol Banana., MD       Future Appointments             In 3 months Jerrol Banana., MD [facility], [place]

## 2020-11-16 ENCOUNTER — Telehealth: Payer: Self-pay | Admitting: Family Medicine

## 2020-11-16 NOTE — Telephone Encounter (Signed)
Donnella Sham called requesting an order for "inserts" no further details included Best contact: [phone]

## 2020-11-21 NOTE — Telephone Encounter (Signed)
Order written, waiting for provider to sign.

## 2020-11-22 NOTE — Telephone Encounter (Signed)
Order has been written and signed. Tried to contact Donnella Sham to advised order is ready. No answer and no vm.

## 2020-11-24 NOTE — Telephone Encounter (Signed)
Helen Patton calling stating that she is not sure what next steps are. She states that she would like to have order faxed to them. Please advise.      Fax# [phone]

## 2020-11-27 NOTE — Telephone Encounter (Signed)
Sent to MR for faxing

## 2020-12-18 ENCOUNTER — Ambulatory Visit: Payer: Self-pay | Admitting: Family Medicine

## 2020-12-19 DIAGNOSIS — F339 Major depressive disorder, recurrent, unspecified: Secondary | ICD-10-CM | POA: Diagnosis not present

## 2020-12-19 DIAGNOSIS — Z23 Encounter for immunization: Secondary | ICD-10-CM | POA: Diagnosis not present

## 2021-01-16 ENCOUNTER — Ambulatory Visit: Payer: Self-pay | Admitting: Family Medicine

## 2021-01-22 ENCOUNTER — Other Ambulatory Visit: Payer: Self-pay | Admitting: Family Medicine

## 2021-02-01 ENCOUNTER — Ambulatory Visit: Payer: Self-pay | Admitting: *Deleted

## 2021-02-01 NOTE — Telephone Encounter (Signed)
Reason for Disposition  Common cold with no complications  Answer Assessment - Initial Assessment Questions 1. ONSET: "When did the nasal discharge start?"      Runny nose.  Coughing is dry cough.   Yesterday started with a runny nose.  No sore throat or ear ache.  No fever.  No diarrhea or vomiting. 2. AMOUNT: "How much discharge is there?"      *No Answer* 3. COUGH: "Do you have a cough?" If yes, ask: "Describe the color of your sputum" (clear, white, yellow, green)     Yes dry cough 4. RESPIRATORY DISTRESS: "Describe your breathing."      No shortness of breath or wheezing 5. FEVER: "Do you have a fever?" If Yes, ask: "What is your temperature, how was it measured, and when did it start?"     No 6. SEVERITY: "Overall, how bad are you feeling right now?" (e.g., doesn't interfere with normal activities, staying home from school/work, staying in bed)      *No Answer* 7. OTHER SYMPTOMS: "Do you have any other symptoms?" (e.g., sore throat, earache, wheezing, vomiting)     No other symptoms. 8. PREGNANCY: "Is there any chance you are pregnant?" "When was your last menstrual period?"     N/A  Protocols used: Common Cold-A-AH  Chief Complaint: runny nose and a dry cough that started yesterday.   Pt lives in a facility "[name]" and they need something called in to [place] for coughing and congestion. Symptoms: runny nose and dry cough Frequency: constantly since yesterday Pertinent Negatives: Patient denies shortness of breath, wheezing, sore throat, diarrhea, vomiting.   Only sx are runny nose and dry cough per Ms. Ronnald Ramp from the facility. Disposition: [] ED /[] Urgent Care (no appt availability in office) / [] Appointment(In office/virtual)/ []  [facility]/ [x] Home Care/ [] Refused Recommended Disposition  Additional Notes: I returned the call to Ms. Ronnald Ramp.   She is requesting something be called into the pharmacy for runny nose and coughing for this pt.    They need something on record in order to give her anything.   I'm sending a message to [facility] with her request.   Ms. Ronnald Ramp can be reached at [phone].

## 2021-02-02 NOTE — Telephone Encounter (Signed)
See notes below. KW

## 2021-02-07 ENCOUNTER — Encounter: Payer: Self-pay | Admitting: Family Medicine

## 2021-02-07 ENCOUNTER — Ambulatory Visit (INDEPENDENT_AMBULATORY_CARE_PROVIDER_SITE_OTHER): Payer: Medicare Other | Admitting: Family Medicine

## 2021-02-07 ENCOUNTER — Other Ambulatory Visit: Payer: Self-pay

## 2021-02-07 VITALS — BP 118/53 | HR 83 | Temp 98.2°F | Resp 16 | Wt 110.0 lb

## 2021-02-07 DIAGNOSIS — J3489 Other specified disorders of nose and nasal sinuses: Secondary | ICD-10-CM

## 2021-02-07 DIAGNOSIS — F79 Unspecified intellectual disabilities: Secondary | ICD-10-CM | POA: Diagnosis not present

## 2021-02-07 DIAGNOSIS — R051 Acute cough: Secondary | ICD-10-CM | POA: Insufficient documentation

## 2021-02-07 DIAGNOSIS — R8290 Unspecified abnormal findings in urine: Secondary | ICD-10-CM

## 2021-02-07 LAB — POCT URINALYSIS DIPSTICK
Bilirubin, UA: NEGATIVE
Glucose, UA: NEGATIVE
Ketones, UA: NEGATIVE
Leukocytes, UA: NEGATIVE
Nitrite, UA: NEGATIVE
Protein, UA: NEGATIVE
Spec Grav, UA: 1.01 (ref 1.010–1.025)
Urobilinogen, UA: 0.2 E.U./dL
pH, UA: 6.5 (ref 5.0–8.0)

## 2021-02-07 MED ORDER — SALINE SPRAY 0.65 % NA SOLN: 1.0000 | NASAL | 0 refills | Status: AC | PRN

## 2021-02-07 MED ORDER — GUAIFENESIN-DM 100-10 MG/5ML PO SYRP: 5.0000 mL | ORAL_SOLUTION | ORAL | 0 refills | Status: AC | PRN

## 2021-02-07 NOTE — Assessment & Plan Note (Signed)
Cough syrup provided to assist; LCTAB No sick contacts

## 2021-02-07 NOTE — Addendum Note (Signed)
Addended by: Shawna Orleans on: 02/07/2021 02:42 PM   Modules accepted: Orders

## 2021-02-07 NOTE — Assessment & Plan Note (Signed)
With chaperone Pt answers in yes/no  Unable to differentiate response to CVA tenderness on exam

## 2021-02-07 NOTE — Assessment & Plan Note (Signed)
UA negative Will send for culture D/t cognitive deficit- unable to illicit response from CVA exam

## 2021-02-07 NOTE — Assessment & Plan Note (Signed)
Crusted, mucous on nose Encouraged use of moisture with nasal spray to assist with congestion and dried mucous

## 2021-02-07 NOTE — Progress Notes (Signed)
Established patient visit   Patient: Helen Patton   DOB: 09-19-1960   60 y.o. Female  MRN: [id number] Visit Date: 02/07/2021  Today's healthcare provider: Gwyneth Sprout, FNP   Chief Complaint  Patient presents with   Urinary Tract Infection   Cough   I,Sulibeya S Dimas,acting as a scribe for Gwyneth Sprout, FNP.,have documented all relevant documentation on the behalf of Gwyneth Sprout, FNP,as directed by  Gwyneth Sprout, FNP while in the presence of Gwyneth Sprout, FNP.  Subjective    Urinary Tract Infection  This is a new problem. The current episode started in the past 7 days. The problem occurs every urination. The pain is mild. There has been no fever. Associated symptoms include chills, flank pain and frequency. Pertinent negatives include no discharge, hematuria, nausea or vomiting. Associated symptoms comments: Foul smell to urine. She has tried nothing for the symptoms.  Cough This is a new problem. The current episode started in the past 7 days. The problem has been gradually improving. Associated symptoms include chills, eye redness and rhinorrhea. Pertinent negatives include no chest pain, fever, nasal congestion, sore throat, shortness of breath or wheezing.     Medications: Outpatient Medications Prior to Visit  Medication Sig   acetaminophen (TYLENOL) 325 MG tablet Take 2 tablets (650 mg total) by mouth every 6 (six) hours as needed.   alendronate (FOSAMAX) 70 MG tablet TAKE 1 TABLET  BY MOUTH WEEKLY EARLY MORNING BEFORE FOOD/MEDS WITH WATER. DO NOT LIE DOWN FOR 30 MINUTES   amLODipine (NORVASC) 10 MG tablet TAKE 1 TABLET BY MOUTH AT BEDTIME   BD ECLIPSE SYRINGE 25G X 1" 3 ML MISC FOR USE WITH B12   BENZOYL PEROXIDE 5 % external wash USE TOPICALLY ONCE DAILY   calcium-vitamin D (OSCAL WITH D) 500-200 MG-UNIT TABS tablet TAKE 1 TABLET BY MOUTH EVERY DAY WITH BREAKFAST   cetirizine (ZYRTEC) 10 MG tablet Take 1 tablet (10 mg total) by mouth daily.   clotrimazole  (LOTRIMIN) 1 % cream APPLY TOPICALLY TWICE A DAY AS NEEDED FOR RASH IN SKIN FOLDS   cyanocobalamin (,VITAMIN B-12,) 1000 MCG/ML injection INJECT 1ML=1000MCG INTRAMUSCULARLY EVERY MONTH (B-12 SUPPLEMENT)   diphenhydrAMINE (BENADRYL) 12.5 MG chewable tablet Chew 1 tablet (12.5 mg total) by mouth 3 (three) times daily.   docusate sodium (COLACE) 100 MG capsule TAKE 1 CAPSULE BY MOUTH 2 TIMES PER DAY   DULoxetine (CYMBALTA) 30 MG capsule Take 30 mg by mouth daily.   DULoxetine (CYMBALTA) 60 MG capsule Take 1 capsule (60 mg total) by mouth daily.   ipratropium (ATROVENT) 0.03 % nasal spray Place 2 sprays into both nostrils 3 (three) times daily.   lamoTRIgine (LAMICTAL) 150 MG tablet TAKE 2 TABLETS BY MOUTH 2 TIMES PER DAY   LORazepam (ATIVAN) 0.5 MG tablet Take 0.5 mg by mouth 2 (two) times daily. Tice a day at noon and at bedtime   LORazepam (ATIVAN) 1 MG tablet Take 1 tablet by mouth every morning, and 1/2 tablet at noon and bedtime. (Patient taking differently: 1 mg. Take 1 tablet by mouth every morning)   losartan (COZAAR) 50 MG tablet TAKE 2 TABLETS (100 MG) BY MOUTH ONCE DAILY   ondansetron (ZOFRAN ODT) 4 MG disintegrating tablet Take 1 tablet (4 mg total) by mouth every 8 (eight) hours as needed for nausea or vomiting.   pantoprazole (PROTONIX) 40 MG tablet TAKE 1 TABLET BY MOUTH 2 TIMES PER DAY **DO NOT CRUSH**  senna (SENOKOT) 8.6 MG TABS tablet Take 1 tablet (8.6 mg total) by mouth 2 (two) times daily.   terbinafine (LAMISIL) 1 % cream USE 2 TIMES A WEEK AS NEEDED   THERAPEUTIC SHAMPOO 0.5 % shampoo APPLY TOPICALLY AT BEDTIME AS NEEDED   triamcinolone (KENALOG) 0.025 % ointment APPLY 1 APPLICATION TOPICALLY TWICE A DAY AS NEEDED   letrozole (FEMARA) 2.5 MG tablet TAKE 1 TABLET BY MOUTH ONCE DAILY *HAZARDOUS DRUG: WEAR GLOVES* (Patient not taking: Reported on 10/17/2020)   levETIRAcetam (KEPPRA) 250 MG tablet Take 1 tablet (250 mg total) by mouth 2 (two) times daily for 2 days. Then stop.  (Patient not taking: No sig reported)   No facility-administered medications prior to visit.    Review of Systems  Constitutional:  Positive for chills. Negative for fever.  HENT:  Positive for rhinorrhea. Negative for sore throat.   Eyes:  Positive for redness. Negative for pain.  Respiratory:  Positive for cough. Negative for shortness of breath and wheezing.   Cardiovascular:  Negative for chest pain and palpitations.  Gastrointestinal:  Negative for nausea and vomiting.  Genitourinary:  Positive for flank pain and frequency. Negative for hematuria.      Objective    BP (!) 118/53 (BP Location: Right Arm, Patient Position: Sitting, Cuff Size: Normal)    Pulse 83    Temp 98.2 F (36.8 C) (Oral)    Resp 16    Wt 110 lb (49.9 kg)    SpO2 100%    BMI 22.99 kg/m    Physical Exam Vitals and nursing note reviewed. Exam conducted with a chaperone present.  Constitutional:      General: She is not in acute distress.    Appearance: Normal appearance. She is overweight. She is not ill-appearing, toxic-appearing or diaphoretic.  HENT:     Head: Normocephalic and atraumatic.     Right Ear: Tympanic membrane, ear canal and external ear normal. There is no impacted cerumen.     Left Ear: Tympanic membrane, ear canal and external ear normal. There is no impacted cerumen.     Nose: Congestion and rhinorrhea present.     Mouth/Throat:     Mouth: Mucous membranes are moist.     Pharynx: Oropharynx is clear. No oropharyngeal exudate or posterior oropharyngeal erythema.  Eyes:     Conjunctiva/sclera:     Right eye: Right conjunctiva is injected.   Cardiovascular:     Rate and Rhythm: Normal rate and regular rhythm.     Pulses: Normal pulses.     Heart sounds: Normal heart sounds. No murmur heard.   No friction rub. No gallop.  Pulmonary:     Effort: Pulmonary effort is normal. No respiratory distress.     Breath sounds: Normal breath sounds. No stridor. No wheezing, rhonchi or rales.   Chest:     Chest wall: No tenderness.  Musculoskeletal:        General: No swelling, tenderness, deformity or signs of injury. Normal range of motion.     Right lower leg: No edema.     Left lower leg: No edema.  Skin:    General: Skin is warm and dry.     Capillary Refill: Capillary refill takes less than 2 seconds.     Coloration: Skin is not jaundiced or pale.     Findings: No bruising, erythema, lesion or rash.  Neurological:     General: No focal deficit present.     Mental Status: She is alert and  oriented to person, place, and time. Mental status is at baseline.     Cranial Nerves: No cranial nerve deficit.     Sensory: No sensory deficit.     Motor: No weakness.     Coordination: Coordination normal.  Psychiatric:        Mood and Affect: Mood normal.        Behavior: Behavior normal.        Thought Content: Thought content normal.        Judgment: Judgment normal.      Results for orders placed or performed in visit on 02/07/21  POCT urinalysis dipstick  Result Value Ref Range   Color, UA yellow    Clarity, UA clear    Glucose, UA Negative Negative   Bilirubin, UA Negative    Ketones, UA Negative    Spec Grav, UA 1.010 1.010 - 1.025   Blood, UA tace    pH, UA 6.5 5.0 - 8.0   Protein, UA Negative Negative   Urobilinogen, UA 0.2 0.2 or 1.0 E.U./dL   Nitrite, UA Negative    Leukocytes, UA Negative Negative    Assessment & Plan     Problem List Items Addressed This Visit       Other   Intellectual disability    With chaperone Pt answers in yes/no  Unable to differentiate response to CVA tenderness on exam      Foul smelling urine - Primary    UA negative Will send for culture D/t cognitive deficit- unable to illicit response from CVA exam      Relevant Orders   POCT urinalysis dipstick (Completed)   Acute cough    Cough syrup provided to assist; LCTAB No sick contacts      Relevant Medications   guaiFENesin-dextromethorphan (ROBITUSSIN DM)  100-10 MG/5ML syrup   Rhinorrhea    Crusted, mucous on nose Encouraged use of moisture with nasal spray to assist with congestion and dried mucous      Relevant Medications   sodium chloride (OCEAN) 0.65 % SOLN nasal spray     Return if symptoms worsen or fail to improve.      Vonna Kotyk, FNP, have reviewed all documentation for this visit. The documentation on 02/07/21 for the exam, diagnosis, procedures, and orders are all accurate and complete.    Gwyneth Sprout, [place] [phone] (phone) [phone] (fax)  [place]

## 2021-02-11 LAB — URINE CULTURE

## 2021-02-13 ENCOUNTER — Encounter: Payer: Self-pay | Admitting: Emergency Medicine

## 2021-02-13 ENCOUNTER — Emergency Department: Payer: Medicare Other

## 2021-02-13 ENCOUNTER — Other Ambulatory Visit: Payer: Self-pay

## 2021-02-13 ENCOUNTER — Inpatient Hospital Stay
Admission: EM | Admit: 2021-02-13 | Discharge: 2021-03-14 | DRG: 871 | Disposition: E | Payer: Medicare Other | Attending: Internal Medicine | Admitting: Internal Medicine

## 2021-02-13 ENCOUNTER — Ambulatory Visit
Admission: EM | Admit: 2021-02-13 | Discharge: 2021-02-13 | Disposition: A | Discharge status: Other Acute Inpatient Hospital | Payer: Medicare Other

## 2021-02-13 DIAGNOSIS — E870 Hyperosmolality and hypernatremia: Secondary | ICD-10-CM | POA: Diagnosis not present

## 2021-02-13 DIAGNOSIS — Z4682 Encounter for fitting and adjustment of non-vascular catheter: Secondary | ICD-10-CM | POA: Diagnosis not present

## 2021-02-13 DIAGNOSIS — R569 Unspecified convulsions: Secondary | ICD-10-CM

## 2021-02-13 DIAGNOSIS — E876 Hypokalemia: Secondary | ICD-10-CM | POA: Diagnosis not present

## 2021-02-13 DIAGNOSIS — M1990 Unspecified osteoarthritis, unspecified site: Secondary | ICD-10-CM | POA: Diagnosis present

## 2021-02-13 DIAGNOSIS — Z20822 Contact with and (suspected) exposure to covid-19: Secondary | ICD-10-CM | POA: Diagnosis present

## 2021-02-13 DIAGNOSIS — R404 Transient alteration of awareness: Secondary | ICD-10-CM | POA: Diagnosis not present

## 2021-02-13 DIAGNOSIS — Z515 Encounter for palliative care: Secondary | ICD-10-CM

## 2021-02-13 DIAGNOSIS — R6520 Severe sepsis without septic shock: Secondary | ICD-10-CM | POA: Diagnosis not present

## 2021-02-13 DIAGNOSIS — I4892 Unspecified atrial flutter: Secondary | ICD-10-CM | POA: Diagnosis not present

## 2021-02-13 DIAGNOSIS — J9602 Acute respiratory failure with hypercapnia: Secondary | ICD-10-CM | POA: Diagnosis present

## 2021-02-13 DIAGNOSIS — Z803 Family history of malignant neoplasm of breast: Secondary | ICD-10-CM

## 2021-02-13 DIAGNOSIS — I5023 Acute on chronic systolic (congestive) heart failure: Secondary | ICD-10-CM | POA: Diagnosis present

## 2021-02-13 DIAGNOSIS — Z96649 Presence of unspecified artificial hip joint: Secondary | ICD-10-CM

## 2021-02-13 DIAGNOSIS — R0902 Hypoxemia: Secondary | ICD-10-CM

## 2021-02-13 DIAGNOSIS — Q7649 Other congenital malformations of spine, not associated with scoliosis: Secondary | ICD-10-CM | POA: Diagnosis not present

## 2021-02-13 DIAGNOSIS — R6521 Severe sepsis with septic shock: Secondary | ICD-10-CM | POA: Diagnosis present

## 2021-02-13 DIAGNOSIS — I959 Hypotension, unspecified: Secondary | ICD-10-CM | POA: Diagnosis not present

## 2021-02-13 DIAGNOSIS — F32A Depression, unspecified: Secondary | ICD-10-CM | POA: Diagnosis present

## 2021-02-13 DIAGNOSIS — M25559 Pain in unspecified hip: Secondary | ICD-10-CM | POA: Diagnosis present

## 2021-02-13 DIAGNOSIS — R451 Restlessness and agitation: Secondary | ICD-10-CM | POA: Diagnosis present

## 2021-02-13 DIAGNOSIS — Z96641 Presence of right artificial hip joint: Secondary | ICD-10-CM | POA: Diagnosis present

## 2021-02-13 DIAGNOSIS — R54 Age-related physical debility: Secondary | ICD-10-CM | POA: Diagnosis present

## 2021-02-13 DIAGNOSIS — Z7983 Long term (current) use of bisphosphonates: Secondary | ICD-10-CM

## 2021-02-13 DIAGNOSIS — I517 Cardiomegaly: Secondary | ICD-10-CM | POA: Diagnosis not present

## 2021-02-13 DIAGNOSIS — Z881 Allergy status to other antibiotic agents status: Secondary | ICD-10-CM

## 2021-02-13 DIAGNOSIS — N3090 Cystitis, unspecified without hematuria: Secondary | ICD-10-CM

## 2021-02-13 DIAGNOSIS — G809 Cerebral palsy, unspecified: Secondary | ICD-10-CM | POA: Diagnosis present

## 2021-02-13 DIAGNOSIS — M810 Age-related osteoporosis without current pathological fracture: Secondary | ICD-10-CM | POA: Diagnosis present

## 2021-02-13 DIAGNOSIS — Z170 Estrogen receptor positive status [ER+]: Secondary | ICD-10-CM | POA: Diagnosis not present

## 2021-02-13 DIAGNOSIS — R079 Chest pain, unspecified: Secondary | ICD-10-CM | POA: Diagnosis not present

## 2021-02-13 DIAGNOSIS — T884XXA Failed or difficult intubation, initial encounter: Secondary | ICD-10-CM | POA: Diagnosis not present

## 2021-02-13 DIAGNOSIS — I10 Essential (primary) hypertension: Secondary | ICD-10-CM | POA: Diagnosis not present

## 2021-02-13 DIAGNOSIS — F458 Other somatoform disorders: Secondary | ICD-10-CM

## 2021-02-13 DIAGNOSIS — Z79899 Other long term (current) drug therapy: Secondary | ICD-10-CM

## 2021-02-13 DIAGNOSIS — F79 Unspecified intellectual disabilities: Secondary | ICD-10-CM | POA: Diagnosis not present

## 2021-02-13 DIAGNOSIS — I5021 Acute systolic (congestive) heart failure: Secondary | ICD-10-CM | POA: Diagnosis not present

## 2021-02-13 DIAGNOSIS — J9601 Acute respiratory failure with hypoxia: Secondary | ICD-10-CM

## 2021-02-13 DIAGNOSIS — Z8614 Personal history of Methicillin resistant Staphylococcus aureus infection: Secondary | ICD-10-CM

## 2021-02-13 DIAGNOSIS — J9811 Atelectasis: Secondary | ICD-10-CM

## 2021-02-13 DIAGNOSIS — G319 Degenerative disease of nervous system, unspecified: Secondary | ICD-10-CM | POA: Diagnosis not present

## 2021-02-13 DIAGNOSIS — Z6822 Body mass index (BMI) 22.0-22.9, adult: Secondary | ICD-10-CM

## 2021-02-13 DIAGNOSIS — R4189 Other symptoms and signs involving cognitive functions and awareness: Secondary | ICD-10-CM | POA: Diagnosis present

## 2021-02-13 DIAGNOSIS — F819 Developmental disorder of scholastic skills, unspecified: Secondary | ICD-10-CM | POA: Diagnosis present

## 2021-02-13 DIAGNOSIS — A419 Sepsis, unspecified organism: Secondary | ICD-10-CM | POA: Diagnosis present

## 2021-02-13 DIAGNOSIS — J189 Pneumonia, unspecified organism: Secondary | ICD-10-CM | POA: Diagnosis not present

## 2021-02-13 DIAGNOSIS — R627 Adult failure to thrive: Secondary | ICD-10-CM | POA: Diagnosis present

## 2021-02-13 DIAGNOSIS — A4151 Sepsis due to Escherichia coli [E. coli]: Secondary | ICD-10-CM | POA: Diagnosis not present

## 2021-02-13 DIAGNOSIS — R739 Hyperglycemia, unspecified: Secondary | ICD-10-CM | POA: Diagnosis not present

## 2021-02-13 DIAGNOSIS — G9341 Metabolic encephalopathy: Secondary | ICD-10-CM | POA: Diagnosis present

## 2021-02-13 DIAGNOSIS — C50919 Malignant neoplasm of unspecified site of unspecified female breast: Secondary | ICD-10-CM | POA: Diagnosis present

## 2021-02-13 DIAGNOSIS — Z4659 Encounter for fitting and adjustment of other gastrointestinal appliance and device: Secondary | ICD-10-CM

## 2021-02-13 DIAGNOSIS — Z66 Do not resuscitate: Secondary | ICD-10-CM | POA: Diagnosis not present

## 2021-02-13 DIAGNOSIS — E785 Hyperlipidemia, unspecified: Secondary | ICD-10-CM | POA: Diagnosis present

## 2021-02-13 DIAGNOSIS — N281 Cyst of kidney, acquired: Secondary | ICD-10-CM | POA: Diagnosis not present

## 2021-02-13 DIAGNOSIS — G40909 Epilepsy, unspecified, not intractable, without status epilepticus: Secondary | ICD-10-CM | POA: Diagnosis present

## 2021-02-13 DIAGNOSIS — E669 Obesity, unspecified: Secondary | ICD-10-CM | POA: Diagnosis present

## 2021-02-13 DIAGNOSIS — K219 Gastro-esophageal reflux disease without esophagitis: Secondary | ICD-10-CM | POA: Diagnosis present

## 2021-02-13 DIAGNOSIS — J90 Pleural effusion, not elsewhere classified: Secondary | ICD-10-CM | POA: Diagnosis not present

## 2021-02-13 DIAGNOSIS — I110 Hypertensive heart disease with heart failure: Secondary | ICD-10-CM | POA: Diagnosis present

## 2021-02-13 DIAGNOSIS — Z882 Allergy status to sulfonamides status: Secondary | ICD-10-CM

## 2021-02-13 DIAGNOSIS — D72829 Elevated white blood cell count, unspecified: Secondary | ICD-10-CM

## 2021-02-13 DIAGNOSIS — J984 Other disorders of lung: Secondary | ICD-10-CM | POA: Diagnosis not present

## 2021-02-13 DIAGNOSIS — K449 Diaphragmatic hernia without obstruction or gangrene: Secondary | ICD-10-CM | POA: Diagnosis present

## 2021-02-13 DIAGNOSIS — I700 Atherosclerosis of aorta: Secondary | ICD-10-CM | POA: Diagnosis present

## 2021-02-13 DIAGNOSIS — Z452 Encounter for adjustment and management of vascular access device: Secondary | ICD-10-CM

## 2021-02-13 DIAGNOSIS — R579 Shock, unspecified: Secondary | ICD-10-CM | POA: Diagnosis not present

## 2021-02-13 DIAGNOSIS — W1830XA Fall on same level, unspecified, initial encounter: Secondary | ICD-10-CM | POA: Diagnosis present

## 2021-02-13 DIAGNOSIS — Z743 Need for continuous supervision: Secondary | ICD-10-CM | POA: Diagnosis not present

## 2021-02-13 DIAGNOSIS — F419 Anxiety disorder, unspecified: Secondary | ICD-10-CM | POA: Diagnosis present

## 2021-02-13 DIAGNOSIS — J42 Unspecified chronic bronchitis: Secondary | ICD-10-CM | POA: Diagnosis present

## 2021-02-13 DIAGNOSIS — I480 Paroxysmal atrial fibrillation: Secondary | ICD-10-CM | POA: Diagnosis not present

## 2021-02-13 DIAGNOSIS — Z8701 Personal history of pneumonia (recurrent): Secondary | ICD-10-CM

## 2021-02-13 DIAGNOSIS — J320 Chronic maxillary sinusitis: Secondary | ICD-10-CM | POA: Diagnosis not present

## 2021-02-13 DIAGNOSIS — S0990XA Unspecified injury of head, initial encounter: Secondary | ICD-10-CM | POA: Diagnosis not present

## 2021-02-13 DIAGNOSIS — Z8249 Family history of ischemic heart disease and other diseases of the circulatory system: Secondary | ICD-10-CM

## 2021-02-13 DIAGNOSIS — Z853 Personal history of malignant neoplasm of breast: Secondary | ICD-10-CM

## 2021-02-13 DIAGNOSIS — I6782 Cerebral ischemia: Secondary | ICD-10-CM | POA: Diagnosis not present

## 2021-02-13 DIAGNOSIS — A4902 Methicillin resistant Staphylococcus aureus infection, unspecified site: Secondary | ICD-10-CM | POA: Diagnosis present

## 2021-02-13 DIAGNOSIS — R109 Unspecified abdominal pain: Secondary | ICD-10-CM | POA: Diagnosis not present

## 2021-02-13 DIAGNOSIS — Z01818 Encounter for other preprocedural examination: Secondary | ICD-10-CM

## 2021-02-13 DIAGNOSIS — R0602 Shortness of breath: Secondary | ICD-10-CM

## 2021-02-13 DIAGNOSIS — A4189 Other specified sepsis: Secondary | ICD-10-CM | POA: Diagnosis not present

## 2021-02-13 DIAGNOSIS — D649 Anemia, unspecified: Secondary | ICD-10-CM | POA: Diagnosis present

## 2021-02-13 DIAGNOSIS — G808 Other cerebral palsy: Secondary | ICD-10-CM | POA: Diagnosis not present

## 2021-02-13 DIAGNOSIS — J9691 Respiratory failure, unspecified with hypoxia: Secondary | ICD-10-CM | POA: Diagnosis not present

## 2021-02-13 DIAGNOSIS — R918 Other nonspecific abnormal finding of lung field: Secondary | ICD-10-CM | POA: Diagnosis not present

## 2021-02-13 DIAGNOSIS — S199XXA Unspecified injury of neck, initial encounter: Secondary | ICD-10-CM | POA: Diagnosis not present

## 2021-02-13 DIAGNOSIS — C50512 Malignant neoplasm of lower-outer quadrant of left female breast: Secondary | ICD-10-CM | POA: Diagnosis not present

## 2021-02-13 LAB — BASIC METABOLIC PANEL
Anion gap: 6 (ref 5–15)
BUN: 24 mg/dL — ABNORMAL HIGH (ref 8–23)
CO2: 27 mmol/L (ref 22–32)
Calcium: 9.2 mg/dL (ref 8.9–10.3)
Chloride: 102 mmol/L (ref 98–111)
Creatinine, Ser: 0.88 mg/dL (ref 0.44–1.00)
GFR, Estimated: >60 mL/min (ref >60)
Glucose, Bld: 135 mg/dL — ABNORMAL HIGH (ref 70–99)
Potassium: 4.7 mmol/L (ref 3.5–5.1)
Sodium: 135 mmol/L (ref 135–145)

## 2021-02-13 LAB — URINALYSIS, ROUTINE W REFLEX MICROSCOPIC
Bilirubin Urine: NEGATIVE
Glucose, UA: NEGATIVE mg/dL
Ketones, ur: NEGATIVE mg/dL
Nitrite: POSITIVE — AB
Protein, ur: NEGATIVE mg/dL
Specific Gravity, Urine: 1.018 (ref 1.005–1.030)
pH: 5 (ref 5.0–8.0)

## 2021-02-13 LAB — CBC
HCT: 32.4 % — ABNORMAL LOW (ref 36.0–46.0)
Hemoglobin: 10.1 g/dL — ABNORMAL LOW (ref 12.0–15.0)
MCH: 29.9 pg (ref 26.0–34.0)
MCHC: 31.2 g/dL (ref 30.0–36.0)
MCV: 95.9 fL (ref 80.0–100.0)
Platelets: 266 10*3/uL (ref 150–400)
RBC: 3.38 MIL/uL — ABNORMAL LOW (ref 3.87–5.11)
RDW: 14 % (ref 11.5–15.5)
WBC: 17.5 10*3/uL — ABNORMAL HIGH (ref 4.0–10.5)
nRBC: 0 % (ref 0.0–0.2)

## 2021-02-13 LAB — LACTIC ACID, PLASMA: Lactic Acid, Venous: 1.9 mmol/L (ref 0.5–1.9)

## 2021-02-13 LAB — RESP PANEL BY RT-PCR (FLU A&B, COVID) ARPGX2
Influenza A by PCR: NEGATIVE
Influenza B by PCR: NEGATIVE
SARS Coronavirus 2 by RT PCR: NEGATIVE

## 2021-02-13 MED ORDER — ENOXAPARIN SODIUM 40 MG/0.4ML IJ SOSY
40.0000 mg | PREFILLED_SYRINGE | INTRAMUSCULAR | Status: DC
Start: 2021-02-13 — End: 2021-02-20
  Administered 2021-02-13 – 2021-02-19 (×7): 40 mg via SUBCUTANEOUS
  Filled 2021-02-13 (×7): qty 0.4

## 2021-02-13 MED ORDER — DULOXETINE HCL 30 MG PO CPEP
60.0000 mg | ORAL_CAPSULE | Freq: Every day | ORAL | Status: DC
  Administered 2021-02-14: 60 mg via ORAL
  Filled 2021-02-13: qty 2
  Filled 2021-02-13: qty 1
  Filled 2021-02-13 (×3): qty 2

## 2021-02-13 MED ORDER — SODIUM CHLORIDE 0.9 % IV SOLN
2.0000 g | INTRAVENOUS | Status: DC
  Administered 2021-02-14: 2 g via INTRAVENOUS
  Filled 2021-02-13 (×2): qty 20

## 2021-02-13 MED ORDER — SODIUM CHLORIDE 0.9 % IV BOLUS
1000.0000 mL | Freq: Once | INTRAVENOUS | Status: AC
  Administered 2021-02-13: 1000 mL via INTRAVENOUS

## 2021-02-13 MED ORDER — DULOXETINE HCL 30 MG PO CPEP
30.0000 mg | ORAL_CAPSULE | Freq: Every day | ORAL | Status: DC
  Administered 2021-02-14: 30 mg via ORAL
  Filled 2021-02-13 (×6): qty 1

## 2021-02-13 MED ORDER — SODIUM CHLORIDE 0.9 % IV SOLN
500.0000 mg | INTRAVENOUS | Status: DC
  Administered 2021-02-14 – 2021-02-15 (×2): 500 mg via INTRAVENOUS
  Filled 2021-02-13 (×2): qty 5

## 2021-02-13 MED ORDER — SODIUM CHLORIDE 0.9 % IV BOLUS (SEPSIS)
1000.0000 mL | Freq: Once | INTRAVENOUS | Status: AC
  Administered 2021-02-13: 1000 mL via INTRAVENOUS

## 2021-02-13 MED ORDER — LAMOTRIGINE 25 MG PO TABS: 150.0000 mg | ORAL_TABLET | Freq: Two times a day (BID) | ORAL | Status: DC

## 2021-02-13 MED ORDER — MIDODRINE HCL 5 MG PO TABS
10.0000 mg | ORAL_TABLET | Freq: Once | ORAL | Status: AC
  Administered 2021-02-13: 10 mg via ORAL
  Filled 2021-02-13: qty 2

## 2021-02-13 MED ORDER — ACETAMINOPHEN 650 MG RE SUPP: 650.0000 mg | Freq: Four times a day (QID) | RECTAL | Status: DC | PRN

## 2021-02-13 MED ORDER — LEVETIRACETAM 250 MG PO TABS
250.0000 mg | ORAL_TABLET | Freq: Two times a day (BID) | ORAL | Status: DC
  Filled 2021-02-13: qty 1

## 2021-02-13 MED ORDER — SODIUM CHLORIDE 0.9 % IV SOLN
2.0000 g | Freq: Once | INTRAVENOUS | Status: AC
  Administered 2021-02-13: 2 g via INTRAVENOUS
  Filled 2021-02-13: qty 20

## 2021-02-13 MED ORDER — ONDANSETRON HCL 4 MG/2ML IJ SOLN: 4.0000 mg | Freq: Four times a day (QID) | INTRAMUSCULAR | Status: DC | PRN

## 2021-02-13 MED ORDER — LORAZEPAM 0.5 MG PO TABS: 0.5000 mg | ORAL_TABLET | Freq: Three times a day (TID) | ORAL | Status: DC | PRN

## 2021-02-13 MED ORDER — SODIUM CHLORIDE 0.9 % IV SOLN
500.0000 mg | Freq: Once | INTRAVENOUS | Status: AC
  Administered 2021-02-13: 500 mg via INTRAVENOUS
  Filled 2021-02-13: qty 5

## 2021-02-13 MED ORDER — ACETAMINOPHEN 325 MG PO TABS: 650.0000 mg | ORAL_TABLET | Freq: Four times a day (QID) | ORAL | Status: DC | PRN

## 2021-02-13 MED ORDER — ONDANSETRON HCL 4 MG PO TABS: 4.0000 mg | ORAL_TABLET | Freq: Four times a day (QID) | ORAL | Status: DC | PRN

## 2021-02-13 MED ORDER — LORAZEPAM 1 MG PO TABS: 1.5000 mg | ORAL_TABLET | ORAL | Status: DC

## 2021-02-13 MED ORDER — LAMOTRIGINE 100 MG PO TABS
300.0000 mg | ORAL_TABLET | Freq: Two times a day (BID) | ORAL | Status: DC
  Administered 2021-02-13 – 2021-02-14 (×2): 300 mg via ORAL
  Filled 2021-02-13 (×3): qty 3

## 2021-02-13 NOTE — ED Notes (Signed)
Patient is being discharged from the Urgent Care and sent to the Emergency Department via EMS . Per Eli Hose, PA, patient is in need of higher level of care due to Fall, head injury, hypotension. Patient is aware and verbalizes understanding of plan of care.  Vitals:   02/14/2021 1051  BP: (!) 84/40  Pulse: 84  Resp: 16  Temp: 98.5 F (36.9 C)  SpO2: 91%

## 2021-02-13 NOTE — ED Triage Notes (Signed)
Pt caregiver fell this morning flat on her back and hit her head. Caregiver states she is has been very off balance since the fall. Pt is non verbal.

## 2021-02-13 NOTE — H&P (Signed)
History and Physical   Helen Patton KMQ:[id number] DOB: Feb 24, 1960 DOA: 02/15/2021  PCP: Jerrol Banana., MD  Outpatient Specialists: Dr. Manuella Ghazi, neurology Patient coming from: group home, [facility], to urgent care center to [facility] emergency department  I have personally briefly reviewed patient's old medical records in [facility].  Chief Concern: Fall and balance  HPI: Helen Patton is a 61 y.o. female with medical history significant for learning and intellectual disability, hypertension, seizures, cognitive decline, history of left lower breast cancer ER positive, who presents emergency department for chief concerns of a fall and imbalance.  She was getting out of group home van when she fell backwards. She was not balance with her walking.   Pat, care giver at bedside, who states that she has not noticed any fever, vomiting, diarrhea. Patient had a cough two weeks ago, that was productive of white phlegm.   Patient normally does not wear O2 supplementation.   Social history: She lives in [place]. Her biological mother is guardian. She does not have history of tobacco, etoh, recreational drug use.   Vaccination history: She is vaccinated for covid 19, including all her treatment dose and boosters  ROS: Unable to complete as patient is mostly nonverbal and has intellectual and learning disability with cognitive decline  ED Course: Discussed with emergency medicine provider, patient requiring hospitalization for chief concerns of multifocal pneumonia with hypotension.  Vitals in the emergency department showed temperature of 97.8, respiration rate of 16, initial blood pressure 84/40 and improved to 97/52, patient had an episode of hypoxia with SPO2 of 85% to 89% on room air.  Patient was placed on 4 L nasal cannula with improvement to 95%.  Labs in the emergency department showed serum sodium of 135, potassium 4.7, chloride 102, bicarb 27, BUN of 24,  serum creatinine of 0.88, nonfasting blood glucose 135, WBC elevated at 17.5, hemoglobin 10.1, platelets of 266.  Lactic acid was not elevated at 1.9.  COVID/influenza A/influenza B PCR were negative.  UA was positive for small leukocytes, and nitrates is positive.  In the emergency department patient received sodium chloride 1 L bolus, ceftriaxone 2 g IV, azithromycin 500 milligrams IV.  Assessment/Plan  Principal Problem:   Severe sepsis with acute organ dysfunction (HCC) Active Problems:   Intellectual disability   Breast cancer (HCC)   Anxiety   Arthralgia of hip   Cognitive decline   Clinical depression   Acid reflux   HLD (hyperlipidemia)   Methicillin resistant Staphylococcus aureus infection   Seizure (HCC)   S/P total hip arthroplasty   # Patient met sepsis criteria with septic shock # Multifocal pneumonia # Acute hypoxic respiratory distress - Midodrine 10 mg p.o. once - Currently on 4 L [place] - Increased respiration rate, leukocytosis, source of pneumonia versus urine - Continue azithromycin and ceftriaxone - Blood cultures x2 are in process - Follow-up with urine culture - Check MRSA PCR - Admit to progressive cardiac unit, inpatient, with telemetry  # History of seizures - Seizure precaution - Resumed home lamotrigine 300 mg p.o. twice daily  # History of hypertension-patient is currently hypotensive therefore I have not resumed losartan 50 mg daily, amlodipine 10 mg daily  # Depression-duloxetine 90 mg daily resumed  # GERD-PPI  # Anxiety-lorazepam 1 mg every morning and 0.5 mg by mouth at noon and 0.5 mg by mouth at bedtime  # Left lower outer breast cancer ER positive - Per med reconciliation patient is  no longer on Femara  Chart reviewed.   11/09/2020: Patient is seen by neurologist, Dr. Trena Platt PA: It was noted that patient should continue taking Lamictal 300 mg p.o. twice daily however I do not see  DVT prophylaxis: Enoxaparin 40 mg subcutaneous  every 24 hours Code Status: Full code Diet:  Family Communication: Updated Pat, primary care giver Disposition Plan: Pending clinical course Consults called: None at this time Admission status: Inpatient, progressive cardiac, telemetry  Past Medical History:  Diagnosis Date   Anxiety    Arthritis    osteoarthritis   Breast cancer ([place]) 2015   [name]   Cancer ([place]) 11/2013   T1c, Nx; ER/ PR +; Her 2 neg invasive mammary carcinoma left breast. Wide excision only based on Tumor Board review.    Cataract    Depression    Difficulty swallowing    Falls    GERD (gastroesophageal reflux disease)    Hidradenitis 2012   MRSA, on chronic topical suppression   History of hiatal hernia    Hyperlipidemia    Hypertension    Mental retardation    MRSA (methicillin resistant staph aureus) culture positive 2012   Osteoporosis    Pneumonia    aspiration pneumonia   Seizures ([place])    last seizure February 2017   Past Surgical History:  Procedure Laterality Date   BREAST LUMPECTOMY Left 12/07/2013   T1b, NX, ER/PR positive, HER-2/neu not overexpressed, 7 mm.  Not candidate for chemotherapy/sentinel node/radiation based on mental retardation.   BREAST SURGERY Left 12/07/2013   T1b, Nx; ER+, PR+, her 2 not amplified. 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 HIP  08/23/2015   Procedure: CONVERSION TO TOTAL HIP;  Surgeon: Dereck Leep, MD;  Location: [facility] ORS;  Service: Orthopedics;;   dislocated hip     HARDWARE REMOVAL  08/23/2015   Procedure: HARDWARE REMOVAL;  Surgeon: Dereck Leep, MD;  Location: [facility] ORS;  Service: Orthopedics;;   TOTAL HIP REVISION Right 08/23/2015   Procedure: TOTAL HIP REVISION;  Surgeon: Dereck Leep, MD;  Location: [facility] ORS;  Service: Orthopedics;  Laterality: Right;   Social History:  reports that she has never smoked. She has never used smokeless tobacco.  She reports that she does not drink alcohol and does not use drugs.  Allergies  Allergen Reactions   Sulfa Antibiotics Other (See Comments)    "Does not know"   Keflex [Cephalexin] Other (See Comments)    "Does not know" unknown   Family History  Problem Relation Age of Onset   Diabetes Father    Parkinson's disease Father    Neuropathy Mother    Arthritis Mother    Diabetes Mother    Autoimmune disease Brother        AIDS   Cancer Maternal Aunt 16       breast   Breast cancer Maternal Aunt    Hypertension Maternal Grandmother    Cancer Paternal Grandmother    Family history: Family history reviewed and not pertinent.  Prior to Admission medications   Medication Sig Start Date End Date Taking? Authorizing Provider  acetaminophen (TYLENOL) 325 MG tablet Take 2 tablets (650 mg total) by mouth every 6 (six) hours as needed. 07/14/17   Jerrol Banana., MD  alendronate (FOSAMAX) 70 MG tablet TAKE 1 TABLET  BY MOUTH WEEKLY EARLY MORNING BEFORE FOOD/MEDS WITH WATER.  DO NOT LIE DOWN FOR 30 MINUTES 09/03/18   Jerrol Banana., MD  amLODipine (NORVASC) 10 MG tablet TAKE 1 TABLET BY MOUTH AT BEDTIME 09/03/18   Jerrol Banana., MD  BD ECLIPSE SYRINGE 25G X 1" 3 ML MISC FOR USE WITH B12 10/02/20   Jerrol Banana., MD  BENZOYL PEROXIDE 5 % external wash USE TOPICALLY ONCE DAILY 10/27/19   Jerrol Banana., MD  calcium-vitamin D (OSCAL WITH D) 500-200 MG-UNIT TABS tablet TAKE 1 TABLET BY MOUTH EVERY DAY WITH BREAKFAST 11/03/18   Jerrol Banana., MD  cetirizine (ZYRTEC) 10 MG tablet Take 1 tablet (10 mg total) by mouth daily. 12/23/18   Jerrol Banana., MD  clotrimazole (LOTRIMIN) 1 % cream APPLY TOPICALLY TWICE A DAY AS NEEDED FOR RASH IN SKIN FOLDS 02/05/17   Jerrol Banana., MD  cyanocobalamin (,VITAMIN B-12,) 1000 MCG/ML injection INJECT 1ML=1000MCG INTRAMUSCULARLY EVERY MONTH (B-12 SUPPLEMENT) 10/02/20   Jerrol Banana., MD   diphenhydrAMINE (BENADRYL) 12.5 MG chewable tablet Chew 1 tablet (12.5 mg total) by mouth 3 (three) times daily. 04/02/18   Jerrol Banana., MD  docusate sodium (COLACE) 100 MG capsule TAKE 1 CAPSULE BY MOUTH 2 TIMES PER DAY 09/03/18   Jerrol Banana., MD  DULoxetine (CYMBALTA) 30 MG capsule Take 30 mg by mouth daily.    [provider]  DULoxetine (CYMBALTA) 60 MG capsule Take 1 capsule (60 mg total) by mouth daily. 04/10/18   Jerrol Banana., MD  guaiFENesin-dextromethorphan [facility] DM) 100-10 MG/5ML syrup Take 5 mLs by mouth every 4 (four) hours as needed for cough. 02/07/21   Gwyneth Sprout, FNP  ipratropium (ATROVENT) 0.03 % nasal spray Place 2 sprays into both nostrils 3 (three) times daily. 02/24/18   Virginia Crews, MD  lamoTRIgine (LAMICTAL) 150 MG tablet TAKE 2 TABLETS BY MOUTH 2 TIMES PER DAY 09/03/18   Jerrol Banana., MD  letrozole [facility]) 2.5 MG tablet TAKE 1 TABLET BY MOUTH ONCE DAILY *HAZARDOUS DRUG: WEAR GLOVES* Patient not taking: Reported on 10/17/2020 09/25/19   Jerrol Banana., MD  levETIRAcetam (KEPPRA) 250 MG tablet Take 1 tablet (250 mg total) by mouth 2 (two) times daily for 2 days. Then stop. Patient not taking: No sig reported 03/01/19 03/03/19  Lavina Hamman, MD  LORazepam (ATIVAN) 0.5 MG tablet Take 0.5 mg by mouth 2 (two) times daily. Tice a day at noon and at bedtime    [provider]  LORazepam (ATIVAN) 1 MG tablet Take 1 tablet by mouth every morning, and 1/2 tablet at noon and bedtime. Patient taking differently: 1 mg. Take 1 tablet by mouth every morning 12/03/18   Jerrol Banana., MD  losartan (COZAAR) 50 MG tablet TAKE 2 TABLETS (100 MG) BY MOUTH ONCE DAILY 04/11/20   Jerrol Banana., MD  ondansetron (ZOFRAN ODT) 4 MG disintegrating tablet Take 1 tablet (4 mg total) by mouth every 8 (eight) hours as needed for nausea or vomiting. 06/06/20   Brunetta Jeans, PA-C  pantoprazole (PROTONIX) 40 MG  tablet TAKE 1 TABLET BY MOUTH 2 TIMES PER DAY **DO NOT CRUSH** 12/23/18   Jerrol Banana., MD  senna (SENOKOT) 8.6 MG TABS tablet Take 1 tablet (8.6 mg total) by mouth 2 (two) times daily. 03/01/19   Lavina Hamman, MD  sodium chloride (OCEAN) 0.65 % SOLN nasal spray Place 1 spray into  both nostrils as needed for congestion. 02/07/21   Gwyneth Sprout, FNP  terbinafine (LAMISIL) 1 % cream USE 2 TIMES A WEEK AS NEEDED 01/22/21   Birdie Sons, MD  THERAPEUTIC SHAMPOO 0.5 % shampoo APPLY TOPICALLY AT BEDTIME AS NEEDED 11/16/20   Jerrol Banana., MD  triamcinolone (KENALOG) 0.025 % ointment APPLY 1 APPLICATION TOPICALLY TWICE A DAY AS NEEDED 01/22/21   Birdie Sons, MD   Physical Exam: Vitals:   03/13/2021 1815 03/13/2021 1830 03/06/2021 1845 03/06/2021 1900  BP:  (!) 96/51  (!) 116/55  Pulse:  86  88  Resp:  18 16   Temp:      TempSrc:      SpO2: 95% 96% 98% 96%   Constitutional: appears older than chronological age, frail, NAD, calm, comfortable Eyes: PERRL, lids and conjunctivae normal ENMT: Mucous membranes are moist. Posterior pharynx clear of any exudate or lesions. Age-appropriate dentition.  Unable to assess hearing Neck: normal, supple, no masses, no thyromegaly Respiratory: clear to auscultation bilaterally, no wheezing, no crackles. Normal respiratory effort. No accessory muscle use.  Cardiovascular: Regular rate and rhythm, no murmurs / rubs / gallops. No extremity edema. 2+ pedal pulses. No carotid bruits.  Abdomen: Obese abdomen, no tenderness, no masses palpated, no hepatosplenomegaly. Bowel sounds positive.  Musculoskeletal: no clubbing / cyanosis. No joint deformity upper and lower extremities. Good ROM, no contractures, no atrophy. Normal muscle tone.  Skin: no rashes, lesions, ulcers. No induration Neurologic: Sensation intact. Strength 5/5 in all 4.  Psychiatric: Lacks judgment and insight.  Awake and alert.  Patient not able to tell me her full name, her age,  she does not know where she is.. Normal mood.   EKG: independently reviewed, showing sinus rhythm with a rate of 80, QTc 431  Chest x-ray on Admission: I personally reviewed and I agree with radiologist reading as below.  DG Chest 2 View  Result Date: 03/03/2021 CLINICAL DATA:  Hypotensive.  Evaluate infiltrate. EXAM: CHEST - 2 VIEW COMPARISON:  12/24/2019 FINDINGS: Lateral view degraded by patient arm position. Patient rotated minimally left on the frontal radiograph. Mild cardiomegaly. Atherosclerosis in the transverse aorta. Small left and probable small right pleural effusions. No pneumothorax. Bilateral, lower lung predominant interstitial and airspace disease. Relative sparing of the left upper lung. IMPRESSION: Development of multifocal interstitial and airspace disease with small left and probable small right pleural effusion. Favor multifocal pneumonia over pulmonary edema. Aortic Atherosclerosis (ICD10-I70.0). Electronically Signed   By: Abigail Miyamoto M.D.   On: 02/27/2021 13:19   CT HEAD WO CONTRAST  Result Date: 03/07/2021 CLINICAL DATA:  Head trauma, moderate/severe. Neck trauma, focal neuro deficit or paresthesia. Additional history provided: Fall this morning (hitting head). Caregiver reports recent balance difficulty. EXAM: CT HEAD WITHOUT CONTRAST CT CERVICAL SPINE WITHOUT CONTRAST TECHNIQUE: Multidetector CT imaging of the head and cervical spine was performed following the standard protocol without intravenous contrast. Multiplanar CT image reconstructions of the cervical spine were also generated. COMPARISON:  Prior head CT examinations 04/07/2020 and earlier. Radiographs of the cervical spine 05/10/2009. FINDINGS: CT HEAD FINDINGS Brain: Mild cerebral atrophy. Nonspecific prominence of the atria and occipital horns of the lateral ventricles, stable as compared to the head CT of 04/07/2020. The there is no dilation of the temporal horns to suggest hydrocephalus. "Empty" appearance of the  sella turcica, also unchanged. Mild patchy and ill-defined hypoattenuation within the cerebral white matter, nonspecific but compatible with chronic small vessel ischemic disease. There is no  acute intracranial hemorrhage. No demarcated cortical infarct. No extra-axial fluid collection. No evidence of an intracranial mass. No midline shift. Vascular: No hyperdense vessel.  Atherosclerotic calcifications. Skull: Normal. Negative for fracture or focal lesion. Sinuses/Orbits: Visualized orbits show no acute finding. Unchanged partial opacification of the right ethmoid air cells with nonspecific polypoid soft tissue extending from this region inferiorly into the right nasal passage. Similar to the prior examination, there is a thinned appearance of the lateral lamella of the cribriform plate (series 4, image 18). Mild-to-moderate mucosal thickening within the visualized right maxillary sinus. Mild mucosal thickening within the visualized left maxillary sinus. CT CERVICAL SPINE FINDINGS Alignment: Straightening of the expected cervical lordosis. No significant spondylolisthesis. Skull base and vertebrae: The basion-dental and atlanto-dental intervals are maintained.No evidence of acute fracture to the cervical spine. Congenital nonunion of the posterior arch of C1. Soft tissues and spinal canal: No prevertebral fluid or swelling. No visible canal hematoma. Disc levels: No significant bony spinal canal or neural foraminal narrowing at any level. Upper chest: No consolidation within the imaged lung apices. No visible pneumothorax. Partially imaged right pleural effusion. IMPRESSION: CT head: 1. No evidence of acute intracranial abnormality. 2. Mild chronic small vessel ischemic changes within the cerebral white matter. 3. Mild cerebral atrophy. 4. As before, there is partial opacification of the right ethmoid air cells with nonspecific polypoid soft tissue extending from this region into the right nasal passage. Associated  thinned appearance of the lateral lamella of the cribriform plate on the right. Dehiscence at this site cannot be excluded, and a non-emergent maxillofacial CT is recommended for further evaluation. 5. Right greater than left maxillary sinus disease at the imaged levels, as described. CT cervical spine: 1. No evidence of acute fracture to the cervical spine. 2. Straightening of the expected cervical lordosis. 3. Partially imaged right pleural effusion. Electronically Signed   By: Kellie Simmering D.O.   On: 02/20/2021 13:20   CT Cervical Spine Wo Contrast  Result Date: 02/12/2021 CLINICAL DATA:  Head trauma, moderate/severe. Neck trauma, focal neuro deficit or paresthesia. Additional history provided: Fall this morning (hitting head). Caregiver reports recent balance difficulty. EXAM: CT HEAD WITHOUT CONTRAST CT CERVICAL SPINE WITHOUT CONTRAST TECHNIQUE: Multidetector CT imaging of the head and cervical spine was performed following the standard protocol without intravenous contrast. Multiplanar CT image reconstructions of the cervical spine were also generated. COMPARISON:  Prior head CT examinations 04/07/2020 and earlier. Radiographs of the cervical spine 05/10/2009. FINDINGS: CT HEAD FINDINGS Brain: Mild cerebral atrophy. Nonspecific prominence of the atria and occipital horns of the lateral ventricles, stable as compared to the head CT of 04/07/2020. The there is no dilation of the temporal horns to suggest hydrocephalus. "Empty" appearance of the sella turcica, also unchanged. Mild patchy and ill-defined hypoattenuation within the cerebral white matter, nonspecific but compatible with chronic small vessel ischemic disease. There is no acute intracranial hemorrhage. No demarcated cortical infarct. No extra-axial fluid collection. No evidence of an intracranial mass. No midline shift. Vascular: No hyperdense vessel.  Atherosclerotic calcifications. Skull: Normal. Negative for fracture or focal lesion.  Sinuses/Orbits: Visualized orbits show no acute finding. Unchanged partial opacification of the right ethmoid air cells with nonspecific polypoid soft tissue extending from this region inferiorly into the right nasal passage. Similar to the prior examination, there is a thinned appearance of the lateral lamella of the cribriform plate (series 4, image 18). Mild-to-moderate mucosal thickening within the visualized right maxillary sinus. Mild mucosal thickening within the visualized left maxillary  sinus. CT CERVICAL SPINE FINDINGS Alignment: Straightening of the expected cervical lordosis. No significant spondylolisthesis. Skull base and vertebrae: The basion-dental and atlanto-dental intervals are maintained.No evidence of acute fracture to the cervical spine. Congenital nonunion of the posterior arch of C1. Soft tissues and spinal canal: No prevertebral fluid or swelling. No visible canal hematoma. Disc levels: No significant bony spinal canal or neural foraminal narrowing at any level. Upper chest: No consolidation within the imaged lung apices. No visible pneumothorax. Partially imaged right pleural effusion. IMPRESSION: CT head: 1. No evidence of acute intracranial abnormality. 2. Mild chronic small vessel ischemic changes within the cerebral white matter. 3. Mild cerebral atrophy. 4. As before, there is partial opacification of the right ethmoid air cells with nonspecific polypoid soft tissue extending from this region into the right nasal passage. Associated thinned appearance of the lateral lamella of the cribriform plate on the right. Dehiscence at this site cannot be excluded, and a non-emergent maxillofacial CT is recommended for further evaluation. 5. Right greater than left maxillary sinus disease at the imaged levels, as described. CT cervical spine: 1. No evidence of acute fracture to the cervical spine. 2. Straightening of the expected cervical lordosis. 3. Partially imaged right pleural effusion.  Electronically Signed   By: Kellie Simmering D.O.   On: 03/05/2021 13:20    Labs on Admission: I have personally reviewed following labs  CBC: Recent Labs  Lab 02/26/2021 1216  WBC 17.5*  HGB 10.1*  HCT 32.4*  MCV 95.9  PLT 621   Basic Metabolic Panel: Recent Labs  Lab 03/13/2021 1216  NA 135  K 4.7  CL 102  CO2 27  GLUCOSE 135*  BUN 24*  CREATININE 0.88  CALCIUM 9.2   GFR: Estimated Creatinine Clearance: 45.2 mL/min (by C-G formula based on SCr of 0.88 mg/dL).  Urine analysis:    Component Value Date/Time   COLORURINE YELLOW (A) 02/18/2021 1207   APPEARANCEUR HAZY (A) 02/19/2021 1207   APPEARANCEUR Clear 01/08/2013 1742   LABSPEC 1.018 02/28/2021 1207   LABSPEC 1.023 01/08/2013 1742   PHURINE 5.0 03/09/2021 1207   GLUCOSEU NEGATIVE 02/27/2021 1207   GLUCOSEU Negative 01/08/2013 1742   HGBUR SMALL (A) 03/04/2021 1207   BILIRUBINUR NEGATIVE 03/12/2021 1207   BILIRUBINUR Negative 02/07/2021 1414   BILIRUBINUR Negative 01/08/2013 1742   KETONESUR NEGATIVE 02/12/2021 1207   PROTEINUR NEGATIVE 03/09/2021 1207   UROBILINOGEN 0.2 02/07/2021 1414   NITRITE POSITIVE (A) 02/14/2021 1207   LEUKOCYTESUR SMALL (A) 03/12/2021 1207   LEUKOCYTESUR Negative 01/08/2013 1742   CRITICAL CARE Performed by: Briant Cedar Shamarie Call  Total critical care time: 35 minutes  Critical care time was exclusive of separately billable procedures and treating other patients.  Critical care was necessary to treat or prevent imminent or life-threatening deterioration.  Critical care was time spent personally by me on the following activities: development of treatment plan with patient and/or surrogate as well as nursing, discussions with consultants, evaluation of patient's response to treatment, examination of patient, obtaining history from patient or surrogate, ordering and performing treatments and interventions, ordering and review of laboratory studies, ordering and review of radiographic studies, pulse  oximetry and re-evaluation of patient's condition.  Dr. Tobie Poet [facility]  If 7PM-7AM, please contact overnight-coverage provider If 7AM-7PM, please contact day coverage provider [email or web address]  02/18/2021, 7:25 PM

## 2021-02-13 NOTE — ED Provider Notes (Signed)
[facility] Provider Note    Event Date/Time   First MD Initiated Contact with Patient 03/01/2021 1523     (approximate)   History   Fall  History is provided by patient's caregiver who witnessed the fall.  Patient is MR at baseline and nonverbal.  HPI  Helen Patton is a 61 y.o. female with a medical history that includes intellectual disability, CP, seizure disorder, and breast cancer history, presents to the ED after witnessed mechanical fall.  According to the patient's caregiver, she fell hitting her head, climbing into the transport van.  Patient presented to the ED via EMS and was found to have soft blood pressure on presentation as well as room air O2 sats in the mid to high 80s.  Patient apparently had mild persistent cough last week, and some concern for UTI at that time which was evaluated and found to be negative.  No intermittent reports of fevers or noted this time.   Physical Exam   Triage Vital Signs: ED Triage Vitals [03/08/2021 1216]  Enc Vitals Group     BP (!) 89/42     Pulse Rate 76     Resp 20     Temp 97.8 F (36.6 C)     Temp Source Oral     SpO2 95 %     Weight      Height      Head Circumference      Peak Flow      Pain Score      Pain Loc      Pain Edu?      Excl. in [place]?     Most recent vital signs: Vitals:   02/14/2021 1845 02/28/2021 1900  BP:  (!) 116/55  Pulse:  88  Resp: 16   Temp:    SpO2: 98% 96%    General: Awake, no distress.  CV:  Good peripheral perfusion. No BLE edema Resp:  Normal effort. CTA Abd:  No distention. Soft, nontender  ED Results / Procedures / Treatments   Labs (all labs ordered are listed, but only abnormal results are displayed) Labs Reviewed  BASIC METABOLIC PANEL - Abnormal; Notable for the following components:      Result Value   Glucose, Bld 135 (*)    BUN 24 (*)    All other components within normal limits  CBC - Abnormal; Notable for the following components:   WBC  17.5 (*)    RBC 3.38 (*)    Hemoglobin 10.1 (*)    HCT 32.4 (*)    All other components within normal limits  URINALYSIS, ROUTINE W REFLEX MICROSCOPIC - Abnormal; Notable for the following components:   Color, Urine YELLOW (*)    APPearance HAZY (*)    Hgb urine dipstick SMALL (*)    Nitrite POSITIVE (*)    Leukocytes,Ua SMALL (*)    Bacteria, UA RARE (*)    All other components within normal limits  RESP PANEL BY RT-PCR (FLU A&B, COVID) ARPGX2  CULTURE, BLOOD (ROUTINE X 2)  CULTURE, BLOOD (ROUTINE X 2)  URINE CULTURE  MRSA NEXT GEN BY PCR, NASAL  LACTIC ACID, PLASMA  HIV ANTIBODY (ROUTINE TESTING W REFLEX)  BASIC METABOLIC PANEL  MAGNESIUM  PHOSPHORUS  CBC WITH DIFFERENTIAL/PLATELET  PROTIME-INR  CORTISOL-AM, BLOOD  PROCALCITONIN  PROCALCITONIN  VITAMIN B12  CBG MONITORING, ED     EKG  Vent. rate 80 BPM PR interval 142 ms QRS duration 68 ms QT/QTcB  374/431 ms P-R-T axes 45 18 35 Normal sinus rhythm  No STEMI   RADIOLOGY  CXR  IMPRESSION: Development of multifocal interstitial and airspace disease with small left and probable small right pleural effusion. Favor multifocal pneumonia over pulmonary edema.   Aortic Atherosclerosis (ICD10-I70.0).  CT Head / Cervical Spine  IMPRESSION: CT head:   1. No evidence of acute intracranial abnormality. 2. Mild chronic small vessel ischemic changes within the cerebral white matter. 3. Mild cerebral atrophy. 4. As before, there is partial opacification of the right ethmoid air cells with nonspecific polypoid soft tissue extending from this region into the right nasal passage. Associated thinned appearance of the lateral lamella of the cribriform plate on the right. Dehiscence at this site cannot be excluded, and a non-emergent maxillofacial CT is recommended for further evaluation. 5. Right greater than left maxillary sinus disease at the imaged levels, as described.   CT cervical spine:   1. No evidence  of acute fracture to the cervical spine. 2. Straightening of the expected cervical lordosis. 3. Partially imaged right pleural effusion  PROCEDURES:  Critical Care performed: No  Procedures   MEDICATIONS ORDERED IN ED: Medications  acetaminophen (TYLENOL) tablet 650 mg (has no administration in time range)    Or  acetaminophen (TYLENOL) suppository 650 mg (has no administration in time range)  ondansetron (ZOFRAN) tablet 4 mg (has no administration in time range)    Or  ondansetron (ZOFRAN) injection 4 mg (has no administration in time range)  enoxaparin (LOVENOX) injection 40 mg (has no administration in time range)  cefTRIAXone (ROCEPHIN) 2 g in sodium chloride 0.9 % 100 mL IVPB (has no administration in time range)  azithromycin (ZITHROMAX) 500 mg in sodium chloride 0.9 % 250 mL IVPB (has no administration in time range)  DULoxetine (CYMBALTA) DR capsule 30 mg (has no administration in time range)  DULoxetine (CYMBALTA) DR capsule 60 mg (has no administration in time range)  LORazepam (ATIVAN) tablet 1.5 mg (has no administration in time range)  lamoTRIgine (LAMICTAL) tablet 300 mg (has no administration in time range)  sodium chloride 0.9 % bolus 1,000 mL ( Intravenous Restarted 02/24/2021 1719)  cefTRIAXone (ROCEPHIN) 2 g in sodium chloride 0.9 % 100 mL IVPB (0 g Intravenous Stopped 02/17/2021 1800)  azithromycin (ZITHROMAX) 500 mg in sodium chloride 0.9 % 250 mL IVPB (0 mg Intravenous Stopped 03/13/2021 1830)  sodium chloride 0.9 % bolus 1,000 mL (1,000 mLs Intravenous New Bag/Given 03/13/2021 1902)  midodrine (PROAMATINE) tablet 10 mg (10 mg Oral Given 03/05/2021 1857)     IMPRESSION / MDM / ASSESSMENT AND PLAN / ED COURSE  I reviewed the triage vital signs and the nursing notes.                              Differential diagnosis includes, but is not limited to, viral syndrome, bronchitis including COPD exacerbation, pneumonia, reactive airway disease including asthma, CHF including  exacerbation with or without pulmonary/interstitial edema, pneumothorax, ACS, thoracic trauma, and pulmonary embolism.  The patient is on the cardiac monitor to evaluate for evidence of arrhythmia and/or significant heart rate changes.   Patient with a ED evaluation of injury sustained following mechanical fall.  Patient was evaluated for complaints found to have hypertensive blood pressure readings from initial evaluation as well as hypoxic O2 sats in the mid 80s on room air.  Patient who normally does not have an O2 supplemental oxygen requirement, presents to  the ED for evaluation.  Subsequent chest x-ray did reveal multifocal pneumonia concerning for CAP.  CT imaging of the head and neck were negative for any acute injury related to the mechanical fall.  Patient was further evaluated with basic labs and urine which revealed a white count elevated at 17.5.  On normal initial lactic was found at 1.9, and a UA did reveal some nitrites.  As such blood cultures and urine cultures were ordered and are pending at this time.  Patient started empirically on azithromycin and ceftriaxone for the CAP.  Fluid bolus has been administered and patient is beginning to show normalized BPs at 101/56.  O2 sats are stable at this time at the mid low 90s on 4 L by nasal cannula.  Discussed the case with Dr. Rupert Stacks, who will admit the patient to the hospital service for admission and continued IV antibiotic treatment.  Spoke by phone to the patient's mother, updated her on the plan of care and allowed time for her to formulate questions and offered answers.  Patient is stable at this time with her caregiver Fraser Din, at bedside.  Clinical Course as of 02/11/2021 2013  Tue Feb 13, 2021  1740 Urine Culture WBC elevated at 17.5, lactic normal at 1.9. [JM]  1741 Patient O2 stats were high 80s on 2 L by [place].  Oxygen increased to 4L and sats stable at 94 to 95% [JM]    Clinical Course User Index [JM] Dvid Pendry, Dannielle Karvonen, PA-C      FINAL CLINICAL IMPRESSION(S) / ED DIAGNOSES   Final diagnoses:  Community acquired pneumonia, unspecified laterality  Cystitis  Acute respiratory failure with hypoxia ([place])     Rx / DC Orders   ED Discharge Orders     None        Note:  This document was prepared using Dragon voice recognition software and may include unintentional dictation errors.    Melvenia Needles, PA-C 02/15/2021 2013    Arta Silence, MD 02/12/2021 2300

## 2021-02-13 NOTE — ED Triage Notes (Signed)
Pt comes into the ED via EMS from [facility] urgent care with care giver, witnessed fall today hitting her head, normally nonverbal, MR, pt is at baseline on arrival  82/36 519ml [place] 96/42 SR 86 Temp 98.2 86-87%RA, cold hands

## 2021-02-14 ENCOUNTER — Telehealth: Payer: Self-pay

## 2021-02-14 ENCOUNTER — Inpatient Hospital Stay: Payer: Medicare Other

## 2021-02-14 DIAGNOSIS — R6520 Severe sepsis without septic shock: Secondary | ICD-10-CM | POA: Diagnosis not present

## 2021-02-14 DIAGNOSIS — J9601 Acute respiratory failure with hypoxia: Secondary | ICD-10-CM | POA: Diagnosis present

## 2021-02-14 DIAGNOSIS — A419 Sepsis, unspecified organism: Secondary | ICD-10-CM | POA: Diagnosis not present

## 2021-02-14 LAB — BASIC METABOLIC PANEL
Anion gap: 11 (ref 5–15)
BUN: 20 mg/dL (ref 8–23)
CO2: 23 mmol/L (ref 22–32)
Calcium: 8.2 mg/dL — ABNORMAL LOW (ref 8.9–10.3)
Chloride: 106 mmol/L (ref 98–111)
Creatinine, Ser: 0.63 mg/dL (ref 0.44–1.00)
GFR, Estimated: >60 mL/min (ref >60)
Glucose, Bld: 99 mg/dL (ref 70–99)
Potassium: 3.8 mmol/L (ref 3.5–5.1)
Sodium: 140 mmol/L (ref 135–145)

## 2021-02-14 LAB — BLOOD GAS, ARTERIAL
Acid-base deficit: 0.2 mmol/L (ref 0.0–2.0)
Acid-base deficit: 0.5 mmol/L (ref 0.0–2.0)
Acid-base deficit: 0.9 mmol/L (ref 0.0–2.0)
Bicarbonate: 24.8 mmol/L (ref 20.0–28.0)
Bicarbonate: 24.9 mmol/L (ref 20.0–28.0)
Bicarbonate: 25.3 mmol/L (ref 20.0–28.0)
Delivery systems: POSITIVE
Expiratory PAP: 5
FIO2: 0.4
FIO2: 0.96
FIO2: 1
Inspiratory PAP: 10
O2 Saturation: 76.9 %
O2 Saturation: 87.8 %
O2 Saturation: 82.8 %
Patient temperature: 37
Patient temperature: 37
Patient temperature: 37
pCO2 arterial: 41 mmHg (ref 32.0–48.0)
pCO2 arterial: 43 mmHg (ref 32.0–48.0)
pCO2 arterial: 48 mmHg (ref 32.0–48.0)
pH, Arterial: 7.39 (ref 7.350–7.450)
pH, Arterial: 7.37 (ref 7.350–7.450)
pH, Arterial: 7.33 — ABNORMAL LOW (ref 7.350–7.450)
pO2, Arterial: 42 mmHg — ABNORMAL LOW (ref 83.0–108.0)
pO2, Arterial: 56 mmHg — ABNORMAL LOW (ref 83.0–108.0)
pO2, Arterial: 51 mmHg — ABNORMAL LOW (ref 83.0–108.0)

## 2021-02-14 LAB — CBC WITH DIFFERENTIAL/PLATELET
Abs Immature Granulocytes: 0.07 10*3/uL (ref 0.00–0.07)
Basophils Absolute: 0 10*3/uL (ref 0.0–0.1)
Basophils Relative: 0 %
Eosinophils Absolute: 0 10*3/uL (ref 0.0–0.5)
Eosinophils Relative: 0 %
HCT: 31.4 % — ABNORMAL LOW (ref 36.0–46.0)
Hemoglobin: 10 g/dL — ABNORMAL LOW (ref 12.0–15.0)
Immature Granulocytes: 1 %
Lymphocytes Relative: 12 %
Lymphs Abs: 1.7 10*3/uL (ref 0.7–4.0)
MCH: 30 pg (ref 26.0–34.0)
MCHC: 31.8 g/dL (ref 30.0–36.0)
MCV: 94.3 fL (ref 80.0–100.0)
Monocytes Absolute: 1.8 10*3/uL — ABNORMAL HIGH (ref 0.1–1.0)
Monocytes Relative: 14 %
Neutro Abs: 10 10*3/uL — ABNORMAL HIGH (ref 1.7–7.7)
Neutrophils Relative %: 73 %
Platelets: 299 10*3/uL (ref 150–400)
RBC: 3.33 MIL/uL — ABNORMAL LOW (ref 3.87–5.11)
RDW: 14.3 % (ref 11.5–15.5)
WBC: 13.7 10*3/uL — ABNORMAL HIGH (ref 4.0–10.5)
nRBC: 0 % (ref 0.0–0.2)

## 2021-02-14 LAB — MRSA NEXT GEN BY PCR, NASAL: MRSA by PCR Next Gen: NOT DETECTED

## 2021-02-14 LAB — PROCALCITONIN
Procalcitonin: 0.42 ng/mL
Procalcitonin: <0.1 ng/mL

## 2021-02-14 LAB — HIV ANTIBODY (ROUTINE TESTING W REFLEX): HIV Screen 4th Generation wRfx: NONREACTIVE

## 2021-02-14 LAB — CORTISOL-AM, BLOOD: Cortisol - AM: 22.6 ug/dL (ref 6.7–22.6)

## 2021-02-14 LAB — MAGNESIUM: Magnesium: 1.9 mg/dL (ref 1.7–2.4)

## 2021-02-14 LAB — PROTIME-INR
INR: 1 (ref 0.8–1.2)
Prothrombin Time: 13.5 seconds (ref 11.4–15.2)

## 2021-02-14 LAB — VITAMIN B12: Vitamin B-12: 586 pg/mL (ref 180–914)

## 2021-02-14 LAB — PHOSPHORUS: Phosphorus: 2.1 mg/dL — ABNORMAL LOW (ref 2.5–4.6)

## 2021-02-14 MED ORDER — POTASSIUM PHOSPHATES 15 MMOLE/5ML IV SOLN
15.0000 mmol | Freq: Once | INTRAVENOUS | Status: AC
  Administered 2021-02-14: 15 mmol via INTRAVENOUS
  Filled 2021-02-14: qty 5

## 2021-02-14 MED ORDER — FUROSEMIDE 10 MG/ML IJ SOLN
20.0000 mg | Freq: Once | INTRAMUSCULAR | Status: AC
  Administered 2021-02-14: 20 mg via INTRAVENOUS
  Filled 2021-02-14: qty 4

## 2021-02-14 MED ORDER — METHYLPREDNISOLONE SODIUM SUCC 40 MG IJ SOLR
40.0000 mg | Freq: Once | INTRAMUSCULAR | Status: AC
  Administered 2021-02-14: 40 mg via INTRAVENOUS
  Filled 2021-02-14: qty 1

## 2021-02-14 MED ORDER — CHLORHEXIDINE GLUCONATE CLOTH 2 % EX PADS
6.0000 | MEDICATED_PAD | Freq: Every day | CUTANEOUS | Status: DC
  Administered 2021-02-15 – 2021-02-20 (×6): 6 via TOPICAL

## 2021-02-14 MED ORDER — IPRATROPIUM-ALBUTEROL 0.5-2.5 (3) MG/3ML IN SOLN
3.0000 mL | Freq: Four times a day (QID) | RESPIRATORY_TRACT | Status: DC
  Administered 2021-02-14 – 2021-02-20 (×23): 3 mL via RESPIRATORY_TRACT
  Filled 2021-02-14 (×24): qty 3

## 2021-02-14 MED ORDER — BUDESONIDE 0.25 MG/2ML IN SUSP
0.2500 mg | Freq: Two times a day (BID) | RESPIRATORY_TRACT | Status: DC
  Administered 2021-02-14 – 2021-02-17 (×6): 0.25 mg via RESPIRATORY_TRACT
  Filled 2021-02-14 (×6): qty 2

## 2021-02-14 MED ORDER — HYDROCODONE BIT-HOMATROP MBR 5-1.5 MG/5ML PO SOLN
5.0000 mL | Freq: Four times a day (QID) | ORAL | Status: DC | PRN
  Administered 2021-02-14: 5 mL via ORAL
  Filled 2021-02-14: qty 5

## 2021-02-14 MED ORDER — GUAIFENESIN ER 600 MG PO TB12
600.0000 mg | ORAL_TABLET | Freq: Two times a day (BID) | ORAL | Status: DC
  Administered 2021-02-14 – 2021-02-20 (×4): 600 mg via ORAL
  Filled 2021-02-14 (×8): qty 1

## 2021-02-14 MED ORDER — METRONIDAZOLE 500 MG/100ML IV SOLN
500.0000 mg | Freq: Two times a day (BID) | INTRAVENOUS | Status: DC
  Administered 2021-02-15: 500 mg via INTRAVENOUS
  Filled 2021-02-14: qty 100

## 2021-02-14 MED ORDER — PROPOFOL 1000 MG/100ML IV EMUL
5.0000 ug/kg/min | INTRAVENOUS | Status: DC
  Administered 2021-02-15: 30 ug/kg/min via INTRAVENOUS
  Administered 2021-02-15: 25 ug/kg/min via INTRAVENOUS
  Administered 2021-02-16 (×3): 40 ug/kg/min via INTRAVENOUS
  Administered 2021-02-16 – 2021-02-17 (×2): 45 ug/kg/min via INTRAVENOUS
  Filled 2021-02-14 (×7): qty 100

## 2021-02-14 MED ORDER — METHYLPREDNISOLONE SODIUM SUCC 40 MG IJ SOLR
40.0000 mg | Freq: Two times a day (BID) | INTRAMUSCULAR | Status: DC
  Administered 2021-02-14: 40 mg via INTRAVENOUS
  Filled 2021-02-14: qty 1

## 2021-02-14 MED ORDER — PROPOFOL 1000 MG/100ML IV EMUL
INTRAVENOUS | Status: AC
  Administered 2021-02-14: 5 ug/kg/min via INTRAVENOUS
  Filled 2021-02-14: qty 100

## 2021-02-14 MED ORDER — IPRATROPIUM-ALBUTEROL 0.5-2.5 (3) MG/3ML IN SOLN: 3.0000 mL | Freq: Four times a day (QID) | RESPIRATORY_TRACT | Status: DC | PRN

## 2021-02-14 MED ORDER — ETOMIDATE 2 MG/ML IV SOLN
10.0000 mg | Freq: Once | INTRAVENOUS | Status: AC
  Administered 2021-02-14: 10 mg via INTRAVENOUS

## 2021-02-14 MED ORDER — ROCURONIUM BROMIDE 50 MG/5ML IV SOLN
50.0000 mg | Freq: Once | INTRAVENOUS | Status: AC
  Administered 2021-02-14: 50 mg via INTRAVENOUS

## 2021-02-14 MED ORDER — FENTANYL CITRATE PF 50 MCG/ML IJ SOSY
50.0000 ug | PREFILLED_SYRINGE | Freq: Once | INTRAMUSCULAR | Status: AC
  Administered 2021-02-14: 50 ug via INTRAVENOUS

## 2021-02-14 MED ORDER — MIDODRINE HCL 5 MG PO TABS
5.0000 mg | ORAL_TABLET | Freq: Three times a day (TID) | ORAL | Status: DC
  Administered 2021-02-14: 5 mg via ORAL
  Filled 2021-02-14 (×2): qty 1

## 2021-02-14 NOTE — Progress Notes (Signed)
Pt placed on HHFNC due to desat into 70's

## 2021-02-14 NOTE — ED Notes (Addendum)
Gust Brooms (caretaker) : [phone] Gwyndolyn Saxon Civil engineer, contracting of care home): [phone] Call with updates or questions

## 2021-02-14 NOTE — Progress Notes (Addendum)
Name: Helen Patton MRN: [id number] DOB: 07-24-1960     Subjective: Called to bedside by nursing who reports that patient's SPO2 was in the 62s.   Objective:   Blood pressure (!) 100/57, pulse 97, temperature 98.6 F (37 C), temperature source Oral, resp. rate 18, height 4' 11.02" (1.499 m), weight 49.9 kg, SpO2 (!) 85 %.   DG Chest 1 View  Result Date: 02/14/2021 CLINICAL DATA:  Hypoxia EXAM: CHEST  1 VIEW COMPARISON:  02/22/2021 FINDINGS: Single frontal view of the chest demonstrates a stable cardiac silhouette. There is marked progression of bilateral perihilar airspace disease, with likely development of small bilateral pleural effusions. No pneumothorax. No acute bony abnormalities. IMPRESSION: 1. Marked progression of bilateral airspace disease consistent with worsening infection or edema. 2. Small bilateral pleural effusions, increased since prior study. Electronically Signed   By: Randa Ngo M.D.   On: 02/14/2021 21:12     Results for orders placed or performed during the hospital encounter of 02/11/2021 (from the past 24 hour(s))  Blood gas, arterial     Status: Abnormal   Collection Time: 02/14/21  2:33 AM  Result Value Ref Range   FIO2 0.40    Delivery systems NASAL CANNULA    pH, Arterial 7.39 7.350 - 7.450   pCO2 arterial 41 32.0 - 48.0 mmHg   pO2, Arterial 42 (L) 83.0 - 108.0 mmHg   Bicarbonate 24.8 20.0 - 28.0 mmol/L   Acid-base deficit 0.2 0.0 - 2.0 mmol/L   O2 Saturation 76.9 %   Patient temperature 37.0    Collection site ARTERIAL DRAW    Sample type ARTERIAL DRAW    Allens test (pass/fail) PASS PASS  Basic metabolic panel     Status: Abnormal   Collection Time: 02/14/21  6:14 AM  Result Value Ref Range   Sodium 140 135 - 145 mmol/L   Potassium 3.8 3.5 - 5.1 mmol/L   Chloride 106 98 - 111 mmol/L   CO2 23 22 - 32 mmol/L   Glucose, Bld 99 70 - 99 mg/dL   BUN 20 8 - 23 mg/dL   Creatinine, Ser 0.63 0.44 - 1.00 mg/dL   Calcium 8.2 (L) 8.9 - 10.3 mg/dL    GFR, Estimated >60 >60 mL/min   Anion gap 11 5 - 15  Magnesium     Status: None   Collection Time: 02/14/21  6:14 AM  Result Value Ref Range   Magnesium 1.9 1.7 - 2.4 mg/dL  Phosphorus     Status: Abnormal   Collection Time: 02/14/21  6:14 AM  Result Value Ref Range   Phosphorus 2.1 (L) 2.5 - 4.6 mg/dL  CBC WITH DIFFERENTIAL     Status: Abnormal   Collection Time: 02/14/21  6:14 AM  Result Value Ref Range   WBC 13.7 (H) 4.0 - 10.5 K/uL   RBC 3.33 (L) 3.87 - 5.11 MIL/uL   Hemoglobin 10.0 (L) 12.0 - 15.0 g/dL   HCT 31.4 (L) 36.0 - 46.0 %   MCV 94.3 80.0 - 100.0 fL   MCH 30.0 26.0 - 34.0 pg   MCHC 31.8 30.0 - 36.0 g/dL   RDW 14.3 11.5 - 15.5 %   Platelets 299 150 - 400 K/uL   nRBC 0.0 0.0 - 0.2 %   Neutrophils Relative % 73 %   Neutro Abs 10.0 (H) 1.7 - 7.7 K/uL   Lymphocytes Relative 12 %   Lymphs Abs 1.7 0.7 - 4.0 K/uL   Monocytes Relative 14 %  Monocytes Absolute 1.8 (H) 0.1 - 1.0 K/uL   Eosinophils Relative 0 %   Eosinophils Absolute 0.0 0.0 - 0.5 K/uL   Basophils Relative 0 %   Basophils Absolute 0.0 0.0 - 0.1 K/uL   Immature Granulocytes 1 %   Abs Immature Granulocytes 0.07 0.00 - 0.07 K/uL  Protime-INR     Status: None   Collection Time: 02/14/21  6:14 AM  Result Value Ref Range   Prothrombin Time 13.5 11.4 - 15.2 seconds   INR 1.0 0.8 - 1.2  Cortisol-am, blood     Status: None   Collection Time: 02/14/21  6:14 AM  Result Value Ref Range   Cortisol - AM 22.6 6.7 - 22.6 ug/dL  Procalcitonin     Status: None   Collection Time: 02/14/21  6:14 AM  Result Value Ref Range   Procalcitonin <0.10 ng/mL  Blood gas, arterial     Status: Abnormal   Collection Time: 02/14/21  7:09 PM  Result Value Ref Range   FIO2 0.96    Delivery systems HI FLOW NASAL CANNULA    pH, Arterial 7.37 7.350 - 7.450   pCO2 arterial 43 32.0 - 48.0 mmHg   pO2, Arterial 56 (L) 83.0 - 108.0 mmHg   Bicarbonate 24.9 20.0 - 28.0 mmol/L   Acid-base deficit 0.5 0.0 - 2.0 mmol/L   O2 Saturation  87.8 %   Patient temperature 37.0    Collection site RIGHT RADIAL    Sample type ARTERIAL DRAW    Allens test (pass/fail) PASS PASS  Blood gas, arterial     Status: Abnormal   Collection Time: 02/14/21 10:16 PM  Result Value Ref Range   FIO2 1.00    Delivery systems BILEVEL POSITIVE AIRWAY PRESSURE    Inspiratory PAP 10    Expiratory PAP 5    pH, Arterial 7.33 (L) 7.350 - 7.450   pCO2 arterial 48 32.0 - 48.0 mmHg   pO2, Arterial 51 (L) 83.0 - 108.0 mmHg   Bicarbonate 25.3 20.0 - 28.0 mmol/L   Acid-base deficit 0.9 0.0 - 2.0 mmol/L   O2 Saturation 82.8 %   Patient temperature 37.0    Collection site RIGHT RADIAL    Sample type ARTERIAL DRAW    Allens test (pass/fail) PASS PASS     Physical Exam:   General: Adult female, acutely ill, lying in bed in no acute distress HEENT: anicteric, atraumatic, neck supple Neuro: A&O follows commands, PERRL  CV: s1s2, RRR, no r/m/g Pulm: Regular with increased work of breathing on HFNC at 45L , breath sounds clear-BUL & diminished-BLL GI: abdomen soft, non-distended, non-tender, bs acitve x 4 GU: deferred Skin: warm, dry, no rashes/lesions noted Extremities: warm/dry, pulses + 2 R/P, no edema noted   Assessment/Plan:   Hypoxia -On arrival to patient's bedside patient is satting 77% on high flow nasal cannula and does have increased work of breathing.  Respiratory rate is in the mid 30s.  Patient placed on nonrebreather breathing by myself with minimal improvement in oxygen saturation.  Respiratory therapy called to bedside to place patient on BiPAP.  Upon placement of BiPAP patient increased work of breathing improved and oxygen saturation slowly increased and is now 91%.  Upon chart review patient has been satting in the 80s for the majority of the day.  ABG drawn about 1 hour ago that showed hypoxia with a PAO2 of 56.  On deep chart review patient does have a previous diagnosis of chronic bronchitis listed from 2019. -Chest x-ray  ordered;  impression read as worsening infection or edema.  20 mg IV furosemide ordered.   - D-dimer ordered; will consider CT angio once resulted. -ABG at 2216 shows pH 7.33 PCO2 48 PO2 51 down from 56 at 1900.  This is on BiPAP 10/5 and 100% FiO2.  We will consult critical care.  Chronic Bronchitis -Solu-Medrol 40 mg ordered

## 2021-02-14 NOTE — ED Notes (Signed)
Bladder scan 0 mls urine.  Pt has pure wick in place.

## 2021-02-14 NOTE — ED Notes (Signed)
Oxygen sats in  the 70's and 80's  md aware.   Abg ordered.

## 2021-02-14 NOTE — ED Notes (Signed)
Md in with pt.   

## 2021-02-14 NOTE — ED Notes (Signed)
RT in with pt.

## 2021-02-14 NOTE — Progress Notes (Signed)
[facility] Progress Note  Patient: Helen Patton    ZOX:[id number]  DOA: 02/23/2021     Date of Service: the patient was seen and examined on 02/14/2021  Chief Complaint  Patient presents with   Fall   Brief hospital course: Helen Patton is a 61 y.o. female with medical history significant for learning and intellectual disability, hypertension, seizures, cognitive decline, history of left lower breast cancer ER positive, who presents emergency department for chief concerns of a fall and imbalance.  ED Course: Discussed with emergency medicine provider, patient requiring hospitalization for chief concerns of multifocal pneumonia with hypotension.  Vitals in the emergency department showed temperature of 97.8, respiration rate of 16, initial blood pressure 84/40 and improved to 97/52, patient had an episode of hypoxia with SPO2 of 85% to 89% on room air.  Patient was placed on 4 L nasal cannula with improvement to 95%.   Labs in the emergency department showed serum sodium of 135, potassium 4.7, chloride 102, bicarb 27, BUN of 24, serum creatinine of 0.88, nonfasting blood glucose 135, WBC elevated at 17.5, hemoglobin 10.1, platelets of 266.  Lactic acid was not elevated at 1.9.  COVID/influenza A/influenza B PCR were negative.   UA was positive for small leukocytes, and nitrates is positive.   In the emergency department patient received sodium chloride 1 L bolus, ceftriaxone 2 g IV, azithromycin 500 milligrams IV.     Assessment and Plan: Principal Problem:   Severe sepsis with acute organ dysfunction (HCC) Active Problems:   Intellectual disability   Breast cancer (HCC)   Anxiety   Arthralgia of hip   Cognitive decline   Clinical depression   Acid reflux   HLD (hyperlipidemia)   Methicillin resistant Staphylococcus aureus infection   Seizure (HCC)   S/P total hip arthroplasty   # Patient met sepsis criteria with septic shock # Multifocal pneumonia # Acute  hypoxic respiratory distress - Midodrine 10 mg p.o. once given, BP still low, started midodrine 5 mg p.o. 3 times daily with holding parameters - Currently on  15 L [place] - Increased respiration rate, leukocytosis, source of pneumonia versus urine - Continue azithromycin and ceftriaxone - Blood cultures x2 are in process - Follow-up with urine culture - Check MRSA PCR    # History of seizures - Seizure precaution - Resumed home lamotrigine 300 mg p.o. twice daily   # History of hypertension-patient is currently hypotensive therefore I have not resumed losartan 50 mg daily, amlodipine 10 mg daily Hypotension, started midodrine 5 mg p.o. 3 times daily with holding parameters We will continue to monitor BP and titrate medications accordingly  # Depression-duloxetine 90 mg daily resumed   # GERD-PPI   # Anxiety-lorazepam 1 mg every morning and 0.5 mg by mouth at noon and 0.5 mg by mouth at bedtime   # Left lower outer breast cancer ER positive - Per med reconciliation patient is no longer on Femara   Hypophosphatemia, phos repleted.   There is no height or weight on file to calculate BMI.    Interventions:    Diet: Heart healthy DVT Prophylaxis: Subcutaneous Lovenox   Advance goals of care discussion: Full code  Family Communication: family was NOT present at bedside, at the time of interview.  The pt provided permission to discuss medical plan with the family. Opportunity was given to ask question and all questions were answered satisfactorily.   Disposition:  Pt is from group home, admitted with respiratory failure and pneumonia,  still has respiratory failure, which precludes a safe discharge. Discharge to group home, when clinically stable, may require 3 to 5 days due to severity of illness.  Subjective: No significant events overnight, patient still has significant shortness of breath, she is nonverbal, unable to offer any complaints seems to be short of breath.  Patient  was eating breakfast in the morning with help.   Physical Exam: General:  alert not oriented to time, place, and person.  Appear in moderate distress, affect appropriate Eyes: PERRLA ENT: Oral Mucosa Clear, moist  Neck: no JVD,  Cardiovascular: S1 and S2 Present, no Murmur,  Respiratory: increased respiratory effort, Bilateral Air entry equal and Decreased, b/l Crackles, mild wheezes Abdomen: Bowel Sound present, Soft and no tenderness,  Skin: no rashes Extremities: no Pedal edema, no calf tenderness Neurologic: without any new focal findings Gait not checked due to patient safety concerns  Vitals:   02/14/21 1430 02/14/21 1445 02/14/21 1500 02/14/21 1515  BP:    (!) 90/49  Pulse: 89 82 81 79  Resp: (!) 21     Temp:      TempSrc:      SpO2: 92% 93% 90% 91%   No intake or output data in the 24 hours ending 02/14/21 1612 There were no vitals filed for this visit.  Data Reviewed: I have personally reviewed and interpreted daily labs, tele strips, imagings as discussed above. I reviewed all nursing notes, pharmacy notes, vitals, pertinent old records I have discussed plan of care as described above with RN and patient/family.  CBC: Recent Labs  Lab 02/11/2021 1216 02/14/21 0614  WBC 17.5* 13.7*  NEUTROABS  --  10.0*  HGB 10.1* 10.0*  HCT 32.4* 31.4*  MCV 95.9 94.3  PLT 266 673   Basic Metabolic Panel: Recent Labs  Lab 02/11/2021 1216 02/14/21 0614  NA 135 140  K 4.7 3.8  CL 102 106  CO2 27 23  GLUCOSE 135* 99  BUN 24* 20  CREATININE 0.88 0.63  CALCIUM 9.2 8.2*  MG  --  1.9  PHOS  --  2.1*    Studies: No results found.  Scheduled Meds:  budesonide (PULMICORT) nebulizer solution  0.25 mg Nebulization BID   DULoxetine  30 mg Oral Daily   DULoxetine  60 mg Oral Daily   enoxaparin (LOVENOX) injection  40 mg Subcutaneous Q24H   guaiFENesin  600 mg Oral BID   ipratropium-albuterol  3 mL Nebulization Q6H   lamoTRIgine  300 mg Oral BID   Continuous  Infusions:  azithromycin Stopped (02/14/21 1110)   cefTRIAXone (ROCEPHIN)  IV Stopped (02/14/21 1030)   potassium PHOSPHATE IVPB (in mmol) 15 mmol (02/14/21 1105)   PRN Meds: acetaminophen **OR** acetaminophen, HYDROcodone bit-homatropine, ipratropium-albuterol, LORazepam, ondansetron **OR** ondansetron (ZOFRAN) IV  Time spent: 35 minutes  Author: Val Riles. MD [facility] 02/14/2021 4:12 PM  To reach On-call, see care teams to locate the attending and reach out to them via [email or web address]. If 7PM-7AM, please contact night-coverage If you still have difficulty reaching the attending provider, please page the [facility] (Director on Call) for [facility] on amion for assistance.

## 2021-02-14 NOTE — Telephone Encounter (Signed)
Copied from [place] [phone]. Topic: General - Other >> Feb 14, 2021  4:23 PM Pawlus, Brayton Layman A wrote: Reason for CRM: Pts mother called in requesting information regarding the pts last visit, please advise.

## 2021-02-14 NOTE — ED Provider Notes (Addendum)
[facility] Department of Emergency Medicine   Code Blue CONSULT NOTE  Chief Complaint: Cardiac arrest/unresponsive   Level V Caveat: Unresponsive  History of present illness: I was contacted by the hospital for a CODE BLUE respiratory arrest upstairs and presented to the patient's bedside.   Patient was intially admitted to the hospitalist service for multifocal PNA.  Had increasing O2 requirement and placed on bipap.  ICU team made aware and patient decompensated on bipap with sats in the 80s.  Decision made to intubate by ICU team.  Given 100 mcg IV fentanyl, 20 mg IV etomidate and 50 mg IV Rocuronium prior to my arrival in ED.  Code blue called due to respiratory arrest and difficult intubation by ICU provider due to small, anterior airway.  ROS: Unable to obtain, Level V caveat  Scheduled Meds:  budesonide (PULMICORT) nebulizer solution  0.25 mg Nebulization BID   [START ON 02/15/2021] Chlorhexidine Gluconate Cloth  6 each Topical Daily   DULoxetine  30 mg Oral Daily   DULoxetine  60 mg Oral Daily   enoxaparin (LOVENOX) injection  40 mg Subcutaneous Q24H   guaiFENesin  600 mg Oral BID   ipratropium-albuterol  3 mL Nebulization Q6H   lamoTRIgine  300 mg Oral BID   methylPREDNISolone (SOLU-MEDROL) injection  40 mg Intravenous Q12H   midodrine  5 mg Oral TID WC   Continuous Infusions:  propofol     azithromycin Stopped (02/14/21 1110)   cefTRIAXone (ROCEPHIN)  IV Stopped (02/14/21 1030)   metronidazole     [START ON 02/15/2021] propofol (DIPRIVAN) infusion     PRN Meds:.acetaminophen **OR** acetaminophen, HYDROcodone bit-homatropine, ipratropium-albuterol, LORazepam, ondansetron **OR** ondansetron (ZOFRAN) IV Past Medical History:  Diagnosis Date   Anxiety    Arthritis    osteoarthritis   Breast cancer ([place]) 2015   [name]   Cancer ([place]) 11/2013   T1c, Nx; ER/ PR +; Her 2 neg invasive mammary carcinoma left breast. Wide excision only based  on Tumor Board review.    Cataract    Depression    Difficulty swallowing    Falls    GERD (gastroesophageal reflux disease)    Hidradenitis 2012   MRSA, on chronic topical suppression   History of hiatal hernia    Hyperlipidemia    Hypertension    Mental retardation    MRSA (methicillin resistant staph aureus) culture positive 2012   Osteoporosis    Pneumonia    aspiration pneumonia   Seizures ([place])    last seizure February 2017   Past Surgical History:  Procedure Laterality Date   BREAST LUMPECTOMY Left 12/07/2013   T1b, NX, ER/PR positive, HER-2/neu not overexpressed, 7 mm.  Not candidate for chemotherapy/sentinel node/radiation based on mental retardation.   BREAST SURGERY Left 12/07/2013   T1b, Nx; ER+, PR+, her 2 not amplified. 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 HIP  08/23/2015   Procedure: CONVERSION TO TOTAL HIP;  Surgeon: Dereck Leep, MD;  Location: [facility] ORS;  Service: Orthopedics;;   dislocated hip     HARDWARE REMOVAL  08/23/2015   Procedure: HARDWARE REMOVAL;  Surgeon: Dereck Leep, MD;  Location: [facility] ORS;  Service: Orthopedics;;   TOTAL HIP REVISION Right 08/23/2015   Procedure: TOTAL HIP REVISION;  Surgeon: Dereck Leep, MD;  Location: [facility] ORS;  Service: Orthopedics;  Laterality: Right;   Social History  Socioeconomic History   Marital status: Single    Spouse name: Not on file   Number of children: Not on file   Years of education: Not on file   Highest education level: Not on file  Occupational History   Not on file  Tobacco Use   Smoking status: Never   Smokeless tobacco: Never  Vaping Use   Vaping Use: Never used  Substance and Sexual Activity   Alcohol use: No   Drug use: No   Sexual activity: Never  Other Topics Concern   Not on file  Social History Narrative   Not on file   Social Determinants of Health   Financial Resource Strain: Not  on file  Food Insecurity: Not on file  Transportation Needs: Not on file  Physical Activity: Not on file  Stress: Not on file  Social Connections: Not on file  Intimate Partner Violence: Not on file   Allergies  Allergen Reactions   Sulfa Antibiotics Other (See Comments)    "Does not know"   Keflex [Cephalexin] Other (See Comments)    "Does not know" unknown    Last set of Vital Signs (not current) Vitals:   02/14/21 2013 02/14/21 2018  BP: (!) 100/57 (!) 100/57  Pulse: 97 97  Resp: 18 18  Temp: 98.6 F (37 C) 98.6 F (37 C)  SpO2:  (!) 85%      Physical Exam  Gen: unresponsive Cardiovascular: RRR Resp: apneic. Breath sounds equal bilaterally with bagging  Abd: nondistended  Neuro: GCS 3 after sedation and paralytic HEENT: Small amount of blood in posterior OP, no swelling noted, small airway with anterior positioning Neck: trachea midline Musculoskeletal: No deformity  Skin: warm  Procedures (when applicable, including Critical Care time): Procedure Name: Intubation Date/Time: 02/14/2021 11:55 PM Performed by: Jhordyn Hoopingarner, Delice Bison, DO Pre-anesthesia Checklist: Patient identified, Emergency Drugs available and Patient being monitored Oxygen Delivery Method: Ambu bag Preoxygenation: Pre-oxygenation with 100% oxygen Induction Type: Rapid sequence Laryngoscope Size: Glidescope and 3 Grade View: Grade II Tube size: 7.0 mm Number of attempts: 1 Placement Confirmation: ETT inserted through vocal cords under direct vision, Breath sounds checked- equal and bilateral and CO2 detector Secured at: 22 cm Tube secured with: ETT holder Difficulty Due To: Difficult Airway- due to anterior larynx    CRITICAL CARE Performed by: Pryor Curia   Total critical care time: 35 minutes  Critical care time was exclusive of separately billable procedures and treating other patients.  Critical care was necessary to treat or prevent imminent or life-threatening  deterioration.  Critical care was time spent personally by me on the following activities: development of treatment plan with patient and/or surrogate as well as nursing, discussions with consultants, evaluation of patient's response to treatment, examination of patient, obtaining history from patient or surrogate, ordering and performing treatments and interventions, ordering and review of laboratory studies, ordering and review of radiographic studies, pulse oximetry and re-evaluation of patient's condition.   MDM / Assessment and Plan Patient here with multifocal pneumonia with respiratory distress leading to respiratory arrest requiring intubation.  CODE BLUE was activated due to respiratory arrest and ICU provider having a difficult time intubating patient due to small airway and anterior positioning of her larynx.  She had already received RSI medications prior to my arrival.  No cardiac arrest.  I was able to intubate the patient after repositioning her and using a glide scope with a 3 blade and a 7.0 endotracheal tube.  No swelling noted to  the airway.  She did have a small amount of blood in the airway.  Positive color change on CO2 monitor.  Equal breath sounds bilaterally.  ICU provider to follow-up on chest x-ray.   I reviewed all nursing notes and pertinent previous records as available.  I have reviewed and interpreted any EKGs, lab and urine results, imaging (as available).    Aceton Kinnear, Delice Bison, DO 02/15/21 0009    Shantae Vantol, Delice Bison, DO 02/15/21 5732

## 2021-02-14 NOTE — ED Notes (Signed)
Pt awake   high flow oxygen in place  iv fluids infusing.

## 2021-02-15 ENCOUNTER — Encounter: Payer: Self-pay | Admitting: Internal Medicine

## 2021-02-15 ENCOUNTER — Inpatient Hospital Stay (HOSPITAL_COMMUNITY)
Admit: 2021-02-15 | Discharge: 2021-02-15 | Disposition: A | Discharge status: Home / Self Care | Payer: Medicare Other | Attending: Pulmonary Disease | Admitting: Pulmonary Disease

## 2021-02-15 ENCOUNTER — Inpatient Hospital Stay: Payer: Medicare Other

## 2021-02-15 DIAGNOSIS — I5021 Acute systolic (congestive) heart failure: Secondary | ICD-10-CM

## 2021-02-15 LAB — BASIC METABOLIC PANEL
Anion gap: 10 (ref 5–15)
BUN: 18 mg/dL (ref 8–23)
CO2: 23 mmol/L (ref 22–32)
Calcium: 7.1 mg/dL — ABNORMAL LOW (ref 8.9–10.3)
Chloride: 105 mmol/L (ref 98–111)
Creatinine, Ser: 0.82 mg/dL (ref 0.44–1.00)
GFR, Estimated: >60 mL/min (ref >60)
Glucose, Bld: 448 mg/dL — ABNORMAL HIGH (ref 70–99)
Potassium: 2.9 mmol/L — ABNORMAL LOW (ref 3.5–5.1)
Sodium: 138 mmol/L (ref 135–145)

## 2021-02-15 LAB — CBC
HCT: 27.1 % — ABNORMAL LOW (ref 36.0–46.0)
Hemoglobin: 8.7 g/dL — ABNORMAL LOW (ref 12.0–15.0)
MCH: 30.6 pg (ref 26.0–34.0)
MCHC: 32.1 g/dL (ref 30.0–36.0)
MCV: 95.4 fL (ref 80.0–100.0)
Platelets: 275 10*3/uL (ref 150–400)
RBC: 2.84 MIL/uL — ABNORMAL LOW (ref 3.87–5.11)
RDW: 14.1 % (ref 11.5–15.5)
WBC: 18.4 10*3/uL — ABNORMAL HIGH (ref 4.0–10.5)
nRBC: 0 % (ref 0.0–0.2)

## 2021-02-15 LAB — GLUCOSE, CAPILLARY
Glucose-Capillary: 100 mg/dL — ABNORMAL HIGH (ref 70–99)
Glucose-Capillary: 137 mg/dL — ABNORMAL HIGH (ref 70–99)
Glucose-Capillary: 138 mg/dL — ABNORMAL HIGH (ref 70–99)
Glucose-Capillary: 165 mg/dL — ABNORMAL HIGH (ref 70–99)
Glucose-Capillary: 235 mg/dL — ABNORMAL HIGH (ref 70–99)
Glucose-Capillary: 314 mg/dL — ABNORMAL HIGH (ref 70–99)
Glucose-Capillary: 314 mg/dL — ABNORMAL HIGH (ref 70–99)

## 2021-02-15 LAB — RESPIRATORY PANEL BY PCR

## 2021-02-15 LAB — HEMOGLOBIN A1C
Hgb A1c MFr Bld: 5.1 % (ref 4.8–5.6)
Mean Plasma Glucose: 99.67 mg/dL

## 2021-02-15 LAB — ECHOCARDIOGRAM COMPLETE
AR max vel: 1.62 cm2
AV Area VTI: 1.76 cm2
AV Area mean vel: 1.85 cm2
AV Mean grad: 12 mmHg
AV Peak grad: 21.7 mmHg
Ao pk vel: 2.33 m/s
Area-P 1/2: 3.11 cm2
Height: 59.016 in
MV VTI: 2.66 cm2
S' Lateral: 2.31 cm
Weight: 1760.15 oz

## 2021-02-15 LAB — BLOOD GAS, ARTERIAL
Acid-Base Excess: 0.4 mmol/L (ref 0.0–2.0)
Bicarbonate: 26 mmol/L (ref 20.0–28.0)
FIO2: 1
MECHVT: 400 mL
O2 Saturation: 93.3 %
PEEP: 10 cmH2O
Patient temperature: 37
RATE: 16 resp/min
pCO2 arterial: 45 mmHg (ref 32.0–48.0)
pH, Arterial: 7.37 (ref 7.350–7.450)
pO2, Arterial: 70 mmHg — ABNORMAL LOW (ref 83.0–108.0)

## 2021-02-15 LAB — PROTEIN, TOTAL: Total Protein: 5.8 g/dL — ABNORMAL LOW (ref 6.5–8.1)

## 2021-02-15 LAB — D-DIMER, QUANTITATIVE: D-Dimer, Quant: 2.25 ug/mL-FEU — ABNORMAL HIGH (ref 0.00–0.50)

## 2021-02-15 LAB — MAGNESIUM: Magnesium: 1.4 mg/dL — ABNORMAL LOW (ref 1.7–2.4)

## 2021-02-15 LAB — TRIGLYCERIDES: Triglycerides: 54 mg/dL (ref <150)

## 2021-02-15 LAB — LACTATE DEHYDROGENASE: LDH: 208 U/L — ABNORMAL HIGH (ref 98–192)

## 2021-02-15 LAB — BRAIN NATRIURETIC PEPTIDE: B Natriuretic Peptide: 963 pg/mL — ABNORMAL HIGH (ref 0.0–100.0)

## 2021-02-15 LAB — STREP PNEUMONIAE URINARY ANTIGEN: Strep Pneumo Urinary Antigen: NEGATIVE

## 2021-02-15 LAB — PHOSPHORUS: Phosphorus: 2 mg/dL — ABNORMAL LOW (ref 2.5–4.6)

## 2021-02-15 MED ORDER — POTASSIUM PHOSPHATES 15 MMOLE/5ML IV SOLN
30.0000 mmol | Freq: Once | INTRAVENOUS | Status: AC
  Administered 2021-02-15: 30 mmol via INTRAVENOUS
  Filled 2021-02-15: qty 10

## 2021-02-15 MED ORDER — ONDANSETRON HCL 4 MG/2ML IJ SOLN: 4.0000 mg | Freq: Four times a day (QID) | INTRAMUSCULAR | Status: DC | PRN

## 2021-02-15 MED ORDER — VITAL AF 1.2 CAL PO LIQD
1000.0000 mL | ORAL | Status: DC
  Administered 2021-02-15 – 2021-02-16 (×2): 1000 mL

## 2021-02-15 MED ORDER — IOHEXOL 350 MG/ML SOLN
75.0000 mL | Freq: Once | INTRAVENOUS | Status: AC | PRN
  Administered 2021-02-15: 75 mL via INTRAVENOUS

## 2021-02-15 MED ORDER — LAMOTRIGINE 100 MG PO TABS
300.0000 mg | ORAL_TABLET | Freq: Two times a day (BID) | ORAL | Status: DC
  Administered 2021-02-15 – 2021-02-20 (×8): 300 mg
  Filled 2021-02-15 (×12): qty 3

## 2021-02-15 MED ORDER — SODIUM CHLORIDE 0.9 % IV SOLN
2.0000 g | Freq: Two times a day (BID) | INTRAVENOUS | Status: DC
  Administered 2021-02-15 (×2): 2 g via INTRAVENOUS
  Filled 2021-02-15 (×3): qty 2

## 2021-02-15 MED ORDER — INSULIN ASPART 100 UNIT/ML IJ SOLN
0.0000 [IU] | Freq: Three times a day (TID) | INTRAMUSCULAR | Status: DC
  Administered 2021-02-15: 11 [IU] via SUBCUTANEOUS
  Filled 2021-02-15: qty 1

## 2021-02-15 MED ORDER — INSULIN ASPART 100 UNIT/ML IJ SOLN
0.0000 [IU] | INTRAMUSCULAR | Status: DC
  Administered 2021-02-15 (×2): 3 [IU] via SUBCUTANEOUS
  Administered 2021-02-15: 7 [IU] via SUBCUTANEOUS
  Administered 2021-02-16: 5 [IU] via SUBCUTANEOUS
  Administered 2021-02-16: 4 [IU] via SUBCUTANEOUS
  Administered 2021-02-16 (×3): 3 [IU] via SUBCUTANEOUS
  Administered 2021-02-17: 4 [IU] via SUBCUTANEOUS
  Administered 2021-02-17 – 2021-02-18 (×3): 3 [IU] via SUBCUTANEOUS
  Administered 2021-02-18: 7 [IU] via SUBCUTANEOUS
  Administered 2021-02-18 (×2): 3 [IU] via SUBCUTANEOUS
  Administered 2021-02-19: 4 [IU] via SUBCUTANEOUS
  Administered 2021-02-19: 7 [IU] via SUBCUTANEOUS
  Administered 2021-02-19 – 2021-02-20 (×4): 4 [IU] via SUBCUTANEOUS
  Administered 2021-02-20: 7 [IU] via SUBCUTANEOUS
  Filled 2021-02-15 (×21): qty 1

## 2021-02-15 MED ORDER — CHLORHEXIDINE GLUCONATE 0.12% ORAL RINSE (MEDLINE KIT)
15.0000 mL | Freq: Two times a day (BID) | OROMUCOSAL | Status: DC
  Administered 2021-02-15 – 2021-02-20 (×11): 15 mL via OROMUCOSAL

## 2021-02-15 MED ORDER — SODIUM CHLORIDE 0.9 % IV SOLN
250.0000 mL | INTRAVENOUS | Status: DC
  Administered 2021-02-15 – 2021-02-17 (×2): 250 mL via INTRAVENOUS

## 2021-02-15 MED ORDER — ACETAMINOPHEN 160 MG/5ML PO SOLN
650.0000 mg | Freq: Four times a day (QID) | ORAL | Status: DC | PRN
  Filled 2021-02-15: qty 20.3

## 2021-02-15 MED ORDER — PROSOURCE TF PO LIQD
45.0000 mL | Freq: Every day | ORAL | Status: DC
  Administered 2021-02-16: 45 mL
  Filled 2021-02-15: qty 45

## 2021-02-15 MED ORDER — MIDODRINE HCL 5 MG PO TABS
5.0000 mg | ORAL_TABLET | Freq: Three times a day (TID) | ORAL | Status: DC
  Administered 2021-02-15: 5 mg

## 2021-02-15 MED ORDER — DOCUSATE SODIUM 50 MG/5ML PO LIQD
100.0000 mg | Freq: Two times a day (BID) | ORAL | Status: DC
  Administered 2021-02-15 – 2021-02-16 (×4): 100 mg
  Filled 2021-02-15 (×5): qty 10

## 2021-02-15 MED ORDER — PANTOPRAZOLE SODIUM 40 MG IV SOLR
40.0000 mg | INTRAVENOUS | Status: DC
  Administered 2021-02-15 – 2021-02-20 (×6): 40 mg via INTRAVENOUS
  Filled 2021-02-15 (×6): qty 40

## 2021-02-15 MED ORDER — NOREPINEPHRINE 4 MG/250ML-% IV SOLN
2.0000 ug/min | INTRAVENOUS | Status: DC
  Administered 2021-02-15: 2 ug/min via INTRAVENOUS
  Administered 2021-02-15: 4 ug/min via INTRAVENOUS
  Administered 2021-02-16: 5 ug/min via INTRAVENOUS
  Administered 2021-02-17: 3 ug/min via INTRAVENOUS
  Filled 2021-02-15 (×3): qty 250

## 2021-02-15 MED ORDER — ONDANSETRON HCL 4 MG PO TABS: 4.0000 mg | ORAL_TABLET | Freq: Four times a day (QID) | ORAL | Status: DC | PRN

## 2021-02-15 MED ORDER — VANCOMYCIN HCL 750 MG/150ML IV SOLN: 750.0000 mg | INTRAVENOUS | Status: DC

## 2021-02-15 MED ORDER — MIDODRINE HCL 5 MG PO TABS
10.0000 mg | ORAL_TABLET | Freq: Three times a day (TID) | ORAL | Status: DC
  Administered 2021-02-15 – 2021-02-17 (×6): 10 mg
  Filled 2021-02-15 (×6): qty 2

## 2021-02-15 MED ORDER — MAGNESIUM SULFATE 2 GM/50ML IV SOLN
2.0000 g | Freq: Once | INTRAVENOUS | Status: AC
  Administered 2021-02-15: 2 g via INTRAVENOUS
  Filled 2021-02-15: qty 50

## 2021-02-15 MED ORDER — POLYETHYLENE GLYCOL 3350 17 G PO PACK
17.0000 g | PACK | Freq: Every day | ORAL | Status: DC
  Administered 2021-02-15 – 2021-02-16 (×2): 17 g
  Filled 2021-02-15 (×3): qty 1

## 2021-02-15 MED ORDER — FENTANYL 2500MCG IN NS 250ML (10MCG/ML) PREMIX INFUSION
50.0000 ug/h | INTRAVENOUS | Status: DC
  Administered 2021-02-15: 50 ug/h via INTRAVENOUS
  Administered 2021-02-16: 125 ug/h via INTRAVENOUS
  Administered 2021-02-17: 200 ug/h via INTRAVENOUS
  Filled 2021-02-15 (×3): qty 250

## 2021-02-15 MED ORDER — ADULT MULTIVITAMIN LIQUID CH
15.0000 mL | Freq: Every day | ORAL | Status: DC
  Administered 2021-02-16 – 2021-02-20 (×3): 15 mL
  Filled 2021-02-15 (×5): qty 15

## 2021-02-15 MED ORDER — POTASSIUM CHLORIDE 10 MEQ/100ML IV SOLN
10.0000 meq | INTRAVENOUS | Status: AC
  Administered 2021-02-15 (×2): 10 meq via INTRAVENOUS
  Filled 2021-02-15 (×2): qty 100

## 2021-02-15 MED ORDER — FREE WATER
30.0000 mL | Status: DC
  Administered 2021-02-15 – 2021-02-17 (×12): 30 mL

## 2021-02-15 MED ORDER — HYDROCODONE BIT-HOMATROP MBR 5-1.5 MG/5ML PO SOLN: 5.0000 mL | Freq: Four times a day (QID) | ORAL | Status: DC | PRN

## 2021-02-15 MED ORDER — ACETAMINOPHEN 650 MG RE SUPP: 650.0000 mg | Freq: Four times a day (QID) | RECTAL | Status: DC | PRN

## 2021-02-15 MED ORDER — LORAZEPAM 0.5 MG PO TABS: 0.5000 mg | ORAL_TABLET | Freq: Three times a day (TID) | ORAL | Status: DC | PRN

## 2021-02-15 MED ORDER — ORAL CARE MOUTH RINSE
15.0000 mL | OROMUCOSAL | Status: DC
  Administered 2021-02-15 – 2021-02-21 (×44): 15 mL via OROMUCOSAL

## 2021-02-15 MED ORDER — VANCOMYCIN HCL IN DEXTROSE 1-5 GM/200ML-% IV SOLN
1000.0000 mg | Freq: Once | INTRAVENOUS | Status: AC
  Administered 2021-02-15: 1000 mg via INTRAVENOUS
  Filled 2021-02-15: qty 200

## 2021-02-15 NOTE — Progress Notes (Signed)
Initial Nutrition Assessment  DOCUMENTATION CODES:   Not applicable  INTERVENTION:   Vital 1.2@ 63ml/hr continuous + ProSource TF 25ml daily via tube   Free water flushes 65ml q4 hours to maintain tube patency   Regimen provides 1192kcal/day, 83g/day protein and 934ml/day free water   Liquid MVI daily via tube  NUTRITION DIAGNOSIS:   Inadequate oral intake related to inability to eat (pt sedated and ventilated) as evidenced by NPO status.  GOAL:   Provide needs based on ASPEN/SCCM guidelines  MONITOR:   Vent status, Labs, Weight trends, TF tolerance, Skin, I & O's  REASON FOR ASSESSMENT:   Ventilator    ASSESSMENT:   61 y/o female with h/o metal retardation, breast cancer s/p lumpectomy, cerebral palsy, depression, anxiety, HLD, HTN, seizures, hiatal hernia and non verbal at baseline who is admitted with possible PNA, UTI, decompensated heart failure and sepsis.  Pt sedated and ventilated. OGT in place. Plan is to start tube feeds today. Pt's mother at bedside reports pt with good oral intake at baseline, mother reports "she loves food". Mother reports that pt is edentulous but that she is still able to eat regular foods; mother reports that patient ate a hamburger in the hospital prior to intubation. Per chart, pt is down 10lbs(8%) over the past year; this is not significant. Pt remains on a small amount of pressors. Plan today is for thoracentesis and echo.   Medications reviewed and include: colace, lovenox, insulin, protonix, miralax, Mg sulfate, levophed, Kphos, propofol   Labs reviewed: K 2.9(L), P 2.0(L), Mg 1.4(L) BNP- 963(H) Wbc- 18.4(H), Hgb 8.7(L), Hct 27.1(L) Cbgs- 314, 314 x 24 hrs  Patient is currently intubated on ventilator support MV: 7.4 L/min Temp (24hrs), Avg:98.4 F (36.9 C), Min:97.9 F (36.6 C), Max:98.9 F (37.2 C)  Propofol: 10.48 ml/hr- provides 276kcal/day   MAP- >41mmHg   UOP- 712ml  NUTRITION - FOCUSED PHYSICAL EXAM:  Flowsheet  Row Most Recent Value  Orbital Region No depletion  Upper Arm Region Moderate depletion  Thoracic and Lumbar Region No depletion  Buccal Region No depletion  Temple Region No depletion  Clavicle Bone Region No depletion  Clavicle and Acromion Bone Region No depletion  Scapular Bone Region No depletion  Dorsal Hand No depletion  Patellar Region Moderate depletion  Anterior Thigh Region Moderate depletion  Posterior Calf Region Moderate depletion  Edema (RD Assessment) None  Hair Reviewed  Eyes Reviewed  Mouth Reviewed  Skin Reviewed  Nails Reviewed   Diet Order:   Diet Order             Diet NPO time specified  Diet effective now                  EDUCATION NEEDS:   No education needs have been identified at this time  Skin:  Skin Assessment: Reviewed RN Assessment  Last BM:  pta  Height:   Ht Readings from Last 1 Encounters:  02/14/21 4' 11.02" (1.499 m)    Weight:   Wt Readings from Last 1 Encounters:  02/14/21 49.9 kg    Ideal Body Weight:  44.5 kg  BMI:  Body mass index is 22.21 kg/m.  Estimated Nutritional Needs:   Kcal:  1160kcal/day  Protein:  75-85g/day  Fluid:  1.4-1.6L/day  Koleen Distance MS, RD, LDN Please refer to [facility] for RD and/or RD on-call/weekend/after hours pager

## 2021-02-15 NOTE — Progress Notes (Signed)
Pt transported to ICU on the Bipap without incident. Pt remains on the Bipap. Report given to ICU RT.

## 2021-02-15 NOTE — Consult Note (Signed)
Pharmacy Antibiotic Note  Helen Patton is a 61 y.o. female admitted on 03/10/2021 with sepsis/PNA. Patient initially started on ceftriazone/azithromycin for CAP. Patient status worsening. Pharmacy has been consulted for Vancomycin dosing. Patient also started on cefepime  Plan: Vancomycin 1000 mg LD x 1  Initiate Vancomycin 750 mg Q24H. Goal AUC 400-550 Estimated AUC 457/Cmin: 11 Scr 0.8, IBW, Vd 0.72 MRSA PCR negative, provider awaiting other cultures to finalize    Height: 4' 11.02" (149.9 cm) Weight: 49.9 kg (110 lb 0.2 oz) IBW/kg (Calculated) : 43.24  Temp (24hrs), Avg:98.2 F (36.8 C), Min:97.8 F (36.6 C), Max:98.6 F (37 C)  Recent Labs  Lab 03/05/2021 1216 03/02/2021 1704 02/14/21 0614  WBC 17.5*  --  13.7*  CREATININE 0.88  --  0.63  LATICACIDVEN  --  1.9  --     Estimated Creatinine Clearance: 49.7 mL/min (by C-G formula based on SCr of 0.63 mg/dL).    Allergies  Allergen Reactions   Sulfa Antibiotics Other (See Comments)    "Does not know"   Keflex [Cephalexin] Other (See Comments)    "Does not know" unknown    Antimicrobials this admission: 02/12/2021 ceftriaxone >> 1/4 1/3 azithromycin >>  1/5 Vancomycin >> 1/5 Cefepime >> 1/5 Metronidazole >>  Dose adjustments this admission:   Microbiology results: 1/3 BCx: NGTD 1/3 UCx: sent  1/5 Sputum: sent  1/3 MRSA PCR: neg  Thank you for allowing pharmacy to be a part of this patients care.  Dorothe Pea, PharmD, BCPS Clinical Pharmacist   02/15/2021 3:10 AM

## 2021-02-15 NOTE — Progress Notes (Signed)
CRITICAL VALUE STICKER  CRITICAL VALUE: K+ 2.9, Mag 1.4, Phos 2.0  RECEIVER (on-site recipient of call): Julieanne Cotton, RN   DATE & TIME NOTIFIED: 02/15/21 @ [place] (representative from lab):  MD NOTIFIED: Kara Dies  TIME OF NOTIFICATION: 8350  RESPONSE:  MD made aware - awaiting orders

## 2021-02-15 NOTE — Progress Notes (Signed)
An USGPIV (ultrasound guided PIV) has been placed for short-term vasopressor infusion. A correctly placed ivWatch must be used when administering Vasopressors. Should this treatment be needed beyond 72 hours, central line access should be obtained.  It will be the responsibility of the bedside nurse to follow best practice to prevent extravasations.   ?

## 2021-02-15 NOTE — Telephone Encounter (Signed)
Attempted to reach patient and phone was d/c if patients mother returns call please triage for further inquiry. KW

## 2021-02-15 NOTE — Progress Notes (Signed)
Pt was evaluated at bedside for diagnostic thoracentesis. Upon US examination, there was trace fluid on L not large enough to safely access. No fluid noted on R.  Exam was ended and explained to patient's mother.   Patient's mother was in agreement with findings. Ordering provider notified.   Narda Rutherford, AGNP-BC 02/15/2021, 12:03 PM

## 2021-02-15 NOTE — Consult Note (Signed)
PHARMACY CONSULT NOTE - FOLLOW UP  Pharmacy Consult for Electrolyte Monitoring and Replacement   Recent Labs: Potassium (mmol/L)  Date Value  02/15/2021 2.9 (L)  01/08/2013 4.0   Magnesium (mg/dL)  Date Value  02/15/2021 1.4 (L)   Calcium (mg/dL)  Date Value  02/15/2021 7.1 (L)   Calcium, Total (mg/dL)  Date Value  01/08/2013 9.6   Albumin (g/dL)  Date Value  04/11/2020 4.3  01/08/2013 4.1   Phosphorus (mg/dL)  Date Value  02/15/2021 2.0 (L)   Sodium (mmol/L)  Date Value  02/15/2021 138  04/11/2020 141  01/08/2013 136     Assessment: 61yo female w/ h/o learning and intellectual disability, HTN, seizures, cognitive decline, history of left lower breast cancer ER positive, who presented to CC of fall and imbalance. Over next 24hrs progressive acute hypoxic respiratory failure req'ing intubation and admission to ICU. Pharmacy has been consulted for the mgmt of electrolytes.  Goal of Therapy:  Lytes WNL  Plan:  K: 3.8>2.9. (Despite receiving ~68meq from 76mmol Kphos on 1/4) Will receive ~36meq from Kphos today Will give additional run of IV KCL 57meq q1h x2 doses. Mg: 1.9>1.4.  Will replete with MgSO4 IV 2g x1 Phos: 2.1>2.0. (Despite 15 mmol of IV Kphos on 1/4) Will replete with IV Kphos 74mmol (provides ~71meq K+)  Lorna Dibble, PharmD, [facility] Clinical Pharmacist 02/15/2021 11:16 AM

## 2021-02-15 NOTE — Progress Notes (Signed)
*  PRELIMINARY RESULTS* Echocardiogram 2D Echocardiogram has been performed.  Helen Patton 02/15/2021, 1:39 PM

## 2021-02-15 NOTE — Progress Notes (Signed)
°  Chaplain On-Call responded to Code Blue notification at 2333 hours.  Chaplain arrived on the Unit and received medical update from Respiratory Therapy staff member, who stated that the Code Blue was called because the patient required intubation.  Patient's condition has been stabilized.  No family was present.  Chaplain Pollyann Samples M.Div., [facility]

## 2021-02-15 NOTE — Consult Note (Signed)
Pharmacy Antibiotic Note  Helen Patton is a 61 y.o. female admitted on 02/14/2021 with sepsis/PNA. Patient initially started on ceftriazone/azithromycin for CAP. Patient status worsening. Pharmacy has been consulted for Vancomycin dosing. Patient also started on cefepime  Plan: MRSA PCR negative, provider awaiting other cultures to finalize    Vancomycin 1000 mg LD x 1  Initiate Vancomycin 750 mg Q24H. Goal AUC 400-550 Estimated AUC 457/Cmin: 11 Scr 0.8, IBW, Vd 0.72  Also receiving Cefepime, metronidazole, and azithromycin.  Height: 4' 11.02" (149.9 cm) Weight: 49.9 kg (110 lb 0.2 oz) IBW/kg (Calculated) : 43.24  Temp (24hrs), Avg:98.4 F (36.9 C), Min:97.9 F (36.6 C), Max:98.9 F (37.2 C)  Recent Labs  Lab 03/03/2021 1216 03/07/2021 1704 02/14/21 0614 02/15/21 0457  WBC 17.5*  --  13.7* 18.4*  CREATININE 0.88  --  0.63 0.82  LATICACIDVEN  --  1.9  --   --      Estimated Creatinine Clearance: 48.5 mL/min (by C-G formula based on SCr of 0.82 mg/dL).    Allergies  Allergen Reactions   Sulfa Antibiotics Other (See Comments)    "Does not know"   Keflex [Cephalexin] Other (See Comments)    "Does not know" unknown    Antimicrobials this admission: Ongoing: azithromycin (1/3-1/9) Vancomycin (1/5 >> Cefepime (1/5>> Metronidazole (1/5>>  Completed: ceftriaxone (1/3 >> 1/4)  Dose adjustments this admission: CTM and adjust PRN. [Scr 0.88>0.63>0.82]  Microbiology results: 1/3 BCx: NGTD 1/3 UCx: >100k CFU Ecoli (pending susceptibilities)  1/5 Sputum (trach asp): sent  1/3 MRSA PCR: neg 1/3: Flu/Cov: negative  Thank you for allowing pharmacy to be a part of this patients care.  Lorna Dibble, PharmD, [facility] Clinical Pharmacist 02/15/2021 8:05 AM

## 2021-02-15 NOTE — Consult Note (Signed)
NAME:  Helen Patton, MRN:  [id number], DOB:  [birth year], LOS: 2 ADMISSION DATE:  02/28/2021, CONSULTATION DATE:  02/14/2021 REFERRING MD:  Sharion Settler, NP  CHIEF COMPLAINT: SOB   HPI  61 y.o  female with significant PMH of learning and intellectual disability, hypertension, seizures, cognitive decline, history of left lower breast cancer ER positive, who presents emergency department for chief concerns of a fall and imbalance.  ED Course: On arrival to the ED, she was afebrile with heart rate 80 beats/minute, the blood pressure  84/40 mm Hg, the respiratory rate 16 breaths/minute, and the oxygen saturation 95%  on 4L.  An electrocardiogram showed rhythm, and nonspecific ST-T-wave changes. Pertinent Labs /Diagnostics Findings: serum sodium of 135, potassium 4.7, chloride 102, bicarb 27, BUN of 24, serum creatinine of 0.88, nonfasting blood glucose 135, WBC elevated at 17.5, hemoglobin 10.1, platelets of 266.  Lactic acid was not elevated at 1.9.  COVID/influenza A/influenza B PCR were negative. UA was positive for small leukocytes, and nitrates is positive.. Chest xray concerning for multifocal pneumonia.  Patient received 1L bolus NS and was treated with ceftriaxone and Azithromycin. She was admitted to hospital service.  Hospital Course. While holding in the ED pending admission to stepdown, patient was noted with worsening hypoxia with increased oxygen requirement. She desaturated to mid 70s and was placed on [facility] M with minimal improvement. She eventually required BiPAP however was noted to have increased work of breathing with RR in the mid 30's. ABG was obtained and showed PAO2 of 56. Chest x-ray ordered; impression read as worsening infection or edema.  20 mg IV furosemide administered and PCCM consulted due to high risk for intubation. Patient was transferred to the ICU and intubated upon arrival.  Past Medical History   Anxiety, Arthritis, Breast cancer ([place]) (2015), Cancer ([place])  (11/2013), Cataract, Depression, Difficulty swallowing, Falls, GERD (gastroesophageal reflux disease), Hidradenitis (2012), History of hiatal hernia, Hyperlipidemia, Hypertension, Mental retardation, MRSA (methicillin resistant staph aureus) culture positive (2012), Osteoporosis, Pneumonia, and Seizures ([place]).  Significant Hospital Events   1/3: Admitted to hospitalist service with acute hypoxic respiratory failure 1/4: Transferred to the ICU for worsening respiratory status requiring intubation.  PCCM consulted  Consults:  PCCM  Procedures:  1/4: Nicole intubation  Significant Diagnostic Tests:  1/4: Chest Xray>Marked progression of bilateral airspace disease consistent with worsening infection or edema. 2. Small bilateral pleural effusions, increased since prior study.  Micro Data:  1/4: SARS-CoV-2 PCR> negative 1/4: Influenza PCR> negative 1/4: Blood culture x2> 1/4: Urine Culture> 1/4: MRSA PCR>>  1/4: Strep pneumo urinary antigen> 1/4: Legionella urinary antigen>  Antimicrobials:   Azithromycin 1/4> Ceftriaxone 1/4>x1 Metronidazole 1/4>x1 Vancomycin 1/5> Cefepime 1/5>  OBJECTIVE  Blood pressure 134/68, pulse 94, temperature 98.6 F (37 C), temperature source Oral, resp. rate (!) 9, height 4' 11.02" (1.499 m), weight 49.9 kg, SpO2 97 %.    Vent Mode: PRVC FiO2 (%):  [97 %-100 %] 100 % Set Rate:  [16 bmp] 16 bmp Vt Set:  [400 mL] 400 mL PEEP:  [10 cmH20] 10 cmH20 Plateau Pressure:  [21 cmH20] 21 cmH20  No intake or output data in the 24 hours ending 02/15/21 0047 Filed Weights   02/14/21 2019  Weight: 49.9 kg   Physical Examination  GENERAL: 61 year-old critically ill patient lying in the bed intubated and sedated EYES: Pupils equal, round, reactive to light and accommodation. No scleral icterus. Extraocular muscles intact.  HEENT: Head atraumatic, normocephalic. Oropharynx and nasopharynx clear.  NECK:  Supple, no jugular venous distention. No thyroid  enlargement, no tenderness.  LUNGS: Decreased breath sounds bilaterally, no wheezing, Moderate rales and rhonchi. No use of accessory muscles of respiration.  CARDIOVASCULAR: S1, S2 normal. No murmurs, rubs, or gallops.  ABDOMEN: Soft, nontender, nondistended. Bowel sounds present. No organomegaly or mass.  EXTREMITIES: No pedal edema, cyanosis, or clubbing.  NEUROLOGIC: Cranial nerves II through XII are intact.  Muscle strength not checked. Sensation intact. Gait not checked.  PSYCHIATRIC: The patient is intubated and sedated SKIN: No obvious rash, lesion, or ulcer.   Labs/imaging that I havepersonally reviewed  (right click and "Reselect all SmartList Selections" daily)     Labs   CBC: Recent Labs  Lab 03/06/2021 1216 02/14/21 0614  WBC 17.5* 13.7*  NEUTROABS  --  10.0*  HGB 10.1* 10.0*  HCT 32.4* 31.4*  MCV 95.9 94.3  PLT 266 258    Basic Metabolic Panel: Recent Labs  Lab 02/11/2021 1216 02/14/21 0614  NA 135 140  K 4.7 3.8  CL 102 106  CO2 27 23  GLUCOSE 135* 99  BUN 24* 20  CREATININE 0.88 0.63  CALCIUM 9.2 8.2*  MG  --  1.9  PHOS  --  2.1*   GFR: Estimated Creatinine Clearance: 49.7 mL/min (by C-G formula based on SCr of 0.63 mg/dL). Recent Labs  Lab 03/04/2021 1216 02/18/2021 1704 02/15/2021 2245 02/14/21 0614  PROCALCITON  --   --  0.42 <0.10  WBC 17.5*  --   --  13.7*  LATICACIDVEN  --  1.9  --   --     Liver Function Tests: No results for input(s): AST, ALT, ALKPHOS, BILITOT, PROT, ALBUMIN in the last 168 hours. No results for input(s): LIPASE, AMYLASE in the last 168 hours. No results for input(s): AMMONIA in the last 168 hours.  ABG    Component Value Date/Time   PHART 7.33 (L) 02/14/2021 2216   PCO2ART 48 02/14/2021 2216   PO2ART 51 (L) 02/14/2021 2216   HCO3 25.3 02/14/2021 2216   ACIDBASEDEF 0.9 02/14/2021 2216   O2SAT 82.8 02/14/2021 2216     Coagulation Profile: Recent Labs  Lab 02/14/21 0614  INR 1.0    Cardiac Enzymes: No  results for input(s): CKTOTAL, CKMB, CKMBINDEX, TROPONINI in the last 168 hours.  HbA1C: No results found for: HGBA1C  CBG: No results for input(s): GLUCAP in the last 168 hours.  Review of Systems:   UNABLE TO OBTAIN PATIENT IS INTUBATED  Past Medical History  She,  has a past medical history of Anxiety, Arthritis, Breast cancer ([place]) (2015), Cancer ([place]) (11/2013), Cataract, Depression, Difficulty swallowing, Falls, GERD (gastroesophageal reflux disease), Hidradenitis (2012), History of hiatal hernia, Hyperlipidemia, Hypertension, Mental retardation, MRSA (methicillin resistant staph aureus) culture positive (2012), Osteoporosis, Pneumonia, and Seizures ([place]).   Surgical History    Past Surgical History:  Procedure Laterality Date   BREAST LUMPECTOMY Left 12/07/2013   T1b, NX, ER/PR positive, HER-2/neu not overexpressed, 7 mm.  Not candidate for chemotherapy/sentinel node/radiation based on mental retardation.   BREAST SURGERY Left 12/07/2013   T1b, Nx; ER+, PR+, her 2 not amplified. 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 She has never used smokeless tobacco. She reports that she does not drink alcohol and does not use drugs.   Family History   Her family history includes Arthritis in her mother; Autoimmune disease in her brother; Breast cancer in her maternal aunt; Cancer in her paternal  grandmother; Cancer (age of onset: 96) in her maternal aunt; Diabetes in her father and mother; Hypertension in her maternal grandmother; Neuropathy in her mother; Parkinson's disease in her father.   Allergies Allergies  Allergen Reactions   Sulfa Antibiotics Other (See Comments)    "Does not know"   Keflex [Cephalexin] Other (See Comments)    "Does not know" unknown     Home Medications  Prior to Admission medications   Medication Sig Start Date End Date Taking? Authorizing Provider  alendronate (FOSAMAX) 70 MG tablet TAKE 1 TABLET  BY MOUTH WEEKLY EARLY MORNING BEFORE FOOD/MEDS WITH WATER. DO NOT LIE DOWN FOR 30 MINUTES 09/03/18  Yes Jerrol Banana., MD  amLODipine (NORVASC) 10 MG tablet TAKE 1 TABLET BY MOUTH AT BEDTIME 09/03/18  Yes Jerrol Banana., MD  BENZOYL PEROXIDE 5 % external wash USE TOPICALLY ONCE DAILY 10/27/19  Yes Jerrol Banana., MD  calcium-vitamin D (OSCAL WITH D) 500-200 MG-UNIT TABS tablet TAKE 1 TABLET BY MOUTH EVERY DAY WITH BREAKFAST 11/03/18  Yes Jerrol Banana., MD  cetirizine (ZYRTEC) 10 MG tablet Take 1 tablet (10 mg total) by mouth daily. 12/23/18  Yes Jerrol Banana., MD  chlorhexidine (PERIDEX) 0.12 % solution Use as directed 15 mLs in the mouth or throat 2 (two) times daily.   Yes [provider]  cyanocobalamin (,VITAMIN B-12,) 1000 MCG/ML injection INJECT 1ML=1000MCG INTRAMUSCULARLY EVERY MONTH (B-12 SUPPLEMENT) 10/02/20  Yes Jerrol Banana., MD  docusate sodium (COLACE) 100 MG capsule TAKE 1 CAPSULE BY MOUTH 2 TIMES PER DAY 09/03/18  Yes Jerrol Banana., MD  DULoxetine (CYMBALTA) 30 MG capsule Take 30 mg by mouth daily.   Yes [provider]  DULoxetine (CYMBALTA) 60 MG capsule Take 1 capsule (60 mg total) by mouth daily. 04/10/18  Yes Jerrol Banana., MD  ipratropium (ATROVENT) 0.03 % nasal spray Place 2 sprays into both nostrils 3 (three) times daily. 02/24/18  Yes Bacigalupo, [name], MD   lamoTRIgine (LAMICTAL) 150 MG tablet TAKE 2 TABLETS BY MOUTH 2 TIMES PER DAY 09/03/18  Yes Jerrol Banana., MD  LORazepam (ATIVAN) 1 MG tablet Take 1 tablet by mouth every morning, and 1/2 tablet at noon and bedtime. Patient taking differently: Take 0.5-1 mg by mouth every 8 (eight) hours as needed for anxiety or sleep. Take 1 mg (one tablet) by mouth every morning and 0.5 mg (one-half tablet) at noon and at bedtime 12/03/18  Yes Jerrol Banana., MD  losartan (COZAAR) 50 MG tablet TAKE 2 TABLETS (100 MG) BY MOUTH ONCE DAILY 04/11/20  Yes Jerrol Banana., MD  pantoprazole (PROTONIX) 40 MG tablet TAKE 1 TABLET BY MOUTH 2 TIMES PER DAY **DO NOT CRUSH** 12/23/18  Yes Jerrol Banana., MD  senna (SENOKOT) 8.6 MG TABS tablet Take 1 tablet (8.6  mg total) by mouth 2 (two) times daily. 03/01/19  Yes Lavina Hamman, MD  acetaminophen (TYLENOL) 325 MG tablet Take 2 tablets (650 mg total) by mouth every 6 (six) hours as needed. 07/14/17   Jerrol Banana., MD  BD ECLIPSE SYRINGE 25G X 1" 3 ML MISC FOR USE WITH B12 10/02/20   Jerrol Banana., MD  clotrimazole (LOTRIMIN) 1 % cream APPLY TOPICALLY TWICE A DAY AS NEEDED FOR RASH IN SKIN FOLDS 02/05/17   Jerrol Banana., MD  diphenhydrAMINE (BENADRYL) 12.5 MG chewable tablet Chew 1 tablet (12.5 mg total) by mouth 3 (three) times daily. 04/02/18   Jerrol Banana., MD  guaiFENesin-dextromethorphan [facility] DM) 100-10 MG/5ML syrup Take 5 mLs by mouth every 4 (four) hours as needed for cough. 02/07/21   Gwyneth Sprout, FNP  letrozole [facility]) 2.5 MG tablet TAKE 1 TABLET BY MOUTH ONCE DAILY *HAZARDOUS DRUG: WEAR GLOVES* Patient not taking: Reported on 10/17/2020 09/25/19   Jerrol Banana., MD  levETIRAcetam (KEPPRA) 250 MG tablet Take 1 tablet (250 mg total) by mouth 2 (two) times daily for 2 days. Then stop. Patient not taking: No sig reported 03/01/19 03/03/19  Lavina Hamman, MD  LORazepam (ATIVAN) 0.5 MG tablet  Take 1.5 mg by mouth as directed. Take 1.5 mg (three tablets) by mouth one hour prior to dental appointment    [provider]  ondansetron (ZOFRAN ODT) 4 MG disintegrating tablet Take 1 tablet (4 mg total) by mouth every 8 (eight) hours as needed for nausea or vomiting. 06/06/20   Brunetta Jeans, PA-C  sodium chloride (OCEAN) 0.65 % SOLN nasal spray Place 1 spray into both nostrils as needed for congestion. 02/07/21   Gwyneth Sprout, FNP  terbinafine (LAMISIL) 1 % cream USE 2 TIMES A WEEK AS NEEDED 01/22/21   Birdie Sons, MD  THERAPEUTIC SHAMPOO 0.5 % shampoo APPLY TOPICALLY AT BEDTIME AS NEEDED 11/16/20   Jerrol Banana., MD  triamcinolone (KENALOG) 0.025 % ointment APPLY 1 APPLICATION TOPICALLY TWICE A DAY AS NEEDED 01/22/21   Birdie Sons, MD    Scheduled Meds:  budesonide (PULMICORT) nebulizer solution  0.25 mg Nebulization BID   Chlorhexidine Gluconate Cloth  6 each Topical Daily   DULoxetine  30 mg Oral Daily   DULoxetine  60 mg Oral Daily   enoxaparin (LOVENOX) injection  40 mg Subcutaneous Q24H   guaiFENesin  600 mg Oral BID   ipratropium-albuterol  3 mL Nebulization Q6H   lamoTRIgine  300 mg Oral BID   methylPREDNISolone (SOLU-MEDROL) injection  40 mg Intravenous Q12H   midodrine  5 mg Oral TID WC   Continuous Infusions:  azithromycin Stopped (02/14/21 1108)   cefTRIAXone (ROCEPHIN)  IV Stopped (02/14/21 1030)   metronidazole     propofol (DIPRIVAN) infusion 15 mcg/kg/min (02/15/21 0100)   PRN Meds:.acetaminophen **OR** acetaminophen, HYDROcodone bit-homatropine, ipratropium-albuterol, LORazepam, ondansetron **OR** ondansetron (ZOFRAN) IV  Assessment & Plan:  Acute Hypoxic / Hypercapnic Respiratory Failure secondary to multifocal pneumonia and pulmonary edema -continue ventilator support & lung protective strategies -Wean PEEP & FiO2 as tolerated, maintain SpO2 > 90% -Head of bed elevated 30 degrees, VAP protocol in place -Plateau pressures less  than 30 cm H20  -Intermittent chest x-ray & ABG PRN -Daily WUA with SBT per protocol -Ensure adequate pulmonary hygiene  -Will obtain CT chest -Budesonide inhaler nebs BID, bronchodilators PRNBronchodilators PRN -wean sedation/analgesia for RASS goal 0  Sepsis with  septic  shock due to multifocal pneumonia PMHx: MRSA (methicillin resistant staph aureus) culture positive (2012), recurrent pneumonia Chest xray with worsening infection -F/u cultures, trend lactic/ PCT -Monitor WBC/ fever curve -Will broaden coverage with vancomycin, cefepime and Azithromycin -Gentle IVF hydration as needed -Repeat blood cultures until clear; follow up trach aspirate -Pressors for MAP goal >65 -Strict I/O's  History of Seizures -On lamotrigine 300 mg p.o. twice daily -PRN Lorazepam for breakthrough seizures -Seizure precaution  Anxiety and Depression - Continue Lorazepam and Cymbalta  Best practice:  Diet:  NPO Pain/Anxiety/Delirium protocol (if indicated): Yes (RASS goal 0) VAP protocol (if indicated): Yes DVT prophylaxis: LMWH GI prophylaxis: PPI Glucose control:  SSI Yes Central venous access:  N/A Arterial line:  N/A Foley:  Yes, and it is still needed Mobility:  bed rest  PT consulted: N/A Last date of multidisciplinary goals of care discussion [1/4] Code Status:  full code Disposition: ICU   = Goals of Care = Code Status Order: FULL  Primary Emergency Contact: [place], Home Phone: [phone] Wishes to pursue full aggressive treatment and intervention options, including CPR and intubation, but goals of care will be addressed on going with family if that should become necessary.   Critical care time: 45 minutes     Rufina Falco, DNP, CCRN, FNP-C, AGACNP-BC Acute Care Nurse Practitioner  [facility] Pager: [phone] [place] at [facility]  .

## 2021-02-16 ENCOUNTER — Inpatient Hospital Stay: Payer: Medicare Other

## 2021-02-16 DIAGNOSIS — A419 Sepsis, unspecified organism: Secondary | ICD-10-CM

## 2021-02-16 DIAGNOSIS — R6520 Severe sepsis without septic shock: Secondary | ICD-10-CM | POA: Diagnosis not present

## 2021-02-16 LAB — BASIC METABOLIC PANEL
Anion gap: 5 (ref 5–15)
BUN: 24 mg/dL — ABNORMAL HIGH (ref 8–23)
CO2: 28 mmol/L (ref 22–32)
Calcium: 8.2 mg/dL — ABNORMAL LOW (ref 8.9–10.3)
Chloride: 105 mmol/L (ref 98–111)
Creatinine, Ser: 0.52 mg/dL (ref 0.44–1.00)
GFR, Estimated: >60 mL/min (ref >60)
Glucose, Bld: 107 mg/dL — ABNORMAL HIGH (ref 70–99)
Potassium: 4.4 mmol/L (ref 3.5–5.1)
Sodium: 138 mmol/L (ref 135–145)

## 2021-02-16 LAB — CBC
HCT: 27.3 % — ABNORMAL LOW (ref 36.0–46.0)
Hemoglobin: 8.6 g/dL — ABNORMAL LOW (ref 12.0–15.0)
MCH: 29.8 pg (ref 26.0–34.0)
MCHC: 31.5 g/dL (ref 30.0–36.0)
MCV: 94.5 fL (ref 80.0–100.0)
Platelets: 344 10*3/uL (ref 150–400)
RBC: 2.89 MIL/uL — ABNORMAL LOW (ref 3.87–5.11)
RDW: 14.4 % (ref 11.5–15.5)
WBC: 18.5 10*3/uL — ABNORMAL HIGH (ref 4.0–10.5)
nRBC: 0 % (ref 0.0–0.2)

## 2021-02-16 LAB — GLUCOSE, CAPILLARY
Glucose-Capillary: 179 mg/dL — ABNORMAL HIGH (ref 70–99)
Glucose-Capillary: 94 mg/dL (ref 70–99)
Glucose-Capillary: 148 mg/dL — ABNORMAL HIGH (ref 70–99)
Glucose-Capillary: 141 mg/dL — ABNORMAL HIGH (ref 70–99)
Glucose-Capillary: 131 mg/dL — ABNORMAL HIGH (ref 70–99)
Glucose-Capillary: 163 mg/dL — ABNORMAL HIGH (ref 70–99)

## 2021-02-16 LAB — MAGNESIUM: Magnesium: 2.7 mg/dL — ABNORMAL HIGH (ref 1.7–2.4)

## 2021-02-16 LAB — URINE CULTURE: Culture: >=100000 — AB

## 2021-02-16 LAB — PHOSPHORUS: Phosphorus: 3.6 mg/dL (ref 2.5–4.6)

## 2021-02-16 LAB — HEMOGLOBIN A1C
Hgb A1c MFr Bld: 5.2 % (ref 4.8–5.6)
Mean Plasma Glucose: 102.54 mg/dL

## 2021-02-16 LAB — TRIGLYCERIDES: Triglycerides: 51 mg/dL (ref <150)

## 2021-02-16 LAB — LEGIONELLA PNEUMOPHILA SEROGP 1 UR AG: L. pneumophila Serogp 1 Ur Ag: NEGATIVE

## 2021-02-16 MED ORDER — HYDROCORTISONE SOD SUC (PF) 100 MG IJ SOLR
100.0000 mg | Freq: Two times a day (BID) | INTRAMUSCULAR | Status: DC
  Administered 2021-02-16 – 2021-02-17 (×3): 100 mg via INTRAVENOUS
  Filled 2021-02-16 (×3): qty 2

## 2021-02-16 MED ORDER — FUROSEMIDE 10 MG/ML IJ SOLN
40.0000 mg | Freq: Every day | INTRAMUSCULAR | Status: DC
  Administered 2021-02-16 – 2021-02-17 (×2): 40 mg via INTRAVENOUS
  Filled 2021-02-16 (×2): qty 4

## 2021-02-16 MED ORDER — ALBUMIN HUMAN 25 % IV SOLN
12.5000 g | Freq: Every day | INTRAVENOUS | Status: DC
  Administered 2021-02-16 – 2021-02-17 (×2): 12.5 g via INTRAVENOUS
  Filled 2021-02-16 (×2): qty 50

## 2021-02-16 MED ORDER — MIDAZOLAM HCL 2 MG/2ML IJ SOLN
2.0000 mg | Freq: Once | INTRAMUSCULAR | Status: AC
  Administered 2021-02-16: 2 mg via INTRAVENOUS
  Filled 2021-02-16: qty 2

## 2021-02-16 NOTE — Consult Note (Signed)
PHARMACY CONSULT NOTE - FOLLOW UP  Pharmacy Consult for Electrolyte Monitoring and Replacement   Recent Labs: Potassium (mmol/L)  Date Value  02/16/2021 4.4  01/08/2013 4.0   Magnesium (mg/dL)  Date Value  02/16/2021 2.7 (H)   Calcium (mg/dL)  Date Value  02/16/2021 8.2 (L)   Calcium, Total (mg/dL)  Date Value  01/08/2013 9.6   Albumin (g/dL)  Date Value  04/11/2020 4.3  01/08/2013 4.1   Phosphorus (mg/dL)  Date Value  02/16/2021 3.6   Sodium (mmol/L)  Date Value  02/16/2021 138  04/11/2020 141  01/08/2013 136     Assessment: 61yo female w/ h/o learning and intellectual disability, HTN, seizures, cognitive decline, history of left lower breast cancer ER positive, who presented to CC of fall and imbalance. Over next 24hrs progressive acute hypoxic respiratory failure req'ing intubation and admission to ICU. Pharmacy has been consulted for the mgmt of electrolytes.  Goal of Therapy:  [place] WNL  Plan:  Labs improved Scr at baseline. CTM and replace as needed. K: 2.9>4.4. After IV KCL 57meq q1h x2 doses & ~11meq from [facility] yesterday No repletion today. Mg: 1.4>2.7. After MgSO4 IV 2g x1 yesterday Slightly > ULN goal. No repletion today. Phos: 2.0>3.6. After IV Kphos 51mmol (provides ~20meq K+) yesterday. No repletion today.   Lorna Dibble, PharmD, [facility] Clinical Pharmacist 02/16/2021 8:49 AM

## 2021-02-16 NOTE — Plan of Care (Signed)
Discussed in front of patient plan of care for the evening, pain management and ventilator care with no evidence of learning.  Patient is on a ventilator.  Problem: Education: Goal: Knowledge of General Education information will improve Description: Including pain rating scale, medication(s)/side effects and non-pharmacologic comfort measures Outcome: Progressing   Problem: Health Behavior/Discharge Planning: Goal: Ability to manage health-related needs will improve Outcome: Progressing

## 2021-02-16 NOTE — Progress Notes (Signed)
Wake up assessment done, pt started dropping her O2 sats to low 80s, coughing continuously and backing the ventilator, sedation restarted, pt now cal and lightly sedated, no distress noted.

## 2021-02-16 NOTE — Procedures (Signed)
Central Venous Catheter Insertion Procedure Note  CYANN VENTI  [id number]  1960-12-24  Date:02/16/21  Time:12:27 PM   Provider Performing:Aslin Farinas D Dewaine Conger   Procedure: Insertion of Non-tunneled Central Venous [phone]) with US guidance ([id number])   Indication(s) Medication administration and Difficult access  Consent Risks of the procedure as well as the alternatives and risks of each were explained to the patient and/or caregiver.  Consent for the procedure was obtained and is signed in the bedside chart  Anesthesia Topical only with 1% lidocaine   Timeout Verified patient identification, verified procedure, site/side was marked, verified correct patient position, special equipment/implants available, medications/allergies/relevant history reviewed, required imaging and test results available.  Sterile Technique Maximal sterile technique including full sterile barrier drape, hand hygiene, sterile gown, sterile gloves, mask, hair covering, sterile ultrasound probe cover (if used).  Procedure Description Area of catheter insertion was cleaned with chlorhexidine and draped in sterile fashion.  With real-time ultrasound guidance a central venous catheter was placed into the right internal jugular vein. Nonpulsatile blood flow and easy flushing noted in all ports.  The catheter was sutured in place and sterile dressing applied.  Complications/Tolerance None; patient tolerated the procedure well. Chest X-ray is ordered to verify placement for internal jugular or subclavian cannulation.   Chest x-ray is not ordered for femoral cannulation.  EBL Minimal  Specimen(s) None   Line secured at the 15 cm mark. BIOPATCH applied to the insertion site.   Darel Hong, AGACNP-BC [place] [facility] Prefer epic messenger for cross cover needs If after hours, please call E-link

## 2021-02-16 NOTE — Progress Notes (Signed)
NAME:  JEORGIA HELMING, MRN:  [id number], DOB:  1960/03/20, LOS: 3 ADMISSION DATE:  02/20/2021, CONSULTATION DATE:  02/14/2021 REFERRING MD:  Sharion Settler, NP  CHIEF COMPLAINT: SOB   HPI  61 y.o  female with significant PMH of learning and intellectual disability, hypertension, seizures, cognitive decline, history of left lower breast cancer ER positive, who presents emergency department for chief concerns of a fall and imbalance.  ED Course: On arrival to the ED, she was afebrile with heart rate 80 beats/minute, the blood pressure  84/40 mm Hg, the respiratory rate 16 breaths/minute, and the oxygen saturation 95%  on 4L.  An electrocardiogram showed rhythm, and nonspecific ST-T-wave changes. Pertinent Labs /Diagnostics Findings: serum sodium of 135, potassium 4.7, chloride 102, bicarb 27, BUN of 24, serum creatinine of 0.88, nonfasting blood glucose 135, WBC elevated at 17.5, hemoglobin 10.1, platelets of 266.  Lactic acid was not elevated at 1.9.  COVID/influenza A/influenza B PCR were negative. UA was positive for small leukocytes, and nitrates is positive.. Chest xray concerning for multifocal pneumonia.  Patient received 1L bolus NS and was treated with ceftriaxone and Azithromycin. She was admitted to hospital service.  02/16/21- patient is severe critically ill , I met with family and reviewed everything.   Past Medical History   Anxiety, Arthritis, Breast cancer ([place]) (2015), Cancer ([place]) (11/2013), Cataract, Depression, Difficulty swallowing, Falls, GERD (gastroesophageal reflux disease), Hidradenitis (2012), History of hiatal hernia, Hyperlipidemia, Hypertension, Mental retardation, MRSA (methicillin resistant staph aureus) culture positive (2012), Osteoporosis, Pneumonia, and Seizures ([place]).  Significant Hospital Events   1/3: Admitted to hospitalist service with acute hypoxic respiratory failure 1/4: Transferred to the ICU for worsening respiratory status requiring intubation.  PCCM  consulted  Consults:  PCCM  Procedures:  1/4: Nicole intubation  Significant Diagnostic Tests:  1/4: Chest Xray>Marked progression of bilateral airspace disease consistent with worsening infection or edema. 2. Small bilateral pleural effusions, increased since prior study.  Micro Data:  1/4: SARS-CoV-2 PCR> negative 1/4: Influenza PCR> negative 1/4: Blood culture x2> 1/4: Urine Culture> 1/4: MRSA PCR>>  1/4: Strep pneumo urinary antigen> 1/4: Legionella urinary antigen>  Antimicrobials:   Azithromycin 1/4> Ceftriaxone 1/4>x1 Metronidazole 1/4>x1 Vancomycin 1/5> Cefepime 1/5>  OBJECTIVE  Blood pressure (!) 120/58, pulse 69, temperature 97.9 F (36.6 C), temperature source Axillary, resp. rate 16, height 4' 11.02" (1.499 m), weight 49.9 kg, SpO2 90 %.    Vent Mode: PRVC FiO2 (%):  [40 %-60 %] 40 % Set Rate:  [16 bmp] 16 bmp Vt Set:  [400 mL] 400 mL PEEP:  [5 cmH20-10 cmH20] 5 cmH20 Plateau Pressure:  [18 cmH20-20 cmH20] 18 cmH20   Intake/Output Summary (Last 24 hours) at 02/16/2021 1105 Last data filed at 02/16/2021 0600 Gross per 24 hour  Intake 2298.61 ml  Output 730 ml  Net 1568.61 ml   Filed Weights   02/14/21 2019  Weight: 49.9 kg   Physical Examination  GENERAL: 61 year-old critically ill patient lying in the bed intubated and sedated EYES: Pupils equal, round, reactive to light and accommodation. No scleral icterus. Extraocular muscles intact.  HEENT: Head atraumatic, normocephalic. Oropharynx and nasopharynx clear.  NECK:  Supple, no jugular venous distention. No thyroid enlargement, no tenderness.  LUNGS: Decreased breath sounds bilaterally, no wheezing, Moderate rales and rhonchi. No use of accessory muscles of respiration.  CARDIOVASCULAR: S1, S2 normal. No murmurs, rubs, or gallops.  ABDOMEN: Soft, nontender, nondistended. Bowel sounds present. No organomegaly or mass.  EXTREMITIES: No pedal edema, cyanosis, or  clubbing.  NEUROLOGIC: Cranial nerves II  through XII are intact.  Muscle strength not checked. Sensation intact. Gait not checked.  PSYCHIATRIC: The patient is intubated and sedated SKIN: No obvious rash, lesion, or ulcer.   Labs/imaging that I havepersonally reviewed  (right click and "Reselect all SmartList Selections" daily)     Labs   CBC: Recent Labs  Lab 03/04/2021 1216 02/14/21 0614 02/15/21 0457 02/16/21 0545  WBC 17.5* 13.7* 18.4* 18.5*  NEUTROABS  --  10.0*  --   --   HGB 10.1* 10.0* 8.7* 8.6*  HCT 32.4* 31.4* 27.1* 27.3*  MCV 95.9 94.3 95.4 94.5  PLT 266 299 275 344     Basic Metabolic Panel: Recent Labs  Lab 03/12/2021 1216 02/14/21 0614 02/15/21 0457 02/16/21 0545  NA 135 140 138 138  K 4.7 3.8 2.9* 4.4  CL 102 106 105 105  CO2 _0 GLUCOSE 135* 99 448* 107*  BUN 24* 20 18 24*  CREATININE 0.88 0.63 0.82 0.52  CALCIUM 9.2 8.2* 7.1* 8.2*  MG  --  1.9 1.4* 2.7*  PHOS  --  2.1* 2.0* 3.6    GFR: Estimated Creatinine Clearance: 49.7 mL/min (by C-G formula based on SCr of 0.52 mg/dL). Recent Labs  Lab 02/28/2021 1216 03/03/2021 1704 02/25/2021 2245 02/14/21 0614 02/15/21 0457 02/16/21 0545  PROCALCITON  --   --  0.42 <0.10  --   --   WBC 17.5*  --   --  13.7* 18.4* 18.5*  LATICACIDVEN  --  1.9  --   --   --   --      Liver Function Tests: Recent Labs  Lab 02/15/21 1154  PROT 5.8*   No results for input(s): LIPASE, AMYLASE in the last 168 hours. No results for input(s): AMMONIA in the last 168 hours.  ABG    Component Value Date/Time   PHART 7.37 02/15/2021 0050   PCO2ART 45 02/15/2021 0050   PO2ART 70 (L) 02/15/2021 0050   HCO3 26.0 02/15/2021 0050   ACIDBASEDEF 0.9 02/14/2021 2216   O2SAT 93.3 02/15/2021 0050      Coagulation Profile: Recent Labs  Lab 02/14/21 0614  INR 1.0     Cardiac Enzymes: No results for input(s): CKTOTAL, CKMB, CKMBINDEX, TROPONINI in the last 168 hours.  HbA1C: Hgb A1c MFr Bld  Date/Time Value Ref Range Status  02/15/2021 04:57 AM 5.1  4.8 - 5.6 % Final    Comment:    (NOTE) Pre diabetes:          5.7%-6.4%  Diabetes:              >6.4%  Glycemic control for   <7.0% adults with diabetes     CBG: Recent Labs  Lab 02/15/21 1631 02/15/21 2001 02/15/21 2338 02/16/21 0357 02/16/21 0733  GLUCAP 137* 100* 138* 179* 94    Review of Systems:   UNABLE TO OBTAIN PATIENT IS INTUBATED  Past Medical History  She,  has a past medical history of Anxiety, Arthritis, Breast cancer ([place]) (2015), Cancer ([place]) (11/2013), Cataract, Depression, Difficulty swallowing, Falls, GERD (gastroesophageal reflux disease), Hidradenitis (2012), History of hiatal hernia, Hyperlipidemia, Hypertension, Mental retardation, MRSA (methicillin resistant staph aureus) culture positive (2012), Osteoporosis, Pneumonia, and Seizures ([place]).   Surgical History    Past Surgical History:  Procedure Laterality Date   BREAST LUMPECTOMY Left 12/07/2013   T1b, NX, ER/PR positive, HER-2/neu not overexpressed, 7 mm.  Not candidate for chemotherapy/sentinel node/radiation based on mental retardation.  BREAST SURGERY Left 12/07/2013   T1b, Nx; ER+, PR+, her 2 not amplified. 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 She has never used smokeless tobacco. She reports that she does not drink alcohol and does not use drugs.   Family History   Her family history includes  Arthritis in her mother; Autoimmune disease in her brother; Breast cancer in her maternal aunt; Cancer in her paternal grandmother; Cancer (age of onset: 18) in her maternal aunt; Diabetes in her father and mother; Hypertension in her maternal grandmother; Neuropathy in her mother; Parkinson's disease in her father.   Allergies Allergies  Allergen Reactions   Sulfa Antibiotics Other (See Comments)    "Does not know"   Keflex [Cephalexin] Other (See Comments)    "Does not know" unknown     Home Medications  Prior to Admission medications   Medication Sig Start Date End Date Taking? Authorizing Provider  alendronate (FOSAMAX) 70 MG tablet TAKE 1 TABLET  BY MOUTH WEEKLY EARLY MORNING BEFORE FOOD/MEDS WITH WATER. DO NOT LIE DOWN FOR 30 MINUTES 09/03/18  Yes Jerrol Banana., MD  amLODipine (NORVASC) 10 MG tablet TAKE 1 TABLET BY MOUTH AT BEDTIME 09/03/18  Yes Jerrol Banana., MD  BENZOYL PEROXIDE 5 % external wash USE TOPICALLY ONCE DAILY 10/27/19  Yes Jerrol Banana., MD  calcium-vitamin D (OSCAL WITH D) 500-200 MG-UNIT TABS tablet TAKE 1 TABLET BY MOUTH EVERY DAY WITH BREAKFAST 11/03/18  Yes Jerrol Banana., MD  cetirizine (ZYRTEC) 10 MG tablet Take 1 tablet (10 mg total) by mouth daily. 12/23/18  Yes Jerrol Banana., MD  chlorhexidine (PERIDEX) 0.12 % solution Use as directed 15 mLs in the mouth or throat 2 (two) times daily.   Yes [provider]  cyanocobalamin (,VITAMIN B-12,) 1000 MCG/ML injection INJECT 1ML=1000MCG INTRAMUSCULARLY EVERY MONTH (B-12 SUPPLEMENT) 10/02/20  Yes Jerrol Banana., MD  docusate sodium (COLACE) 100 MG capsule TAKE 1 CAPSULE BY MOUTH 2 TIMES PER DAY 09/03/18  Yes Jerrol Banana., MD  DULoxetine (CYMBALTA) 30 MG capsule Take 30 mg by mouth daily.   Yes [provider]  DULoxetine (CYMBALTA) 60 MG capsule Take 1 capsule (60 mg total) by mouth daily. 04/10/18  Yes Jerrol Banana., MD  ipratropium  (ATROVENT) 0.03 % nasal spray Place 2 sprays into both nostrils 3 (three) times daily. 02/24/18  Yes Bacigalupo, [name], MD  lamoTRIgine (LAMICTAL) 150 MG tablet TAKE 2 TABLETS BY MOUTH 2 TIMES PER DAY 09/03/18  Yes Jerrol Banana., MD  LORazepam (ATIVAN) 1 MG tablet Take 1 tablet by mouth every morning, and 1/2 tablet at noon and bedtime. Patient taking differently: Take 0.5-1 mg by mouth every 8 (eight) hours as needed for anxiety or sleep. Take 1 mg (one tablet) by mouth  every morning and 0.5 mg (one-half tablet) at noon and at bedtime 12/03/18  Yes Jerrol Banana., MD  losartan (COZAAR) 50 MG tablet TAKE 2 TABLETS (100 MG) BY MOUTH ONCE DAILY 04/11/20  Yes Jerrol Banana., MD  pantoprazole (PROTONIX) 40 MG tablet TAKE 1 TABLET BY MOUTH 2 TIMES PER DAY **DO NOT CRUSH** 12/23/18  Yes Jerrol Banana., MD  senna (SENOKOT) 8.6 MG TABS tablet Take 1 tablet (8.6 mg total) by mouth 2 (two) times daily. 03/01/19  Yes Lavina Hamman, MD  acetaminophen (TYLENOL) 325 MG tablet Take 2 tablets (650 mg total) by mouth every 6 (six) hours as needed. 07/14/17   Jerrol Banana., MD  BD ECLIPSE SYRINGE 25G X 1" 3 ML MISC FOR USE WITH B12 10/02/20   Jerrol Banana., MD  clotrimazole (LOTRIMIN) 1 % cream APPLY TOPICALLY TWICE A DAY AS NEEDED FOR RASH IN SKIN FOLDS 02/05/17   Jerrol Banana., MD  diphenhydrAMINE (BENADRYL) 12.5 MG chewable tablet Chew 1 tablet (12.5 mg total) by mouth 3 (three) times daily. 04/02/18   Jerrol Banana., MD  guaiFENesin-dextromethorphan [facility] DM) 100-10 MG/5ML syrup Take 5 mLs by mouth every 4 (four) hours as needed for cough. 02/07/21   Gwyneth Sprout, FNP  letrozole [facility]) 2.5 MG tablet TAKE 1 TABLET BY MOUTH ONCE DAILY *HAZARDOUS DRUG: WEAR GLOVES* Patient not taking: Reported on 10/17/2020 09/25/19   Jerrol Banana., MD  levETIRAcetam (KEPPRA) 250 MG tablet Take 1 tablet (250 mg total) by mouth 2 (two) times daily for 2  days. Then stop. Patient not taking: No sig reported 03/01/19 03/03/19  Lavina Hamman, MD  LORazepam (ATIVAN) 0.5 MG tablet Take 1.5 mg by mouth as directed. Take 1.5 mg (three tablets) by mouth one hour prior to dental appointment    [provider]  ondansetron (ZOFRAN ODT) 4 MG disintegrating tablet Take 1 tablet (4 mg total) by mouth every 8 (eight) hours as needed for nausea or vomiting. 06/06/20   Brunetta Jeans, PA-C  sodium chloride (OCEAN) 0.65 % SOLN nasal spray Place 1 spray into both nostrils as needed for congestion. 02/07/21   Gwyneth Sprout, FNP  terbinafine (LAMISIL) 1 % cream USE 2 TIMES A WEEK AS NEEDED 01/22/21   Birdie Sons, MD  THERAPEUTIC SHAMPOO 0.5 % shampoo APPLY TOPICALLY AT BEDTIME AS NEEDED 11/16/20   Jerrol Banana., MD  triamcinolone (KENALOG) 0.025 % ointment APPLY 1 APPLICATION TOPICALLY TWICE A DAY AS NEEDED 01/22/21   Birdie Sons, MD    Scheduled Meds:  budesonide (PULMICORT) nebulizer solution  0.25 mg Nebulization BID   chlorhexidine gluconate (MEDLINE KIT)  15 mL Mouth Rinse BID   Chlorhexidine Gluconate Cloth  6 each Topical Daily   docusate  100 mg Per Tube BID   DULoxetine  30 mg Oral Daily   DULoxetine  60 mg Oral Daily   enoxaparin (LOVENOX) injection  40 mg Subcutaneous Q24H   feeding supplement (PROSource TF)  45 mL Per Tube Daily   free water  30 mL Per Tube Q4H   guaiFENesin  600 mg Oral BID   insulin aspart  0-20 Units Subcutaneous Q4H   ipratropium-albuterol  3 mL Nebulization Q6H   lamoTRIgine  300 mg Per Tube BID   mouth rinse  15 mL Mouth Rinse 10 times per day   midodrine  10 mg Per Tube TID WC   multivitamin  15 mL Per Tube Daily   pantoprazole (PROTONIX) IV  40 mg Intravenous Q24H   polyethylene glycol  17 g Per Tube Daily   Continuous Infusions:  sodium chloride Stopped (02/15/21 0917)   feeding supplement (VITAL AF 1.2 CAL) 1,000 mL (02/15/21 1600)   fentaNYL infusion INTRAVENOUS 100 mcg/hr (02/16/21  0600)   norepinephrine (LEVOPHED) Adult infusion 4 mcg/min (02/16/21 0600)   propofol (DIPRIVAN) infusion 40 mcg/kg/min (02/16/21 0902)   PRN Meds:.acetaminophen (TYLENOL) oral liquid 160 mg/5 mL **OR** acetaminophen, HYDROcodone bit-homatropine, ipratropium-albuterol, LORazepam, ondansetron **OR** ondansetron (ZOFRAN) IV  Assessment & Plan:  Acute Hypoxic / Hypercapnic Respiratory Failure secondary to multifocal pneumonia and pulmonary edema -continue ventilator support & lung protective strategies -Wean PEEP & FiO2 as tolerated, maintain SpO2 > 90% -Head of bed elevated 30 degrees, VAP protocol in place -Plateau pressures less than 30 cm H20  -Intermittent chest x-ray & ABG PRN -Daily WUA with SBT per protocol -Ensure adequate pulmonary hygiene  -Will obtain CT chest -Budesonide inhaler nebs BID, bronchodilators PRNBronchodilators PRN -wean sedation/analgesia for RASS goal 0  Sepsis with  septic shock due to multifocal pneumonia PMHx: MRSA (methicillin resistant staph aureus) culture positive (2012), recurrent pneumonia Chest xray with worsening infection -F/u cultures, trend lactic/ PCT -Monitor WBC/ fever curve -Will broaden coverage with vancomycin, cefepime and Azithromycin -Gentle IVF hydration as needed -Repeat blood cultures until clear; follow up trach aspirate -Pressors for MAP goal >65 -Strict I/O's  History of Seizures -On lamotrigine 300 mg p.o. twice daily -PRN Lorazepam for breakthrough seizures -Seizure precaution  Anxiety and Depression - Continue Lorazepam and Cymbalta  Best practice:  Diet:  NPO Pain/Anxiety/Delirium protocol (if indicated): Yes (RASS goal 0) VAP protocol (if indicated): Yes DVT prophylaxis: LMWH GI prophylaxis: PPI Glucose control:  SSI Yes Central venous access:  N/A Arterial line:  N/A Foley:  Yes, and it is still needed Mobility:  bed rest  PT consulted: N/A Last date of multidisciplinary goals of care discussion [1/4] Code  Status:  full code Disposition: ICU   = Goals of Care = Code Status Order: FULL  Primary Emergency Contact: [place], Home Phone: [phone] Wishes to pursue full aggressive treatment and intervention options, including CPR and intubation, but goals of care will be addressed on going with family if that should become necessary.   Critical care provider statement:   Total critical care time: 33 minutes   Performed by: Lanney Gins MD   Critical care time was exclusive of separately billable procedures and treating other patients.   Critical care was necessary to treat or prevent imminent or life-threatening deterioration.   Critical care was time spent personally by me on the following activities: development of treatment plan with patient and/or surrogate as well as nursing, discussions with consultants, evaluation of patient's response to treatment, examination of patient, obtaining history from patient or surrogate, ordering and performing treatments and interventions, ordering and review of laboratory studies, ordering and review of radiographic studies, pulse oximetry and re-evaluation of patient's condition.    Ottie Glazier, M.D.  Pulmonary & Critical Care Medicine      .

## 2021-02-17 ENCOUNTER — Other Ambulatory Visit: Payer: Self-pay

## 2021-02-17 ENCOUNTER — Inpatient Hospital Stay: Payer: Medicare Other

## 2021-02-17 LAB — BLOOD GAS, ARTERIAL
Acid-Base Excess: 16.2 mmol/L — ABNORMAL HIGH (ref 0.0–2.0)
Acid-Base Excess: 15.7 mmol/L — ABNORMAL HIGH (ref 0.0–2.0)
Bicarbonate: 42.2 mmol/L — ABNORMAL HIGH (ref 20.0–28.0)
Bicarbonate: 40.7 mmol/L — ABNORMAL HIGH (ref 20.0–28.0)
Delivery systems: POSITIVE
Expiratory PAP: 6
Expiratory PAP: 6
FIO2: 1
FIO2: 100
Inspiratory PAP: 12
Inspiratory PAP: 12
O2 Saturation: 94.2 %
O2 Saturation: 94.1 %
Patient temperature: 37
Patient temperature: 37
RATE: 12 resp/min
pCO2 arterial: 58 mmHg — ABNORMAL HIGH (ref 32.0–48.0)
pCO2 arterial: 51 mmHg — ABNORMAL HIGH (ref 32.0–48.0)
pH, Arterial: 7.47 — ABNORMAL HIGH (ref 7.350–7.450)
pH, Arterial: 7.51 — ABNORMAL HIGH (ref 7.350–7.450)
pO2, Arterial: 67 mmHg — ABNORMAL LOW (ref 83.0–108.0)
pO2, Arterial: 64 mmHg — ABNORMAL LOW (ref 83.0–108.0)

## 2021-02-17 LAB — GLUCOSE, CAPILLARY
Glucose-Capillary: 127 mg/dL — ABNORMAL HIGH (ref 70–99)
Glucose-Capillary: 145 mg/dL — ABNORMAL HIGH (ref 70–99)
Glucose-Capillary: 83 mg/dL (ref 70–99)
Glucose-Capillary: 127 mg/dL — ABNORMAL HIGH (ref 70–99)
Glucose-Capillary: 172 mg/dL — ABNORMAL HIGH (ref 70–99)
Glucose-Capillary: 122 mg/dL — ABNORMAL HIGH (ref 70–99)

## 2021-02-17 LAB — COMPREHENSIVE METABOLIC PANEL
ALT: 13 U/L (ref 0–44)
ALT: 12 U/L (ref 0–44)
AST: 16 U/L (ref 15–41)
AST: 18 U/L (ref 15–41)
Albumin: 3.5 g/dL (ref 3.5–5.0)
Albumin: 2.9 g/dL — ABNORMAL LOW (ref 3.5–5.0)
Alkaline Phosphatase: 76 U/L (ref 38–126)
Alkaline Phosphatase: 58 U/L (ref 38–126)
Anion gap: 13 (ref 5–15)
Anion gap: 12 (ref 5–15)
BUN: 30 mg/dL — ABNORMAL HIGH (ref 8–23)
BUN: 29 mg/dL — ABNORMAL HIGH (ref 8–23)
CO2: 38 mmol/L — ABNORMAL HIGH (ref 22–32)
CO2: 36 mmol/L — ABNORMAL HIGH (ref 22–32)
Calcium: 9.4 mg/dL (ref 8.9–10.3)
Calcium: 8.6 mg/dL — ABNORMAL LOW (ref 8.9–10.3)
Chloride: 94 mmol/L — ABNORMAL LOW (ref 98–111)
Chloride: 97 mmol/L — ABNORMAL LOW (ref 98–111)
Creatinine, Ser: 0.66 mg/dL (ref 0.44–1.00)
Creatinine, Ser: 0.65 mg/dL (ref 0.44–1.00)
GFR, Estimated: >60 mL/min (ref >60)
GFR, Estimated: >60 mL/min (ref >60)
Glucose, Bld: 132 mg/dL — ABNORMAL HIGH (ref 70–99)
Glucose, Bld: 173 mg/dL — ABNORMAL HIGH (ref 70–99)
Potassium: 2.9 mmol/L — ABNORMAL LOW (ref 3.5–5.1)
Potassium: 3.5 mmol/L (ref 3.5–5.1)
Sodium: 145 mmol/L (ref 135–145)
Sodium: 145 mmol/L (ref 135–145)
Total Bilirubin: 1 mg/dL (ref 0.3–1.2)
Total Bilirubin: 1.1 mg/dL (ref 0.3–1.2)
Total Protein: 7.3 g/dL (ref 6.5–8.1)
Total Protein: 5.9 g/dL — ABNORMAL LOW (ref 6.5–8.1)

## 2021-02-17 LAB — BASIC METABOLIC PANEL
Anion gap: 6 (ref 5–15)
BUN: 29 mg/dL — ABNORMAL HIGH (ref 8–23)
CO2: 32 mmol/L (ref 22–32)
Calcium: 8.9 mg/dL (ref 8.9–10.3)
Chloride: 102 mmol/L (ref 98–111)
Creatinine, Ser: 0.71 mg/dL (ref 0.44–1.00)
GFR, Estimated: >60 mL/min (ref >60)
Glucose, Bld: 146 mg/dL — ABNORMAL HIGH (ref 70–99)
Potassium: 4.5 mmol/L (ref 3.5–5.1)
Sodium: 140 mmol/L (ref 135–145)

## 2021-02-17 LAB — PHOSPHORUS
Phosphorus: 3.4 mg/dL (ref 2.5–4.6)
Phosphorus: 3.7 mg/dL (ref 2.5–4.6)
Phosphorus: 3.1 mg/dL (ref 2.5–4.6)

## 2021-02-17 LAB — CBC
HCT: 27.6 % — ABNORMAL LOW (ref 36.0–46.0)
Hemoglobin: 8.7 g/dL — ABNORMAL LOW (ref 12.0–15.0)
MCH: 29.7 pg (ref 26.0–34.0)
MCHC: 31.5 g/dL (ref 30.0–36.0)
MCV: 94.2 fL (ref 80.0–100.0)
Platelets: 362 10*3/uL (ref 150–400)
RBC: 2.93 MIL/uL — ABNORMAL LOW (ref 3.87–5.11)
RDW: 14.4 % (ref 11.5–15.5)
WBC: 13.6 10*3/uL — ABNORMAL HIGH (ref 4.0–10.5)
nRBC: 0.4 % — ABNORMAL HIGH (ref 0.0–0.2)

## 2021-02-17 LAB — CULTURE, RESPIRATORY W GRAM STAIN: Culture: NORMAL

## 2021-02-17 LAB — MAGNESIUM
Magnesium: 2.4 mg/dL (ref 1.7–2.4)
Magnesium: 1.8 mg/dL (ref 1.7–2.4)

## 2021-02-17 LAB — BRAIN NATRIURETIC PEPTIDE: B Natriuretic Peptide: 1512.9 pg/mL — ABNORMAL HIGH (ref 0.0–100.0)

## 2021-02-17 MED ORDER — POTASSIUM CHLORIDE 10 MEQ/100ML IV SOLN
10.0000 meq | INTRAVENOUS | Status: AC
  Administered 2021-02-17 – 2021-02-18 (×6): 10 meq via INTRAVENOUS
  Filled 2021-02-17 (×6): qty 100

## 2021-02-17 MED ORDER — HYDROCORTISONE SOD SUC (PF) 100 MG IJ SOLR
50.0000 mg | Freq: Two times a day (BID) | INTRAMUSCULAR | Status: DC
  Administered 2021-02-18: 50 mg via INTRAVENOUS
  Filled 2021-02-17: qty 2

## 2021-02-17 MED ORDER — DIPHENHYDRAMINE HCL 50 MG/ML IJ SOLN: 12.5000 mg | Freq: Three times a day (TID) | INTRAMUSCULAR | Status: DC

## 2021-02-17 MED ORDER — LORAZEPAM 2 MG/ML IJ SOLN: 1.0000 mg | Freq: Once | INTRAMUSCULAR | Status: AC

## 2021-02-17 MED ORDER — MAGNESIUM SULFATE 2 GM/50ML IV SOLN
2.0000 g | Freq: Once | INTRAVENOUS | Status: AC
  Administered 2021-02-17: 2 g via INTRAVENOUS
  Filled 2021-02-17: qty 50

## 2021-02-17 MED ORDER — FENTANYL BOLUS VIA INFUSION
50.0000 ug | INTRAVENOUS | Status: DC | PRN
  Administered 2021-02-17 (×2): 50 ug via INTRAVENOUS
  Filled 2021-02-17: qty 50

## 2021-02-17 MED ORDER — MIDODRINE HCL 5 MG PO TABS
2.5000 mg | ORAL_TABLET | Freq: Three times a day (TID) | ORAL | Status: DC
  Administered 2021-02-19: 2.5 mg
  Filled 2021-02-17: qty 1

## 2021-02-17 MED ORDER — LEVETIRACETAM IN NACL 500 MG/100ML IV SOLN
500.0000 mg | Freq: Two times a day (BID) | INTRAVENOUS | Status: DC
  Administered 2021-02-17 – 2021-02-21 (×7): 500 mg via INTRAVENOUS
  Filled 2021-02-17 (×10): qty 100

## 2021-02-17 MED ORDER — METOPROLOL TARTRATE 5 MG/5ML IV SOLN
INTRAVENOUS | Status: AC
  Filled 2021-02-17: qty 5

## 2021-02-17 MED ORDER — METOPROLOL TARTRATE 5 MG/5ML IV SOLN
2.5000 mg | Freq: Once | INTRAVENOUS | Status: AC
  Administered 2021-02-17: 2.5 mg via INTRAVENOUS

## 2021-02-17 MED ORDER — LORAZEPAM 2 MG/ML IJ SOLN
INTRAMUSCULAR | Status: AC
  Administered 2021-02-17: 1 mg via INTRAVENOUS
  Filled 2021-02-17: qty 1

## 2021-02-17 MED ORDER — FUROSEMIDE 10 MG/ML IJ SOLN
40.0000 mg | Freq: Once | INTRAMUSCULAR | Status: AC
  Administered 2021-02-17: 40 mg via INTRAVENOUS
  Filled 2021-02-17: qty 4

## 2021-02-17 NOTE — Consult Note (Signed)
PHARMACY CONSULT NOTE - FOLLOW UP  Pharmacy Consult for Electrolyte Monitoring and Replacement   Recent Labs: Potassium (mmol/L)  Date Value  02/17/2021 4.5  01/08/2013 4.0   Magnesium (mg/dL)  Date Value  02/17/2021 2.4   Calcium (mg/dL)  Date Value  02/17/2021 8.9   Calcium, Total (mg/dL)  Date Value  01/08/2013 9.6   Albumin (g/dL)  Date Value  04/11/2020 4.3  01/08/2013 4.1   Phosphorus (mg/dL)  Date Value  02/17/2021 3.4   Sodium (mmol/L)  Date Value  02/17/2021 140  04/11/2020 141  01/08/2013 136     Assessment: 61yo female w/ h/o learning and intellectual disability, HTN, seizures, cognitive decline, history of left lower breast cancer ER positive, who presented to CC of fall and imbalance. Over next 24hrs progressive acute hypoxic respiratory failure req'ing intubation and admission to ICU. Pharmacy has been consulted for the mgmt of electrolytes.  Goal of Therapy:  [place] WNL  Plan:  No replacement needed today.  F/u with AM labs.    Eleonore Chiquito, PharmD,  Clinical Pharmacist 02/17/2021 9:48 AM

## 2021-02-17 NOTE — Progress Notes (Signed)
NAME:  Helen Patton, MRN:  [id number], DOB:  1960-07-18, LOS: 4 ADMISSION DATE:  02/27/2021, CONSULTATION DATE:  02/14/2021 REFERRING MD:  Sharion Settler, NP  CHIEF COMPLAINT: SOB   HPI  61 y.o  female with significant PMH of learning and intellectual disability, hypertension, seizures, cognitive decline, history of left lower breast cancer ER positive, who presents emergency department for chief concerns of a fall and imbalance.   02/16/21- patient is severe critically ill , I met with family and reviewed everything.  02/18/20-  extubated today   Past Medical History   Anxiety, Arthritis, Breast cancer ([place]) (2015), Cancer ([place]) (11/2013), Cataract, Depression, Difficulty swallowing, Falls, GERD (gastroesophageal reflux disease), Hidradenitis (2012), History of hiatal hernia, Hyperlipidemia, Hypertension, Mental retardation, MRSA (methicillin resistant staph aureus) culture positive (2012), Osteoporosis, Pneumonia, and Seizures ([place]).  Significant Hospital Events   1/3: Admitted to hospitalist service with acute hypoxic respiratory failure 1/4: Transferred to the ICU for worsening respiratory status requiring intubation.  PCCM consulted  Consults:  PCCM  Procedures:  1/4: Nicole intubation  Significant Diagnostic Tests:  1/4: Chest Xray>Marked progression of bilateral airspace disease consistent with worsening infection or edema. 2. Small bilateral pleural effusions, increased since prior study.  Micro Data:  1/4: SARS-CoV-2 PCR> negative 1/4: Influenza PCR> negative 1/4: Blood culture x2> 1/4: Urine Culture> 1/4: MRSA PCR>>  1/4: Strep pneumo urinary antigen> 1/4: Legionella urinary antigen>  Antimicrobials:   Azithromycin 1/4> Ceftriaxone 1/4>x1 Metronidazole 1/4>x1 Vancomycin 1/5> Cefepime 1/5>  OBJECTIVE  Blood pressure (!) 160/90, pulse (!) 109, temperature 99 F (37.2 C), temperature source Oral, resp. rate 18, height 4' 11.02" (1.499 m), weight 49.9 kg, SpO2 92  %. CVP:  [2 mmHg-25 mmHg] 7 mmHg  Vent Mode: PRVC FiO2 (%):  [30 %-100 %] 100 % Set Rate:  [16 bmp] 16 bmp Vt Set:  [400 mL] 400 mL PEEP:  [10 cmH20] 10 cmH20 Plateau Pressure:  [20 cmH20] 20 cmH20   Intake/Output Summary (Last 24 hours) at 02/17/2021 1147 Last data filed at 02/17/2021 1037 Gross per 24 hour  Intake 2450.54 ml  Output 3975 ml  Net -1524.46 ml    Filed Weights   02/14/21 2019  Weight: 49.9 kg   Physical Examination  GENERAL: 61 year-old  EYES: Pupils equal, round, reactive to light and accommodation. No scleral icterus. Extraocular muscles intact.  HEENT: Head atraumatic, normocephalic. Oropharynx and nasopharynx clear.  NECK:  Supple, no jugular venous distention. No thyroid enlargement, no tenderness.  LUNGS: Decreased breath sounds bilaterally, no wheezing, Moderate rales and rhonchi. No use of accessory muscles of respiration.  CARDIOVASCULAR: S1, S2 normal. No murmurs, rubs, or gallops.  ABDOMEN: Soft, nontender, nondistended. Bowel sounds present. No organomegaly or mass.  EXTREMITIES: No pedal edema, cyanosis, or clubbing.  NEUROLOGIC: Cranial nerves II through XII are intact.  Muscle strength not checked. Sensation intact. Gait not checked.  PSYCHIATRIC: The patient is intubated and sedated SKIN: No obvious rash, lesion, or ulcer.   Labs/imaging that I havepersonally reviewed  (right click and "Reselect all SmartList Selections" daily)     Labs   CBC: Recent Labs  Lab 02/26/2021 1216 02/14/21 0614 02/15/21 0457 02/16/21 0545 02/17/21 0440  WBC 17.5* 13.7* 18.4* 18.5* 13.6*  NEUTROABS  --  10.0*  --   --   --   HGB 10.1* 10.0* 8.7* 8.6* 8.7*  HCT 32.4* 31.4* 27.1* 27.3* 27.6*  MCV 95.9 94.3 95.4 94.5 94.2  PLT 266 299 275 344 362  Basic Metabolic Panel: Recent Labs  Lab 02/26/2021 1216 02/14/21 0614 02/15/21 0457 02/16/21 0545 02/17/21 0440  NA 135 140 138 138 140  K 4.7 3.8 2.9* 4.4 4.5  CL 102 106 105 105 102  CO2 27 23 23 28  32   GLUCOSE 135* 99 448* 107* 146*  BUN 24* 20 18 24* 29*  CREATININE 0.88 0.63 0.82 0.52 0.71  CALCIUM 9.2 8.2* 7.1* 8.2* 8.9  MG  --  1.9 1.4* 2.7* 2.4  PHOS  --  2.1* 2.0* 3.6 3.4    GFR: Estimated Creatinine Clearance: 49.7 mL/min (by C-G formula based on SCr of 0.71 mg/dL). Recent Labs  Lab 03/13/2021 1704 03/04/2021 2245 02/14/21 0614 02/15/21 0457 02/16/21 0545 02/17/21 0440  PROCALCITON  --  0.42 <0.10  --   --   --   WBC  --   --  13.7* 18.4* 18.5* 13.6*  LATICACIDVEN 1.9  --   --   --   --   --      Liver Function Tests: Recent Labs  Lab 02/15/21 1154  PROT 5.8*    No results for input(s): LIPASE, AMYLASE in the last 168 hours. No results for input(s): AMMONIA in the last 168 hours.  ABG    Component Value Date/Time   PHART 7.37 02/15/2021 0050   PCO2ART 45 02/15/2021 0050   PO2ART 70 (L) 02/15/2021 0050   HCO3 26.0 02/15/2021 0050   ACIDBASEDEF 0.9 02/14/2021 2216   O2SAT 93.3 02/15/2021 0050      Coagulation Profile: Recent Labs  Lab 02/14/21 0614  INR 1.0     Cardiac Enzymes: No results for input(s): CKTOTAL, CKMB, CKMBINDEX, TROPONINI in the last 168 hours.  HbA1C: Hgb A1c MFr Bld  Date/Time Value Ref Range Status  02/16/2021 05:45 AM 5.2 4.8 - 5.6 % Final    Comment:    (NOTE) Pre diabetes:          5.7%-6.4%  Diabetes:              >6.4%  Glycemic control for   <7.0% adults with diabetes   02/15/2021 04:57 AM 5.1 4.8 - 5.6 % Final    Comment:    (NOTE) Pre diabetes:          5.7%-6.4%  Diabetes:              >6.4%  Glycemic control for   <7.0% adults with diabetes     CBG: Recent Labs  Lab 02/16/21 1927 02/16/21 2312 02/17/21 0453 02/17/21 0731 02/17/21 1142  GLUCAP 131* 163* 127* 145* 83     Review of Systems:   UNABLE TO OBTAIN PATIENT IS INTUBATED  Past Medical History  She,  has a past medical history of Anxiety, Arthritis, Breast cancer ([place]) (2015), Cancer ([place]) (11/2013), Cataract, Depression,  Difficulty swallowing, Falls, GERD (gastroesophageal reflux disease), Hidradenitis (2012), History of hiatal hernia, Hyperlipidemia, Hypertension, Mental retardation, MRSA (methicillin resistant staph aureus) culture positive (2012), Osteoporosis, Pneumonia, and Seizures ([place]).   Surgical History    Past Surgical History:  Procedure Laterality Date   BREAST LUMPECTOMY Left 12/07/2013   T1b, NX, ER/PR positive, HER-2/neu not overexpressed, 7 mm.  Not candidate for chemotherapy/sentinel node/radiation based on mental retardation.   BREAST SURGERY Left 12/07/2013   T1b, Nx; ER+, PR+, her 2 not amplified. 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 HIP  08/23/2015  Procedure: CONVERSION TO TOTAL HIP;  Surgeon: Dereck Leep, MD;  Location: [facility] ORS;  Service: Orthopedics;;   dislocated hip     HARDWARE REMOVAL  08/23/2015   Procedure: HARDWARE REMOVAL;  Surgeon: Dereck Leep, MD;  Location: [facility] ORS;  Service: Orthopedics;;   TOTAL HIP REVISION Right 08/23/2015   Procedure: TOTAL HIP REVISION;  Surgeon: Dereck Leep, MD;  Location: [facility] ORS;  Service: Orthopedics;  Laterality: Right;     Social History   reports that she has never smoked. She has never used smokeless tobacco. She reports that she does not drink alcohol and does not use drugs.   Family History   Her family history includes Arthritis in her mother; Autoimmune disease in her brother; Breast cancer in her maternal aunt; Cancer in her paternal grandmother; Cancer (age of onset: 41) in her maternal aunt; Diabetes in her father and mother; Hypertension in her maternal grandmother; Neuropathy in her mother; Parkinson's disease in her father.   Allergies Allergies  Allergen Reactions   Sulfa Antibiotics Other (See Comments)    "Does not know"   Keflex [Cephalexin] Other (See Comments)    "Does not know" unknown     Home Medications   Prior to Admission medications   Medication Sig Start Date End Date Taking? Authorizing Provider  alendronate (FOSAMAX) 70 MG tablet TAKE 1 TABLET  BY MOUTH WEEKLY EARLY MORNING BEFORE FOOD/MEDS WITH WATER. DO NOT LIE DOWN FOR 30 MINUTES 09/03/18  Yes Jerrol Banana., MD  amLODipine (NORVASC) 10 MG tablet TAKE 1 TABLET BY MOUTH AT BEDTIME 09/03/18  Yes Jerrol Banana., MD  BENZOYL PEROXIDE 5 % external wash USE TOPICALLY ONCE DAILY 10/27/19  Yes Jerrol Banana., MD  calcium-vitamin D (OSCAL WITH D) 500-200 MG-UNIT TABS tablet TAKE 1 TABLET BY MOUTH EVERY DAY WITH BREAKFAST 11/03/18  Yes Jerrol Banana., MD  cetirizine (ZYRTEC) 10 MG tablet Take 1 tablet (10 mg total) by mouth daily. 12/23/18  Yes Jerrol Banana., MD  chlorhexidine (PERIDEX) 0.12 % solution Use as directed 15 mLs in the mouth or throat 2 (two) times daily.   Yes [provider]  cyanocobalamin (,VITAMIN B-12,) 1000 MCG/ML injection INJECT 1ML=1000MCG INTRAMUSCULARLY EVERY MONTH (B-12 SUPPLEMENT) 10/02/20  Yes Jerrol Banana., MD  docusate sodium (COLACE) 100 MG capsule TAKE 1 CAPSULE BY MOUTH 2 TIMES PER DAY 09/03/18  Yes Jerrol Banana., MD  DULoxetine (CYMBALTA) 30 MG capsule Take 30 mg by mouth daily.   Yes [provider]  DULoxetine (CYMBALTA) 60 MG capsule Take 1 capsule (60 mg total) by mouth daily. 04/10/18  Yes Jerrol Banana., MD  ipratropium (ATROVENT) 0.03 % nasal spray Place 2 sprays into both nostrils 3 (three) times daily. 02/24/18  Yes Bacigalupo, [name], MD  lamoTRIgine (LAMICTAL) 150 MG tablet TAKE 2 TABLETS BY MOUTH 2 TIMES PER DAY 09/03/18  Yes Jerrol Banana., MD  LORazepam (ATIVAN) 1 MG tablet Take 1 tablet by mouth every morning, and 1/2 tablet at noon and bedtime. Patient taking differently: Take 0.5-1 mg by mouth every 8 (eight) hours as needed for anxiety or sleep. Take 1 mg (one tablet) by mouth every morning and 0.5 mg (one-half  tablet) at noon and at bedtime 12/03/18  Yes Jerrol Banana., MD  losartan (COZAAR) 50 MG tablet TAKE 2 TABLETS (100 MG) BY MOUTH ONCE DAILY 04/11/20  Yes Jerrol Banana., MD  pantoprazole (PROTONIX) 40 MG tablet TAKE 1 TABLET BY MOUTH 2 TIMES PER DAY **DO NOT CRUSH** 12/23/18  Yes Jerrol Banana., MD  senna (SENOKOT) 8.6 MG TABS tablet Take 1 tablet (8.6 mg total) by mouth 2 (two) times daily. 03/01/19  Yes Lavina Hamman, MD  acetaminophen (TYLENOL) 325 MG tablet Take 2 tablets (650 mg total) by mouth every 6 (six) hours as needed. 07/14/17   Jerrol Banana., MD  BD ECLIPSE SYRINGE 25G X 1" 3 ML MISC FOR USE WITH B12 10/02/20   Jerrol Banana., MD  clotrimazole (LOTRIMIN) 1 % cream APPLY TOPICALLY TWICE A DAY AS NEEDED FOR RASH IN SKIN FOLDS 02/05/17   Jerrol Banana., MD  diphenhydrAMINE (BENADRYL) 12.5 MG chewable tablet Chew 1 tablet (12.5 mg total) by mouth 3 (three) times daily. 04/02/18   Jerrol Banana., MD  guaiFENesin-dextromethorphan [facility] DM) 100-10 MG/5ML syrup Take 5 mLs by mouth every 4 (four) hours as needed for cough. 02/07/21   Gwyneth Sprout, FNP  letrozole [facility]) 2.5 MG tablet TAKE 1 TABLET BY MOUTH ONCE DAILY *HAZARDOUS DRUG: WEAR GLOVES* Patient not taking: Reported on 10/17/2020 09/25/19   Jerrol Banana., MD  levETIRAcetam (KEPPRA) 250 MG tablet Take 1 tablet (250 mg total) by mouth 2 (two) times daily for 2 days. Then stop. Patient not taking: No sig reported 03/01/19 03/03/19  Lavina Hamman, MD  LORazepam (ATIVAN) 0.5 MG tablet Take 1.5 mg by mouth as directed. Take 1.5 mg (three tablets) by mouth one hour prior to dental appointment    [provider]  ondansetron (ZOFRAN ODT) 4 MG disintegrating tablet Take 1 tablet (4 mg total) by mouth every 8 (eight) hours as needed for nausea or vomiting. 06/06/20   Brunetta Jeans, PA-C  sodium chloride (OCEAN) 0.65 % SOLN nasal spray Place 1 spray into both nostrils as  needed for congestion. 02/07/21   Gwyneth Sprout, FNP  terbinafine (LAMISIL) 1 % cream USE 2 TIMES A WEEK AS NEEDED 01/22/21   Birdie Sons, MD  THERAPEUTIC SHAMPOO 0.5 % shampoo APPLY TOPICALLY AT BEDTIME AS NEEDED 11/16/20   Jerrol Banana., MD  triamcinolone (KENALOG) 0.025 % ointment APPLY 1 APPLICATION TOPICALLY TWICE A DAY AS NEEDED 01/22/21   Birdie Sons, MD    Scheduled Meds:  budesonide (PULMICORT) nebulizer solution  0.25 mg Nebulization BID   chlorhexidine gluconate (MEDLINE KIT)  15 mL Mouth Rinse BID   Chlorhexidine Gluconate Cloth  6 each Topical Daily   docusate  100 mg Per Tube BID   DULoxetine  30 mg Oral Daily   DULoxetine  60 mg Oral Daily   enoxaparin (LOVENOX) injection  40 mg Subcutaneous Q24H   feeding supplement (PROSource TF)  45 mL Per Tube Daily   free water  30 mL Per Tube Q4H   furosemide  40 mg Intravenous Daily   guaiFENesin  600 mg Oral BID   hydrocortisone sod succinate (SOLU-CORTEF) inj  100 mg Intravenous Q12H   insulin aspart  0-20 Units Subcutaneous Q4H   ipratropium-albuterol  3 mL Nebulization Q6H   lamoTRIgine  300 mg Per Tube BID   mouth rinse  15 mL Mouth Rinse 10 times per day   midodrine  10 mg Per Tube TID WC   multivitamin  15 mL Per Tube Daily   pantoprazole (PROTONIX) IV  40 mg Intravenous Q24H   polyethylene glycol  17 g Per  Tube Daily   Continuous Infusions:  sodium chloride Stopped (02/15/21 0917)   albumin human Stopped (02/16/21 1438)   feeding supplement (VITAL AF 1.2 CAL) Stopped (02/17/21 0900)   fentaNYL infusion INTRAVENOUS 150 mcg/hr (02/17/21 1000)   norepinephrine (LEVOPHED) Adult infusion 3 mcg/min (02/17/21 1000)   propofol (DIPRIVAN) infusion Stopped (02/17/21 0953)   PRN Meds:.acetaminophen (TYLENOL) oral liquid 160 mg/5 mL **OR** acetaminophen, fentaNYL, HYDROcodone bit-homatropine, ipratropium-albuterol, LORazepam, ondansetron **OR** ondansetron (ZOFRAN) IV  Assessment & Plan:  Acute Hypoxic /  Hypercapnic Respiratory Failure secondary to multifocal pneumonia and pulmonary edema -continue ventilator support & lung protective strategies -Wean PEEP & FiO2 as tolerated, maintain SpO2 > 90% -Head of bed elevated 30 degrees, VAP protocol in place -Plateau pressures less than 30 cm H20  -Intermittent chest x-ray & ABG PRN -Daily WUA with SBT per protocol -Ensure adequate pulmonary hygiene  -Will obtain CT chest -Budesonide inhaler nebs BID, bronchodilators PRNBronchodilators PRN -wean sedation/analgesia for RASS goal 0  Sepsis with  septic shock due to multifocal pneumonia PMHx: MRSA (methicillin resistant staph aureus) culture positive (2012), recurrent pneumonia Chest xray with worsening infection -F/u cultures, trend lactic/ PCT -Monitor WBC/ fever curve -Will broaden coverage with vancomycin, cefepime and Azithromycin -Gentle IVF hydration as needed -Repeat blood cultures until clear; follow up trach aspirate -Pressors for MAP goal >65 -Strict I/O's  History of Seizures -On lamotrigine 300 mg p.o. twice daily -PRN Lorazepam for breakthrough seizures -Seizure precaution  Anxiety and Depression - Continue Lorazepam and Cymbalta  Best practice:  Diet:  NPO Pain/Anxiety/Delirium protocol (if indicated): Yes (RASS goal 0) VAP protocol (if indicated): Yes DVT prophylaxis: LMWH GI prophylaxis: PPI Glucose control:  SSI Yes Central venous access:  N/A Arterial line:  N/A Foley:  Yes, and it is still needed Mobility:  bed rest  PT consulted: N/A Last date of multidisciplinary goals of care discussion [1/4] Code Status:  full code Disposition: ICU   = Goals of Care = Code Status Order: FULL  Primary Emergency Contact: [place], Home Phone: [phone] Wishes to pursue full aggressive treatment and intervention options, including CPR and intubation, but goals of care will be addressed on going with family if that should become necessary.   Critical care  provider statement:   Total critical care time: 33 minutes   Performed by: Lanney Gins MD   Critical care time was exclusive of separately billable procedures and treating other patients.   Critical care was necessary to treat or prevent imminent or life-threatening deterioration.   Critical care was time spent personally by me on the following activities: development of treatment plan with patient and/or surrogate as well as nursing, discussions with consultants, evaluation of patient's response to treatment, examination of patient, obtaining history from patient or surrogate, ordering and performing treatments and interventions, ordering and review of laboratory studies, ordering and review of radiographic studies, pulse oximetry and re-evaluation of patient's condition.    Ottie Glazier, M.D.  Pulmonary & Critical Care Medicine      .

## 2021-02-17 NOTE — Progress Notes (Signed)
Pt. Extubated to 15l hfnc,no apparent distress at this time,sat 90.

## 2021-02-18 ENCOUNTER — Encounter: Payer: Self-pay | Admitting: Internal Medicine

## 2021-02-18 ENCOUNTER — Inpatient Hospital Stay: Payer: Medicare Other

## 2021-02-18 DIAGNOSIS — J9691 Respiratory failure, unspecified with hypoxia: Secondary | ICD-10-CM

## 2021-02-18 DIAGNOSIS — I5021 Acute systolic (congestive) heart failure: Secondary | ICD-10-CM

## 2021-02-18 DIAGNOSIS — A4189 Other specified sepsis: Secondary | ICD-10-CM

## 2021-02-18 LAB — BLOOD GAS, ARTERIAL
Acid-Base Excess: 16 mmol/L — ABNORMAL HIGH (ref 0.0–2.0)
Acid-Base Excess: 14.2 mmol/L — ABNORMAL HIGH (ref 0.0–2.0)
Bicarbonate: 40.2 mmol/L — ABNORMAL HIGH (ref 20.0–28.0)
Bicarbonate: 39.5 mmol/L — ABNORMAL HIGH (ref 20.0–28.0)
Delivery systems: POSITIVE
Expiratory PAP: 6
Expiratory PAP: 6
FIO2: 100
FIO2: 1
Inspiratory PAP: 12
Inspiratory PAP: 12
O2 Saturation: 97.5 %
O2 Saturation: 88.4 %
Patient temperature: 37
Patient temperature: 37
RATE: 12 resp/min
pCO2 arterial: 47 mmHg (ref 32.0–48.0)
pCO2 arterial: 53 mmHg — ABNORMAL HIGH (ref 32.0–48.0)
pH, Arterial: 7.54 — ABNORMAL HIGH (ref 7.350–7.450)
pH, Arterial: 7.48 — ABNORMAL HIGH (ref 7.350–7.450)
pO2, Arterial: 85 mmHg (ref 83.0–108.0)
pO2, Arterial: 51 mmHg — ABNORMAL LOW (ref 83.0–108.0)

## 2021-02-18 LAB — GLUCOSE, CAPILLARY
Glucose-Capillary: 139 mg/dL — ABNORMAL HIGH (ref 70–99)
Glucose-Capillary: 118 mg/dL — ABNORMAL HIGH (ref 70–99)
Glucose-Capillary: 184 mg/dL — ABNORMAL HIGH (ref 70–99)
Glucose-Capillary: 233 mg/dL — ABNORMAL HIGH (ref 70–99)
Glucose-Capillary: 150 mg/dL — ABNORMAL HIGH (ref 70–99)
Glucose-Capillary: 163 mg/dL — ABNORMAL HIGH (ref 70–99)

## 2021-02-18 LAB — CBC
HCT: 27.2 % — ABNORMAL LOW (ref 36.0–46.0)
Hemoglobin: 8.5 g/dL — ABNORMAL LOW (ref 12.0–15.0)
MCH: 29 pg (ref 26.0–34.0)
MCHC: 31.3 g/dL (ref 30.0–36.0)
MCV: 92.8 fL (ref 80.0–100.0)
Platelets: 330 10*3/uL (ref 150–400)
RBC: 2.93 MIL/uL — ABNORMAL LOW (ref 3.87–5.11)
RDW: 14.2 % (ref 11.5–15.5)
WBC: 8.6 10*3/uL (ref 4.0–10.5)
nRBC: 0.6 % — ABNORMAL HIGH (ref 0.0–0.2)

## 2021-02-18 LAB — MAGNESIUM: Magnesium: 2.5 mg/dL — ABNORMAL HIGH (ref 1.7–2.4)

## 2021-02-18 LAB — BASIC METABOLIC PANEL
Anion gap: 10 (ref 5–15)
BUN: 30 mg/dL — ABNORMAL HIGH (ref 8–23)
CO2: 36 mmol/L — ABNORMAL HIGH (ref 22–32)
Calcium: 8.1 mg/dL — ABNORMAL LOW (ref 8.9–10.3)
Chloride: 100 mmol/L (ref 98–111)
Creatinine, Ser: 0.56 mg/dL (ref 0.44–1.00)
GFR, Estimated: >60 mL/min (ref >60)
Glucose, Bld: 157 mg/dL — ABNORMAL HIGH (ref 70–99)
Potassium: 4.6 mmol/L (ref 3.5–5.1)
Sodium: 146 mmol/L — ABNORMAL HIGH (ref 135–145)

## 2021-02-18 LAB — CULTURE, BLOOD (ROUTINE X 2)
Culture: NO GROWTH
Culture: NO GROWTH
Special Requests: ADEQUATE
Special Requests: ADEQUATE

## 2021-02-18 LAB — PHOSPHORUS: Phosphorus: 2.6 mg/dL (ref 2.5–4.6)

## 2021-02-18 LAB — CORTISOL: Cortisol, Plasma: 42.1 ug/dL

## 2021-02-18 MED ORDER — LACTATED RINGERS IV BOLUS
250.0000 mL | Freq: Once | INTRAVENOUS | Status: AC
  Administered 2021-02-18: 250 mL via INTRAVENOUS

## 2021-02-18 MED ORDER — DOCUSATE SODIUM 50 MG/5ML PO LIQD
100.0000 mg | Freq: Two times a day (BID) | ORAL | Status: DC
  Administered 2021-02-18 – 2021-02-20 (×4): 100 mg
  Filled 2021-02-18 (×4): qty 10

## 2021-02-18 MED ORDER — MIDAZOLAM HCL 2 MG/2ML IJ SOLN
INTRAMUSCULAR | Status: AC
  Administered 2021-02-18: 2 mg
  Filled 2021-02-18: qty 2

## 2021-02-18 MED ORDER — FENTANYL CITRATE (PF) 100 MCG/2ML IJ SOLN
INTRAMUSCULAR | Status: AC
  Filled 2021-02-18: qty 2

## 2021-02-18 MED ORDER — VASOPRESSIN 20 UNITS/100 ML INFUSION FOR SHOCK
0.0000 [IU]/min | INTRAVENOUS | Status: DC
  Administered 2021-02-18 – 2021-02-19 (×3): 0.03 [IU]/min via INTRAVENOUS
  Administered 2021-02-19: 0.02 [IU]/min via INTRAVENOUS
  Filled 2021-02-18 (×5): qty 100

## 2021-02-18 MED ORDER — FENTANYL 2500MCG IN NS 250ML (10MCG/ML) PREMIX INFUSION
INTRAVENOUS | Status: AC
  Filled 2021-02-18: qty 250

## 2021-02-18 MED ORDER — ROCURONIUM BROMIDE 10 MG/ML (PF) SYRINGE
40.0000 mg | PREFILLED_SYRINGE | Freq: Once | INTRAVENOUS | Status: AC
  Filled 2021-02-18: qty 4

## 2021-02-18 MED ORDER — MIDAZOLAM HCL 2 MG/2ML IJ SOLN
2.0000 mg | INTRAMUSCULAR | Status: DC | PRN
  Administered 2021-02-19 – 2021-02-20 (×3): 2 mg via INTRAVENOUS
  Filled 2021-02-18 (×3): qty 2

## 2021-02-18 MED ORDER — NOREPINEPHRINE 4 MG/250ML-% IV SOLN
INTRAVENOUS | Status: AC
  Filled 2021-02-18: qty 250

## 2021-02-18 MED ORDER — HYDROCORTISONE SOD SUC (PF) 100 MG IJ SOLR
50.0000 mg | Freq: Two times a day (BID) | INTRAMUSCULAR | Status: DC
  Administered 2021-02-18 – 2021-02-20 (×5): 50 mg via INTRAVENOUS
  Filled 2021-02-18 (×5): qty 2

## 2021-02-18 MED ORDER — DEXMEDETOMIDINE HCL IN NACL 400 MCG/100ML IV SOLN
0.4000 ug/kg/h | INTRAVENOUS | Status: DC
  Administered 2021-02-18: 1.2 ug/kg/h via INTRAVENOUS
  Administered 2021-02-18: 0.4 ug/kg/h via INTRAVENOUS
  Administered 2021-02-18: 0.5 ug/kg/h via INTRAVENOUS
  Administered 2021-02-19 – 2021-02-20 (×5): 1.2 ug/kg/h via INTRAVENOUS
  Filled 2021-02-18 (×8): qty 100

## 2021-02-18 MED ORDER — CEFAZOLIN SODIUM-DEXTROSE 1-4 GM/50ML-% IV SOLN
1.0000 g | Freq: Three times a day (TID) | INTRAVENOUS | Status: DC
  Administered 2021-02-18 – 2021-02-20 (×6): 1 g via INTRAVENOUS
  Filled 2021-02-18 (×7): qty 50

## 2021-02-18 MED ORDER — MIDAZOLAM HCL 2 MG/2ML IJ SOLN
2.0000 mg | Freq: Once | INTRAMUSCULAR | Status: AC
  Administered 2021-02-18: 2 mg via INTRAVENOUS

## 2021-02-18 MED ORDER — FENTANYL 2500MCG IN NS 250ML (10MCG/ML) PREMIX INFUSION
0.0000 ug/h | INTRAVENOUS | Status: DC
  Administered 2021-02-18: 100 ug/h via INTRAVENOUS
  Administered 2021-02-18 – 2021-02-19 (×3): 325 ug/h via INTRAVENOUS
  Administered 2021-02-19: 300 ug/h via INTRAVENOUS
  Administered 2021-02-20: 350 ug/h via INTRAVENOUS
  Filled 2021-02-18 (×5): qty 250

## 2021-02-18 MED ORDER — POLYETHYLENE GLYCOL 3350 17 G PO PACK
17.0000 g | PACK | Freq: Every day | ORAL | Status: DC
  Administered 2021-02-18 – 2021-02-20 (×3): 17 g
  Filled 2021-02-18 (×3): qty 1

## 2021-02-18 MED ORDER — NOREPINEPHRINE 16 MG/250ML-% IV SOLN
0.0000 ug/min | INTRAVENOUS | Status: DC
  Administered 2021-02-18: 2 ug/min via INTRAVENOUS
  Filled 2021-02-18: qty 250

## 2021-02-18 MED ORDER — LORAZEPAM 2 MG/ML IJ SOLN
INTRAMUSCULAR | Status: AC
  Filled 2021-02-18: qty 1

## 2021-02-18 MED ORDER — ROCURONIUM BROMIDE 10 MG/ML (PF) SYRINGE
PREFILLED_SYRINGE | INTRAVENOUS | Status: AC
  Administered 2021-02-18: 100 mg
  Filled 2021-02-18: qty 10

## 2021-02-18 MED ORDER — FENTANYL CITRATE (PF) 100 MCG/2ML IJ SOLN
100.0000 ug | Freq: Once | INTRAMUSCULAR | Status: AC
  Administered 2021-02-18: 100 ug via INTRAVENOUS

## 2021-02-18 MED ORDER — FUROSEMIDE 10 MG/ML IJ SOLN: 40.0000 mg | Freq: Two times a day (BID) | INTRAMUSCULAR | Status: DC

## 2021-02-18 MED ORDER — ALBUMIN HUMAN 25 % IV SOLN
12.5000 g | Freq: Once | INTRAVENOUS | Status: AC
  Administered 2021-02-18: 12.5 g via INTRAVENOUS
  Filled 2021-02-18: qty 50

## 2021-02-18 MED ORDER — FENTANYL BOLUS VIA INFUSION
50.0000 ug | INTRAVENOUS | Status: DC | PRN
  Administered 2021-02-19: 100 ug via INTRAVENOUS
  Filled 2021-02-18: qty 100

## 2021-02-18 MED ORDER — FUROSEMIDE 10 MG/ML IJ SOLN
4.0000 mg/h | INTRAVENOUS | Status: DC
  Administered 2021-02-18 – 2021-02-20 (×2): 4 mg/h via INTRAVENOUS
  Filled 2021-02-18 (×2): qty 20

## 2021-02-18 NOTE — Procedures (Signed)
Endotracheal Intubation: Patient required placement of an artificial airway secondary to Respiratory Failure  Consent: From Mother Ebonique Hallstrom  Hand washing performed prior to starting the procedure.   Medications administered for sedation prior to procedure:  Midazolam 4 mg IV,  rocuronium 40 mg IV, Fentanyl 100 mcg IV.    A time out procedure was called and correct patient, name, & ID confirmed. Needed supplies and equipment were assembled and checked to include ETT, 10 ml syringe, Glidescope, Mac and Miller blades, suction, oxygen and bag mask valve, end tidal CO2 monitor.   Patient was positioned to align the mouth and pharynx to facilitate visualization of the glottis.   Heart rate, SpO2 and blood pressure was continuously monitored during the procedure. Pre-oxygenation was conducted prior to intubation and endotracheal tube was placed through the vocal cords into the trachea.     The artificial airway was placed under direct visualization via glidescope route using a 7.5  ETT on the first attempt.  ETT was secured at 23 cm mark.  Placement was confirmed by auscuitation of lungs with good breath sounds bilaterally and no stomach sounds.  Condensation was noted on endotracheal tube.   Pulse ox 98%.  CO2 detector in place with appropriate color change.   Complications: None .   Chest radiograph ordered and pending.   Comments: OGT placed via glidescope.   Ottie Glazier, M.D.  [facility] [place]

## 2021-02-18 NOTE — Progress Notes (Signed)
NAME:  Helen Patton, MRN:  [id number], DOB:  1960-10-25, LOS: 5 ADMISSION DATE:  02/26/2021, CONSULTATION DATE:  02/14/2021 REFERRING MD:  Sharion Settler, NP  CHIEF COMPLAINT: SOB   HPI  61 y.o  female with significant PMH of learning and intellectual disability, hypertension, seizures, cognitive decline, history of left lower breast cancer ER positive, who presents emergency department for chief concerns of a fall and imbalance.   02/16/21- patient is severe critically ill , I met with family and reviewed everything.  02/17/21-  extubated today  02/18/21- patient with severe pulm edema, at high risk of re-intubation.  S/p CXR and repeat ABG. S/p TTE.  Cardio consultation placed for new onset CHF  Past Medical History   Anxiety, Arthritis, Breast cancer ([place]) (2015), Cancer ([place]) (11/2013), Cataract, Depression, Difficulty swallowing, Falls, GERD (gastroesophageal reflux disease), Hidradenitis (2012), History of hiatal hernia, Hyperlipidemia, Hypertension, Mental retardation, MRSA (methicillin resistant staph aureus) culture positive (2012), Osteoporosis, Pneumonia, and Seizures ([place]).  Significant Hospital Events   1/3: Admitted to hospitalist service with acute hypoxic respiratory failure 1/4: Transferred to the ICU for worsening respiratory status requiring intubation.  PCCM consulted  Consults:  PCCM  Procedures:  1/4: Nicole intubation  Significant Diagnostic Tests:  1/4: Chest Xray>Marked progression of bilateral airspace disease consistent with worsening infection or edema. 2. Small bilateral pleural effusions, increased since prior study.  Micro Data:  1/4: SARS-CoV-2 PCR> negative 1/4: Influenza PCR> negative 1/4: Blood culture x2> 1/4: Urine Culture> 1/4: MRSA PCR>>  1/4: Strep pneumo urinary antigen> 1/4: Legionella urinary antigen>  Antimicrobials:   Azithromycin 1/4> Ceftriaxone 1/4>x1 Metronidazole 1/4>x1 Vancomycin 1/5> Cefepime 1/5>  OBJECTIVE  Blood  pressure (!) 131/57, pulse 81, temperature (!) 97.5 F (36.4 C), resp. rate (!) 23, height 4' 11.02" (1.499 m), weight 49.9 kg, SpO2 92 %. CVP:  [0 mmHg-12 mmHg] 10 mmHg  FiO2 (%):  [100 %] 100 %   Intake/Output Summary (Last 24 hours) at 02/18/2021 1041 Last data filed at 02/18/2021 0900 Gross per 24 hour  Intake 839.81 ml  Output 3705 ml  Net -2865.19 ml    Filed Weights   02/14/21 2019  Weight: 49.9 kg   Physical Examination  GENERAL: 61 year-old  EYES: Pupils equal, round, reactive to light and accommodation. No scleral icterus. Extraocular muscles intact.  HEENT: Head atraumatic, normocephalic. Oropharynx and nasopharynx clear.  NECK:  Supple, no jugular venous distention. No thyroid enlargement, no tenderness.  LUNGS: Decreased breath sounds bilaterally, no wheezing, Moderate rales and rhonchi. No use of accessory muscles of respiration.  CARDIOVASCULAR: S1, S2 normal. No murmurs, rubs, or gallops.  ABDOMEN: Soft, nontender, nondistended. Bowel sounds present. No organomegaly or mass.  EXTREMITIES: No pedal edema, cyanosis, or clubbing.  NEUROLOGIC: Cranial nerves II through XII are intact.  Muscle strength not checked. Sensation intact. Gait not checked.  PSYCHIATRIC: The patient is intubated and sedated SKIN: No obvious rash, lesion, or ulcer.   Labs/imaging that I havepersonally reviewed  (right click and "Reselect all SmartList Selections" daily)     Labs   CBC: Recent Labs  Lab 02/14/21 0614 02/15/21 0457 02/16/21 0545 02/17/21 0440 02/18/21 0332  WBC 13.7* 18.4* 18.5* 13.6* 8.6  NEUTROABS 10.0*  --   --   --   --   HGB 10.0* 8.7* 8.6* 8.7* 8.5*  HCT 31.4* 27.1* 27.3* 27.6* 27.2*  MCV 94.3 95.4 94.5 94.2 92.8  PLT 299 275 344 362 330     Basic Metabolic Panel: Recent Labs  Lab 02/15/21 0457 02/16/21 0545 02/17/21 0440 02/17/21 1422 02/17/21 2055 02/18/21 0332  NA 138 138 140 145 145 146*  K 2.9* 4.4 4.5 2.9* 3.5 4.6  CL 105 105 102 94* 97* 100   CO2 23 28 32 38* 36* 36*  GLUCOSE 448* 107* 146* 132* 173* 157*  BUN 18 24* 29* 30* 29* 30*  CREATININE 0.82 0.52 0.71 0.66 0.65 0.56  CALCIUM 7.1* 8.2* 8.9 9.4 8.6* 8.1*  MG 1.4* 2.7* 2.4 1.8  --  2.5*  PHOS 2.0* 3.6 3.4 3.7 3.1 2.6    GFR: Estimated Creatinine Clearance: 49.7 mL/min (by C-G formula based on SCr of 0.56 mg/dL). Recent Labs  Lab 03/11/2021 1704 02/12/2021 2245 02/14/21 0614 02/15/21 0457 02/16/21 0545 02/17/21 0440 02/18/21 0332  PROCALCITON  --  0.42 <0.10  --   --   --   --   WBC  --   --  13.7* 18.4* 18.5* 13.6* 8.6  LATICACIDVEN 1.9  --   --   --   --   --   --      Liver Function Tests: Recent Labs  Lab 02/15/21 1154 02/17/21 1422 02/17/21 2055  AST  --  16 18  ALT  --  13 12  ALKPHOS  --  76 58  BILITOT  --  1.0 1.1  PROT 5.8* 7.3 5.9*  ALBUMIN  --  3.5 2.9*    No results for input(s): LIPASE, AMYLASE in the last 168 hours. No results for input(s): AMMONIA in the last 168 hours.  ABG    Component Value Date/Time   PHART 7.48 (H) 02/18/2021 0926   PCO2ART 53 (H) 02/18/2021 0926   PO2ART 51 (L) 02/18/2021 0926   HCO3 39.5 (H) 02/18/2021 0926   ACIDBASEDEF 0.9 02/14/2021 2216   O2SAT 88.4 02/18/2021 0926      Coagulation Profile: Recent Labs  Lab 02/14/21 0614  INR 1.0     Cardiac Enzymes: No results for input(s): CKTOTAL, CKMB, CKMBINDEX, TROPONINI in the last 168 hours.  HbA1C: Hgb A1c MFr Bld  Date/Time Value Ref Range Status  02/16/2021 05:45 AM 5.2 4.8 - 5.6 % Final    Comment:    (NOTE) Pre diabetes:          5.7%-6.4%  Diabetes:              >6.4%  Glycemic control for   <7.0% adults with diabetes   02/15/2021 04:57 AM 5.1 4.8 - 5.6 % Final    Comment:    (NOTE) Pre diabetes:          5.7%-6.4%  Diabetes:              >6.4%  Glycemic control for   <7.0% adults with diabetes     CBG: Recent Labs  Lab 02/17/21 1616 02/17/21 1921 02/17/21 2304 02/18/21 0328 02/18/21 0719  GLUCAP 127* 172* 122*  139* 118*     Review of Systems:   UNABLE TO OBTAIN PATIENT IS INTUBATED  Past Medical History  She,  has a past medical history of Anxiety, Arthritis, Breast cancer ([place]) (2015), Cancer ([place]) (11/2013), Cataract, Depression, Difficulty swallowing, Falls, GERD (gastroesophageal reflux disease), Hidradenitis (2012), History of hiatal hernia, Hyperlipidemia, Hypertension, Mental retardation, MRSA (methicillin resistant staph aureus) culture positive (2012), Osteoporosis, Pneumonia, and Seizures ([place]).   Surgical History    Past Surgical History:  Procedure Laterality Date   BREAST LUMPECTOMY Left 12/07/2013   T1b, NX, ER/PR positive, HER-2/neu not overexpressed, 7  mm.  Not candidate for chemotherapy/sentinel node/radiation based on mental retardation.   BREAST SURGERY Left 12/07/2013   T1b, Nx; ER+, PR+, her 2 not amplified. 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 She has never used smokeless tobacco. She reports that she does not drink alcohol and does not use drugs.   Family History   Her family history includes Arthritis in her mother; Autoimmune disease in her brother; Breast cancer in her maternal aunt; Cancer in her paternal grandmother; Cancer (age of onset: 30) in her maternal aunt; Diabetes in her father and mother; Hypertension in her maternal grandmother; Neuropathy in her mother; Parkinson's  disease in her father.   Allergies Allergies  Allergen Reactions   Sulfa Antibiotics Other (See Comments)    "Does not know"   Keflex [Cephalexin] Other (See Comments)    "Does not know" unknown     Home Medications  Prior to Admission medications   Medication Sig Start Date End Date Taking? Authorizing Provider  alendronate (FOSAMAX) 70 MG tablet TAKE 1 TABLET  BY MOUTH WEEKLY EARLY MORNING BEFORE FOOD/MEDS WITH WATER. DO NOT LIE DOWN FOR 30 MINUTES 09/03/18  Yes Jerrol Banana., MD  amLODipine (NORVASC) 10 MG tablet TAKE 1 TABLET BY MOUTH AT BEDTIME 09/03/18  Yes Jerrol Banana., MD  BENZOYL PEROXIDE 5 % external wash USE TOPICALLY ONCE DAILY 10/27/19  Yes Jerrol Banana., MD  calcium-vitamin D (OSCAL WITH D) 500-200 MG-UNIT TABS tablet TAKE 1 TABLET BY MOUTH EVERY DAY WITH BREAKFAST 11/03/18  Yes Jerrol Banana., MD  cetirizine (ZYRTEC) 10 MG tablet Take 1 tablet (10 mg total) by mouth daily. 12/23/18  Yes Jerrol Banana., MD  chlorhexidine (PERIDEX) 0.12 % solution Use as directed 15 mLs in the mouth or throat 2 (two) times daily.   Yes [provider]  cyanocobalamin (,VITAMIN B-12,) 1000 MCG/ML injection INJECT 1ML=1000MCG INTRAMUSCULARLY EVERY MONTH (B-12 SUPPLEMENT) 10/02/20  Yes Jerrol Banana., MD  docusate sodium (COLACE) 100 MG capsule TAKE 1 CAPSULE BY MOUTH 2 TIMES PER DAY 09/03/18  Yes Jerrol Banana., MD  DULoxetine (CYMBALTA) 30 MG capsule Take 30 mg by mouth daily.   Yes [provider]  DULoxetine (CYMBALTA) 60 MG capsule Take 1 capsule (60 mg total) by mouth daily. 04/10/18  Yes Jerrol Banana., MD  ipratropium (ATROVENT) 0.03 % nasal spray Place 2 sprays into both nostrils 3 (three) times daily. 02/24/18  Yes Bacigalupo, [name], MD  lamoTRIgine (LAMICTAL) 150 MG tablet TAKE 2 TABLETS BY MOUTH 2 TIMES PER DAY 09/03/18  Yes Jerrol Banana., MD  LORazepam (ATIVAN) 1 MG tablet Take 1 tablet by mouth  every morning, and 1/2 tablet at noon and bedtime. Patient taking differently: Take 0.5-1 mg by mouth every 8 (eight) hours  as needed for anxiety or sleep. Take 1 mg (one tablet) by mouth every morning and 0.5 mg (one-half tablet) at noon and at bedtime 12/03/18  Yes Jerrol Banana., MD  losartan (COZAAR) 50 MG tablet TAKE 2 TABLETS (100 MG) BY MOUTH ONCE DAILY 04/11/20  Yes Jerrol Banana., MD  pantoprazole (PROTONIX) 40 MG tablet TAKE 1 TABLET BY MOUTH 2 TIMES PER DAY **DO NOT CRUSH** 12/23/18  Yes Jerrol Banana., MD  senna (SENOKOT) 8.6 MG TABS tablet Take 1 tablet (8.6 mg total) by mouth 2 (two) times daily. 03/01/19  Yes Lavina Hamman, MD  acetaminophen (TYLENOL) 325 MG tablet Take 2 tablets (650 mg total) by mouth every 6 (six) hours as needed. 07/14/17   Jerrol Banana., MD  BD ECLIPSE SYRINGE 25G X 1" 3 ML MISC FOR USE WITH B12 10/02/20   Jerrol Banana., MD  clotrimazole (LOTRIMIN) 1 % cream APPLY TOPICALLY TWICE A DAY AS NEEDED FOR RASH IN SKIN FOLDS 02/05/17   Jerrol Banana., MD  diphenhydrAMINE (BENADRYL) 12.5 MG chewable tablet Chew 1 tablet (12.5 mg total) by mouth 3 (three) times daily. 04/02/18   Jerrol Banana., MD  guaiFENesin-dextromethorphan [facility] DM) 100-10 MG/5ML syrup Take 5 mLs by mouth every 4 (four) hours as needed for cough. 02/07/21   Gwyneth Sprout, FNP  letrozole [facility]) 2.5 MG tablet TAKE 1 TABLET BY MOUTH ONCE DAILY *HAZARDOUS DRUG: WEAR GLOVES* Patient not taking: Reported on 10/17/2020 09/25/19   Jerrol Banana., MD  levETIRAcetam (KEPPRA) 250 MG tablet Take 1 tablet (250 mg total) by mouth 2 (two) times daily for 2 days. Then stop. Patient not taking: No sig reported 03/01/19 03/03/19  Lavina Hamman, MD  LORazepam (ATIVAN) 0.5 MG tablet Take 1.5 mg by mouth as directed. Take 1.5 mg (three tablets) by mouth one hour prior to dental appointment    [provider]  ondansetron (ZOFRAN ODT) 4 MG  disintegrating tablet Take 1 tablet (4 mg total) by mouth every 8 (eight) hours as needed for nausea or vomiting. 06/06/20   Brunetta Jeans, PA-C  sodium chloride (OCEAN) 0.65 % SOLN nasal spray Place 1 spray into both nostrils as needed for congestion. 02/07/21   Gwyneth Sprout, FNP  terbinafine (LAMISIL) 1 % cream USE 2 TIMES A WEEK AS NEEDED 01/22/21   Birdie Sons, MD  THERAPEUTIC SHAMPOO 0.5 % shampoo APPLY TOPICALLY AT BEDTIME AS NEEDED 11/16/20   Jerrol Banana., MD  triamcinolone (KENALOG) 0.025 % ointment APPLY 1 APPLICATION TOPICALLY TWICE A DAY AS NEEDED 01/22/21   Birdie Sons, MD    Scheduled Meds:  chlorhexidine gluconate (MEDLINE KIT)  15 mL Mouth Rinse BID   Chlorhexidine Gluconate Cloth  6 each Topical Daily   DULoxetine  30 mg Oral Daily   DULoxetine  60 mg Oral Daily   enoxaparin (LOVENOX) injection  40 mg Subcutaneous Q24H   guaiFENesin  600 mg Oral BID   hydrocortisone sod succinate (SOLU-CORTEF) inj  50 mg Intravenous Q12H   insulin aspart  0-20 Units Subcutaneous Q4H   ipratropium-albuterol  3 mL Nebulization Q6H   lamoTRIgine  300 mg Per Tube BID   LORazepam       mouth rinse  15 mL Mouth Rinse 10 times per day   midodrine  2.5 mg Per Tube TID WC   multivitamin  15 mL Per Tube Daily   pantoprazole (PROTONIX)  IV  40 mg Intravenous Q24H   Continuous Infusions:  sodium chloride Stopped (02/17/21 1558)   albumin human     dexmedetomidine (PRECEDEX) IV infusion 0.5 mcg/kg/hr (02/18/21 1000)   furosemide (LASIX) 200 mg in dextrose 5% 100 mL (41m/mL) infusion     levETIRAcetam 400 mL/hr at 02/18/21 1000   norepinephrine (LEVOPHED) Adult infusion 2 mcg/min (02/18/21 1013)   norepinephrine     vasopressin 0.03 Units/min (02/18/21 1022)   PRN Meds:.acetaminophen (TYLENOL) oral liquid 160 mg/5 mL **OR** acetaminophen, ipratropium-albuterol, ondansetron **OR** ondansetron (ZOFRAN) IV  Assessment & Plan:    Acute Hypoxic / Hypercapnic Respiratory due  to acute decompensated SYSTOLIC CHF  NEW ONSET CHF  -s/p lasix with substantial improvement - cardio consultation - appreciate input- DR CAMNITZ Component Ref Range & Units 1 d ago 3 d ago  B Natriuretic Peptide 0.0 - 100.0 pg/mL 1,512.9 High     TTE - 02/15/21 - DR GRockey Situ- (Severe hypokinesis of the  apical and large peri-apical region  involving distal anterior/inferior/septal/lateral walls).  LVEF 40-45% --Suspect exacerbation due to UTI sepsis    2. Sepsis with  septic shock-PRESENT ON ADMISSION       -DUE TO UTI - ECOLI >100, 0000 COLONIES -BLOOD CULTURE NEGATIVE, RESP/TRACHEAL ASPRIATE CULTURE NEGATIVE, VIRAL PANEL NEGATIVE  PMHx: MRSA (methicillin resistant staph aureus) culture positive (2012), recurrent pneumonia -Pressors for MAP goal >65 -Strict I/O's  3. Seizure disorder -On lamotrigine 300 mg p.o. twice daily -PRN Lorazepam for breakthrough seizures -Seizure precaution- IV keppra while on bipap/ventilator  Anxiety and Depression - Continue Lorazepam and Cymbalta  Best practice:  Diet:  NPO Pain/Anxiety/Delirium protocol (if indicated): Yes (RASS goal 0) VAP protocol (if indicated): Yes DVT prophylaxis: LMWH GI prophylaxis: PPI Glucose control:  SSI Yes Central venous access:  N/A Arterial line:  N/A Foley:  Yes, and it is still needed Mobility:  bed rest  PT consulted: N/A Last date of multidisciplinary goals of care discussion [1/4] Code Status:  full code Disposition: ICU   = Goals of Care = Code Status Order: FULL  Primary Emergency Contact: SMoody Home Phone: [phone] to pursue full aggressive treatment and intervention options, including CPR and intubation, but goals of care will be addressed on going with family if that should become necessary.   Critical care provider statement:   Total critical care time: 33 minutes   Performed by: ALanney GinsMD   Critical care time was exclusive of separately billable procedures  and treating other patients.   Critical care was necessary to treat or prevent imminent or life-threatening deterioration.   Critical care was time spent personally by me on the following activities: development of treatment plan with patient and/or surrogate as well as nursing, discussions with consultants, evaluation of patient's response to treatment, examination of patient, obtaining history from patient or surrogate, ordering and performing treatments and interventions, ordering and review of laboratory studies, ordering and review of radiographic studies, pulse oximetry and re-evaluation of patient's condition.    FOttie Glazier M.D.  Pulmonary & Critical Care Medicine      .

## 2021-02-18 NOTE — Progress Notes (Signed)
Hypotension in setting of precidex: ok to give 250 LR BOLUS

## 2021-02-18 NOTE — Consult Note (Signed)
Cardiology Consultation:   Patient ID: Helen Patton MRN: [id number]; DOB: 02-04-1961  Admit date: 03/13/2021 Date of Consult: 02/18/2021  PCP:  Jerrol Banana., MD   [facility] Providers Cardiologist:  None        Patient Profile:   Helen Patton is a 61 y.o. female with a hx of intellectual disability, hypertension, seizures, breast cancer who is being seen 02/18/2021 for the evaluation of heart failure at the request of Zetta Bills.  History of Present Illness:   Ms. Ord initially presented to the emergency room on 03/13/2021 with falls and imbalance.  On arrival to the emergency room, she was afebrile, but her blood pressure was 84/40.  Oxygen saturations were 95% on 4 L.  In the emergency room, she was noted to have worsening hypoxia and increased oxygen requirement.  She required BiPAP but had increased work of breathing and thus was intubated.  She was found to be in septic shock due to E. coli UTI.  She had a transthoracic echo that showed an ejection fraction of 40 to 45% with apical and periapical hypokinesis involving the anterior, inferior, septal, lateral walls.  BNP was checked which was found to be 1512.9.  Chest x-ray showed pulmonary edema.  She continued to have worsening respiratory failure throughout the day today.  She required repeat intubation.   Past Medical History:  Diagnosis Date   Anxiety    Arthritis    osteoarthritis   Breast cancer ([place]) 2015   [name]   Cancer ([place]) 11/2013   T1c, Nx; ER/ PR +; Her 2 neg invasive mammary carcinoma left breast. Wide excision only based on Tumor Board review.    Cataract    Depression    Difficulty swallowing    Falls    GERD (gastroesophageal reflux disease)    Hidradenitis 2012   MRSA, on chronic topical suppression   History of hiatal hernia    Hyperlipidemia    Hypertension    Mental retardation    MRSA (methicillin resistant staph aureus) culture positive 2012    Osteoporosis    Pneumonia    aspiration pneumonia   Seizures ([place])    last seizure February 2017    Past Surgical History:  Procedure Laterality Date   BREAST LUMPECTOMY Left 12/07/2013   T1b, NX, ER/PR positive, HER-2/neu not overexpressed, 7 mm.  Not candidate for chemotherapy/sentinel node/radiation based on mental retardation.   BREAST SURGERY Left 12/07/2013   T1b, Nx; ER+, PR+, her 2 not amplified. 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 HIP  08/23/2015   Procedure: CONVERSION TO TOTAL HIP;  Surgeon: Dereck Leep, MD;  Location: [facility] ORS;  Service: Orthopedics;;   dislocated hip     HARDWARE REMOVAL  08/23/2015   Procedure: HARDWARE REMOVAL;  Surgeon: Dereck Leep, MD;  Location: [facility] ORS;  Service: Orthopedics;;   TOTAL HIP REVISION Right 08/23/2015   Procedure: TOTAL HIP REVISION;  Surgeon: Dereck Leep, MD;  Location: [facility] ORS;  Service: Orthopedics;  Laterality: Right;     Home Medications:  Prior to Admission medications   Medication Sig Start Date End Date Taking? Authorizing Provider  alendronate (FOSAMAX) 70 MG tablet TAKE 1 TABLET  BY MOUTH WEEKLY EARLY MORNING BEFORE FOOD/MEDS WITH WATER. DO NOT LIE DOWN FOR 30 MINUTES 09/03/18  Yes Jerrol Banana., MD  amLODipine [facility]) 10  MG tablet TAKE 1 TABLET BY MOUTH AT BEDTIME 09/03/18  Yes Jerrol Banana., MD  BENZOYL PEROXIDE 5 % external wash USE TOPICALLY ONCE DAILY 10/27/19  Yes Jerrol Banana., MD  calcium-vitamin D (OSCAL WITH D) 500-200 MG-UNIT TABS tablet TAKE 1 TABLET BY MOUTH EVERY DAY WITH BREAKFAST 11/03/18  Yes Jerrol Banana., MD  cetirizine (ZYRTEC) 10 MG tablet Take 1 tablet (10 mg total) by mouth daily. 12/23/18  Yes Jerrol Banana., MD  chlorhexidine (PERIDEX) 0.12 % solution Use as directed 15 mLs in the mouth or throat 2 (two) times daily.   Yes [provider]   cyanocobalamin (,VITAMIN B-12,) 1000 MCG/ML injection INJECT 1ML=1000MCG INTRAMUSCULARLY EVERY MONTH (B-12 SUPPLEMENT) 10/02/20  Yes Jerrol Banana., MD  docusate sodium (COLACE) 100 MG capsule TAKE 1 CAPSULE BY MOUTH 2 TIMES PER DAY 09/03/18  Yes Jerrol Banana., MD  DULoxetine (CYMBALTA) 30 MG capsule Take 30 mg by mouth daily.   Yes [provider]  DULoxetine (CYMBALTA) 60 MG capsule Take 1 capsule (60 mg total) by mouth daily. 04/10/18  Yes Jerrol Banana., MD  ipratropium (ATROVENT) 0.03 % nasal spray Place 2 sprays into both nostrils 3 (three) times daily. 02/24/18  Yes Bacigalupo, [name], MD  lamoTRIgine (LAMICTAL) 150 MG tablet TAKE 2 TABLETS BY MOUTH 2 TIMES PER DAY 09/03/18  Yes Jerrol Banana., MD  LORazepam (ATIVAN) 1 MG tablet Take 1 tablet by mouth every morning, and 1/2 tablet at noon and bedtime. Patient taking differently: Take 0.5-1 mg by mouth every 8 (eight) hours as needed for anxiety or sleep. Take 1 mg (one tablet) by mouth every morning and 0.5 mg (one-half tablet) at noon and at bedtime 12/03/18  Yes Jerrol Banana., MD  losartan (COZAAR) 50 MG tablet TAKE 2 TABLETS (100 MG) BY MOUTH ONCE DAILY 04/11/20  Yes Jerrol Banana., MD  pantoprazole (PROTONIX) 40 MG tablet TAKE 1 TABLET BY MOUTH 2 TIMES PER DAY **DO NOT CRUSH** 12/23/18  Yes Jerrol Banana., MD  senna (SENOKOT) 8.6 MG TABS tablet Take 1 tablet (8.6 mg total) by mouth 2 (two) times daily. 03/01/19  Yes Lavina Hamman, MD  acetaminophen (TYLENOL) 325 MG tablet Take 2 tablets (650 mg total) by mouth every 6 (six) hours as needed. 07/14/17   Jerrol Banana., MD  BD ECLIPSE SYRINGE 25G X 1" 3 ML MISC FOR USE WITH B12 10/02/20   Jerrol Banana., MD  clotrimazole (LOTRIMIN) 1 % cream APPLY TOPICALLY TWICE A DAY AS NEEDED FOR RASH IN SKIN FOLDS 02/05/17   Jerrol Banana., MD  diphenhydrAMINE (BENADRYL) 12.5 MG chewable tablet Chew 1 tablet (12.5 mg  total) by mouth 3 (three) times daily. 04/02/18   Jerrol Banana., MD  guaiFENesin-dextromethorphan [facility] DM) 100-10 MG/5ML syrup Take 5 mLs by mouth every 4 (four) hours as needed for cough. 02/07/21   Gwyneth Sprout, FNP  letrozole [facility]) 2.5 MG tablet TAKE 1 TABLET BY MOUTH ONCE DAILY *HAZARDOUS DRUG: WEAR GLOVES* Patient not taking: Reported on 10/17/2020 09/25/19   Jerrol Banana., MD  levETIRAcetam (KEPPRA) 250 MG tablet Take 1 tablet (250 mg total) by mouth 2 (two) times daily for 2 days. Then stop. Patient not taking: No sig reported 03/01/19 03/03/19  Lavina Hamman, MD  LORazepam (ATIVAN) 0.5 MG tablet Take 1.5 mg by mouth as directed. Take 1.5  mg (three tablets) by mouth one hour prior to dental appointment    [provider]  ondansetron (ZOFRAN ODT) 4 MG disintegrating tablet Take 1 tablet (4 mg total) by mouth every 8 (eight) hours as needed for nausea or vomiting. 06/06/20   Brunetta Jeans, PA-C  sodium chloride (OCEAN) 0.65 % SOLN nasal spray Place 1 spray into both nostrils as needed for congestion. 02/07/21   Gwyneth Sprout, FNP  terbinafine (LAMISIL) 1 % cream USE 2 TIMES A WEEK AS NEEDED 01/22/21   Birdie Sons, MD  THERAPEUTIC SHAMPOO 0.5 % shampoo APPLY TOPICALLY AT BEDTIME AS NEEDED 11/16/20   Jerrol Banana., MD  triamcinolone (KENALOG) 0.025 % ointment APPLY 1 APPLICATION TOPICALLY TWICE A DAY AS NEEDED 01/22/21   Birdie Sons, MD    Inpatient Medications: Scheduled Meds:  chlorhexidine gluconate (MEDLINE KIT)  15 mL Mouth Rinse BID   Chlorhexidine Gluconate Cloth  6 each Topical Daily   DULoxetine  30 mg Oral Daily   DULoxetine  60 mg Oral Daily   enoxaparin (LOVENOX) injection  40 mg Subcutaneous Q24H   fentaNYL       guaiFENesin  600 mg Oral BID   hydrocortisone sod succinate (SOLU-CORTEF) inj  50 mg Intravenous Q12H   insulin aspart  0-20 Units Subcutaneous Q4H   ipratropium-albuterol  3 mL Nebulization Q6H   lamoTRIgine   300 mg Per Tube BID   LORazepam       mouth rinse  15 mL Mouth Rinse 10 times per day   midodrine  2.5 mg Per Tube TID WC   multivitamin  15 mL Per Tube Daily   pantoprazole (PROTONIX) IV  40 mg Intravenous Q24H   rocuronium bromide  40 mg Intravenous Once   Continuous Infusions:  sodium chloride Stopped (02/17/21 1558)   dexmedetomidine (PRECEDEX) IV infusion 0.5 mcg/kg/hr (02/18/21 1105)   fentaNYL     fentaNYL infusion INTRAVENOUS 100 mcg/hr (02/18/21 1158)   furosemide (LASIX) 200 mg in dextrose 5% 100 mL (44m/mL) infusion 4 mg/hr (02/18/21 1111)   levETIRAcetam 400 mL/hr at 02/18/21 1000   norepinephrine (LEVOPHED) Adult infusion 2 mcg/min (02/18/21 1013)   norepinephrine     vasopressin 0.03 Units/min (02/18/21 1022)   PRN Meds: acetaminophen (TYLENOL) oral liquid 160 mg/5 mL **OR** acetaminophen, ipratropium-albuterol, ondansetron **OR** ondansetron (ZOFRAN) IV  Allergies:    Allergies  Allergen Reactions   Sulfa Antibiotics Other (See Comments)    "Does not know"   Keflex [Cephalexin] Other (See Comments)    "Does not know" unknown    Social History:   Social History   Socioeconomic History   Marital status: Single    Spouse name: Not on file   Number of children: Not on file   Years of education: Not on file   Highest education level: Not on file  Occupational History   Not on file  Tobacco Use   Smoking status: Never   Smokeless tobacco: Never  Vaping Use   Vaping Use: Never used  Substance and Sexual Activity   Alcohol use: No   Drug use: No   Sexual activity: Never  Other Topics Concern   Not on file  Social History Narrative   Not on file   Social Determinants of Health   Financial Resource Strain: Not on file  Food Insecurity: Not on file  Transportation Needs: Not on file  Physical Activity: Not on file  Stress: Not on file  Social Connections: Not on  file  Intimate Partner Violence: Not on file    Family History:    Family History   Problem Relation Age of Onset   Diabetes Father    Parkinson's disease Father    Neuropathy Mother    Arthritis Mother    Diabetes Mother    Autoimmune disease Brother        AIDS   Cancer Maternal Aunt 72       breast   Breast cancer Maternal Aunt    Hypertension Maternal Grandmother    Cancer Paternal Grandmother      ROS:  Please see the history of present illness.   All other ROS reviewed and negative.     Physical Exam/Data:   Vitals:   02/18/21 1023 02/18/21 1025 02/18/21 1030 02/18/21 1045  BP: 117/66 138/67 (!) 131/57 (!) 112/56  Pulse: 73 75 81 71  Resp: 19 19 (!) 23 19  Temp:    98.9 F (37.2 C)  TempSrc:    Axillary  SpO2: 96% 96% 92% 92%  Weight:      Height:        Intake/Output Summary (Last 24 hours) at 02/18/2021 1203 Last data filed at 02/18/2021 1040 Gross per 24 hour  Intake 867.7 ml  Output 3455 ml  Net -2587.3 ml   Last 3 Weights 02/14/2021 02/20/2021 02/07/2021  Weight (lbs) 110 lb 0.2 oz 110 lb 0.2 oz 110 lb  Weight (kg) 49.9 kg 49.9 kg 49.896 kg     Body mass index is 22.21 kg/m.  General: Intubated, sedated HEENT: normal Neck: no JVD Vascular: No carotid bruits; Distal pulses 2+ bilaterally Cardiac:  normal S1, S2; RRR; no murmur  Lungs:  clear to auscultation bilaterally, no wheezing, rhonchi or rales  Abd: soft, nontender, no hepatomegaly  Ext: no edema Musculoskeletal:  No deformities, BUE and BLE strength normal and equal Skin: warm and dry  Neuro:  CNs 2-12 intact, no focal abnormalities noted Psych:  Normal affect   EKG:  The EKG was personally reviewed and demonstrates: Atrial flutter Telemetry:  Telemetry was personally reviewed and demonstrates: Sinus rhythm, intermittent atrial fibrillation/flutter  Relevant CV Studies: TTE 1/5/233  1. Left ventricular ejection fraction, by estimation, is 40 to 45%. The  left ventricle has mildly decreased function. The left ventricle  demonstrates regional wall motion abnormalities  (Severe hypokinesis of the  apical and large peri-apical region  involving distal anterior/inferior/septal/lateral walls). There is mild  left ventricular hypertrophy. The average left ventricular global  longitudinal strain is -13.3 %. The global longitudinal strain is  abnormal.   2. Right ventricular systolic function is normal. The right ventricular  size is normal.   3. The mitral valve is normal in structure. Mild mitral valve  regurgitation. No evidence of mitral stenosis.   4. The aortic valve is normal in structure. Aortic valve regurgitation is  mild. No aortic stenosis is present.   5. The inferior vena cava is normal in size with greater than 50%  respiratory variability, suggesting right atrial pressure of 3 mmHg.   Laboratory Data:  High Sensitivity Troponin:  No results for input(s): TROPONINIHS in the last 720 hours.   Chemistry Recent Labs  Lab 02/17/21 0440 02/17/21 1422 02/17/21 2055 02/18/21 0332  NA 140 145 145 146*  K 4.5 2.9* 3.5 4.6  CL 102 94* 97* 100  CO2 32 38* 36* 36*  GLUCOSE 146* 132* 173* 157*  BUN 29* 30* 29* 30*  CREATININE 0.71 0.66 0.65 0.56  CALCIUM 8.9 9.4 8.6* 8.1*  MG 2.4 1.8  --  2.5*  GFRNONAA >60 >60 >60 >60  ANIONGAP 6 13 12 10     Recent Labs  Lab 02/15/21 1154 02/17/21 1422 02/17/21 2055  PROT 5.8* 7.3 5.9*  ALBUMIN  --  3.5 2.9*  AST  --  16 18  ALT  --  13 12  ALKPHOS  --  76 58  BILITOT  --  1.0 1.1   Lipids  Recent Labs  Lab 02/16/21 0545  TRIG 51    Hematology Recent Labs  Lab 02/16/21 0545 02/17/21 0440 02/18/21 0332  WBC 18.5* 13.6* 8.6  RBC 2.89* 2.93* 2.93*  HGB 8.6* 8.7* 8.5*  HCT 27.3* 27.6* 27.2*  MCV 94.5 94.2 92.8  MCH 29.8 29.7 29.0  MCHC 31.5 31.5 31.3  RDW 14.4 14.4 14.2  PLT 344 362 330   Thyroid No results for input(s): TSH, FREET4 in the last 168 hours.  BNP Recent Labs  Lab 02/15/21 0457 02/17/21 0440  BNP 963.0* 1,512.9*    DDimer  Recent Labs  Lab 02/15/21 0450  DDIMER  2.25*     Radiology/Studies:  DG Chest 1 View  Result Date: 02/14/2021 CLINICAL DATA:  Hypoxia EXAM: CHEST  1 VIEW COMPARISON:  03/08/2021 FINDINGS: Single frontal view of the chest demonstrates a stable cardiac silhouette. There is marked progression of bilateral perihilar airspace disease, with likely development of small bilateral pleural effusions. No pneumothorax. No acute bony abnormalities. IMPRESSION: 1. Marked progression of bilateral airspace disease consistent with worsening infection or edema. 2. Small bilateral pleural effusions, increased since prior study. Electronically Signed   By: Randa Ngo M.D.   On: 02/14/2021 21:12   DG Abd 1 View  Result Date: 02/18/2021 CLINICAL DATA:  Check for aerophagia. Patient is placed on bipap and is complaining of abdominal pain and bloating. EXAM: ABDOMEN - 1 VIEW COMPARISON:  02/14/2021.  CT, 02/26/2019. FINDINGS: Paucity of bowel gas. No significant gastric air. No evidence of bowel dilation or gastric distension. Soft tissues unremarkable. No acute skeletal abnormality. IMPRESSION: 1. No acute findings. No evidence of bowel obstruction. No bowel or gastric distention. Electronically Signed   By: Lajean Manes M.D.   On: 02/18/2021 10:28   DG Abd 1 View  Result Date: 02/15/2021 CLINICAL DATA:  Intubation. EXAM: PORTABLE CHEST 1 VIEW COMPARISON:  Chest radiograph dated 02/14/2021. FINDINGS: Endotracheal tube with tip approximately 4.5 cm above the carina. Enteric tube with tip in the body of the stomach. Bilateral pulmonary opacities as seen previously. No pneumothorax. Stable cardiomediastinal silhouette. No acute osseous pathology. IMPRESSION: 1. Endotracheal tube with tip above the carina. Enteric tube with tip in the body of the stomach. 2. Bilateral pulmonary opacities as seen previously. Electronically Signed   By: Anner Crete M.D.   On: 02/15/2021 00:02   CT Angio Chest Pulmonary Embolism (PE) W or WO Contrast  Result Date:  02/15/2021 CLINICAL DATA:  Worsening O2 sats, sepsis, pneumonia. EXAM: CT ANGIOGRAPHY CHEST WITH CONTRAST TECHNIQUE: Multidetector CT imaging of the chest was performed using the standard protocol during bolus administration of intravenous contrast. Multiplanar CT image reconstructions and MIPs were obtained to evaluate the vascular anatomy. CONTRAST:  106m OMNIPAQUE IOHEXOL 350 MG/ML SOLN COMPARISON:  Same-day chest radiograph FINDINGS: Lines/tubes: The endotracheal tube terminates in the midthoracic trachea. The enteric catheter tip is off the field of view. Cardiovascular: There is adequate opacification of the pulmonary arteries to the segmental level. There is no evidence of pulmonary  embolism. The heart is not enlarged. There is no pericardial effusion. Scattered coronary artery calcifications are noted. There is mild calcified atherosclerotic plaque of the aortic arch. The thoracic aorta is otherwise unremarkable. Mediastinum/Nodes: Thyroid is grossly unremarkable. Esophagus is grossly unremarkable. A calcified subcarinal lymph node is noted. Calcified mediastinal and hilar lymph nodes are noted consistent with prior granulomatous disease. There is no axillary lymphadenopathy. Lungs/Pleura: The trachea and main bronchi are patent. There are moderate-sized bilateral pleural effusions with consolidation throughout much of the bilateral lower lobes, left worse than right. Scattered additional patchy opacities are also seen in the bilateral upper lobes, right middle lobe, and lingula. There is no pneumothorax. Upper Abdomen: Bilateral renal cysts are noted. Musculoskeletal: There is no acute osseous abnormality or aggressive osseous lesion. Multiple remote rib fractures are noted. Mild compression deformity of the T4 through T7 vertebral bodies is similar to the prior CT from 2017. Review of the MIP images confirms the above findings. IMPRESSION: 1. No evidence of pulmonary embolism. 2. Near-complete consolidation  in the bilateral lower lobes with scattered additional patchy opacities throughout the remainder of the lungs and moderate bilateral pleural effusions consistent with multifocal pneumonia. Aortic Atherosclerosis (ICD10-I70.0). Electronically Signed   By: Valetta Mole M.D.   On: 02/15/2021 09:16   [place] CHEST (PLEURAL EFFUSION)  Result Date: 02/15/2021 CLINICAL DATA:  Evaluate pleural fluid EXAM: CHEST ULTRASOUND COMPARISON:  None. FINDINGS: Limited sonographic exam of the bilateral chest was performed for fluids survey. Images demonstrate trace bilateral pleural effusions. Fluid volume is insufficient for safe image guided thoracentesis. No thoracentesis performed. IMPRESSION: Trace bilateral pleural effusions, insufficient for safe image guided thoracentesis. No thoracentesis performed. Electronically Signed   By: Albin Felling M.D.   On: 02/15/2021 12:52   DG Chest Port 1 View  Result Date: 02/18/2021 CLINICAL DATA:  Shortness of breath.  Breast cancer. EXAM: PORTABLE CHEST 1 VIEW COMPARISON:  02/18/2021 FINDINGS: Diffuse bilateral airspace disease is similar to prior. Cardiopericardial silhouette is at upper limits of normal for size. Right IJ central line tip overlies the mid to distal SVC. Telemetry leads overlie the chest. IMPRESSION: Stable exam. Diffuse bilateral airspace disease compatible with diffuse infection. Asymmetric edema is a less likely consideration by imaging. Electronically Signed   By: Misty Stanley M.D.   On: 02/18/2021 09:46   DG Chest Port 1 View  Result Date: 02/18/2021 CLINICAL DATA:  Hypoxia. Patient laying supine and sideways in bed and unable to be moved. EXAM: PORTABLE CHEST 1 VIEW COMPARISON:  Prior chest x-ray yesterday 02/17/2021 FINDINGS: Stable position of right IJ central venous catheter with catheter tip in the mid SVC. Similar appearance of the lungs with diffuse bilateral interstitial airspace opacities more confluent in the bilateral upper lungs most consistent with  multifocal pneumonia. Calcified mediastinal lymph nodes again noted. Trace calcifications in the thoracic aorta. The heart is normal in size. No pneumothorax. No acute osseous abnormality. IMPRESSION: Persistent extensive bilateral interstitial and patchy airspace opacities most confluent in the upper lungs concerning for multifocal pneumonia. Stable position of right IJ central venous catheter. Electronically Signed   By: Jacqulynn Cadet M.D.   On: 02/18/2021 06:38   DG Chest Port 1 View  Result Date: 02/17/2021 CLINICAL DATA:  Sepsis history of breast cancer EXAM: PORTABLE CHEST 1 VIEW COMPARISON:  02/16/2021, CT chest 02/15/2021, radiographs 02/14/2021, 02/22/2021 FINDINGS: Interval extubation and removal of esophageal tube. Right IJ central venous catheter tip over the SVC. Extensive bilateral airspace disease with some improvement in aeration  at the right base. Stable cardiomediastinal silhouette with aortic atherosclerosis. Small calcified mediastinal lymph nodes and hilar nodes. No pneumothorax. IMPRESSION: 1. Interval extubation and removal of esophageal tube. 2. Extensive bilateral airspace disease with some improvement in aeration of right lower lobe. Airspace disease otherwise without significant change Electronically Signed   By: Donavan Foil M.D.   On: 02/17/2021 17:40   DG Chest Port 1 View  Result Date: 02/16/2021 CLINICAL DATA:  Central line placement. EXAM: PORTABLE CHEST 1 VIEW COMPARISON:  Chest radiograph 02/16/2021 at 4:35 a.m. FINDINGS: A new right jugular catheter terminates over the lower SVC. An endotracheal tube remains in place, terminating 2.5 cm above the carina. An enteric tube courses into the abdomen with tip not imaged. The cardiomediastinal silhouette is unchanged with normal heart size. Widespread airspace opacities throughout both lungs have not significantly changed. There are persistent bilateral pleural effusions, moderate in size by yesterday's CTA. No pneumothorax is  identified. IMPRESSION: 1. New right jugular catheter terminating over the lower SVC. 2. Unchanged extensive bilateral airspace disease and bilateral pleural effusions. Electronically Signed   By: Logan Bores M.D.   On: 02/16/2021 12:47   DG Chest Port 1 View  Result Date: 02/16/2021 CLINICAL DATA:  Shortness of breath EXAM: PORTABLE CHEST 1 VIEW COMPARISON:  Previous studies including the examination of 02/14/2021. FINDINGS: Tip of endotracheal tube is 2.5 cm above the carina. Enteric tube is noted traversing the esophagus. Transverse diameter of heart is increased. Diffuse alveolar densities seen in both lungs more so on the left side with interval worsening. There is blunting of both lateral CP angles. There is no definite demonstrable pneumothorax. IMPRESSION: Extensive infiltrates are seen in both lungs with interval worsening suggesting multifocal pneumonia. Part of this finding may suggest pulmonary edema. Bilateral pleural effusions. Electronically Signed   By: Elmer Picker M.D.   On: 02/16/2021 08:19   DG Chest Port 1 View  Result Date: 02/15/2021 CLINICAL DATA:  Intubation. EXAM: PORTABLE CHEST 1 VIEW COMPARISON:  Chest radiograph dated 02/14/2021. FINDINGS: Endotracheal tube with tip approximately 4.5 cm above the carina. Enteric tube with tip in the body of the stomach. Bilateral pulmonary opacities as seen previously. No pneumothorax. Stable cardiomediastinal silhouette. No acute osseous pathology. IMPRESSION: 1. Endotracheal tube with tip above the carina. Enteric tube with tip in the body of the stomach. 2. Bilateral pulmonary opacities as seen previously. Electronically Signed   By: Anner Crete M.D.   On: 02/15/2021 00:02   ECHOCARDIOGRAM COMPLETE  Result Date: 02/15/2021    ECHOCARDIOGRAM REPORT   Patient Name:   Helen Patton Date of Exam: 02/15/2021 Medical Rec #:  [id number]          Height:       59.0 in Accession #:    [id number]         Weight:       110.0 lb Date of  Birth:  [birth year]          BSA:          1.431 m Patient Age:    3 years           BP:           97/46 mmHg Patient Gender: F                  HR:           87 bpm. Exam Location:  [facility] Procedure: 2D Echo, Color Doppler and Cardiac Doppler Indications:  I50.21 congestive heart failure-Acute Systolic  History:         Patient has prior history of Echocardiogram examinations. Risk                  Factors:Hypertension and Dyslipidemia.  Sonographer:     Charmayne Sheer Referring Phys:  [id number] Bradly Bienenstock Diagnosing Phys: Ida Rogue MD  Sonographer Comments: Echo performed with patient supine and on artificial respirator. IMPRESSIONS  1. Left ventricular ejection fraction, by estimation, is 40 to 45%. The left ventricle has mildly decreased function. The left ventricle demonstrates regional wall motion abnormalities (Severe hypokinesis of the apical and large peri-apical region involving distal anterior/inferior/septal/lateral walls). There is mild left ventricular hypertrophy. The average left ventricular global longitudinal strain is -13.3 %. The global longitudinal strain is abnormal.  2. Right ventricular systolic function is normal. The right ventricular size is normal.  3. The mitral valve is normal in structure. Mild mitral valve regurgitation. No evidence of mitral stenosis.  4. The aortic valve is normal in structure. Aortic valve regurgitation is mild. No aortic stenosis is present.  5. The inferior vena cava is normal in size with greater than 50% respiratory variability, suggesting right atrial pressure of 3 mmHg. FINDINGS  Left Ventricle: Left ventricular ejection fraction, by estimation, is 40 to 45%. The left ventricle has mildly decreased function. The left ventricle demonstrates regional wall motion abnormalities. The average left ventricular global longitudinal strain is -13.3 %. The global longitudinal strain is abnormal. The left ventricular internal cavity size was normal in size. There  is mild left ventricular hypertrophy. Left ventricular diastolic parameters are indeterminate. Right Ventricle: The right ventricular size is normal. No increase in right ventricular wall thickness. Right ventricular systolic function is normal. Left Atrium: Left atrial size was normal in size. Right Atrium: Right atrial size was normal in size. Pericardium: There is no evidence of pericardial effusion. Mitral Valve: The mitral valve is normal in structure. Mild mitral valve regurgitation. No evidence of mitral valve stenosis. MV peak gradient, 13.0 mmHg. The mean mitral valve gradient is 4.0 mmHg. Tricuspid Valve: The tricuspid valve is normal in structure. Tricuspid valve regurgitation is mild . No evidence of tricuspid stenosis. Aortic Valve: The aortic valve is normal in structure. Aortic valve regurgitation is mild. No aortic stenosis is present. Aortic valve mean gradient measures 12.0 mmHg. Aortic valve peak gradient measures 21.7 mmHg. Aortic valve area, by VTI measures 1.76 cm. Pulmonic Valve: The pulmonic valve was normal in structure. Pulmonic valve regurgitation is not visualized. No evidence of pulmonic stenosis. Aorta: The aortic root is normal in size and structure. Venous: The inferior vena cava is normal in size with greater than 50% respiratory variability, suggesting right atrial pressure of 3 mmHg. IAS/Shunts: No atrial level shunt detected by color flow Doppler.  LEFT VENTRICLE PLAX 2D LVIDd:         3.38 cm   Diastology LVIDs:         2.31 cm   LV e' medial:    6.96 cm/s LV PW:         0.80 cm   LV E/e' medial:  17.6 LV IVS:        0.73 cm   LV e' lateral:   8.70 cm/s LVOT diam:     1.60 cm   LV E/e' lateral: 14.1 LV SV:         79 LV SV Index:   55        2D  Longitudinal Strain LVOT Area:     2.01 cm  2D Strain GLS Avg:     -13.3 %  RIGHT VENTRICLE RV Basal diam:  2.92 cm LEFT ATRIUM           Index        RIGHT ATRIUM           Index LA diam:      3.90 cm 2.73 cm/m   RA Area:     11.00 cm  LA Vol (A2C): 23.4 ml 16.36 ml/m  RA Volume:   23.60 ml  16.50 ml/m LA Vol (A4C): 50.8 ml 35.51 ml/m  AORTIC VALVE                     PULMONIC VALVE AV Area (Vmax):    1.62 cm      PV Vmax:       1.01 m/s AV Area (Vmean):   1.85 cm      PV Vmean:      73.100 cm/s AV Area (VTI):     1.76 cm      PV VTI:        0.192 m AV Vmax:           233.00 cm/s   PV Peak grad:  4.1 mmHg AV Vmean:          162.000 cm/s  PV Mean grad:  2.0 mmHg AV VTI:            0.450 m AV Peak Grad:      21.7 mmHg AV Mean Grad:      12.0 mmHg LVOT Vmax:         188.00 cm/s LVOT Vmean:        149.000 cm/s LVOT VTI:          0.393 m LVOT/AV VTI ratio: 0.87  AORTA Ao Root diam: 2.50 cm MITRAL VALVE                TRICUSPID VALVE MV Area (PHT): 3.11 cm     TR Peak grad:   37.7 mmHg MV Area VTI:   2.66 cm     TR Vmax:        307.00 cm/s MV Peak grad:  13.0 mmHg MV Mean grad:  4.0 mmHg     SHUNTS MV Vmax:       1.80 m/s     Systemic VTI:  0.39 m MV Vmean:      95.5 cm/s    Systemic Diam: 1.60 cm MV Decel Time: 244 msec MV E velocity: 122.33 cm/s Ida Rogue MD Electronically signed by Ida Rogue MD Signature Date/Time: 02/15/2021/6:47:38 PM    Final      Assessment and Plan:   Acute systolic heart failure: Potentially due to her current critical illness.  Depending on her recovery, she may need further work-up once she is extubated.  Ejection fraction 40 to 45%.  Her BNP is significantly elevated.  Chest x-ray shows volume overload.  Currently on Lasix drip.  She is net -2.5 L since admission.  Creatinine has remained stable.  Would continue with Lasix drip for now. Sepsis with septic shock due to E. coli UTI: Plan per primary team. Hypoxic respiratory failure: Unfortunately required repeat intubation today.  We Reona Zendejas continue with diuresis for acute heart failure.  Potentially multifactorial.    For questions or updates, please contact [place] Please consult [email or web address] for contact info under    Signed, Jayke Caul [employer]  Amna Welker, MD  02/18/2021 12:03 PM

## 2021-02-18 NOTE — Progress Notes (Signed)
SLP Cancellation Note  Patient Details Name: Helen Patton MRN: [id number] DOB: [birth year]   Cancelled treatment:       Reason Eval/Treat Not Completed: Patient not medically ready;Medical issues which prohibited therapy   Per chart review, pt now re-intubated. SLP to sign off at this time. Please reconsult, as appropriate.   RN made aware of the above.   Cherrie Gauze, M.S., [facility] [phone] [name])   [name] 02/18/2021, 12:45 PM

## 2021-02-18 NOTE — Consult Note (Signed)
PHARMACY CONSULT NOTE - FOLLOW UP  Pharmacy Consult for Electrolyte Monitoring and Replacement   Recent Labs: Potassium (mmol/L)  Date Value  02/18/2021 4.6  01/08/2013 4.0   Magnesium (mg/dL)  Date Value  02/18/2021 2.5 (H)   Calcium (mg/dL)  Date Value  02/18/2021 8.1 (L)   Calcium, Total (mg/dL)  Date Value  01/08/2013 9.6   Albumin (g/dL)  Date Value  02/17/2021 2.9 (L)  04/11/2020 4.3  01/08/2013 4.1   Phosphorus (mg/dL)  Date Value  02/18/2021 2.6   Sodium (mmol/L)  Date Value  02/18/2021 146 (H)  04/11/2020 141  01/08/2013 136     Assessment: 61yo female w/ h/o learning and intellectual disability, HTN, seizures, cognitive decline, history of left lower breast cancer ER positive, who presented to CC of fall and imbalance. Over next 24hrs progressive acute hypoxic respiratory failure req'ing intubation and admission to ICU. Pharmacy has been consulted for the mgmt of electrolytes.  Goal of Therapy:  Lytes WNL  Plan:  No replacement needed today.  F/u with AM labs.    Eleonore Chiquito, PharmD,  Clinical Pharmacist 02/18/2021 8:15 AM

## 2021-02-18 NOTE — Progress Notes (Signed)
GOALS OF CARE FAMILY CONFERENCE   Current clinical status, hospital findings and medical plan was reviewed with family.   Updated and notified of patients ongoing immediate critical medical problems.   Patient remains critically ill  Patient is unable to breathe independently, unable to protect airway and unable to mobilize secretions.    Explained to family course of therapy and  modalities    Family is appreciative of care and relate understanding that patient is severely critically ill with anticipation of passing away during this hospitalization.   They have consented and agreed to full code  Code status   Family are satisfied with Plan of action and management. All questions answered  Additional Critical Care time 35 mins    Helen Patton, M.D.  Pulmonary & [place]

## 2021-02-18 NOTE — Progress Notes (Addendum)
SLP Cancellation Note  Patient Details Name: Helen Patton MRN: [id number] DOB: [birth year]   Cancelled treatment:       Reason Eval/Treat Not Completed: Patient not medically ready;Medical issues which prohibited therapy.   SLP consult received and appreciated. Chart review completed. Spoke with RN, Levada Dy. Pt currently BiPap dependent. Will defer SLP efforts pending improvements in respiratory status.  RN aware and in agreement with the above.  Cherrie Gauze, M.S., [facility] [phone] [name])  [name] 02/18/2021, 8:44 AM

## 2021-02-19 DIAGNOSIS — J9601 Acute respiratory failure with hypoxia: Secondary | ICD-10-CM | POA: Diagnosis not present

## 2021-02-19 LAB — GLUCOSE, CAPILLARY
Glucose-Capillary: 233 mg/dL — ABNORMAL HIGH (ref 70–99)
Glucose-Capillary: 109 mg/dL — ABNORMAL HIGH (ref 70–99)
Glucose-Capillary: 196 mg/dL — ABNORMAL HIGH (ref 70–99)
Glucose-Capillary: 172 mg/dL — ABNORMAL HIGH (ref 70–99)
Glucose-Capillary: 162 mg/dL — ABNORMAL HIGH (ref 70–99)
Glucose-Capillary: 152 mg/dL — ABNORMAL HIGH (ref 70–99)

## 2021-02-19 LAB — CBC
HCT: 23.7 % — ABNORMAL LOW (ref 36.0–46.0)
Hemoglobin: 7.6 g/dL — ABNORMAL LOW (ref 12.0–15.0)
MCH: 29.3 pg (ref 26.0–34.0)
MCHC: 32.1 g/dL (ref 30.0–36.0)
MCV: 91.5 fL (ref 80.0–100.0)
Platelets: 278 10*3/uL (ref 150–400)
RBC: 2.59 MIL/uL — ABNORMAL LOW (ref 3.87–5.11)
RDW: 13.9 % (ref 11.5–15.5)
WBC: 10.5 10*3/uL (ref 4.0–10.5)
nRBC: 0.3 % — ABNORMAL HIGH (ref 0.0–0.2)

## 2021-02-19 LAB — PHOSPHORUS: Phosphorus: 2.4 mg/dL — ABNORMAL LOW (ref 2.5–4.6)

## 2021-02-19 LAB — BASIC METABOLIC PANEL
Anion gap: 11 (ref 5–15)
BUN: 28 mg/dL — ABNORMAL HIGH (ref 8–23)
CO2: 41 mmol/L — ABNORMAL HIGH (ref 22–32)
Calcium: 8.6 mg/dL — ABNORMAL LOW (ref 8.9–10.3)
Chloride: 91 mmol/L — ABNORMAL LOW (ref 98–111)
Creatinine, Ser: 0.78 mg/dL (ref 0.44–1.00)
GFR, Estimated: >60 mL/min (ref >60)
Glucose, Bld: 262 mg/dL — ABNORMAL HIGH (ref 70–99)
Potassium: 2.3 mmol/L — CL (ref 3.5–5.1)
Sodium: 143 mmol/L (ref 135–145)

## 2021-02-19 LAB — POTASSIUM: Potassium: 3 mmol/L — ABNORMAL LOW (ref 3.5–5.1)

## 2021-02-19 LAB — MAGNESIUM: Magnesium: 1.9 mg/dL (ref 1.7–2.4)

## 2021-02-19 MED ORDER — POTASSIUM CHLORIDE 10 MEQ/50ML IV SOLN
10.0000 meq | INTRAVENOUS | Status: AC
  Administered 2021-02-19 (×4): 10 meq via INTRAVENOUS
  Filled 2021-02-19 (×4): qty 50

## 2021-02-19 MED ORDER — FREE WATER
30.0000 mL | Status: DC
  Administered 2021-02-19 – 2021-02-20 (×7): 30 mL

## 2021-02-19 MED ORDER — VITAL AF 1.2 CAL PO LIQD
1000.0000 mL | ORAL | Status: DC
  Administered 2021-02-19: 1000 mL

## 2021-02-19 MED ORDER — PROSOURCE TF PO LIQD
45.0000 mL | Freq: Every day | ORAL | Status: DC
  Administered 2021-02-19 – 2021-02-20 (×2): 45 mL
  Filled 2021-02-19 (×2): qty 45

## 2021-02-19 MED ORDER — POTASSIUM CHLORIDE 20 MEQ PO PACK
40.0000 meq | PACK | ORAL | Status: AC
  Administered 2021-02-19 (×2): 40 meq
  Filled 2021-02-19 (×2): qty 2

## 2021-02-19 MED ORDER — MIDODRINE HCL 5 MG PO TABS
5.0000 mg | ORAL_TABLET | Freq: Three times a day (TID) | ORAL | Status: DC
  Administered 2021-02-19 – 2021-02-20 (×3): 5 mg
  Filled 2021-02-19 (×3): qty 1

## 2021-02-19 MED ORDER — POTASSIUM CHLORIDE 20 MEQ PO PACK
40.0000 meq | PACK | Freq: Once | ORAL | Status: AC
  Administered 2021-02-19: 40 meq
  Filled 2021-02-19: qty 2

## 2021-02-19 NOTE — Progress Notes (Signed)
Progress Note  Patient Name: Helen Patton Date of Encounter: 02/19/2021  [facility] Cardiologist: None new  Subjective   She is still intubated and sedated.  Inpatient Medications    Scheduled Meds:  chlorhexidine gluconate (MEDLINE KIT)  15 mL Mouth Rinse BID   Chlorhexidine Gluconate Cloth  6 each Topical Daily   docusate  100 mg Per Tube BID   DULoxetine  30 mg Oral Daily   DULoxetine  60 mg Oral Daily   enoxaparin (LOVENOX) injection  40 mg Subcutaneous Q24H   feeding supplement (PROSource TF)  45 mL Per Tube Daily   feeding supplement (VITAL AF 1.2 CAL)  1,000 mL Per Tube Q24H   free water  30 mL Per Tube Q4H   guaiFENesin  600 mg Oral BID   hydrocortisone sod succinate (SOLU-CORTEF) inj  50 mg Intravenous Q12H   insulin aspart  0-20 Units Subcutaneous Q4H   ipratropium-albuterol  3 mL Nebulization Q6H   lamoTRIgine  300 mg Per Tube BID   mouth rinse  15 mL Mouth Rinse 10 times per day   midodrine  5 mg Per Tube TID WC   multivitamin  15 mL Per Tube Daily   pantoprazole (PROTONIX) IV  40 mg Intravenous Q24H   polyethylene glycol  17 g Per Tube Daily   Continuous Infusions:  sodium chloride Stopped (02/17/21 1558)    ceFAZolin (ANCEF) IV 100 mL/hr at 02/19/21 1000   dexmedetomidine (PRECEDEX) IV infusion 1.2 mcg/kg/hr (02/19/21 1000)   fentaNYL infusion INTRAVENOUS 325 mcg/hr (02/19/21 1000)   furosemide (LASIX) 200 mg in dextrose 5% 100 mL (33m/mL) infusion 4 mg/hr (02/19/21 1000)   levETIRAcetam Stopped (02/19/21 0839)   norepinephrine (LEVOPHED) Adult infusion Stopped (02/18/21 1424)   vasopressin 0.03 Units/min (02/19/21 1029)   PRN Meds: acetaminophen (TYLENOL) oral liquid 160 mg/5 mL **OR** acetaminophen, fentaNYL, ipratropium-albuterol, midazolam, ondansetron **OR** ondansetron (ZOFRAN) IV   Vital Signs    Vitals:   02/19/21 0900 02/19/21 0920 02/19/21 0940 02/19/21 1000  BP: (!) 117/59 108/65 (!) 106/54 (!) 92/49  Pulse: 84 85 79 78  Resp:  18 19 19 20   Temp:      TempSrc:      SpO2: 100% 99% 96% 98%  Weight:      Height:        Intake/Output Summary (Last 24 hours) at 02/19/2021 1151 Last data filed at 02/19/2021 1000 Gross per 24 hour  Intake 1868.52 ml  Output 4090 ml  Net -2221.48 ml   Last 3 Weights 02/14/2021 02/24/2021 02/07/2021  Weight (lbs) 110 lb 0.2 oz 110 lb 0.2 oz 110 lb  Weight (kg) 49.9 kg 49.9 kg 49.896 kg      Telemetry    Normal sinus rhythm.- Personally Reviewed  ECG     - Personally Reviewed  Physical Exam   GEN: Frail looking elderly female who is intubated and sedated.  She looks older than her stated age. Neck: Jugular venous pressure is not well visualized. Cardiac: RRR, no murmurs, rubs, or gallops.  Respiratory: Coarse breath sounds bilaterally. GI: Soft, nontender, non-distended  MS: No edema; No deformity. Neuro:  Nonfocal  Psych: Normal affect   Labs    High Sensitivity Troponin:  No results for input(s): TROPONINIHS in the last 720 hours.   Chemistry Recent Labs  Lab 02/15/21 1154 02/16/21 0545 02/17/21 1422 02/17/21 2055 02/18/21 0332 02/19/21 0400  NA  --    < > 145 145 146* 143  K  --    < >  2.9* 3.5 4.6 2.3*  CL  --    < > 94* 97* 100 91*  CO2  --    < > 38* 36* 36* 41*  GLUCOSE  --    < > 132* 173* 157* 262*  BUN  --    < > 30* 29* 30* 28*  CREATININE  --    < > 0.66 0.65 0.56 0.78  CALCIUM  --    < > 9.4 8.6* 8.1* 8.6*  MG  --    < > 1.8  --  2.5* 1.9  PROT 5.8*  --  7.3 5.9*  --   --   ALBUMIN  --   --  3.5 2.9*  --   --   AST  --   --  16 18  --   --   ALT  --   --  13 12  --   --   ALKPHOS  --   --  76 58  --   --   BILITOT  --   --  1.0 1.1  --   --   GFRNONAA  --    < > >60 >60 >60 >60  ANIONGAP  --    < > 13 12 10 11    < > = values in this interval not displayed.    Lipids  Recent Labs  Lab 02/16/21 0545  TRIG 51    Hematology Recent Labs  Lab 02/17/21 0440 02/18/21 0332 02/19/21 0400  WBC 13.6* 8.6 10.5  RBC 2.93* 2.93* 2.59*  HGB  8.7* 8.5* 7.6*  HCT 27.6* 27.2* 23.7*  MCV 94.2 92.8 91.5  MCH 29.7 29.0 29.3  MCHC 31.5 31.3 32.1  RDW 14.4 14.2 13.9  PLT 362 330 278   Thyroid No results for input(s): TSH, FREET4 in the last 168 hours.  BNP Recent Labs  Lab 02/15/21 0457 02/17/21 0440  BNP 963.0* 1,512.9*    DDimer  Recent Labs  Lab 02/15/21 0450  DDIMER 2.25*     Radiology    DG Abd 1 View  Result Date: 02/18/2021 CLINICAL DATA:  OG tube placement. EXAM: ABDOMEN - 1 VIEW COMPARISON:  Earlier same day FINDINGS: 1210 hours. OG tube tip is positioned in the distal stomach. Bowel gas pattern is nonspecific. Airspace disease noted in both lung bases. IMPRESSION: OG tube tip is positioned in the distal stomach. Electronically Signed   By: Misty Stanley M.D.   On: 02/18/2021 12:45   DG Abd 1 View  Result Date: 02/18/2021 CLINICAL DATA:  Check for aerophagia. Patient is placed on bipap and is complaining of abdominal pain and bloating. EXAM: ABDOMEN - 1 VIEW COMPARISON:  02/14/2021.  CT, 02/26/2019. FINDINGS: Paucity of bowel gas. No significant gastric air. No evidence of bowel dilation or gastric distension. Soft tissues unremarkable. No acute skeletal abnormality. IMPRESSION: 1. No acute findings. No evidence of bowel obstruction. No bowel or gastric distention. Electronically Signed   By: Lajean Manes M.D.   On: 02/18/2021 10:28   DG Chest Port 1 View  Result Date: 02/18/2021 CLINICAL DATA:  Intubation. EXAM: PORTABLE CHEST 1 VIEW COMPARISON:  Earlier same day FINDINGS: 1209 hours. Endotracheal tube tip is approximately 4.1 cm above the base of the carina. The NG tube passes into the stomach although the distal tip position is not included on the film. Right IJ central line tip overlies the mid SVC level. Asymmetric left greater than right airspace disease again noted, stable to minimally improved in  the interval. Telemetry leads overlie the chest. IMPRESSION: 1. Endotracheal tube tip is approximately 4.1 cm above  the base of the carina. 2. Stable to minimally improved asymmetric left greater than right airspace disease. Electronically Signed   By: Misty Stanley M.D.   On: 02/18/2021 12:41   DG Chest Port 1 View  Result Date: 02/18/2021 CLINICAL DATA:  Shortness of breath.  Breast cancer. EXAM: PORTABLE CHEST 1 VIEW COMPARISON:  02/18/2021 FINDINGS: Diffuse bilateral airspace disease is similar to prior. Cardiopericardial silhouette is at upper limits of normal for size. Right IJ central line tip overlies the mid to distal SVC. Telemetry leads overlie the chest. IMPRESSION: Stable exam. Diffuse bilateral airspace disease compatible with diffuse infection. Asymmetric edema is a less likely consideration by imaging. Electronically Signed   By: Misty Stanley M.D.   On: 02/18/2021 09:46   DG Chest Port 1 View  Result Date: 02/18/2021 CLINICAL DATA:  Hypoxia. Patient laying supine and sideways in bed and unable to be moved. EXAM: PORTABLE CHEST 1 VIEW COMPARISON:  Prior chest x-ray yesterday 02/17/2021 FINDINGS: Stable position of right IJ central venous catheter with catheter tip in the mid SVC. Similar appearance of the lungs with diffuse bilateral interstitial airspace opacities more confluent in the bilateral upper lungs most consistent with multifocal pneumonia. Calcified mediastinal lymph nodes again noted. Trace calcifications in the thoracic aorta. The heart is normal in size. No pneumothorax. No acute osseous abnormality. IMPRESSION: Persistent extensive bilateral interstitial and patchy airspace opacities most confluent in the upper lungs concerning for multifocal pneumonia. Stable position of right IJ central venous catheter. Electronically Signed   By: Jacqulynn Cadet M.D.   On: 02/18/2021 06:38   DG Chest Port 1 View  Result Date: 02/17/2021 CLINICAL DATA:  Sepsis history of breast cancer EXAM: PORTABLE CHEST 1 VIEW COMPARISON:  02/16/2021, CT chest 02/15/2021, radiographs 02/14/2021, 03/07/2021 FINDINGS:  Interval extubation and removal of esophageal tube. Right IJ central venous catheter tip over the SVC. Extensive bilateral airspace disease with some improvement in aeration at the right base. Stable cardiomediastinal silhouette with aortic atherosclerosis. Small calcified mediastinal lymph nodes and hilar nodes. No pneumothorax. IMPRESSION: 1. Interval extubation and removal of esophageal tube. 2. Extensive bilateral airspace disease with some improvement in aeration of right lower lobe. Airspace disease otherwise without significant change Electronically Signed   By: Donavan Foil M.D.   On: 02/17/2021 17:40    Cardiac Studies   An echocardiogram was done on January 5 which showed an EF of 40% with severe hypokinesis of mid apical segments highly suggestive of stress-induced cardiomyopathy, mild mitral and aortic regurgitation. I personally reviewed the study.  Patient Profile     61 y.o. female  a hx of intellectual disability, hypertension, seizures, breast cancer who is being seen 02/18/2021 for the evaluation of heart failure in the setting of E. coli bacteremia.  Assessment & Plan    1.  Acute systolic heart failure: Echocardiogram is highly suggestive of stress-induced cardiomyopathy.  The patient was reintubated within 48 hours of extubation due to what seems to be flash pulmonary edema.  Chest x-ray from yesterday still shows significant volume overload.  I agree with furosemide drip. Blood pressure is low and does not allow the addition of heart failure medications. I do not think the patient is a good candidate for ischemic cardiac evaluation even after recovery.  2.  Septic shock due to E. coli bacteremia likely from a urinary source.  Continue antibiotics.  3.  Severe  anemia with a hemoglobin of 7.6.  Likely contributing to the heart failure.  Consider transfusion if hemoglobin goes below 7.     For questions or updates, please contact [place] Please consult [email or web address]  for contact info under        Signed, Kathlyn Sacramento, MD  02/19/2021, 11:51 AM

## 2021-02-19 NOTE — Consult Note (Addendum)
PHARMACY CONSULT NOTE - FOLLOW UP  Pharmacy Consult for Electrolyte Monitoring and Replacement   Recent Labs: Potassium (mmol/L)  Date Value  02/19/2021 2.3 (LL)  01/08/2013 4.0   Magnesium (mg/dL)  Date Value  02/19/2021 1.9   Calcium (mg/dL)  Date Value  02/19/2021 8.6 (L)   Calcium, Total (mg/dL)  Date Value  01/08/2013 9.6   Albumin (g/dL)  Date Value  02/17/2021 2.9 (L)  04/11/2020 4.3  01/08/2013 4.1   Phosphorus (mg/dL)  Date Value  02/19/2021 2.4 (L)   Sodium (mmol/L)  Date Value  02/19/2021 143  04/11/2020 141  01/08/2013 136     Assessment: 61yo female w/ h/o learning and intellectual disability, HTN, seizures, cognitive decline, history of left lower breast cancer ER positive, who presented to CC of fall and imbalance. Over next 24hrs progressive acute hypoxic respiratory failure requiring intubation and admission to ICU. Pharmacy has been consulted for the management of electrolytes.  Patient is on Lasix infusion at this time.  Goal of Therapy:  Electrolytes WNL  Plan:  --Potassium 40 mEq per tube + 10 mEq IV x 4 for K 2.3 this morning --Repeat K pending - will replace as needed --Follow up electrolytes with morning labs  Tawnya Crook, PharmD, BCPS Clinical Pharmacist 02/19/2021 3:15 PM

## 2021-02-19 NOTE — TOC Initial Note (Signed)
Transition of Care [facility]) - Initial/Assessment Note    Patient Details  Name: Helen Patton MRN: [id number] Date of Birth: [birth year]  Transition of Care [facility]) CM/SW Contact:    Helen Salen, RN Phone Number: 02/19/2021, 2:57 PM  Clinical Narrative:  Patient is intubated and sedated, Mother Helen Patton is at bedside who reports that patient is non-verbal, lives in [place] who provides total care for patient who is also MR. Mother would like to be called if needed, she lives at the [place], [phone]. TOC to continue to track.                       Patient Goals and CMS Choice        Expected Discharge Plan and Services                                                Prior Living Arrangements/Services                       Activities of Daily Living Home Assistive Devices/Equipment: None ADL Screening (condition at time of admission) Patient's cognitive ability adequate to safely complete daily activities?: No Is the patient deaf or have difficulty hearing?: No Does the patient have difficulty seeing, even when wearing glasses/contacts?: No Does the patient have difficulty concentrating, remembering, or making decisions?: No Patient able to express need for assistance with ADLs?: No Does the patient have difficulty dressing or bathing?: Yes Independently performs ADLs?: No Communication: Dependent Is this a change from baseline?: Change from baseline, expected to last >3 days Dressing (OT): Dependent Is this a change from baseline?: Change from baseline, expected to last >3 days Grooming: Dependent Is this a change from baseline?: Change from baseline, expected to last >3 days Feeding: Dependent Is this a change from baseline?: Change from baseline, expected to last >3 days Bathing: Dependent Is this a change from baseline?: Change from baseline, expected to last >3 days Toileting: Dependent Is this a change from  baseline?: Change from baseline, expected to last >3days In/Out Bed: Dependent Is this a change from baseline?: Change from baseline, expected to last >3 days Walks in Home: Dependent Is this a change from baseline?: Change from baseline, expected to last >3 days Does the patient have difficulty walking or climbing stairs?: Yes Weakness of Legs: None (UTA) Weakness of Arms/Hands: None (UTA)  Permission Sought/Granted                  Emotional Assessment              Admission diagnosis:  Shortness of breath [R06.02] Acute respiratory failure with hypoxia ([place]) [J96.01] Cystitis [N30.90] Severe sepsis with acute organ dysfunction ([place]) [A41.9, R65.20] Community acquired pneumonia, unspecified laterality [J18.9] Patient Active Problem List   Diagnosis Date Noted   Acute respiratory failure with hypoxia ([place]) 02/14/2021   Severe sepsis with acute organ dysfunction ([place]) 03/08/2021   Leukocytosis 02/24/2021   Foul smelling urine 02/07/2021   Acute cough 02/07/2021   Rhinorrhea 02/07/2021   Sepsis ([place]) 02/26/2019   Postmenopausal bleeding 04/22/2018   B12 deficiency 05/17/2016   S/P total hip arthroplasty 08/23/2015   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   Closed fracture of neck of right femur ([place]) 03/07/2015   Anxiety 12/14/2014  Arthralgia of hip 12/14/2014   Athlete's foot 12/14/2014   Cerebral palsy ([place]) 12/14/2014   Cognitive decline 12/14/2014   Clinical depression 12/14/2014   Acid reflux 12/14/2014   HLD (hyperlipidemia) 12/14/2014   BP (high blood pressure) 12/14/2014   Methicillin resistant Staphylococcus aureus infection 12/14/2014   Adiposity 12/14/2014   Breast cancer ([place]) 11/21/2013   Intellectual disability 11/16/2013   Seizure ([place]) 09/30/2013   PCP:  Jerrol Banana., MD Pharmacy:   [place], [place] - [address] [place] [address] Phone:  [phone] Fax: [phone]     Social Determinants of Health (SDOH) Interventions    Readmission Risk Interventions No flowsheet data found.

## 2021-02-19 NOTE — Progress Notes (Signed)
Nutrition Follow Up Note   DOCUMENTATION CODES:   Not applicable  INTERVENTION:   Initiate Vital 1.2@ 37m/hr + ProSource TF 420mdaily via tube   Free water flushes 3025m4 hours to maintain tube patency   Regimen provides 1192kcal/day, 83g/day protein and 958m85my free water   Liquid MVI daily via tube  NUTRITION DIAGNOSIS:   Inadequate oral intake related to inability to eat (pt sedated and ventilated) as evidenced by NPO status.  GOAL:   Provide needs based on ASPEN/SCCM guidelines -met with tube feeds   MONITOR:   Vent status, Labs, Weight trends, TF tolerance, Skin, I & O's  ASSESSMENT:   60 y62 female with h/o metal retardation, breast cancer s/p lumpectomy, cerebral palsy, depression, anxiety, HLD, HTN, seizures, hiatal hernia and non verbal at baseline who is admitted with possible PNA, UTI, E. coli bacteremia,  decompensated heart failure and sepsis.  Pt extubated 1/7 and re-intubated 1/8; pt remains sedated and ventilated. OGT in place. Will re-start tube feeds today. No BM noted since admission; pt initiated on bowel regimen 1/8. No new weight since 1/4; will request daily weights.   Medications reviewed and include: colace, lovenox, insulin, protonix, miralax, cefazolin, lasix, vasopressin   Labs reviewed: K 2.3(L), BUN 28(H), P 2.4(L), Mg 1.9 wnl Hgb 7.6(L), Hct 23.7(L) Cbgs- 196, 109, 233 x 24 hrs  Patient is currently intubated on ventilator support MV: 8.5 L/min Temp (24hrs), Avg:98.9 F (37.2 C), Min:98.2 F (36.8 C), Max:100 F (37.8 C)  Propofol: none   MAP- >65mm2m UOP- 3950ml 80mt Order:   Diet Order             Diet NPO time specified  Diet effective now                  EDUCATION NEEDS:   No education needs have been identified at this time  Skin:  Skin Assessment: Reviewed RN Assessment  Last BM:  pta  Height:   Ht Readings from Last 1 Encounters:  02/14/21 4' 11.02" (1.499 m)    Weight:   Wt Readings from  Last 1 Encounters:  02/14/21 49.9 kg    Ideal Body Weight:  44.5 kg  BMI:  Body mass index is 22.21 kg/m.  Estimated Nutritional Needs:   Kcal:  1160kcal/day  Protein:  75-85g/day  Fluid:  1.4-1.6L/day  Jurney Overacker Koleen DistanceD, LDN Please refer to [facility] and/or RD on-call/weekend/after hours pager

## 2021-02-19 NOTE — Progress Notes (Addendum)
NAME:  AEISHA MINARIK, MRN:  [id number], DOB:  1960/04/03, LOS: 6 ADMISSION DATE:  03/11/2021, CONSULTATION DATE:  02/14/2021 REFERRING MD:  Sharion Settler, NP  CHIEF COMPLAINT: SOB   Brief Pt Description / Synopsis  61 y.o. Female admitted with   HPI  61 y.o  female with significant PMH of learning and intellectual disability, hypertension, seizures, cognitive decline, history of left lower breast cancer ER positive, who presents emergency department for chief concerns of a fall and imbalance.  ED Course: On arrival to the ED, she was afebrile with heart rate 80 beats/minute, the blood pressure  84/40 mm Hg, the respiratory rate 16 breaths/minute, and the oxygen saturation 95%  on 4L.  An electrocardiogram showed rhythm, and nonspecific ST-T-wave changes. Pertinent Labs /Diagnostics Findings: serum sodium of 135, potassium 4.7, chloride 102, bicarb 27, BUN of 24, serum creatinine of 0.88, nonfasting blood glucose 135, WBC elevated at 17.5, hemoglobin 10.1, platelets of 266.  Lactic acid was not elevated at 1.9.  COVID/influenza A/influenza B PCR were negative. UA was positive for small leukocytes, and nitrates is positive.. Chest xray concerning for multifocal pneumonia.  Patient received 1L bolus NS and was treated with ceftriaxone and Azithromycin. She was admitted to hospital service.  Hospital Course. While holding in the ED pending admission to stepdown, patient was noted with worsening hypoxia with increased oxygen requirement. She desaturated to mid 70s and was placed on [facility] M with minimal improvement. She eventually required BiPAP however was noted to have increased work of breathing with RR in the mid 30's. ABG was obtained and showed PAO2 of 56. Chest x-ray ordered; impression read as worsening infection or edema.  20 mg IV furosemide administered and PCCM consulted due to high risk for intubation. Patient was transferred to the ICU and intubated upon arrival.  Past Medical History    Anxiety, Arthritis, Breast cancer ([place]) (2015), Cancer ([place]) (11/2013), Cataract, Depression, Difficulty swallowing, Falls, GERD (gastroesophageal reflux disease), Hidradenitis (2012), History of hiatal hernia, Hyperlipidemia, Hypertension, Mental retardation, MRSA (methicillin resistant staph aureus) culture positive (2012), Osteoporosis, Pneumonia, and Seizures ([place]).  Significant Hospital Events   1/3: Admitted to hospitalist service with acute hypoxic respiratory failure 1/4: Transferred to the ICU for worsening respiratory status requiring intubation.  PCCM consulted 02/16/21- patient is severe critically ill  02/17/21-  extubated today  02/18/21- patient with severe pulm edema, REINTUBATED.  S/p CXR and repeat ABG. S/p TTE.  Cardio consultation placed for new onset CHF. Place on Lasix gtt 02/19/21: Continuing Lasix gtt per Cardiology, diuresing well, FiO2 decreased to 60% on vent  Consults:  PCCM  Procedures:  1/4: intubation (extubated 1/7) 1/8: Reintubation  Significant Diagnostic Tests:  1/4: Chest Xray>Marked progression of bilateral airspace disease consistent with worsening infection or edema. 2. Small bilateral pleural effusions, increased since prior study. 1/4: CT head: 1. No evidence of acute intracranial abnormality. 2. Mild chronic small vessel ischemic changes within the cerebral white matter. 3. Mild cerebral atrophy. 4. As before, there is partial opacification of the right ethmoid air cells with nonspecific polypoid soft tissue extending from this region into the right nasal passage. Associated thinned appearance of the lateral lamella of the cribriform plate on the right. Dehiscence at this site cannot be excluded, and a non-emergent maxillofacial CT is recommended for further evaluation. 5. Right greater than left maxillary sinus disease at the imaged levels, as described. 1/4 CT cervical spine: 1. No evidence of acute fracture to the cervical spine. 2. Straightening  of  the expected cervical lordosis. 3. Partially imaged right pleural effusion 1/5: CTA Chest: 1. No evidence of pulmonary embolism. 2. Near-complete consolidation in the bilateral lower lobes with scattered additional patchy opacities throughout the remainder of the lungs and moderate bilateral pleural effusions consistent with multifocal pneumonia.  Micro Data:  1/4: SARS-CoV-2 PCR> negative 1/4: Influenza PCR> negative 1/4: Blood culture x2> no growth 1/4: Urine Culture> E.coli (pan sensitive) 1/4: MRSA PCR>> not dectected 1/4: Strep pneumo urinary antigen>negative 1/4: Legionella urinary antigen>negative 1/5: Tracheal aspirate >>normal respiratory flora 1/5: RVP>> negative  Antimicrobials:  Azithromycin 1/4>1/5 Ceftriaxone 1/4>1/5 Metronidazole 1/4>x1 Vancomycin 1/5>1/5 Cefepime 1/5>1/5 Cefazolin 1/8>>  OBJECTIVE  Blood pressure (!) 92/49, pulse 78, temperature 98.2 F (36.8 C), temperature source Axillary, resp. rate 20, height 4' 11.02" (1.499 m), weight 49.9 kg, SpO2 98 %. CVP:  [1 mmHg-23 mmHg] 9 mmHg  Vent Mode: PRVC FiO2 (%):  [60 %-100 %] 60 % Set Rate:  [18 bmp] 18 bmp Vt Set:  [450 mL] 450 mL PEEP:  [10 cmH20] 10 cmH20 Plateau Pressure:  [20 cmH20] 20 cmH20   Intake/Output Summary (Last 24 hours) at 02/19/2021 1025 Last data filed at 02/19/2021 0944 Gross per 24 hour  Intake 1677.99 ml  Output 4145 ml  Net -2467.01 ml   Filed Weights   02/14/21 2019  Weight: 49.9 kg   Physical Examination  GENERAL: 61 year-old critically ill patient lying in the bed intubated and sedated EYES: Pupils equal, round, reactive to light and accommodation. No scleral icterus. Extraocular muscles intact.  HEENT: Head atraumatic, normocephalic. Oropharynx and nasopharynx clear.  NECK:  Supple, no jugular venous distention. No thyroid enlargement, no tenderness.  LUNGS: Decreased breath sounds bilaterally, no wheezing, Moderate rales and rhonchi. No use of accessory muscles of  respiration.  CARDIOVASCULAR: S1, S2 normal. No murmurs, rubs, or gallops.  ABDOMEN: Soft, nontender, nondistended. Bowel sounds present. No organomegaly or mass.  EXTREMITIES: No pedal edema, cyanosis, or clubbing.  NEUROLOGIC: Cranial nerves II through XII are intact.  Muscle strength not checked. Sensation intact. Gait not checked.  PSYCHIATRIC: The patient is intubated and sedated SKIN: No obvious rash, lesion, or ulcer.   Labs/imaging that I havepersonally reviewed  (right click and "Reselect all SmartList Selections" daily)     Labs   CBC: Recent Labs  Lab 02/14/21 0614 02/15/21 0457 02/16/21 0545 02/17/21 0440 02/18/21 0332 02/19/21 0400  WBC 13.7* 18.4* 18.5* 13.6* 8.6 10.5  NEUTROABS 10.0*  --   --   --   --   --   HGB 10.0* 8.7* 8.6* 8.7* 8.5* 7.6*  HCT 31.4* 27.1* 27.3* 27.6* 27.2* 23.7*  MCV 94.3 95.4 94.5 94.2 92.8 91.5  PLT 299 275 344 362 330 278     Basic Metabolic Panel: Recent Labs  Lab 02/16/21 0545 02/17/21 0440 02/17/21 1422 02/17/21 2055 02/18/21 0332 02/19/21 0400  NA 138 140 145 145 146* 143  K 4.4 4.5 2.9* 3.5 4.6 2.3*  CL 105 102 94* 97* 100 91*  CO2 28 32 38* 36* 36* 41*  GLUCOSE 107* 146* 132* 173* 157* 262*  BUN 24* 29* 30* 29* 30* 28*  CREATININE 0.52 0.71 0.66 0.65 0.56 0.78  CALCIUM 8.2* 8.9 9.4 8.6* 8.1* 8.6*  MG 2.7* 2.4 1.8  --  2.5* 1.9  PHOS 3.6 3.4 3.7 3.1 2.6 2.4*    GFR: Estimated Creatinine Clearance: 49.7 mL/min (by C-G formula based on SCr of 0.78 mg/dL). Recent Labs  Lab 03/05/2021 1704 02/15/2021 2245 02/14/21 1610  02/15/21 0457 02/16/21 0545 02/17/21 0440 02/18/21 0332 02/19/21 0400  PROCALCITON  --  0.42 <0.10  --   --   --   --   --   WBC  --   --  13.7*   < > 18.5* 13.6* 8.6 10.5  LATICACIDVEN 1.9  --   --   --   --   --   --   --    < > = values in this interval not displayed.     Liver Function Tests: Recent Labs  Lab 02/15/21 1154 02/17/21 1422 02/17/21 2055  AST  --  16 18  ALT  --  13 12   ALKPHOS  --  76 58  BILITOT  --  1.0 1.1  PROT 5.8* 7.3 5.9*  ALBUMIN  --  3.5 2.9*   No results for input(s): LIPASE, AMYLASE in the last 168 hours. No results for input(s): AMMONIA in the last 168 hours.  ABG    Component Value Date/Time   PHART 7.52 (H) 02/18/2021 1441   PCO2ART 44 02/18/2021 1441   PO2ART 68 (L) 02/18/2021 1441   HCO3 35.9 (H) 02/18/2021 1441   ACIDBASEDEF 0.9 02/14/2021 2216   O2SAT 95.1 02/18/2021 1441      Coagulation Profile: Recent Labs  Lab 02/14/21 0614  INR 1.0     Cardiac Enzymes: No results for input(s): CKTOTAL, CKMB, CKMBINDEX, TROPONINI in the last 168 hours.  HbA1C: Hgb A1c MFr Bld  Date/Time Value Ref Range Status  02/16/2021 05:45 AM 5.2 4.8 - 5.6 % Final    Comment:    (NOTE) Pre diabetes:          5.7%-6.4%  Diabetes:              >6.4%  Glycemic control for   <7.0% adults with diabetes   02/15/2021 04:57 AM 5.1 4.8 - 5.6 % Final    Comment:    (NOTE) Pre diabetes:          5.7%-6.4%  Diabetes:              >6.4%  Glycemic control for   <7.0% adults with diabetes     CBG: Recent Labs  Lab 02/18/21 1543 02/18/21 1922 02/18/21 2320 02/19/21 0401 02/19/21 0724  GLUCAP 233* 150* 163* 233* 109*    Review of Systems:   UNABLE TO OBTAIN PATIENT IS INTUBATED  Past Medical History  She,  has a past medical history of Anxiety, Arthritis, Breast cancer ([place]) (2015), Cancer ([place]) (11/2013), Cataract, Depression, Difficulty swallowing, Falls, GERD (gastroesophageal reflux disease), Hidradenitis (2012), History of hiatal hernia, Hyperlipidemia, Hypertension, Mental retardation, MRSA (methicillin resistant staph aureus) culture positive (2012), Osteoporosis, Pneumonia, and Seizures ([place]).   Surgical History    Past Surgical History:  Procedure Laterality Date   BREAST LUMPECTOMY Left 12/07/2013   T1b, NX, ER/PR positive, HER-2/neu not overexpressed, 7 mm.  Not candidate for chemotherapy/sentinel node/radiation  based on mental retardation.   BREAST SURGERY Left 12/07/2013   T1b, Nx; ER+, PR+, her 2 not amplified. 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 She has never used smokeless tobacco. She reports that she does not drink alcohol and does not use drugs.   Family History   Her family history includes Arthritis in her mother; Autoimmune disease in her brother; Breast cancer in her maternal aunt; Cancer in her paternal grandmother; Cancer (age of onset: 67) in her maternal aunt; Diabetes in her father and mother; Hypertension in her maternal grandmother; Neuropathy in her mother; Parkinson's disease in her father.   Allergies Allergies  Allergen Reactions   Sulfa Antibiotics Other (See Comments)    "Does not know"   Keflex [Cephalexin] Other (See Comments)    "Does not know" unknown     Home Medications  Prior to Admission medications   Medication Sig Start Date End Date Taking? Authorizing Provider  alendronate (FOSAMAX) 70 MG tablet TAKE 1 TABLET  BY MOUTH WEEKLY EARLY MORNING BEFORE FOOD/MEDS WITH WATER. DO NOT LIE DOWN FOR 30 MINUTES 09/03/18  Yes Jerrol Banana., MD  amLODipine (NORVASC) 10 MG tablet TAKE 1 TABLET BY MOUTH AT BEDTIME 09/03/18  Yes Jerrol Banana., MD  BENZOYL PEROXIDE 5 % external wash USE TOPICALLY ONCE DAILY 10/27/19  Yes Jerrol Banana., MD  calcium-vitamin D  (OSCAL WITH D) 500-200 MG-UNIT TABS tablet TAKE 1 TABLET BY MOUTH EVERY DAY WITH BREAKFAST 11/03/18  Yes Jerrol Banana., MD  cetirizine (ZYRTEC) 10 MG tablet Take 1 tablet (10 mg total) by mouth daily. 12/23/18  Yes Jerrol Banana., MD  chlorhexidine (PERIDEX) 0.12 % solution Use as directed 15 mLs in the mouth or throat 2 (two) times daily.   Yes [provider]  cyanocobalamin (,VITAMIN B-12,) 1000 MCG/ML injection INJECT 1ML=1000MCG INTRAMUSCULARLY EVERY MONTH (B-12 SUPPLEMENT) 10/02/20  Yes Jerrol Banana., MD  docusate sodium (COLACE) 100 MG capsule TAKE 1 CAPSULE BY MOUTH 2 TIMES PER DAY 09/03/18  Yes Jerrol Banana., MD  DULoxetine (CYMBALTA) 30 MG capsule Take 30 mg by mouth daily.   Yes [provider]  DULoxetine (CYMBALTA) 60 MG capsule Take 1 capsule (60 mg total) by mouth daily. 04/10/18  Yes Jerrol Banana., MD  ipratropium (ATROVENT) 0.03 % nasal spray Place 2 sprays into both nostrils 3 (three) times daily. 02/24/18  Yes Bacigalupo, [name], MD  lamoTRIgine (LAMICTAL) 150 MG tablet TAKE 2 TABLETS BY MOUTH 2 TIMES PER DAY 09/03/18  Yes Jerrol Banana., MD  LORazepam (ATIVAN) 1 MG tablet Take 1 tablet by mouth every morning, and 1/2 tablet at noon and bedtime. Patient taking differently: Take 0.5-1 mg by mouth every 8 (eight) hours as needed for anxiety or sleep. Take 1 mg (one tablet) by mouth every morning and 0.5 mg (one-half tablet) at noon and at bedtime 12/03/18  Yes Jerrol Banana., MD  losartan (COZAAR) 50 MG tablet TAKE 2 TABLETS (100 MG) BY MOUTH ONCE DAILY 04/11/20  Yes Jerrol Banana., MD  pantoprazole (PROTONIX) 40 MG tablet TAKE 1 TABLET BY MOUTH 2 TIMES PER DAY **DO NOT CRUSH** 12/23/18  Yes Jerrol Banana., MD  senna (SENOKOT) 8.6 MG TABS tablet Take 1 tablet (8.6 mg total) by mouth 2 (two) times daily. 03/01/19  Yes  Lavina Hamman, MD  acetaminophen (TYLENOL) 325 MG tablet Take 2 tablets (650 mg  total) by mouth every 6 (six) hours as needed. 07/14/17   Jerrol Banana., MD  BD ECLIPSE SYRINGE 25G X 1" 3 ML MISC FOR USE WITH B12 10/02/20   Jerrol Banana., MD  clotrimazole (LOTRIMIN) 1 % cream APPLY TOPICALLY TWICE A DAY AS NEEDED FOR RASH IN SKIN FOLDS 02/05/17   Jerrol Banana., MD  diphenhydrAMINE (BENADRYL) 12.5 MG chewable tablet Chew 1 tablet (12.5 mg total) by mouth 3 (three) times daily. 04/02/18   Jerrol Banana., MD  guaiFENesin-dextromethorphan [facility] DM) 100-10 MG/5ML syrup Take 5 mLs by mouth every 4 (four) hours as needed for cough. 02/07/21   Gwyneth Sprout, FNP  letrozole [facility]) 2.5 MG tablet TAKE 1 TABLET BY MOUTH ONCE DAILY *HAZARDOUS DRUG: WEAR GLOVES* Patient not taking: Reported on 10/17/2020 09/25/19   Jerrol Banana., MD  levETIRAcetam (KEPPRA) 250 MG tablet Take 1 tablet (250 mg total) by mouth 2 (two) times daily for 2 days. Then stop. Patient not taking: No sig reported 03/01/19 03/03/19  Lavina Hamman, MD  LORazepam (ATIVAN) 0.5 MG tablet Take 1.5 mg by mouth as directed. Take 1.5 mg (three tablets) by mouth one hour prior to dental appointment    [provider]  ondansetron (ZOFRAN ODT) 4 MG disintegrating tablet Take 1 tablet (4 mg total) by mouth every 8 (eight) hours as needed for nausea or vomiting. 06/06/20   Brunetta Jeans, PA-C  sodium chloride (OCEAN) 0.65 % SOLN nasal spray Place 1 spray into both nostrils as needed for congestion. 02/07/21   Gwyneth Sprout, FNP  terbinafine (LAMISIL) 1 % cream USE 2 TIMES A WEEK AS NEEDED 01/22/21   Birdie Sons, MD  THERAPEUTIC SHAMPOO 0.5 % shampoo APPLY TOPICALLY AT BEDTIME AS NEEDED 11/16/20   Jerrol Banana., MD  triamcinolone (KENALOG) 0.025 % ointment APPLY 1 APPLICATION TOPICALLY TWICE A DAY AS NEEDED 01/22/21   Birdie Sons, MD    Scheduled Meds:  chlorhexidine gluconate (MEDLINE KIT)  15 mL Mouth Rinse BID   Chlorhexidine Gluconate Cloth  6 each  Topical Daily   docusate  100 mg Per Tube BID   DULoxetine  30 mg Oral Daily   DULoxetine  60 mg Oral Daily   enoxaparin (LOVENOX) injection  40 mg Subcutaneous Q24H   guaiFENesin  600 mg Oral BID   hydrocortisone sod succinate (SOLU-CORTEF) inj  50 mg Intravenous Q12H   insulin aspart  0-20 Units Subcutaneous Q4H   ipratropium-albuterol  3 mL Nebulization Q6H   lamoTRIgine  300 mg Per Tube BID   mouth rinse  15 mL Mouth Rinse 10 times per day   midodrine  5 mg Per Tube TID WC   multivitamin  15 mL Per Tube Daily   pantoprazole (PROTONIX) IV  40 mg Intravenous Q24H   polyethylene glycol  17 g Per Tube Daily   Continuous Infusions:  sodium chloride Stopped (02/17/21 1558)    ceFAZolin (ANCEF) IV 1 g (02/19/21 0947)   dexmedetomidine (PRECEDEX) IV infusion 1.2 mcg/kg/hr (02/19/21 0907)   fentaNYL infusion INTRAVENOUS 325 mcg/hr (02/19/21 0800)   furosemide (LASIX) 200 mg in dextrose 5% 100 mL (26m/mL) infusion 4 mg/hr (02/19/21 0800)   levETIRAcetam 500 mg (02/19/21 0824)   norepinephrine (LEVOPHED) Adult infusion Stopped (02/18/21 1424)   vasopressin Stopped (02/19/21 0944)   PRN Meds:.acetaminophen (TYLENOL) oral liquid  160 mg/5 mL **OR** acetaminophen, fentaNYL, ipratropium-albuterol, midazolam, ondansetron **OR** ondansetron (ZOFRAN) IV  Assessment & Plan:   Acute Hypoxic / Hypercapnic Respiratory Failure secondary to multifocal pneumonia and pulmonary edema -Full vent support, implement lung protective strategies -Plateau pressures less than 30 cm H20 -Wean FiO2 & PEEP as tolerated to maintain O2 sats >92% -Follow intermittent Chest X-ray & ABG as needed -Spontaneous Breathing Trials when respiratory parameters met and mental status permits -Implement VAP Bundle -Prn Bronchodilators -ABX as above -Lasix gtt as per Cardiology  Septic Shock -Continuous cardiac monitoring -Maintain MAP >65 -IV fluids -Vasopressors as needed to maintain MAP goal -Trend lactic acid until  normalized -Trend HS Troponin until peaked -Echocardiogram pending  Septic due to multifocal pneumonia & E.coli UTI PMHx: MRSA (methicillin resistant staph aureus) culture positive (2012), recurrent pneumonia -Monitor fever curve -Trend WBC's & Procalcitonin -Follow cultures as above -Continue empiric Cefazolin pending cultures & sensitivities  Acute Metabolic Encephalopathy in setting of Sepsis Sedation needs in the setting of mechanical ventilation History of Seizures, Anxiety, Depression -Maintain a RASS goal of 0 to -1 -Fentanyl and Propofol to maintain RASS goal -Avoid sedating medications as able -Daily wake up assessment -On lamotrigine 300 mg p.o. twice daily -IV Keppra while on vent -PRN Lorazepam for breakthrough seizures -Seizure precaution    Best practice:  Diet:  NPO, tube feeds Pain/Anxiety/Delirium protocol (if indicated): Yes (RASS goal 0) VAP protocol (if indicated): Yes DVT prophylaxis: LMWH GI prophylaxis: PPI Glucose control:  SSI Yes Central venous access:  yes and still needed Arterial line:  N/A Foley:  Yes, and it is still needed Mobility:  bed rest  PT consulted: N/A Last date of multidisciplinary goals of care discussion [1/9] Code Status:  full code Disposition: ICU   = Goals of Care = Code Status Order: FULL  Primary Emergency Contact: [place], Home Phone: [phone] Wishes to pursue full aggressive treatment and intervention options, including CPR and intubation, but goals of care will be addressed on going with family if that should become necessary.   Critical care time: 40 minutes     Darel Hong, AGACNP-BC [facility] [place] epic messenger for cross cover needs If after hours, please call E-link   ICU ATTENDING ATTESTATION:  Patient seen and examined and relevant ancillary tests reviewed.   I agree with the assessment and plan of care as outlined by Darel Hong NP.  This patient  was not seen as a shared visit. The following reflects my independent critical care time.  I  personally  reviewed database in its entirety and discussed care plan in detail. In addition, this patient was discussed on multidisciplinary rounds.   I agree with assessment and plan.   REVIEW OF SYSTEMS  PATIENT IS UNABLE TO PROVIDE COMPLETE REVIEW OF SYSTEMS DUE TO SEVERE CRITICAL ILLNESS AND TOXIC METABOLIC ENCEPHALOPATHY  PHYSICAL EXAMINATION:  GENERAL:critically ill appearing, +resp distress EYES: Pupils equal, round, reactive to light.  No scleral icterus.  MOUTH: Moist mucosal membrane. INTUBATED NECK: Supple.  PULMONARY: +rhonchi, +wheezing CARDIOVASCULAR: S1 and S2.  No murmurs  GASTROINTESTINAL: Soft, nontender, -distended. Positive bowel sounds.  MUSCULOSKELETAL: No swelling, clubbing, or edema.  NEUROLOGIC: obtunded SKIN:intact,warm,dry      BP (!) 130/59 (BP Location: Left Arm)    Pulse 76    Temp 98.5 F (36.9 C) (Oral)    Resp 18    Ht 4' 11.02" (1.499 m)    Wt 49.9 kg    SpO2 97%  BMI 22.21 kg/m   CBC    Component Value Date/Time   WBC 10.5 02/19/2021 0400   RBC 2.59 (L) 02/19/2021 0400   HGB 7.6 (L) 02/19/2021 0400   HGB 11.6 04/11/2020 1117   HCT 23.7 (L) 02/19/2021 0400   HCT 34.1 04/11/2020 1117   PLT 278 02/19/2021 0400   PLT 317 04/11/2020 1117   MCV 91.5 02/19/2021 0400   MCV 88 04/11/2020 1117   MCV 91 01/08/2013 1742   MCH 29.3 02/19/2021 0400   MCHC 32.1 02/19/2021 0400   RDW 13.9 02/19/2021 0400   RDW 12.1 04/11/2020 1117   RDW 14.2 01/08/2013 1742   LYMPHSABS 1.7 02/14/2021 0614   LYMPHSABS 2.2 04/11/2020 1117   MONOABS 1.8 (H) 02/14/2021 0614   EOSABS 0.0 02/14/2021 0614   EOSABS 0.1 04/11/2020 1117   BASOSABS 0.0 02/14/2021 0614   BASOSABS 0.0 04/11/2020 1117     BMP Latest Ref Rng & Units 02/19/2021 02/19/2021 02/18/2021  Glucose 70 - 99 mg/dL - 262(H) 157(H)  BUN 8 - 23 mg/dL - 28(H) 30(H)  Creatinine 0.44 - 1.00 mg/dL - 0.78 0.56   BUN/Creat Ratio 12 - 28 - - -  Sodium 135 - 145 mmol/L - 143 146(H)  Potassium 3.5 - 5.1 mmol/L 3.0(L) 2.3(LL) 4.6  Chloride 98 - 111 mmol/L - 91(L) 100  CO2 22 - 32 mmol/L - 41(H) 36(H)  Calcium 8.9 - 10.3 mg/dL - 8.6(L) 8.1(L)        Critical Care Time devoted to patient care services described in this note is 55 Total Care Time minutes.   Overall, patient is critically ill, prognosis is guarded.  Patient with Multiorgan failure and at high risk for cardiac arrest and death.    Corrin Parker, M.D.  [facility]  Medical Director [place] Director [facility] Cardio-Pulmonary Department

## 2021-02-20 DIAGNOSIS — F79 Unspecified intellectual disabilities: Secondary | ICD-10-CM

## 2021-02-20 DIAGNOSIS — J189 Pneumonia, unspecified organism: Secondary | ICD-10-CM

## 2021-02-20 DIAGNOSIS — I480 Paroxysmal atrial fibrillation: Secondary | ICD-10-CM

## 2021-02-20 DIAGNOSIS — A419 Sepsis, unspecified organism: Secondary | ICD-10-CM | POA: Diagnosis not present

## 2021-02-20 DIAGNOSIS — Z170 Estrogen receptor positive status [ER+]: Secondary | ICD-10-CM

## 2021-02-20 DIAGNOSIS — C50512 Malignant neoplasm of lower-outer quadrant of left female breast: Secondary | ICD-10-CM | POA: Diagnosis not present

## 2021-02-20 DIAGNOSIS — Z789 Other specified health status: Secondary | ICD-10-CM

## 2021-02-20 DIAGNOSIS — G808 Other cerebral palsy: Secondary | ICD-10-CM

## 2021-02-20 DIAGNOSIS — J9601 Acute respiratory failure with hypoxia: Secondary | ICD-10-CM | POA: Diagnosis not present

## 2021-02-20 DIAGNOSIS — Z515 Encounter for palliative care: Secondary | ICD-10-CM

## 2021-02-20 DIAGNOSIS — R579 Shock, unspecified: Secondary | ICD-10-CM

## 2021-02-20 DIAGNOSIS — Z66 Do not resuscitate: Secondary | ICD-10-CM

## 2021-02-20 LAB — CBC
HCT: 24.2 % — ABNORMAL LOW (ref 36.0–46.0)
Hemoglobin: 7.8 g/dL — ABNORMAL LOW (ref 12.0–15.0)
MCH: 29.2 pg (ref 26.0–34.0)
MCHC: 32.2 g/dL (ref 30.0–36.0)
MCV: 90.6 fL (ref 80.0–100.0)
Platelets: 274 10*3/uL (ref 150–400)
RBC: 2.67 MIL/uL — ABNORMAL LOW (ref 3.87–5.11)
RDW: 14.3 % (ref 11.5–15.5)
WBC: 17.2 10*3/uL — ABNORMAL HIGH (ref 4.0–10.5)
nRBC: 0.8 % — ABNORMAL HIGH (ref 0.0–0.2)

## 2021-02-20 LAB — BASIC METABOLIC PANEL
Anion gap: 15 (ref 5–15)
BUN: 43 mg/dL — ABNORMAL HIGH (ref 8–23)
CO2: 37 mmol/L — ABNORMAL HIGH (ref 22–32)
Calcium: 8.8 mg/dL — ABNORMAL LOW (ref 8.9–10.3)
Chloride: 94 mmol/L — ABNORMAL LOW (ref 98–111)
Creatinine, Ser: 0.85 mg/dL (ref 0.44–1.00)
GFR, Estimated: >60 mL/min (ref >60)
Glucose, Bld: 198 mg/dL — ABNORMAL HIGH (ref 70–99)
Potassium: 2.5 mmol/L — CL (ref 3.5–5.1)
Sodium: 146 mmol/L — ABNORMAL HIGH (ref 135–145)

## 2021-02-20 LAB — MAGNESIUM: Magnesium: 2.1 mg/dL (ref 1.7–2.4)

## 2021-02-20 LAB — GLUCOSE, CAPILLARY
Glucose-Capillary: 196 mg/dL — ABNORMAL HIGH (ref 70–99)
Glucose-Capillary: 94 mg/dL (ref 70–99)
Glucose-Capillary: 241 mg/dL — ABNORMAL HIGH (ref 70–99)

## 2021-02-20 LAB — PHOSPHORUS: Phosphorus: 4.6 mg/dL (ref 2.5–4.6)

## 2021-02-20 MED ORDER — LORAZEPAM 2 MG/ML IJ SOLN
2.0000 mg | INTRAMUSCULAR | Status: DC | PRN
  Administered 2021-02-21: 2 mg via INTRAVENOUS
  Filled 2021-02-20 (×3): qty 1

## 2021-02-20 MED ORDER — SODIUM CHLORIDE 0.9 % IV SOLN
1.0000 mg/h | INTRAVENOUS | Status: DC
  Administered 2021-02-21: 4 mg/h via INTRAVENOUS
  Filled 2021-02-20 (×3): qty 5

## 2021-02-20 MED ORDER — MIDODRINE HCL 5 MG PO TABS
10.0000 mg | ORAL_TABLET | Freq: Three times a day (TID) | ORAL | Status: DC
  Administered 2021-02-20: 10 mg
  Filled 2021-02-20: qty 2

## 2021-02-20 MED ORDER — HALOPERIDOL 0.5 MG PO TABS
0.5000 mg | ORAL_TABLET | ORAL | Status: DC | PRN
  Filled 2021-02-20: qty 1

## 2021-02-20 MED ORDER — SODIUM CHLORIDE 0.9 % IV SOLN
0.5000 mg/h | INTRAVENOUS | Status: DC
  Administered 2021-02-20: 1 mg/h via INTRAVENOUS
  Filled 2021-02-20: qty 5

## 2021-02-20 MED ORDER — ONDANSETRON HCL 4 MG/2ML IJ SOLN: 4.0000 mg | Freq: Four times a day (QID) | INTRAMUSCULAR | Status: DC | PRN

## 2021-02-20 MED ORDER — HALOPERIDOL LACTATE 2 MG/ML PO CONC
0.5000 mg | ORAL | Status: DC | PRN
  Filled 2021-02-20: qty 0.3

## 2021-02-20 MED ORDER — ACETAMINOPHEN 325 MG PO TABS: 650.0000 mg | ORAL_TABLET | Freq: Four times a day (QID) | ORAL | Status: DC | PRN

## 2021-02-20 MED ORDER — GLYCOPYRROLATE 0.2 MG/ML IJ SOLN
0.2000 mg | INTRAMUSCULAR | Status: DC | PRN
  Administered 2021-02-20 – 2021-02-21 (×3): 0.2 mg via INTRAVENOUS
  Filled 2021-02-20 (×3): qty 1

## 2021-02-20 MED ORDER — POTASSIUM CHLORIDE 10 MEQ/50ML IV SOLN
10.0000 meq | INTRAVENOUS | Status: AC
  Administered 2021-02-20 (×4): 10 meq via INTRAVENOUS
  Filled 2021-02-20 (×4): qty 50

## 2021-02-20 MED ORDER — POLYVINYL ALCOHOL 1.4 % OP SOLN
1.0000 [drp] | Freq: Four times a day (QID) | OPHTHALMIC | Status: DC | PRN
  Filled 2021-02-20: qty 15

## 2021-02-20 MED ORDER — POTASSIUM CHLORIDE 20 MEQ PO PACK
40.0000 meq | PACK | Freq: Once | ORAL | Status: AC
  Administered 2021-02-20: 40 meq
  Filled 2021-02-20: qty 2

## 2021-02-20 MED ORDER — HYDROMORPHONE BOLUS VIA INFUSION
1.0000 mg | INTRAVENOUS | Status: DC | PRN
  Filled 2021-02-20: qty 1

## 2021-02-20 MED ORDER — ACETAMINOPHEN 650 MG RE SUPP: 650.0000 mg | Freq: Four times a day (QID) | RECTAL | Status: DC | PRN

## 2021-02-20 MED ORDER — HALOPERIDOL LACTATE 5 MG/ML IJ SOLN: 0.5000 mg | INTRAMUSCULAR | Status: DC | PRN

## 2021-02-20 MED ORDER — BIOTENE DRY MOUTH MT LIQD: 15.0000 mL | OROMUCOSAL | Status: DC | PRN

## 2021-02-20 MED ORDER — LORAZEPAM 2 MG/ML IJ SOLN
2.0000 mg | Freq: Once | INTRAMUSCULAR | Status: AC
  Administered 2021-02-20: 2 mg via INTRAVENOUS

## 2021-02-20 MED ORDER — MORPHINE SULFATE (PF) 2 MG/ML IV SOLN
2.0000 mg | INTRAVENOUS | Status: DC | PRN
  Administered 2021-02-20: 2 mg via INTRAVENOUS
  Administered 2021-02-21: 4 mg via INTRAVENOUS
  Filled 2021-02-20: qty 2
  Filled 2021-02-20: qty 1

## 2021-02-20 NOTE — Progress Notes (Signed)
Chaplain Maggie made initial visit at bedside to introduce spiritual care. Family friend and caregiver at bedside. Continued support available per on call chaplain.

## 2021-02-20 NOTE — Progress Notes (Signed)
Patient appears comfortable on 4 mg of dilaudid and 2L [place]. Family support provided.

## 2021-02-20 NOTE — Progress Notes (Signed)
Patient was extubated to 2L nasal cannula for comfort carr. There were no issues with the extubation. Family at bedside.

## 2021-02-20 NOTE — Progress Notes (Signed)
Progress note:  After reviewing the patient's chart and assessing the patient at bedside (MV with sedation), I attempted to speak with the patient's mother Helen Patton.    Number listed in chart does not allow for a voicemail.   I also attempted to speak to her from a number given by caregiver to the nurse - [phone].  No answer and HIPAA appropriate voicemail left.  I will await callback from family to continue advance care planning and goals of care discussion.  Palliative medicine team will continue to follow the patient throughout her hospitalization.  [place] Helen Iha, FNP-BC Palliative Medicine Team Team Phone # [phone]  NO CHARGE

## 2021-02-20 NOTE — Progress Notes (Signed)
Progress Note  Patient Name: Helen Patton Date of Encounter: 02/20/2021  [facility] Cardiologist: New - [place]  Subjective   Intubated and sedated.  Was agitated this morning with transient a-fib with RVR.  More calm now after receiving additional midazolam.  Inpatient Medications    Scheduled Meds:  chlorhexidine gluconate (MEDLINE KIT)  15 mL Mouth Rinse BID   Chlorhexidine Gluconate Cloth  6 each Topical Daily   docusate  100 mg Per Tube BID   DULoxetine  30 mg Oral Daily   DULoxetine  60 mg Oral Daily   enoxaparin (LOVENOX) injection  40 mg Subcutaneous Q24H   feeding supplement (PROSource TF)  45 mL Per Tube Daily   feeding supplement (VITAL AF 1.2 CAL)  1,000 mL Per Tube Q24H   free water  30 mL Per Tube Q4H   guaiFENesin  600 mg Oral BID   hydrocortisone sod succinate (SOLU-CORTEF) inj  50 mg Intravenous Q12H   insulin aspart  0-20 Units Subcutaneous Q4H   ipratropium-albuterol  3 mL Nebulization Q6H   lamoTRIgine  300 mg Per Tube BID   mouth rinse  15 mL Mouth Rinse 10 times per day   midodrine  5 mg Per Tube TID WC   multivitamin  15 mL Per Tube Daily   pantoprazole (PROTONIX) IV  40 mg Intravenous Q24H   polyethylene glycol  17 g Per Tube Daily   Continuous Infusions:  sodium chloride Stopped (02/17/21 1558)    ceFAZolin (ANCEF) IV Stopped (02/20/21 0057)   dexmedetomidine (PRECEDEX) IV infusion 1.2 mcg/kg/hr (02/20/21 0800)   fentaNYL infusion INTRAVENOUS 375 mcg/hr (02/20/21 0800)   furosemide (LASIX) 200 mg in dextrose 5% 100 mL (41m/mL) infusion 4 mg/hr (02/20/21 0800)   levETIRAcetam Stopped (02/20/21 0740)   norepinephrine (LEVOPHED) Adult infusion Stopped (02/18/21 1424)   potassium chloride 50 mL/hr at 02/20/21 0800   vasopressin Stopped (02/20/21 0406)   PRN Meds: acetaminophen (TYLENOL) oral liquid 160 mg/5 mL **OR** acetaminophen, fentaNYL, ipratropium-albuterol, midazolam, ondansetron **OR** ondansetron (ZOFRAN) IV   Vital Signs     Vitals:   02/20/21 0600 02/20/21 0700 02/20/21 0710 02/20/21 0800  BP: (!) 98/54 106/60  131/77  Pulse: 60 62  (!) 137  Resp: 17 (!) 21  (!) 26  Temp:      TempSrc:    Axillary  SpO2: 95% 96% 95% 95%  Weight:      Height:        Intake/Output Summary (Last 24 hours) at 02/20/2021 0852 Last data filed at 02/20/2021 0800 Gross per 24 hour  Intake 1886.12 ml  Output 2295 ml  Net -408.88 ml   Last 3 Weights 02/20/2021 02/14/2021 03/04/2021  Weight (lbs) 78 lb 0.7 oz 110 lb 0.2 oz 110 lb 0.2 oz  Weight (kg) 35.4 kg 49.9 kg 49.9 kg     CVP: 4  Telemetry    Sinus rhythm with PAC's.  Episode of a-fib and atrial flutter with HR's into the 130's from 0545->0819 today.  Back in sinus rhythm - Personally Reviewed  ECG    No new tracing  Physical Exam   GEN: Frail woman lying in bed.  She is intubated and sedated Neck: Unable to assess JVP due to positioning. Cardiac: RRR, no murmurs, rubs, or gallops.  Respiratory: Diminished breath sounds anteriorly. GI: Soft, nontender, non-distended  MS: No edema; No deformity. Neuro:  Sedated, moving head intermittently but does not respond.  Psych: Intubated and sedated.  Labs    High  Sensitivity Troponin:  No results for input(s): TROPONINIHS in the last 720 hours.   Chemistry Recent Labs  Lab 02/15/21 1154 02/16/21 0545 02/17/21 1422 02/17/21 2055 02/18/21 0332 02/19/21 0400 02/19/21 1030 02/20/21 0450  NA  --    < > 145 145 146* 143  --  146*  K  --    < > 2.9* 3.5 4.6 2.3* 3.0* 2.5*  CL  --    < > 94* 97* 100 91*  --  94*  CO2  --    < > 38* 36* 36* 41*  --  37*  GLUCOSE  --    < > 132* 173* 157* 262*  --  198*  BUN  --    < > 30* 29* 30* 28*  --  43*  CREATININE  --    < > 0.66 0.65 0.56 0.78  --  0.85  CALCIUM  --    < > 9.4 8.6* 8.1* 8.6*  --  8.8*  MG  --    < > 1.8  --  2.5* 1.9  --  2.1  PROT 5.8*  --  7.3 5.9*  --   --   --   --   ALBUMIN  --   --  3.5 2.9*  --   --   --   --   AST  --   --  16 18  --   --   --   --    ALT  --   --  13 12  --   --   --   --   ALKPHOS  --   --  76 58  --   --   --   --   BILITOT  --   --  1.0 1.1  --   --   --   --   GFRNONAA  --    < > >60 >60 >60 >60  --  >60  ANIONGAP  --    < > 13 12 10 11   --  15   < > = values in this interval not displayed.    Lipids  Recent Labs  Lab 02/16/21 0545  TRIG 51    Hematology Recent Labs  Lab 02/18/21 0332 02/19/21 0400 02/20/21 0450  WBC 8.6 10.5 17.2*  RBC 2.93* 2.59* 2.67*  HGB 8.5* 7.6* 7.8*  HCT 27.2* 23.7* 24.2*  MCV 92.8 91.5 90.6  MCH 29.0 29.3 29.2  MCHC 31.3 32.1 32.2  RDW 14.2 13.9 14.3  PLT 330 278 274   Thyroid No results for input(s): TSH, FREET4 in the last 168 hours.  BNP Recent Labs  Lab 02/15/21 0457 02/17/21 0440  BNP 963.0* 1,512.9*    DDimer  Recent Labs  Lab 02/15/21 0450  DDIMER 2.25*     Radiology    DG Abd 1 View  Result Date: 02/18/2021 CLINICAL DATA:  OG tube placement. EXAM: ABDOMEN - 1 VIEW COMPARISON:  Earlier same day FINDINGS: 1210 hours. OG tube tip is positioned in the distal stomach. Bowel gas pattern is nonspecific. Airspace disease noted in both lung bases. IMPRESSION: OG tube tip is positioned in the distal stomach. Electronically Signed   By: Misty Stanley M.D.   On: 02/18/2021 12:45   DG Abd 1 View  Result Date: 02/18/2021 CLINICAL DATA:  Check for aerophagia. Patient is placed on bipap and is complaining of abdominal pain and bloating. EXAM: ABDOMEN - 1 VIEW COMPARISON:  02/14/2021.  CT, 02/26/2019.  FINDINGS: Paucity of bowel gas. No significant gastric air. No evidence of bowel dilation or gastric distension. Soft tissues unremarkable. No acute skeletal abnormality. IMPRESSION: 1. No acute findings. No evidence of bowel obstruction. No bowel or gastric distention. Electronically Signed   By: Lajean Manes M.D.   On: 02/18/2021 10:28   DG Chest Port 1 View  Result Date: 02/18/2021 CLINICAL DATA:  Intubation. EXAM: PORTABLE CHEST 1 VIEW COMPARISON:  Earlier same day  FINDINGS: 1209 hours. Endotracheal tube tip is approximately 4.1 cm above the base of the carina. The NG tube passes into the stomach although the distal tip position is not included on the film. Right IJ central line tip overlies the mid SVC level. Asymmetric left greater than right airspace disease again noted, stable to minimally improved in the interval. Telemetry leads overlie the chest. IMPRESSION: 1. Endotracheal tube tip is approximately 4.1 cm above the base of the carina. 2. Stable to minimally improved asymmetric left greater than right airspace disease. Electronically Signed   By: Misty Stanley M.D.   On: 02/18/2021 12:41   DG Chest Port 1 View  Result Date: 02/18/2021 CLINICAL DATA:  Shortness of breath.  Breast cancer. EXAM: PORTABLE CHEST 1 VIEW COMPARISON:  02/18/2021 FINDINGS: Diffuse bilateral airspace disease is similar to prior. Cardiopericardial silhouette is at upper limits of normal for size. Right IJ central line tip overlies the mid to distal SVC. Telemetry leads overlie the chest. IMPRESSION: Stable exam. Diffuse bilateral airspace disease compatible with diffuse infection. Asymmetric edema is a less likely consideration by imaging. Electronically Signed   By: Misty Stanley M.D.   On: 02/18/2021 09:46    Cardiac Studies   TTE (02/15/2021):  1. Left ventricular ejection fraction, by estimation, is 40 to 45%. The  left ventricle has mildly decreased function. The left ventricle  demonstrates regional wall motion abnormalities (Severe hypokinesis of the  apical and large peri-apical region  involving distal anterior/inferior/septal/lateral walls). There is mild  left ventricular hypertrophy. The average left ventricular global  longitudinal strain is -13.3 %. The global longitudinal strain is  abnormal.   2. Right ventricular systolic function is normal. The right ventricular  size is normal.   3. The mitral valve is normal in structure. Mild mitral valve  regurgitation. No  evidence of mitral stenosis.   4. The aortic valve is normal in structure. Aortic valve regurgitation is  mild. No aortic stenosis is present.   5. The inferior vena cava is normal in size with greater than 50%  respiratory variability, suggesting right atrial pressure of 3 mmHg.  Patient Profile     61 y.o. female with h/o intellectual disability, hypertension, seizures, breast cancer who is being seen 02/18/2021 for the evaluation of heart failure in the setting of E. coli bacteremia.  Assessment & Plan    Acute HFrEF: Volume exam challenging but overall I do not see significant fluid retention.  CVP 4 this morning.  BUN trending up. - Discontinue furosemide infusion.  Reassess volume status, renal function, and CVP tomorrow to determine if reinitiation of diuresis is needed.  Paroxysmal atrial fibrillation: Likely precipitated by agitation in the setting of acute illness and marked hypokalemia.  Patient back in sinus rhythm with better sedation. - Replete electrolytes aggressively; goal K > 4.0 and Mg > 2.0. - Defer anticoagulation in the setting of severe anemia and relatively brief runs of atrial fibrillation. - Unable to add beta blocker at this time due to continued vasopressor-dependent shock.  Shock:  Most likely septic, though a component of cardiogenic shock in the setting of acute HFrEF also possible. - Treat underlying infection and wean pressors, as tolerated. - Hold furosemide infusion, as patient does not appear significantly volume overloaded at this time.  Would aim for net even fluid balance at this time. - If shock worsens, consider checking central venous oxygen saturation.  Addition of inotropic support may be helpful if SvO2 is < 70%.  However, this could increase risk for recurrent tachyarrhythmia and LVOT outflow obstruction with suspected stress-induced cardiomyopathy.  Acute HFrEF: Most likely stress-induced, though underlying ischemic substrate cannot be  excluded.  Patient is a poor candidate for ischemic evaluation.  She does not appear significantly volume overloaded at this time. - Hold furosemide infusion. - Unable to added GDMT at this time due to hypotension.  Severe anemia: Hemoglobin stable. - Defer anticoagulation. - Consider PRBC transfusion if hemoglobin drops further.    For questions or updates, please contact [place] Please consult [email or web address] for contact info under [facility].     Signed, Nelva Bush, MD  02/20/2021, 8:52 AM

## 2021-02-20 NOTE — Progress Notes (Signed)
Pt's caregiver requested a cup of ice water from [facility].

## 2021-02-20 NOTE — Progress Notes (Signed)
Chaplain Maggie made follow up visit to offer support. Family at bedside. Storytelling and grief support were central to this visit. Continued care available per on call chaplain.

## 2021-02-20 NOTE — Progress Notes (Addendum)
BRIEF PCCM NOTE   Patient continues to require high ventilator support (60% FiO2 and 8 of PEEP) despite treatment of pneumonia along with aggressive diuresis with Lasix drip for acute HFrEF.  Patient is very difficult to sedate, awake, tachypneic breathing over the vent with accessory muscle use, agitated and restless, and is suffering despite aggressive treatment measures.  Myself, Dr. Mortimer Fries, and Jordan Hawks NP with palliative care met with patient's mother at bedside to discuss critical illness poor prognosis superimposed on chronic health issues including cognitive disability.  We discussed that despite aggressive treatment, she has failed 1 trial of extubation and she continues to require high ventilatory support and is currently suffering.  Would not recommend pursuing tracheostomy and PEG tube placement as patient is very poor candidate due to her mental disability.  Patient's mother states that she does not want the patient to suffer any longer and request that we perform compassionate extubation with transition to comfort measures.  She request that we wait till her friend Rush Landmark arrives at bedside before performing compassionate extubation. She will let us know when she is ready to transition.  Plan: -DNR/DNI status ~nurse may pronounce death -Compassionate extubation with transition to comfort care measures once family friend arrives this afternoon -Dilaudid drip -As needed Ativan and Haldol ~ May possibly require benzodiazepine infusion    Additional Critical Care Time: 30 minutes  Darel Hong, AGACNP-BC [facility] Prefer epic messenger for cross cover needs If after hours, please call E-link

## 2021-02-20 NOTE — Consult Note (Signed)
Consultation Note Date: 02/20/2021   Patient Name: Helen Patton  DOB: Nov 25, 1960  MRN: [id number]  Age / Sex: 61 y.o., female  PCP: Jerrol Banana., MD Referring Physician: Flora Lipps, MD  Reason for Consultation: Establishing goals of care  HPI/Patient Profile: 61 y.o. female  with past medical history of mental retardation, HTN, seizures, and history of left lower breast cancer (ER positive) admitted on 02/18/2021 with a fall at her group home.  Patient presented with acute hypoxic respiratory failure.  1/4 she was intubated.  1/7 she was extubated.  1/8 she was reintubated due to severe pulmonary edema with new onset of CHF.  X-ray of chest reveals multifocal pneumonia.  Palliative medicine team was consulted to discuss goals of care.   Clinical Assessment and Goals of Care: I have reviewed medical records including EPIC notes, labs and imaging, assessed the patient and then met with Dr. Mortimer Fries, NP Dewaine Conger, and patient's mother Makilah to discuss diagnosis prognosis, GOC, EOL wishes, disposition and options.  I introduced Palliative Medicine as specialized medical care for people living with serious illness. It focuses on providing relief from the symptoms and stress of a serious illness. The goal is to improve quality of life for both the patient and the family.  We discussed a brief life review of the patient.  Patient had two siblings, one that passed at age 46 months due to a heart defect and one at age 66 with kidney failure. Patient's father passed several years ago. Patient is a long-term resident of [name].  Mother recalls patient enjoys doing puzzles and coloring.   As far as functional and nutritional status to admission patient was completely independent with all ADLs.  Patient was able to feed herself, bathe herself, dress herself, and ambulate independently.  We discussed  patient's current illness and what it means in the larger context of patient's on-going co-morbidities. I attempted to elicit values and goals of care important to the patient.  Mother shares that she would not want the patient to suffer.  The difference between aggressive medical intervention and comfort care was considered in light of the patient's goals of care. Education offered regarding concept specific to human mortality and the limitations of medical interventions to prolong life when the body begins to fail to thrive.  PAfter the medical update was given by CCM, patient's mother was in agreement with DNR and to allow natural death.  She would like for patient's longtime friend Abe People to be present for extubation.  One-way comfort extubation planned for this afternoon when family is ready.   Spiritual care visit offered and family accepted.  Questions and concerns were addressed. The family was encouraged to let the nurse or CCM provider know when she is ready for extubation.   Primary Decision Maker NEXT OF KIN  Code Status/Advance Care Planning: DNR One way comfort extubation planned for this afternoon when mother is ready Unrestricted visitor access Comfort measures  Prognosis:   Hours - Days  Discharge  Planning: Anticipated Hospital Death  Primary Diagnoses: Present on Admission:  Severe sepsis with acute organ dysfunction (HCC)  Breast cancer ([place])  Anxiety  Arthralgia of hip  Cognitive decline  Clinical depression  Acid reflux  HLD (hyperlipidemia)  Methicillin resistant Staphylococcus aureus infection  Cerebral palsy (HCC)  Acute respiratory failure with hypoxia ([place])   Physical Exam Vitals and nursing note reviewed.  Constitutional:      General: She is in acute distress.     Appearance: She is ill-appearing.  HENT:     Head: Normocephalic and atraumatic.     Mouth/Throat:     Mouth: Mucous membranes are dry.  Cardiovascular:     Rate and Rhythm: Normal  rate.  Pulmonary:     Comments: MV Abdominal:     Palpations: Abdomen is soft.  Musculoskeletal:     Comments: sedated  Skin:    General: Skin is warm and dry.  Neurological:     Comments: Non-verbal at baseline, non-verbal on MV and sedated    Vital Signs: BP (!) 144/70    Pulse 82    Temp 98.2 F (36.8 C) (Axillary)    Resp (!) 22    Ht 4' 11.02" (1.499 m)    Wt 35.4 kg    SpO2 91%    BMI 15.75 kg/m  Pain Scale: CPOT   Pain Score: 0-No pain SpO2: SpO2: 91 % O2 Device:SpO2: 91 % O2 Flow Rate: .O2 Flow Rate (L/min): 50 L/min  Palliative Assessment/Data: 20%     I discussed this patient's plan of care with Dr. Mortimer Fries, NP Dewaine Conger, RN Clarene Critchley, patient's mother Ghadeer.  Thank you for this consult. Palliative medicine will continue to follow and assist holistically.   Time Total: 70 minutes Greater than 50%  of this time was spent counseling and coordinating care related to the above assessment and plan.  Signed by: Jordan Hawks, DNP, FNP-BC Palliative Medicine    Please contact Palliative Medicine Team phone at [phone] for questions and concerns.  For individual provider: See Shea Evans

## 2021-02-20 NOTE — Progress Notes (Signed)
NAME:  LAYCI STENGLEIN, MRN:  [id number], DOB:  Sep 23, 1960, LOS: 7 ADMISSION DATE:  02/17/2021, CONSULTATION DATE:  02/14/2021 REFERRING MD:  Sharion Settler, NP  CHIEF COMPLAINT: SOB   Brief Pt Description / Synopsis  61 y.o. Female admitted with Acute Hypoxic & Hypercapnic Respiratory Failure in the setting of multifocal pneumonia and pulmonary edema requiring intubation and mechanical ventilation.  Also found to have E.coli UTI.  HPI  62 y.o  female with significant PMH of learning and intellectual disability, hypertension, seizures, cognitive decline, history of left lower breast cancer ER positive, who presents emergency department for chief concerns of a fall and imbalance.  ED Course: On arrival to the ED, she was afebrile with heart rate 80 beats/minute, the blood pressure  84/40 mm Hg, the respiratory rate 16 breaths/minute, and the oxygen saturation 95%  on 4L.  An electrocardiogram showed rhythm, and nonspecific ST-T-wave changes. Pertinent Labs /Diagnostics Findings: serum sodium of 135, potassium 4.7, chloride 102, bicarb 27, BUN of 24, serum creatinine of 0.88, nonfasting blood glucose 135, WBC elevated at 17.5, hemoglobin 10.1, platelets of 266.  Lactic acid was not elevated at 1.9.  COVID/influenza A/influenza B PCR were negative. UA was positive for small leukocytes, and nitrates is positive.. Chest xray concerning for multifocal pneumonia.  Patient received 1L bolus NS and was treated with ceftriaxone and Azithromycin. She was admitted to hospital service.  Hospital Course. While holding in the ED pending admission to stepdown, patient was noted with worsening hypoxia with increased oxygen requirement. She desaturated to mid 70s and was placed on [facility] M with minimal improvement. She eventually required BiPAP however was noted to have increased work of breathing with RR in the mid 30's. ABG was obtained and showed PAO2 of 56. Chest x-ray ordered; impression read as worsening infection  or edema.  20 mg IV furosemide administered and PCCM consulted due to high risk for intubation. Patient was transferred to the ICU and intubated upon arrival.  Past Medical History   Anxiety, Arthritis, Breast cancer ([place]) (2015), Cancer ([place]) (11/2013), Cataract, Depression, Difficulty swallowing, Falls, GERD (gastroesophageal reflux disease), Hidradenitis (2012), History of hiatal hernia, Hyperlipidemia, Hypertension, Mental retardation, MRSA (methicillin resistant staph aureus) culture positive (2012), Osteoporosis, Pneumonia, and Seizures ([place]).  Significant Hospital Events   1/3: Admitted to hospitalist service with acute hypoxic respiratory failure 1/4: Transferred to the ICU for worsening respiratory status requiring intubation.  PCCM consulted 02/16/21- patient is severe critically ill  02/17/21-  extubated today  02/18/21- patient with severe pulm edema, REINTUBATED.  S/p CXR and repeat ABG. S/p TTE.  Cardio consultation placed for new onset CHF. Place on Lasix gtt 02/19/21: Continuing Lasix gtt per Cardiology, diuresing well, FiO2 decreased to 60% on vent 02/20/21: Remains on vent support: 60% FiO2 & 8 Peep ~ wean as tolerated.  Lasix gtt d/c by Cardiology. New Leukocytosis today of 17.2 K (10.5) ~ pt is afebrile, will check Procalcitonin with low threshold to re-culture and initiate ABX  Consults:  PCCM  Procedures:  1/4: intubation (extubated 1/7) 1/8: Reintubation  Significant Diagnostic Tests:  1/4: Chest Xray>Marked progression of bilateral airspace disease consistent with worsening infection or edema. 2. Small bilateral pleural effusions, increased since prior study. 1/4: CT head: 1. No evidence of acute intracranial abnormality. 2. Mild chronic small vessel ischemic changes within the cerebral white matter. 3. Mild cerebral atrophy. 4. As before, there is partial opacification of the right ethmoid air cells with nonspecific polypoid soft tissue extending from this region  into  the right nasal passage. Associated thinned appearance of the lateral lamella of the cribriform plate on the right. Dehiscence at this site cannot be excluded, and a non-emergent maxillofacial CT is recommended for further evaluation. 5. Right greater than left maxillary sinus disease at the imaged levels, as described. 1/4 CT cervical spine: 1. No evidence of acute fracture to the cervical spine. 2. Straightening of the expected cervical lordosis. 3. Partially imaged right pleural effusion 1/5: CTA Chest: 1. No evidence of pulmonary embolism. 2. Near-complete consolidation in the bilateral lower lobes with scattered additional patchy opacities throughout the remainder of the lungs and moderate bilateral pleural effusions consistent with multifocal pneumonia.  Micro Data:  1/4: SARS-CoV-2 PCR> negative 1/4: Influenza PCR> negative 1/4: Blood culture x2> no growth 1/4: Urine Culture> E.coli (pan sensitive) 1/4: MRSA PCR>> not dectected 1/4: Strep pneumo urinary antigen>negative 1/4: Legionella urinary antigen>negative 1/5: Tracheal aspirate >>normal respiratory flora 1/5: RVP>> negative  Antimicrobials:  Azithromycin 1/4>1/5 Ceftriaxone 1/4>1/5 Metronidazole 1/4>x1 Vancomycin 1/5>1/5 Cefepime 1/5>1/5 Cefazolin 1/8>>1/10  OBJECTIVE  Blood pressure 106/60, pulse 62, temperature 99.4 F (37.4 C), temperature source Oral, resp. rate (!) 21, height 4' 11.02" (1.499 m), weight 35.4 kg, SpO2 95 %. CVP:  [4 mmHg-11 mmHg] 4 mmHg  Vent Mode: PRVC FiO2 (%):  [30 %-80 %] 60 % Set Rate:  [18 bmp] 18 bmp Vt Set:  [450 mL] 450 mL PEEP:  [8 cmH20] 8 cmH20 Plateau Pressure:  [23 cmH20-24 cmH20] 23 cmH20   Intake/Output Summary (Last 24 hours) at 02/20/2021 0677 Last data filed at 02/20/2021 0340 Gross per 24 hour  Intake 1685.96 ml  Output 2295 ml  Net -609.04 ml    Filed Weights   02/14/21 2019 02/20/21 0455  Weight: 49.9 kg 35.4 kg   Physical Examination  GENERAL: 61 year-old  critically ill patient lying in the bed intubated and sedated EYES: Pupils equal, round, reactive to light and accommodation. No scleral icterus. Extraocular muscles intact.  HEENT: Head atraumatic, normocephalic. Oropharynx and nasopharynx clear.  NECK:  Supple, no jugular venous distention. No thyroid enlargement, no tenderness.  LUNGS: mechanical breath sounds bilaterally, no wheezing, Overbreathes the ventilator,even,  No use of accessory muscles of respiration.  CARDIOVASCULAR: S1, S2 normal. No murmurs, rubs, or gallops.  ABDOMEN: Soft, nontender, nondistended. Bowel sounds present. No organomegaly or mass.  EXTREMITIES: No pedal edema, cyanosis, or clubbing.  NEUROLOGIC: Sedated (RASS -2 currently), withdraws from pain, pupils PERRL  SKIN: No obvious rash, lesion, or ulcer.   Labs/imaging that I havepersonally reviewed  (right click and "Reselect all SmartList Selections" daily)     Labs   CBC: Recent Labs  Lab 02/14/21 0614 02/15/21 0457 02/16/21 0545 02/17/21 0440 02/18/21 0332 02/19/21 0400 02/20/21 0450  WBC 13.7*   < > 18.5* 13.6* 8.6 10.5 17.2*  NEUTROABS 10.0*  --   --   --   --   --   --   HGB 10.0*   < > 8.6* 8.7* 8.5* 7.6* 7.8*  HCT 31.4*   < > 27.3* 27.6* 27.2* 23.7* 24.2*  MCV 94.3   < > 94.5 94.2 92.8 91.5 90.6  PLT 299   < > 344 362 330 278 274   < > = values in this interval not displayed.     Basic Metabolic Panel: Recent Labs  Lab 02/17/21 0440 02/17/21 1422 02/17/21 2055 02/18/21 0332 02/19/21 0400 02/19/21 1030 02/20/21 0450  NA 140 145 145 146* 143  --  146*  K  4.5 2.9* 3.5 4.6 2.3* 3.0* 2.5*  CL 102 94* 97* 100 91*  --  94*  CO2 32 38* 36* 36* 41*  --  37*  GLUCOSE 146* 132* 173* 157* 262*  --  198*  BUN 29* 30* 29* 30* 28*  --  43*  CREATININE 0.71 0.66 0.65 0.56 0.78  --  0.85  CALCIUM 8.9 9.4 8.6* 8.1* 8.6*  --  8.8*  MG 2.4 1.8  --  2.5* 1.9  --  2.1  PHOS 3.4 3.7 3.1 2.6 2.4*  --  4.6    GFR: Estimated Creatinine Clearance:  38.4 mL/min (by C-G formula based on SCr of 0.85 mg/dL). Recent Labs  Lab 02/23/2021 1704 03/08/2021 2245 02/14/21 0614 02/15/21 0457 02/17/21 0440 02/18/21 0332 02/19/21 0400 02/20/21 0450  PROCALCITON  --  0.42 <0.10  --   --   --   --   --   WBC  --   --  13.7*   < > 13.6* 8.6 10.5 17.2*  LATICACIDVEN 1.9  --   --   --   --   --   --   --    < > = values in this interval not displayed.     Liver Function Tests: Recent Labs  Lab 02/15/21 1154 02/17/21 1422 02/17/21 2055  AST  --  16 18  ALT  --  13 12  ALKPHOS  --  76 58  BILITOT  --  1.0 1.1  PROT 5.8* 7.3 5.9*  ALBUMIN  --  3.5 2.9*    No results for input(s): LIPASE, AMYLASE in the last 168 hours. No results for input(s): AMMONIA in the last 168 hours.  ABG    Component Value Date/Time   PHART 7.52 (H) 02/18/2021 1441   PCO2ART 44 02/18/2021 1441   PO2ART 68 (L) 02/18/2021 1441   HCO3 35.9 (H) 02/18/2021 1441   ACIDBASEDEF 0.9 02/14/2021 2216   O2SAT 95.1 02/18/2021 1441      Coagulation Profile: Recent Labs  Lab 02/14/21 0614  INR 1.0     Cardiac Enzymes: No results for input(s): CKTOTAL, CKMB, CKMBINDEX, TROPONINI in the last 168 hours.  HbA1C: Hgb A1c MFr Bld  Date/Time Value Ref Range Status  02/16/2021 05:45 AM 5.2 4.8 - 5.6 % Final    Comment:    (NOTE) Pre diabetes:          5.7%-6.4%  Diabetes:              >6.4%  Glycemic control for   <7.0% adults with diabetes   02/15/2021 04:57 AM 5.1 4.8 - 5.6 % Final    Comment:    (NOTE) Pre diabetes:          5.7%-6.4%  Diabetes:              >6.4%  Glycemic control for   <7.0% adults with diabetes     CBG: Recent Labs  Lab 02/19/21 1553 02/19/21 2018 02/19/21 2317 02/20/21 0453 02/20/21 0716  GLUCAP 172* 162* 152* 196* 94     Review of Systems:   UNABLE TO OBTAIN PATIENT IS INTUBATED  Past Medical History  She,  has a past medical history of Anxiety, Arthritis, Breast cancer ([place]) (2015), Cancer ([place]) (11/2013),  Cataract, Depression, Difficulty swallowing, Falls, GERD (gastroesophageal reflux disease), Hidradenitis (2012), History of hiatal hernia, Hyperlipidemia, Hypertension, Mental retardation, MRSA (methicillin resistant staph aureus) culture positive (2012), Osteoporosis, Pneumonia, and Seizures ([place]).   Surgical History  Past Surgical History:  Procedure Laterality Date   BREAST LUMPECTOMY Left 12/07/2013   T1b, NX, ER/PR positive, HER-2/neu not overexpressed, 7 mm.  Not candidate for chemotherapy/sentinel node/radiation based on mental retardation.   BREAST SURGERY Left 12/07/2013   T1b, Nx; ER+, PR+, her 2 not amplified. 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 She has never used smokeless tobacco. She reports that she does not drink alcohol and does not use drugs.   Family History   Her family history includes Arthritis in her mother; Autoimmune disease in her brother; Breast cancer in her maternal aunt; Cancer in her paternal grandmother; Cancer (age of onset: 74) in her maternal aunt; Diabetes in her father and mother; Hypertension in her maternal grandmother; Neuropathy in her mother; Parkinson's disease in her father.   Allergies Allergies  Allergen Reactions   Sulfa Antibiotics Other (See Comments)    "Does not know"   Keflex [Cephalexin] Other (See Comments)    "Does not know" unknown      Home Medications  Prior to Admission medications   Medication Sig Start Date End Date Taking? Authorizing Provider  alendronate (FOSAMAX) 70 MG tablet TAKE 1 TABLET  BY MOUTH WEEKLY EARLY MORNING BEFORE FOOD/MEDS WITH WATER. DO NOT LIE DOWN FOR 30 MINUTES 09/03/18  Yes Jerrol Banana., MD  amLODipine (NORVASC) 10 MG tablet TAKE 1 TABLET BY MOUTH AT BEDTIME 09/03/18  Yes Jerrol Banana., MD  BENZOYL PEROXIDE 5 % external wash USE TOPICALLY ONCE DAILY 10/27/19  Yes Jerrol Banana., MD  calcium-vitamin D (OSCAL WITH D) 500-200 MG-UNIT TABS tablet TAKE 1 TABLET BY MOUTH EVERY DAY WITH BREAKFAST 11/03/18  Yes Jerrol Banana., MD  cetirizine (ZYRTEC) 10 MG tablet Take 1 tablet (10 mg total) by mouth daily. 12/23/18  Yes Jerrol Banana., MD  chlorhexidine (PERIDEX) 0.12 % solution Use as directed 15 mLs in the mouth or throat 2 (two) times daily.   Yes [provider]  cyanocobalamin (,VITAMIN B-12,) 1000 MCG/ML injection INJECT 1ML=1000MCG INTRAMUSCULARLY EVERY MONTH (B-12 SUPPLEMENT) 10/02/20  Yes Jerrol Banana., MD  docusate sodium (COLACE) 100 MG capsule TAKE 1 CAPSULE BY MOUTH 2 TIMES PER DAY 09/03/18  Yes Jerrol Banana., MD  DULoxetine (CYMBALTA) 30 MG capsule Take 30 mg by mouth daily.   Yes [provider]  DULoxetine (CYMBALTA) 60 MG capsule Take 1 capsule (60 mg total) by mouth daily. 04/10/18  Yes Jerrol Banana., MD  ipratropium (ATROVENT) 0.03 % nasal spray Place 2 sprays into both nostrils 3 (three) times daily. 02/24/18  Yes Bacigalupo, [name], MD  lamoTRIgine (LAMICTAL) 150 MG tablet TAKE 2 TABLETS BY MOUTH 2 TIMES PER DAY 09/03/18  Yes Jerrol Banana., MD  LORazepam (ATIVAN) 1 MG tablet Take 1 tablet by  mouth every morning, and 1/2 tablet at noon and bedtime. Patient taking differently: Take 0.5-1 mg by mouth every 8 (eight) hours as needed for anxiety or sleep. Take 1 mg (one tablet) by mouth every morning and  0.5 mg (one-half tablet) at noon and at bedtime 12/03/18  Yes Jerrol Banana., MD  losartan (COZAAR) 50 MG tablet TAKE 2 TABLETS (100 MG) BY MOUTH ONCE DAILY 04/11/20  Yes Jerrol Banana., MD  pantoprazole (PROTONIX) 40 MG tablet TAKE 1 TABLET BY MOUTH 2 TIMES PER DAY **DO NOT CRUSH** 12/23/18  Yes Jerrol Banana., MD  senna (SENOKOT) 8.6 MG TABS tablet Take 1 tablet (8.6 mg total) by mouth 2 (two) times daily. 03/01/19  Yes Lavina Hamman, MD  acetaminophen (TYLENOL) 325 MG tablet Take 2 tablets (650 mg total) by mouth every 6 (six) hours as needed. 07/14/17   Jerrol Banana., MD  BD ECLIPSE SYRINGE 25G X 1" 3 ML MISC FOR USE WITH B12 10/02/20   Jerrol Banana., MD  clotrimazole (LOTRIMIN) 1 % cream APPLY TOPICALLY TWICE A DAY AS NEEDED FOR RASH IN SKIN FOLDS 02/05/17   Jerrol Banana., MD  diphenhydrAMINE (BENADRYL) 12.5 MG chewable tablet Chew 1 tablet (12.5 mg total) by mouth 3 (three) times daily. 04/02/18   Jerrol Banana., MD  guaiFENesin-dextromethorphan [facility] DM) 100-10 MG/5ML syrup Take 5 mLs by mouth every 4 (four) hours as needed for cough. 02/07/21   Gwyneth Sprout, FNP  letrozole [facility]) 2.5 MG tablet TAKE 1 TABLET BY MOUTH ONCE DAILY *HAZARDOUS DRUG: WEAR GLOVES* Patient not taking: Reported on 10/17/2020 09/25/19   Jerrol Banana., MD  levETIRAcetam (KEPPRA) 250 MG tablet Take 1 tablet (250 mg total) by mouth 2 (two) times daily for 2 days. Then stop. Patient not taking: No sig reported 03/01/19 03/03/19  Lavina Hamman, MD  LORazepam (ATIVAN) 0.5 MG tablet Take 1.5 mg by mouth as directed. Take 1.5 mg (three tablets) by mouth one hour prior to dental appointment    [provider]  ondansetron (ZOFRAN ODT) 4 MG disintegrating tablet Take 1 tablet (4 mg total) by mouth every 8 (eight) hours as needed for nausea or vomiting. 06/06/20   Brunetta Jeans, PA-C  sodium chloride (OCEAN) 0.65 % SOLN nasal spray Place 1 spray into  both nostrils as needed for congestion. 02/07/21   Gwyneth Sprout, FNP  terbinafine (LAMISIL) 1 % cream USE 2 TIMES A WEEK AS NEEDED 01/22/21   Birdie Sons, MD  THERAPEUTIC SHAMPOO 0.5 % shampoo APPLY TOPICALLY AT BEDTIME AS NEEDED 11/16/20   Jerrol Banana., MD  triamcinolone (KENALOG) 0.025 % ointment APPLY 1 APPLICATION TOPICALLY TWICE A DAY AS NEEDED 01/22/21   Birdie Sons, MD    Scheduled Meds:  chlorhexidine gluconate (MEDLINE KIT)  15 mL Mouth Rinse BID   Chlorhexidine Gluconate Cloth  6 each Topical Daily   docusate  100 mg Per Tube BID   DULoxetine  30 mg Oral Daily   DULoxetine  60 mg Oral Daily   enoxaparin (LOVENOX) injection  40 mg Subcutaneous Q24H   feeding supplement (PROSource TF)  45 mL Per Tube Daily   feeding supplement (VITAL AF 1.2 CAL)  1,000 mL Per Tube Q24H   free water  30 mL Per Tube Q4H   guaiFENesin  600 mg Oral BID   hydrocortisone sod succinate (SOLU-CORTEF) inj  50 mg Intravenous Q12H  insulin aspart  0-20 Units Subcutaneous Q4H   ipratropium-albuterol  3 mL Nebulization Q6H   lamoTRIgine  300 mg Per Tube BID   mouth rinse  15 mL Mouth Rinse 10 times per day   midodrine  5 mg Per Tube TID WC   multivitamin  15 mL Per Tube Daily   pantoprazole (PROTONIX) IV  40 mg Intravenous Q24H   polyethylene glycol  17 g Per Tube Daily   Continuous Infusions:  sodium chloride Stopped (02/17/21 1558)    ceFAZolin (ANCEF) IV Stopped (02/20/21 0057)   dexmedetomidine (PRECEDEX) IV infusion 1.2 mcg/kg/hr (02/20/21 3235)   fentaNYL infusion INTRAVENOUS 375 mcg/hr (02/20/21 5732)   furosemide (LASIX) 200 mg in dextrose 5% 100 mL (62m/mL) infusion 4 mg/hr (02/20/21 02025   levETIRAcetam 500 mg (02/20/21 0725)   norepinephrine (LEVOPHED) Adult infusion Stopped (02/18/21 1424)   potassium chloride 10 mEq (02/20/21 0734)   vasopressin Stopped (02/20/21 0406)   PRN Meds:.acetaminophen (TYLENOL) oral liquid 160 mg/5 mL **OR** acetaminophen, fentaNYL,  ipratropium-albuterol, midazolam, ondansetron **OR** ondansetron (ZOFRAN) IV  Assessment & Plan:   Acute Hypoxic / Hypercapnic Respiratory Failure secondary to multifocal pneumonia and pulmonary edema -Full vent support, implement lung protective strategies -Plateau pressures less than 30 cm H20 -Wean FiO2 & PEEP as tolerated to maintain O2 sats >92% -Follow intermittent Chest X-ray & ABG as needed -Spontaneous Breathing Trials when respiratory parameters met and mental status permits -Implement VAP Bundle -Prn Bronchodilators -Completed course of ABX -Diuresis as BP and renal function permits as per Cardiology ~ Lasix gtt discontinued 1/10  Septic Shock Acute Decompensated HFrEF (EF 40-45%) -Continuous cardiac monitoring -Maintain MAP >65 -Vasopressors as needed to maintain MAP goal ~ currently requiring Vasopressin -Lactic acid has normalized -Trend HS Troponin until peaked -Echocardiogram 02/15/21: LVEF 40-45%, indeterminate diastolic parameters, RV function normal -Diuresis as BP and renal function permits per Cardiology ~ Lasix gtt d/c on 1/19  Septic due to multifocal pneumonia & E.coli UTI ~ TREATED New Leukocytosis PMHx: MRSA (methicillin resistant staph aureus) culture positive (2012), recurrent pneumonia -Monitor fever curve -Trend WBC's  -Follow cultures as above -Completed course of ABX -Pt with new Leukocytosis today 1/10 of 17.2 K (10.5) ~ pt is afebrile, will check Procalcitonin with low threshold to re-culture and initiate ABX  Acute Metabolic Encephalopathy in setting of Sepsis Sedation needs in the setting of mechanical ventilation History of Seizures, Anxiety, Depression -Maintain a RASS goal of 0 to -1 -Change to Dilaudid and PRN versed pushes to maintain RASS goal -Avoid sedating medications as able -Daily wake up assessment -Continue Lamotrigine and IV Keppra -PRN Lorazepam for breakthrough seizures -Seizure precautions  Hypokalemia Mild  Hypernatremia -Monitor I&O's / urinary output -Follow BMP -Ensure adequate renal perfusion -Avoid nephrotoxic agents as able -Replace electrolytes as indicated -Pharmacy consulted to assist with electrolyte replacement  Normocytic Normochromic Anemia without s/sx of over bleeding -Monitor for S/Sx of bleeding -Trend CBC -Lovenox for VTE Prophylaxis  -Transfuse for Hgb <7  Hyperglycemia -CBG's q4h; Target range of 140 to 180 -SSI -Follow ICU Hypo/Hyperglycemia protocol      Best practice:  Diet:  NPO, tube feeds Pain/Anxiety/Delirium protocol (if indicated): Yes, RASS goal of 0 to -1 VAP protocol (if indicated): Yes DVT prophylaxis: LMWH GI prophylaxis: PPI Glucose control:  SSI Yes Central venous access:  yes and still needed Arterial line:  N/A Foley:  Yes, and it is still needed Mobility:  bed rest  PT consulted: N/A Last date of multidisciplinary goals of care discussion [  02/20/21] Code Status:  full code Disposition: ICU  Pt is critically ill, prognosis is guarded.  Recommend DNR/DNI status with 1 way extubation once medically optimized with transition to comfort measures if does not tolerate extubation.  Given pt's mental disability, feel she would be a poor candidate for Tracheostomy and PEG.  Palliative Care consulted to assist with goals of care.  Will speak with pt's mother when she comes to bedside today.   Critical care time: 40 minutes     Darel Hong, AGACNP-BC  Pulmonary & Critical Care Prefer epic messenger for cross cover needs If after hours, please call E-link

## 2021-02-21 DIAGNOSIS — F419 Anxiety disorder, unspecified: Secondary | ICD-10-CM

## 2021-02-21 DIAGNOSIS — A419 Sepsis, unspecified organism: Secondary | ICD-10-CM | POA: Diagnosis not present

## 2021-02-21 DIAGNOSIS — R569 Unspecified convulsions: Secondary | ICD-10-CM

## 2021-02-21 DIAGNOSIS — J9601 Acute respiratory failure with hypoxia: Secondary | ICD-10-CM | POA: Diagnosis not present

## 2021-02-21 DIAGNOSIS — J189 Pneumonia, unspecified organism: Secondary | ICD-10-CM | POA: Diagnosis not present

## 2021-02-21 MED ORDER — MORPHINE 100MG IN NS 100ML (1MG/ML) PREMIX INFUSION: 2.0000 mg/h | INTRAVENOUS | Status: DC

## 2021-02-27 ENCOUNTER — Ambulatory Visit: Payer: Medicare Other | Admitting: Family Medicine

## 2021-03-07 LAB — BLOOD GAS, ARTERIAL
Acid-Base Excess: 11.9 mmol/L — ABNORMAL HIGH (ref 0.0–2.0)
Bicarbonate: 35.9 mmol/L — ABNORMAL HIGH (ref 20.0–28.0)
FIO2: 1
MECHVT: 450 mL
O2 Saturation: 95.1 %
PEEP: 10 cmH2O
Patient temperature: 37
RATE: 18 resp/min
pCO2 arterial: 44 mmHg (ref 32.0–48.0)
pH, Arterial: 7.52 — ABNORMAL HIGH (ref 7.350–7.450)
pO2, Arterial: 68 mmHg — ABNORMAL LOW (ref 83.0–108.0)

## 2021-03-14 NOTE — Progress Notes (Signed)
Chaplain Maggie made a follow up visit with family at bedside to offer spiritual and bereavement support. Prayer and hospitality were central to this visitation. Pt passed during the visit.

## 2021-03-14 NOTE — Death Summary Note (Incomplete)
Patient passed at 10:36. Spouse and friends at bedside. Honor bridge and Dr Maylene Roes notified.  Patient pronounced by Aliene Beams RN.

## 2021-03-14 NOTE — Progress Notes (Signed)
Palliative Care Progress Note, Assessment & Plan   Patient Name: Helen Patton       Date: 02/25/21 DOB: 01-Jun-1960  Age: 61 y.o. MRN#: [id number] Attending Physician: Dessa Phi, DO Primary Care Physician: Jerrol Banana., MD Admit Date: 03/05/2021  Reason for Consultation/Follow-up: Non pain symptom management, Psychosocial/spiritual support, and Terminal Care  Subjective: Patient is lying in bed in no apparent distress.  Nasal cannula in place.  Patient is full comfort measures.  Mother is at bedside.  HPI: 61 y.o. female  with past medical history of mental retardation, HTN, seizures, and history of left lower breast cancer (ER positive) admitted on 03/11/2021 with a fall at her group home.  Patient presented with acute hypoxic respiratory failure.  1/4 she was intubated.  1/7 she was extubated.  1/8 she was reintubated due to severe pulmonary edema with new onset of CHF.  X-ray of chest reveals multifocal pneumonia.  After discussion with CCM on 1/10 mother made decision to move to full comfort care.  One-way comfort extubation performed afternoon of 1/10.   Summary of counseling/coordination of care: After reviewing the patient's chart, I assessed the patient at bedside.  She is in no respiratory distress and appears comfortable on 4 mg of Dilaudid GTT for comfort measures only. Her extremities remain warm. He respirations are very shallow but not labored.  Mother is at bedside.  Therapeutic silence and active listening offered for her mother during this end-of-life transition.  Mother was appropriately tearful and requested that music be played for her and her daughter. I set up bedside computer to play music in the background.  Human mortality and failure to thrive discussed with mother.   She shared her concerns with not knowing what the hereafter holds for her daughter.  She also shared she thought she, at 26, would die first.   Spiritual care visit from chaplain offered.  Mother said she would love to pray with anyone who was willing to.  Palliative medicine team will continue to follow the patient throughout her hospitalization.  Code Status: DNR  Prognosis: Hours - Days  Discharge Planning: Anticipated Hospital Death  Recommendations/Plan: Comfort measures only DNR remains Spiritual care to visit with patient and mother again today  Care plan was discussed with patient, patient's mother  Physical Exam Vitals and nursing note reviewed.  Constitutional:      General: She is not in acute distress. HENT:     Head: Normocephalic and atraumatic.     Mouth/Throat:     Mouth: Mucous membranes are moist.  Cardiovascular:     Rate and Rhythm: Bradycardia present.  Pulmonary:     Comments: Shallow, unlabored breathing Musculoskeletal:     Cervical back: Normal range of motion.  Skin:    General: Skin is warm.  Neurological:     Comments: nonverbal            Palliative Assessment/Data: 10%    Total Time 25 minutes  Greater than 50%  of this time was spent counseling and coordinating care related to the above assessment and plan.  Thank you for allowing the Palliative Medicine Team to assist in the care of this patient.  Curt Bears  Myrtie Hawk, DNP, FNP-BC Palliative Medicine Team Team Phone # [phone]

## 2021-03-14 NOTE — Progress Notes (Signed)
°  PROGRESS NOTE  Patient seen and examined this morning. Patient's mother at bedside. Patient is comfort measures only. I was notified by RN that patient expired 10:36am Mar 08, 2021.    Dessa Phi, DO [facility] 2021/03/08, 11:31 AM  Available via Epic secure chat 7am-7pm After these hours, please refer to coverage provider listed on [email or web address]

## 2021-03-14 NOTE — Death Summary Note (Signed)
DEATH SUMMARY   Patient Details  Name: Helen Patton MRN: [id number] DOB: May 01, 1960  Admission/Discharge Information   Admit Date:  2021/02/22  Date of Death: Date of Death: March 02, 2021  Time of Death: Time of Death: 05/06/34  Length of Stay: 8  Referring Physician: Jerrol Banana., MD   Reason(s) for Hospitalization  Acute Hypoxic & Hypercapnic Respiratory Failure Acute Decompensated HFrEF Septic Shock E. Coli Urinary Tract Infection Suspected Multifocal Pneumonia Acute Metabolic Encephalopathy Hypokalemia Normocytic Normochromic Anemia Hyperglycemia  Diagnoses  Preliminary cause of death:  Acute Hypoxic & Hypercapnic Respiratory Failure Secondary Diagnoses (including complications and co-morbidities):  Principal Problem:   Severe sepsis with acute organ dysfunction (HCC) Active Problems:   Intellectual disability   Breast cancer (HCC)   Anxiety   Arthralgia of hip   Cerebral palsy (HCC)   Cognitive decline   Clinical depression   Acid reflux   HLD (hyperlipidemia)   Methicillin resistant Staphylococcus aureus infection   Seizure (HCC)   S/P total hip arthroplasty   Leukocytosis   Acute respiratory failure with hypoxia [facility])   Brief Hospital Course (including significant findings, care, treatment, and services provided and events leading to death)  Helen Patton is a 61 y.o  female with significant PMH of learning and intellectual disability, hypertension, seizures, cognitive decline, history of left lower breast cancer ER positive, who presents emergency department for chief concerns of a fall and imbalance.   ED Course: On arrival to the ED, she was afebrile with heart rate 80 beats/minute, the blood pressure  84/40 mm Hg, the respiratory rate 16 breaths/minute, and the oxygen saturation 95%  on 4L.  An electrocardiogram showed rhythm, and nonspecific ST-T-wave changes. Pertinent Labs /Diagnostics Findings: serum sodium of 135, potassium 4.7, chloride  102, bicarb 27, BUN of 24, serum creatinine of 0.88, nonfasting blood glucose 135, WBC elevated at 17.5, hemoglobin 10.1, platelets of 266.  Lactic acid was not elevated at 1.9.  COVID/influenza A/influenza B PCR were negative. UA was positive for small leukocytes, and nitrates is positive.. Chest xray concerning for multifocal pneumonia.  Patient received 1L bolus NS and was treated with ceftriaxone and Azithromycin. She was admitted to hospital service.   Hospital Course. While holding in the ED pending admission to stepdown, patient was noted with worsening hypoxia with increased oxygen requirement. She desaturated to mid 70s and was placed on [facility] M with minimal improvement. She eventually required BiPAP however was noted to have increased work of breathing with RR in the mid 30's. ABG was obtained and showed PAO2 of 56. Chest x-ray ordered; impression read as worsening infection or edema.  20 mg IV furosemide administered and PCCM consulted due to high risk for intubation. Patient was transferred to the ICU and intubated upon arrival.  Please see "Significant Hospital Events" section below for full detailed Hospital Course.   Significant Hospital Events  1/3: Admitted to hospitalist service with acute hypoxic respiratory failure 1/4: Transferred to the ICU for worsening respiratory status requiring intubation.  PCCM consulted 02/16/21- patient is severe critically ill  02/17/21-  extubated today  02/18/21- patient with severe pulm edema, REINTUBATED.  S/p CXR and repeat ABG. S/p TTE.  Cardio consultation placed for new onset CHF. Place on Lasix gtt 02/19/21: Continuing Lasix gtt per Cardiology, diuresing well, FiO2 decreased to 60% on vent 02/20/21: Remains on vent support: 60% FiO2 & 8 Peep ~ wean as tolerated.  Lasix gtt d/c by Cardiology. New Leukocytosis today of 17.2 K (10.5) ~  pt is afebrile, will check Procalcitonin with low threshold to re-culture and initiate ABX 02/20/21: Palliative Care  consulted.  Family decided to pursue Compassionate Extubation and withdrawal of care with transition to [place].  Placed on Dilaudid infusion. Mar 15, 2021: Pt expired   Pertinent Labs and Studies  Significant Diagnostic Studies DG Chest 1 View  Result Date: 02/14/2021 CLINICAL DATA:  Hypoxia EXAM: CHEST  1 VIEW COMPARISON:  02/28/2021 FINDINGS: Single frontal view of the chest demonstrates a stable cardiac silhouette. There is marked progression of bilateral perihilar airspace disease, with likely development of small bilateral pleural effusions. No pneumothorax. No acute bony abnormalities. IMPRESSION: 1. Marked progression of bilateral airspace disease consistent with worsening infection or edema. 2. Small bilateral pleural effusions, increased since prior study. Electronically Signed   By: Randa Ngo M.D.   On: 02/14/2021 21:12   DG Chest 2 View  Result Date: 03/09/2021 CLINICAL DATA:  Hypotensive.  Evaluate infiltrate. EXAM: CHEST - 2 VIEW COMPARISON:  12/24/2019 FINDINGS: Lateral view degraded by patient arm position. Patient rotated minimally left on the frontal radiograph. Mild cardiomegaly. Atherosclerosis in the transverse aorta. Small left and probable small right pleural effusions. No pneumothorax. Bilateral, lower lung predominant interstitial and airspace disease. Relative sparing of the left upper lung. IMPRESSION: Development of multifocal interstitial and airspace disease with small left and probable small right pleural effusion. Favor multifocal pneumonia over pulmonary edema. Aortic Atherosclerosis (ICD10-I70.0). Electronically Signed   By: Abigail Miyamoto M.D.   On: 02/18/2021 13:19   DG Abd 1 View  Result Date: 02/18/2021 CLINICAL DATA:  OG tube placement. EXAM: ABDOMEN - 1 VIEW COMPARISON:  Earlier same day FINDINGS: 1210 hours. OG tube tip is positioned in the distal stomach. Bowel gas pattern is nonspecific. Airspace disease noted in both lung bases. IMPRESSION: OG tube tip  is positioned in the distal stomach. Electronically Signed   By: Misty Stanley M.D.   On: 02/18/2021 12:45   DG Abd 1 View  Result Date: 02/18/2021 CLINICAL DATA:  Check for aerophagia. Patient is placed on bipap and is complaining of abdominal pain and bloating. EXAM: ABDOMEN - 1 VIEW COMPARISON:  02/14/2021.  CT, 02/26/2019. FINDINGS: Paucity of bowel gas. No significant gastric air. No evidence of bowel dilation or gastric distension. Soft tissues unremarkable. No acute skeletal abnormality. IMPRESSION: 1. No acute findings. No evidence of bowel obstruction. No bowel or gastric distention. Electronically Signed   By: Lajean Manes M.D.   On: 02/18/2021 10:28   DG Abd 1 View  Result Date: 02/15/2021 CLINICAL DATA:  Intubation. EXAM: PORTABLE CHEST 1 VIEW COMPARISON:  Chest radiograph dated 02/14/2021. FINDINGS: Endotracheal tube with tip approximately 4.5 cm above the carina. Enteric tube with tip in the body of the stomach. Bilateral pulmonary opacities as seen previously. No pneumothorax. Stable cardiomediastinal silhouette. No acute osseous pathology. IMPRESSION: 1. Endotracheal tube with tip above the carina. Enteric tube with tip in the body of the stomach. 2. Bilateral pulmonary opacities as seen previously. Electronically Signed   By: Anner Crete M.D.   On: 02/15/2021 00:02   CT HEAD WO CONTRAST  Result Date: 03/08/2021 CLINICAL DATA:  Head trauma, moderate/severe. Neck trauma, focal neuro deficit or paresthesia. Additional history provided: Fall this morning (hitting head). Caregiver reports recent balance difficulty. EXAM: CT HEAD WITHOUT CONTRAST CT CERVICAL SPINE WITHOUT CONTRAST TECHNIQUE: Multidetector CT imaging of the head and cervical spine was performed following the standard protocol without intravenous contrast. Multiplanar CT image reconstructions of the cervical spine  were also generated. COMPARISON:  Prior head CT examinations 04/07/2020 and earlier. Radiographs of the cervical  spine 05/10/2009. FINDINGS: CT HEAD FINDINGS Brain: Mild cerebral atrophy. Nonspecific prominence of the atria and occipital horns of the lateral ventricles, stable as compared to the head CT of 04/07/2020. The there is no dilation of the temporal horns to suggest hydrocephalus. "Empty" appearance of the sella turcica, also unchanged. Mild patchy and ill-defined hypoattenuation within the cerebral white matter, nonspecific but compatible with chronic small vessel ischemic disease. There is no acute intracranial hemorrhage. No demarcated cortical infarct. No extra-axial fluid collection. No evidence of an intracranial mass. No midline shift. Vascular: No hyperdense vessel.  Atherosclerotic calcifications. Skull: Normal. Negative for fracture or focal lesion. Sinuses/Orbits: Visualized orbits show no acute finding. Unchanged partial opacification of the right ethmoid air cells with nonspecific polypoid soft tissue extending from this region inferiorly into the right nasal passage. Similar to the prior examination, there is a thinned appearance of the lateral lamella of the cribriform plate (series 4, image 18). Mild-to-moderate mucosal thickening within the visualized right maxillary sinus. Mild mucosal thickening within the visualized left maxillary sinus. CT CERVICAL SPINE FINDINGS Alignment: Straightening of the expected cervical lordosis. No significant spondylolisthesis. Skull base and vertebrae: The basion-dental and atlanto-dental intervals are maintained.No evidence of acute fracture to the cervical spine. Congenital nonunion of the posterior arch of C1. Soft tissues and spinal canal: No prevertebral fluid or swelling. No visible canal hematoma. Disc levels: No significant bony spinal canal or neural foraminal narrowing at any level. Upper chest: No consolidation within the imaged lung apices. No visible pneumothorax. Partially imaged right pleural effusion. IMPRESSION: CT head: 1. No evidence of acute  intracranial abnormality. 2. Mild chronic small vessel ischemic changes within the cerebral white matter. 3. Mild cerebral atrophy. 4. As before, there is partial opacification of the right ethmoid air cells with nonspecific polypoid soft tissue extending from this region into the right nasal passage. Associated thinned appearance of the lateral lamella of the cribriform plate on the right. Dehiscence at this site cannot be excluded, and a non-emergent maxillofacial CT is recommended for further evaluation. 5. Right greater than left maxillary sinus disease at the imaged levels, as described. CT cervical spine: 1. No evidence of acute fracture to the cervical spine. 2. Straightening of the expected cervical lordosis. 3. Partially imaged right pleural effusion. Electronically Signed   By: Kellie Simmering D.O.   On: 02/12/2021 13:20   CT Angio Chest Pulmonary Embolism (PE) W or WO Contrast  Result Date: 02/15/2021 CLINICAL DATA:  Worsening O2 sats, sepsis, pneumonia. EXAM: CT ANGIOGRAPHY CHEST WITH CONTRAST TECHNIQUE: Multidetector CT imaging of the chest was performed using the standard protocol during bolus administration of intravenous contrast. Multiplanar CT image reconstructions and MIPs were obtained to evaluate the vascular anatomy. CONTRAST:  72mL OMNIPAQUE IOHEXOL 350 MG/ML SOLN COMPARISON:  Same-day chest radiograph FINDINGS: Lines/tubes: The endotracheal tube terminates in the midthoracic trachea. The enteric catheter tip is off the field of view. Cardiovascular: There is adequate opacification of the pulmonary arteries to the segmental level. There is no evidence of pulmonary embolism. The heart is not enlarged. There is no pericardial effusion. Scattered coronary artery calcifications are noted. There is mild calcified atherosclerotic plaque of the aortic arch. The thoracic aorta is otherwise unremarkable. Mediastinum/Nodes: Thyroid is grossly unremarkable. Esophagus is grossly unremarkable. A calcified  subcarinal lymph node is noted. Calcified mediastinal and hilar lymph nodes are noted consistent with prior granulomatous disease. There is  no axillary lymphadenopathy. Lungs/Pleura: The trachea and main bronchi are patent. There are moderate-sized bilateral pleural effusions with consolidation throughout much of the bilateral lower lobes, left worse than right. Scattered additional patchy opacities are also seen in the bilateral upper lobes, right middle lobe, and lingula. There is no pneumothorax. Upper Abdomen: Bilateral renal cysts are noted. Musculoskeletal: There is no acute osseous abnormality or aggressive osseous lesion. Multiple remote rib fractures are noted. Mild compression deformity of the T4 through T7 vertebral bodies is similar to the prior CT from 2017. Review of the MIP images confirms the above findings. IMPRESSION: 1. No evidence of pulmonary embolism. 2. Near-complete consolidation in the bilateral lower lobes with scattered additional patchy opacities throughout the remainder of the lungs and moderate bilateral pleural effusions consistent with multifocal pneumonia. Aortic Atherosclerosis (ICD10-I70.0). Electronically Signed   By: Valetta Mole M.D.   On: 02/15/2021 09:16   CT Cervical Spine Wo Contrast  Result Date: 02/14/2021 CLINICAL DATA:  Head trauma, moderate/severe. Neck trauma, focal neuro deficit or paresthesia. Additional history provided: Fall this morning (hitting head). Caregiver reports recent balance difficulty. EXAM: CT HEAD WITHOUT CONTRAST CT CERVICAL SPINE WITHOUT CONTRAST TECHNIQUE: Multidetector CT imaging of the head and cervical spine was performed following the standard protocol without intravenous contrast. Multiplanar CT image reconstructions of the cervical spine were also generated. COMPARISON:  Prior head CT examinations 04/07/2020 and earlier. Radiographs of the cervical spine 05/10/2009. FINDINGS: CT HEAD FINDINGS Brain: Mild cerebral atrophy. Nonspecific  prominence of the atria and occipital horns of the lateral ventricles, stable as compared to the head CT of 04/07/2020. The there is no dilation of the temporal horns to suggest hydrocephalus. "Empty" appearance of the sella turcica, also unchanged. Mild patchy and ill-defined hypoattenuation within the cerebral white matter, nonspecific but compatible with chronic small vessel ischemic disease. There is no acute intracranial hemorrhage. No demarcated cortical infarct. No extra-axial fluid collection. No evidence of an intracranial mass. No midline shift. Vascular: No hyperdense vessel.  Atherosclerotic calcifications. Skull: Normal. Negative for fracture or focal lesion. Sinuses/Orbits: Visualized orbits show no acute finding. Unchanged partial opacification of the right ethmoid air cells with nonspecific polypoid soft tissue extending from this region inferiorly into the right nasal passage. Similar to the prior examination, there is a thinned appearance of the lateral lamella of the cribriform plate (series 4, image 18). Mild-to-moderate mucosal thickening within the visualized right maxillary sinus. Mild mucosal thickening within the visualized left maxillary sinus. CT CERVICAL SPINE FINDINGS Alignment: Straightening of the expected cervical lordosis. No significant spondylolisthesis. Skull base and vertebrae: The basion-dental and atlanto-dental intervals are maintained.No evidence of acute fracture to the cervical spine. Congenital nonunion of the posterior arch of C1. Soft tissues and spinal canal: No prevertebral fluid or swelling. No visible canal hematoma. Disc levels: No significant bony spinal canal or neural foraminal narrowing at any level. Upper chest: No consolidation within the imaged lung apices. No visible pneumothorax. Partially imaged right pleural effusion. IMPRESSION: CT head: 1. No evidence of acute intracranial abnormality. 2. Mild chronic small vessel ischemic changes within the cerebral  white matter. 3. Mild cerebral atrophy. 4. As before, there is partial opacification of the right ethmoid air cells with nonspecific polypoid soft tissue extending from this region into the right nasal passage. Associated thinned appearance of the lateral lamella of the cribriform plate on the right. Dehiscence at this site cannot be excluded, and a non-emergent maxillofacial CT is recommended for further evaluation. 5. Right greater than left  maxillary sinus disease at the imaged levels, as described. CT cervical spine: 1. No evidence of acute fracture to the cervical spine. 2. Straightening of the expected cervical lordosis. 3. Partially imaged right pleural effusion. Electronically Signed   By: Kellie Simmering D.O.   On: 02/28/2021 13:20   [place] CHEST (PLEURAL EFFUSION)  Result Date: 02/15/2021 CLINICAL DATA:  Evaluate pleural fluid EXAM: CHEST ULTRASOUND COMPARISON:  None. FINDINGS: Limited sonographic exam of the bilateral chest was performed for fluids survey. Images demonstrate trace bilateral pleural effusions. Fluid volume is insufficient for safe image guided thoracentesis. No thoracentesis performed. IMPRESSION: Trace bilateral pleural effusions, insufficient for safe image guided thoracentesis. No thoracentesis performed. Electronically Signed   By: Albin Felling M.D.   On: 02/15/2021 12:52   DG Chest Port 1 View  Result Date: 02/18/2021 CLINICAL DATA:  Intubation. EXAM: PORTABLE CHEST 1 VIEW COMPARISON:  Earlier same day FINDINGS: 1209 hours. Endotracheal tube tip is approximately 4.1 cm above the base of the carina. The NG tube passes into the stomach although the distal tip position is not included on the film. Right IJ central line tip overlies the mid SVC level. Asymmetric left greater than right airspace disease again noted, stable to minimally improved in the interval. Telemetry leads overlie the chest. IMPRESSION: 1. Endotracheal tube tip is approximately 4.1 cm above the base of the carina. 2.  Stable to minimally improved asymmetric left greater than right airspace disease. Electronically Signed   By: Misty Stanley M.D.   On: 02/18/2021 12:41   DG Chest Port 1 View  Result Date: 02/18/2021 CLINICAL DATA:  Shortness of breath.  Breast cancer. EXAM: PORTABLE CHEST 1 VIEW COMPARISON:  02/18/2021 FINDINGS: Diffuse bilateral airspace disease is similar to prior. Cardiopericardial silhouette is at upper limits of normal for size. Right IJ central line tip overlies the mid to distal SVC. Telemetry leads overlie the chest. IMPRESSION: Stable exam. Diffuse bilateral airspace disease compatible with diffuse infection. Asymmetric edema is a less likely consideration by imaging. Electronically Signed   By: Misty Stanley M.D.   On: 02/18/2021 09:46   DG Chest Port 1 View  Result Date: 02/18/2021 CLINICAL DATA:  Hypoxia. Patient laying supine and sideways in bed and unable to be moved. EXAM: PORTABLE CHEST 1 VIEW COMPARISON:  Prior chest x-ray yesterday 02/17/2021 FINDINGS: Stable position of right IJ central venous catheter with catheter tip in the mid SVC. Similar appearance of the lungs with diffuse bilateral interstitial airspace opacities more confluent in the bilateral upper lungs most consistent with multifocal pneumonia. Calcified mediastinal lymph nodes again noted. Trace calcifications in the thoracic aorta. The heart is normal in size. No pneumothorax. No acute osseous abnormality. IMPRESSION: Persistent extensive bilateral interstitial and patchy airspace opacities most confluent in the upper lungs concerning for multifocal pneumonia. Stable position of right IJ central venous catheter. Electronically Signed   By: Jacqulynn Cadet M.D.   On: 02/18/2021 06:38   DG Chest Port 1 View  Result Date: 02/17/2021 CLINICAL DATA:  Sepsis history of breast cancer EXAM: PORTABLE CHEST 1 VIEW COMPARISON:  02/16/2021, CT chest 02/15/2021, radiographs 02/14/2021, 02/26/2021 FINDINGS: Interval extubation and  removal of esophageal tube. Right IJ central venous catheter tip over the SVC. Extensive bilateral airspace disease with some improvement in aeration at the right base. Stable cardiomediastinal silhouette with aortic atherosclerosis. Small calcified mediastinal lymph nodes and hilar nodes. No pneumothorax. IMPRESSION: 1. Interval extubation and removal of esophageal tube. 2. Extensive bilateral airspace disease with some improvement in  aeration of right lower lobe. Airspace disease otherwise without significant change Electronically Signed   By: Donavan Foil M.D.   On: 02/17/2021 17:40   DG Chest Port 1 View  Result Date: 02/16/2021 CLINICAL DATA:  Central line placement. EXAM: PORTABLE CHEST 1 VIEW COMPARISON:  Chest radiograph 02/16/2021 at 4:35 a.m. FINDINGS: A new right jugular catheter terminates over the lower SVC. An endotracheal tube remains in place, terminating 2.5 cm above the carina. An enteric tube courses into the abdomen with tip not imaged. The cardiomediastinal silhouette is unchanged with normal heart size. Widespread airspace opacities throughout both lungs have not significantly changed. There are persistent bilateral pleural effusions, moderate in size by yesterday's CTA. No pneumothorax is identified. IMPRESSION: 1. New right jugular catheter terminating over the lower SVC. 2. Unchanged extensive bilateral airspace disease and bilateral pleural effusions. Electronically Signed   By: Logan Bores M.D.   On: 02/16/2021 12:47   DG Chest Port 1 View  Result Date: 02/16/2021 CLINICAL DATA:  Shortness of breath EXAM: PORTABLE CHEST 1 VIEW COMPARISON:  Previous studies including the examination of 02/14/2021. FINDINGS: Tip of endotracheal tube is 2.5 cm above the carina. Enteric tube is noted traversing the esophagus. Transverse diameter of heart is increased. Diffuse alveolar densities seen in both lungs more so on the left side with interval worsening. There is blunting of both lateral CP  angles. There is no definite demonstrable pneumothorax. IMPRESSION: Extensive infiltrates are seen in both lungs with interval worsening suggesting multifocal pneumonia. Part of this finding may suggest pulmonary edema. Bilateral pleural effusions. Electronically Signed   By: Elmer Picker M.D.   On: 02/16/2021 08:19   DG Chest Port 1 View  Result Date: 02/15/2021 CLINICAL DATA:  Intubation. EXAM: PORTABLE CHEST 1 VIEW COMPARISON:  Chest radiograph dated 02/14/2021. FINDINGS: Endotracheal tube with tip approximately 4.5 cm above the carina. Enteric tube with tip in the body of the stomach. Bilateral pulmonary opacities as seen previously. No pneumothorax. Stable cardiomediastinal silhouette. No acute osseous pathology. IMPRESSION: 1. Endotracheal tube with tip above the carina. Enteric tube with tip in the body of the stomach. 2. Bilateral pulmonary opacities as seen previously. Electronically Signed   By: Anner Crete M.D.   On: 02/15/2021 00:02   ECHOCARDIOGRAM COMPLETE  Result Date: 02/15/2021    ECHOCARDIOGRAM REPORT   Patient Name:   NAVI EWTON Date of Exam: 02/15/2021 Medical Rec #:  [id number]          Height:       59.0 in Accession #:    [id number]         Weight:       110.0 lb Date of Birth:  11-Jun-1960          BSA:          1.431 m Patient Age:    61 years           BP:           97/46 mmHg Patient Gender: F                  HR:           87 bpm. Exam Location:  [facility] Procedure: 2D Echo, Color Doppler and Cardiac Doppler Indications:     I50.21 congestive heart failure-Acute Systolic  History:         Patient has prior history of Echocardiogram examinations. Risk  Factors:Hypertension and Dyslipidemia.  Sonographer:     Charmayne Sheer Referring Phys:  [id number] Bradly Bienenstock Diagnosing Phys: Ida Rogue MD  Sonographer Comments: Echo performed with patient supine and on artificial respirator. IMPRESSIONS  1. Left ventricular ejection fraction, by estimation, is 40 to  45%. The left ventricle has mildly decreased function. The left ventricle demonstrates regional wall motion abnormalities (Severe hypokinesis of the apical and large peri-apical region involving distal anterior/inferior/septal/lateral walls). There is mild left ventricular hypertrophy. The average left ventricular global longitudinal strain is -13.3 %. The global longitudinal strain is abnormal.  2. Right ventricular systolic function is normal. The right ventricular size is normal.  3. The mitral valve is normal in structure. Mild mitral valve regurgitation. No evidence of mitral stenosis.  4. The aortic valve is normal in structure. Aortic valve regurgitation is mild. No aortic stenosis is present.  5. The inferior vena cava is normal in size with greater than 50% respiratory variability, suggesting right atrial pressure of 3 mmHg. FINDINGS  Left Ventricle: Left ventricular ejection fraction, by estimation, is 40 to 45%. The left ventricle has mildly decreased function. The left ventricle demonstrates regional wall motion abnormalities. The average left ventricular global longitudinal strain is -13.3 %. The global longitudinal strain is abnormal. The left ventricular internal cavity size was normal in size. There is mild left ventricular hypertrophy. Left ventricular diastolic parameters are indeterminate. Right Ventricle: The right ventricular size is normal. No increase in right ventricular wall thickness. Right ventricular systolic function is normal. Left Atrium: Left atrial size was normal in size. Right Atrium: Right atrial size was normal in size. Pericardium: There is no evidence of pericardial effusion. Mitral Valve: The mitral valve is normal in structure. Mild mitral valve regurgitation. No evidence of mitral valve stenosis. MV peak gradient, 13.0 mmHg. The mean mitral valve gradient is 4.0 mmHg. Tricuspid Valve: The tricuspid valve is normal in structure. Tricuspid valve regurgitation is mild . No  evidence of tricuspid stenosis. Aortic Valve: The aortic valve is normal in structure. Aortic valve regurgitation is mild. No aortic stenosis is present. Aortic valve mean gradient measures 12.0 mmHg. Aortic valve peak gradient measures 21.7 mmHg. Aortic valve area, by VTI measures 1.76 cm. Pulmonic Valve: The pulmonic valve was normal in structure. Pulmonic valve regurgitation is not visualized. No evidence of pulmonic stenosis. Aorta: The aortic root is normal in size and structure. Venous: The inferior vena cava is normal in size with greater than 50% respiratory variability, suggesting right atrial pressure of 3 mmHg. IAS/Shunts: No atrial level shunt detected by color flow Doppler.  LEFT VENTRICLE PLAX 2D LVIDd:         3.38 cm   Diastology LVIDs:         2.31 cm   LV e' medial:    6.96 cm/s LV PW:         0.80 cm   LV E/e' medial:  17.6 LV IVS:        0.73 cm   LV e' lateral:   8.70 cm/s LVOT diam:     1.60 cm   LV E/e' lateral: 14.1 LV SV:         79 LV SV Index:   55        2D Longitudinal Strain LVOT Area:     2.01 cm  2D Strain GLS Avg:     -13.3 %  RIGHT VENTRICLE RV Basal diam:  2.92 cm LEFT ATRIUM  Index        RIGHT ATRIUM           Index LA diam:      3.90 cm 2.73 cm/m   RA Area:     11.00 cm LA Vol (A2C): 23.4 ml 16.36 ml/m  RA Volume:   23.60 ml  16.50 ml/m LA Vol (A4C): 50.8 ml 35.51 ml/m  AORTIC VALVE                     PULMONIC VALVE AV Area (Vmax):    1.62 cm      PV Vmax:       1.01 m/s AV Area (Vmean):   1.85 cm      PV Vmean:      73.100 cm/s AV Area (VTI):     1.76 cm      PV VTI:        0.192 m AV Vmax:           233.00 cm/s   PV Peak grad:  4.1 mmHg AV Vmean:          162.000 cm/s  PV Mean grad:  2.0 mmHg AV VTI:            0.450 m AV Peak Grad:      21.7 mmHg AV Mean Grad:      12.0 mmHg LVOT Vmax:         188.00 cm/s LVOT Vmean:        149.000 cm/s LVOT VTI:          0.393 m LVOT/AV VTI ratio: 0.87  AORTA Ao Root diam: 2.50 cm MITRAL VALVE                TRICUSPID  VALVE MV Area (PHT): 3.11 cm     TR Peak grad:   37.7 mmHg MV Area VTI:   2.66 cm     TR Vmax:        307.00 cm/s MV Peak grad:  13.0 mmHg MV Mean grad:  4.0 mmHg     SHUNTS MV Vmax:       1.80 m/s     Systemic VTI:  0.39 m MV Vmean:      95.5 cm/s    Systemic Diam: 1.60 cm MV Decel Time: 244 msec MV E velocity: 122.33 cm/s Ida Rogue MD Electronically signed by Ida Rogue MD Signature Date/Time: 02/15/2021/6:47:38 PM    Final     Microbiology Recent Results (from the past 240 hour(s))  Urine Culture     Status: Abnormal   Collection Time: 02/16/2021 12:07 PM   Specimen: Urine, Clean Catch  Result Value Ref Range Status   Specimen Description   Final    URINE, CLEAN CATCH Performed at [facility], [address], [place], [place] [address]    Special Requests   Final    NONE Performed at [facility], [address] [place], [place] [address]    Culture >=100,000 COLONIES/mL ESCHERICHIA COLI (A)  Final   Report Status 02/16/2021 FINAL  Final   Organism ID, Bacteria ESCHERICHIA COLI (A)  Final      Susceptibility   Escherichia coli - MIC*    AMPICILLIN <=2 SENSITIVE Sensitive     CEFAZOLIN <=4 SENSITIVE Sensitive     CEFEPIME <=0.12 SENSITIVE Sensitive     CEFTRIAXONE <=0.25 SENSITIVE Sensitive     CIPROFLOXACIN <=0.25 SENSITIVE Sensitive     GENTAMICIN <=1 SENSITIVE Sensitive  IMIPENEM <=0.25 SENSITIVE Sensitive     NITROFURANTOIN <=16 SENSITIVE Sensitive     TRIMETH/SULFA <=20 SENSITIVE Sensitive     AMPICILLIN/SULBACTAM <=2 SENSITIVE Sensitive     PIP/TAZO <=4 SENSITIVE Sensitive     * >=100,000 COLONIES/mL ESCHERICHIA COLI  Culture, blood (routine x 2)     Status: None   Collection Time: 02/22/2021  5:04 PM   Specimen: BLOOD  Result Value Ref Range Status   Specimen Description BLOOD BLOOD LEFT FOREARM  Final   Special Requests   Final    BOTTLES DRAWN AEROBIC AND ANAEROBIC Blood Culture adequate volume   Culture   Final    NO GROWTH 5  DAYS Performed at [facility], [address] [place], [place] [address]    Report Status 02/18/2021 FINAL  Final  Culture, blood (routine x 2)     Status: None   Collection Time: 03/09/2021  5:04 PM   Specimen: BLOOD  Result Value Ref Range Status   Specimen Description BLOOD BLOOD RIGHT HAND  Final   Special Requests   Final    BOTTLES DRAWN AEROBIC AND ANAEROBIC Blood Culture adequate volume   Culture   Final    NO GROWTH 5 DAYS Performed at [facility], [address] [place], [place] [address]    Report Status 02/18/2021 FINAL  Final  Resp Panel by RT-PCR (Flu A&B, Covid) Nasopharyngeal Swab     Status: None   Collection Time: 02/23/2021  5:04 PM   Specimen: Nasopharyngeal Swab; Nasopharyngeal(NP) swabs in vial transport medium  Result Value Ref Range Status   SARS Coronavirus 2 by RT PCR NEGATIVE NEGATIVE Final    Comment: (NOTE) SARS-CoV-2 target nucleic acids are NOT DETECTED.  The SARS-CoV-2 RNA is generally detectable in upper respiratory specimens during the acute phase of infection. The lowest concentration of SARS-CoV-2 viral copies this assay can detect is 138 copies/mL. A negative result does not preclude SARS-Cov-2 infection and should not be used as the sole basis for treatment or other patient management decisions. A negative result may occur with  improper specimen collection/handling, submission of specimen other than nasopharyngeal swab, presence of viral mutation(s) within the areas targeted by this assay, and inadequate number of viral copies(<138 copies/mL). A negative result must be combined with clinical observations, patient history, and epidemiological information. The expected result is Negative.  Fact Sheet for Patients:  EntrepreneurPulse.com.au  Fact Sheet for Healthcare Providers:  IncredibleEmployment.be  This test is no t yet approved or cleared by the [place] FDA and  has been  authorized for detection and/or diagnosis of SARS-CoV-2 by FDA under an Emergency Use Authorization (EUA). This EUA will remain  in effect (meaning this test can be used) for the duration of the COVID-19 declaration under Section 564(b)(1) of the Act, 21 U.S.C.section 360bbb-3(b)(1), unless the authorization is terminated  or revoked sooner.       Influenza A by PCR NEGATIVE NEGATIVE Final   Influenza B by PCR NEGATIVE NEGATIVE Final    Comment: (NOTE) The Xpert Xpress SARS-CoV-2/FLU/RSV plus assay is intended as an aid in the diagnosis of influenza from Nasopharyngeal swab specimens and should not be used as a sole basis for treatment. Nasal washings and aspirates are unacceptable for Xpert Xpress SARS-CoV-2/FLU/RSV testing.  Fact Sheet for Patients: EntrepreneurPulse.com.au  Fact Sheet for Healthcare Providers: IncredibleEmployment.be  This test is not yet approved or cleared by the [place] FDA and has been authorized for detection and/or diagnosis of SARS-CoV-2 by  FDA under an Emergency Use Authorization (EUA). This EUA will remain in effect (meaning this test can be used) for the duration of the COVID-19 declaration under Section 564(b)(1) of the Act, 21 U.S.C. section 360bbb-3(b)(1), unless the authorization is terminated or revoked.  Performed at [facility], [address] [place], [place] [address]   MRSA Next Gen by PCR, Nasal     Status: None   Collection Time: 02/24/2021 10:45 PM   Specimen: Nasal Mucosa; Nasal Swab  Result Value Ref Range Status   MRSA by PCR Next Gen NOT DETECTED NOT DETECTED Final    Comment: (NOTE) The GeneXpert MRSA Assay (FDA approved for NASAL specimens only), is one component of a comprehensive MRSA colonization surveillance program. It is not intended to diagnose MRSA infection nor to guide or monitor treatment for MRSA infections. Test performance is not FDA approved in patients less  than 10 years old. Performed at [facility], [address] [place], [place] [address]   Culture, Respiratory w Gram Stain     Status: None   Collection Time: 02/15/21  2:41 AM   Specimen: Tracheal Aspirate; Respiratory  Result Value Ref Range Status   Specimen Description   Final    TRACHEAL ASPIRATE Performed at [facility], [address], [place], [place] [address]    Special Requests   Final    NONE Performed at [facility], [address] [place], [place] [address]    Gram Stain   Final    ABUNDANT WBC PRESENT,BOTH PMN AND MONONUCLEAR FEW GRAM NEGATIVE RODS    Culture   Final    RARE Normal respiratory flora-no Staph aureus or Pseudomonas seen Performed at [facility] [address], [place], [place] [address]    Report Status 02/17/2021 FINAL  Final  Respiratory (~20 pathogens) panel by PCR     Status: None   Collection Time: 02/15/21 10:30 AM   Specimen: Nasopharyngeal Swab; Respiratory  Result Value Ref Range Status   Adenovirus NOT DETECTED NOT DETECTED Final   Coronavirus 229E NOT DETECTED NOT DETECTED Final    Comment: (NOTE) The Coronavirus on the Respiratory Panel, DOES NOT test for the novel  Coronavirus (2019 nCoV)    Coronavirus HKU1 NOT DETECTED NOT DETECTED Final   Coronavirus NL63 NOT DETECTED NOT DETECTED Final   Coronavirus OC43 NOT DETECTED NOT DETECTED Final   Metapneumovirus NOT DETECTED NOT DETECTED Final   Rhinovirus / Enterovirus NOT DETECTED NOT DETECTED Final   Influenza A NOT DETECTED NOT DETECTED Final   Influenza B NOT DETECTED NOT DETECTED Final   Parainfluenza Virus 1 NOT DETECTED NOT DETECTED Final   Parainfluenza Virus 2 NOT DETECTED NOT DETECTED Final   Parainfluenza Virus 3 NOT DETECTED NOT DETECTED Final   Parainfluenza Virus 4 NOT DETECTED NOT DETECTED Final   Respiratory Syncytial Virus NOT DETECTED NOT DETECTED Final   Bordetella pertussis NOT DETECTED NOT DETECTED Final   Bordetella  Parapertussis NOT DETECTED NOT DETECTED Final   Chlamydophila pneumoniae NOT DETECTED NOT DETECTED Final   Mycoplasma pneumoniae NOT DETECTED NOT DETECTED Final    Comment: Performed at [facility], [place], [name], [place] [address]    Lab Basic Metabolic Panel: Recent Labs  Lab 02/17/21 0440 02/17/21 1422 02/17/21 2055 02/18/21 0332 02/19/21 0400 02/19/21 1030 02/20/21 0450  NA 140 145 145 146* 143  --  146*  K 4.5 2.9* 3.5 4.6 2.3* 3.0* 2.5*  CL 102 94*  97* 100 91*  --  94*  CO2 32 38* 36* 36* 41*  --  37*  GLUCOSE 146* 132* 173* 157* 262*  --  198*  BUN 29* 30* 29* 30* 28*  --  43*  CREATININE 0.71 0.66 0.65 0.56 0.78  --  0.85  CALCIUM 8.9 9.4 8.6* 8.1* 8.6*  --  8.8*  MG 2.4 1.8  --  2.5* 1.9  --  2.1  PHOS 3.4 3.7 3.1 2.6 2.4*  --  4.6   Liver Function Tests: Recent Labs  Lab 02/15/21 1154 02/17/21 1422 02/17/21 2055  AST  --  16 18  ALT  --  13 12  ALKPHOS  --  76 58  BILITOT  --  1.0 1.1  PROT 5.8* 7.3 5.9*  ALBUMIN  --  3.5 2.9*   No results for input(s): LIPASE, AMYLASE in the last 168 hours. No results for input(s): AMMONIA in the last 168 hours. CBC: Recent Labs  Lab 02/16/21 0545 02/17/21 0440 02/18/21 0332 02/19/21 0400 02/20/21 0450  WBC 18.5* 13.6* 8.6 10.5 17.2*  HGB 8.6* 8.7* 8.5* 7.6* 7.8*  HCT 27.3* 27.6* 27.2* 23.7* 24.2*  MCV 94.5 94.2 92.8 91.5 90.6  PLT 344 362 330 278 274   Cardiac Enzymes: No results for input(s): CKTOTAL, CKMB, CKMBINDEX, TROPONINI in the last 168 hours. Sepsis Labs: Recent Labs  Lab 02/17/21 0440 02/18/21 0332 02/19/21 0400 02/20/21 0450  WBC 13.6* 8.6 10.5 17.2*    Procedures/Operations  1/4: intubation (extubated 1/7) 1/6: Central Line placement (right IJ) 1/8: Reintubation      Darel Hong, AGACNP-BC [facility] Prefer epic messenger for cross cover needs If after hours, please call E-link  Bradly Bienenstock Mar 20, 2021, 11:13 AM

## 2021-03-14 DEATH — deceased

## 2021-03-21 ENCOUNTER — Ambulatory Visit: Payer: Medicare Other | Admitting: Family Medicine
